# Patient Record
Sex: Female | Born: 1945 | Race: Black or African American | Hispanic: No | Marital: Married | Smoking: Never smoker
Health system: Southern US, Community
[De-identification: ages and names within clinical notes are randomized; demographics above are authoritative.]

## PROBLEM LIST (undated history)

## (undated) DIAGNOSIS — E7800 Pure hypercholesterolemia, unspecified: Secondary | ICD-10-CM

## (undated) DIAGNOSIS — R569 Unspecified convulsions: Secondary | ICD-10-CM

## (undated) DIAGNOSIS — M1990 Unspecified osteoarthritis, unspecified site: Secondary | ICD-10-CM

## (undated) DIAGNOSIS — H269 Unspecified cataract: Secondary | ICD-10-CM

## (undated) DIAGNOSIS — I639 Cerebral infarction, unspecified: Secondary | ICD-10-CM

## (undated) DIAGNOSIS — E119 Type 2 diabetes mellitus without complications: Secondary | ICD-10-CM

## (undated) DIAGNOSIS — I10 Essential (primary) hypertension: Secondary | ICD-10-CM

## (undated) DIAGNOSIS — C801 Malignant (primary) neoplasm, unspecified: Secondary | ICD-10-CM

## (undated) DIAGNOSIS — D649 Anemia, unspecified: Secondary | ICD-10-CM

## (undated) DIAGNOSIS — E559 Vitamin D deficiency, unspecified: Secondary | ICD-10-CM

## (undated) DIAGNOSIS — G4730 Sleep apnea, unspecified: Secondary | ICD-10-CM

## (undated) DIAGNOSIS — R0600 Dyspnea, unspecified: Secondary | ICD-10-CM

## (undated) DIAGNOSIS — J45909 Unspecified asthma, uncomplicated: Secondary | ICD-10-CM

## (undated) DIAGNOSIS — I219 Acute myocardial infarction, unspecified: Secondary | ICD-10-CM

## (undated) DIAGNOSIS — E039 Hypothyroidism, unspecified: Secondary | ICD-10-CM

## (undated) DIAGNOSIS — T7840XA Allergy, unspecified, initial encounter: Secondary | ICD-10-CM

## (undated) DIAGNOSIS — F32A Depression, unspecified: Secondary | ICD-10-CM

## (undated) DIAGNOSIS — F419 Anxiety disorder, unspecified: Secondary | ICD-10-CM

## (undated) HISTORY — DX: Anxiety disorder, unspecified: F41.9

## (undated) HISTORY — DX: Unspecified cataract: H26.9

## (undated) HISTORY — DX: Cerebral infarction, unspecified: I63.9

## (undated) HISTORY — DX: Vitamin D deficiency, unspecified: E55.9

## (undated) HISTORY — DX: Unspecified osteoarthritis, unspecified site: M19.90

## (undated) HISTORY — DX: Sleep apnea, unspecified: G47.30

## (undated) HISTORY — DX: Allergy, unspecified, initial encounter: T78.40XA

## (undated) HISTORY — DX: Depression, unspecified: F32.A

## (undated) HISTORY — DX: Unspecified convulsions: R56.9

## (undated) HISTORY — DX: Pure hypercholesterolemia, unspecified: E78.00

## (undated) HISTORY — PX: HERNIA REPAIR: SHX51

## (undated) HISTORY — PX: ABDOMINAL HYSTERECTOMY: SUR658

## (undated) HISTORY — DX: Essential (primary) hypertension: I10

## (undated) HISTORY — DX: Malignant (primary) neoplasm, unspecified: C80.1

## (undated) HISTORY — DX: Anemia, unspecified: D64.9

## (undated) HISTORY — DX: Acute myocardial infarction, unspecified: I21.9

## (undated) HISTORY — PX: BARIATRIC SURGERY: SHX1103

## (undated) HISTORY — PX: BREAST BIOPSY: SHX20

## (undated) HISTORY — DX: Type 2 diabetes mellitus without complications: E11.9

---

## 1988-05-01 DIAGNOSIS — C801 Malignant (primary) neoplasm, unspecified: Secondary | ICD-10-CM

## 1988-05-01 HISTORY — DX: Malignant (primary) neoplasm, unspecified: C80.1

## 2001-05-08 DIAGNOSIS — F32A Depression, unspecified: Secondary | ICD-10-CM | POA: Insufficient documentation

## 2003-04-28 DIAGNOSIS — E119 Type 2 diabetes mellitus without complications: Secondary | ICD-10-CM | POA: Insufficient documentation

## 2003-05-02 DIAGNOSIS — I219 Acute myocardial infarction, unspecified: Secondary | ICD-10-CM

## 2003-05-02 HISTORY — DX: Acute myocardial infarction, unspecified: I21.9

## 2005-05-01 DIAGNOSIS — I639 Cerebral infarction, unspecified: Secondary | ICD-10-CM

## 2005-05-01 HISTORY — DX: Cerebral infarction, unspecified: I63.9

## 2007-03-20 DIAGNOSIS — I079 Rheumatic tricuspid valve disease, unspecified: Secondary | ICD-10-CM | POA: Insufficient documentation

## 2007-03-20 DIAGNOSIS — I5189 Other ill-defined heart diseases: Secondary | ICD-10-CM | POA: Insufficient documentation

## 2015-06-22 DIAGNOSIS — E2839 Other primary ovarian failure: Secondary | ICD-10-CM | POA: Diagnosis not present

## 2015-06-24 DIAGNOSIS — R0602 Shortness of breath: Secondary | ICD-10-CM | POA: Diagnosis not present

## 2015-06-24 DIAGNOSIS — I10 Essential (primary) hypertension: Secondary | ICD-10-CM | POA: Diagnosis not present

## 2015-06-24 DIAGNOSIS — R9431 Abnormal electrocardiogram [ECG] [EKG]: Secondary | ICD-10-CM | POA: Diagnosis not present

## 2015-06-29 DIAGNOSIS — R921 Mammographic calcification found on diagnostic imaging of breast: Secondary | ICD-10-CM | POA: Diagnosis not present

## 2015-06-29 DIAGNOSIS — Z1231 Encounter for screening mammogram for malignant neoplasm of breast: Secondary | ICD-10-CM | POA: Diagnosis not present

## 2015-07-05 DIAGNOSIS — E119 Type 2 diabetes mellitus without complications: Secondary | ICD-10-CM | POA: Diagnosis not present

## 2015-07-05 DIAGNOSIS — I639 Cerebral infarction, unspecified: Secondary | ICD-10-CM | POA: Diagnosis not present

## 2015-07-05 DIAGNOSIS — I10 Essential (primary) hypertension: Secondary | ICD-10-CM | POA: Diagnosis not present

## 2015-07-07 DIAGNOSIS — R921 Mammographic calcification found on diagnostic imaging of breast: Secondary | ICD-10-CM | POA: Diagnosis not present

## 2015-07-15 DIAGNOSIS — E1165 Type 2 diabetes mellitus with hyperglycemia: Secondary | ICD-10-CM | POA: Diagnosis not present

## 2015-07-15 DIAGNOSIS — G4730 Sleep apnea, unspecified: Secondary | ICD-10-CM | POA: Diagnosis not present

## 2015-07-15 DIAGNOSIS — N183 Chronic kidney disease, stage 3 (moderate): Secondary | ICD-10-CM | POA: Diagnosis not present

## 2015-07-15 DIAGNOSIS — I10 Essential (primary) hypertension: Secondary | ICD-10-CM | POA: Diagnosis not present

## 2015-10-21 DIAGNOSIS — E1165 Type 2 diabetes mellitus with hyperglycemia: Secondary | ICD-10-CM | POA: Diagnosis not present

## 2015-10-21 DIAGNOSIS — G4730 Sleep apnea, unspecified: Secondary | ICD-10-CM | POA: Diagnosis not present

## 2015-10-21 DIAGNOSIS — Z299 Encounter for prophylactic measures, unspecified: Secondary | ICD-10-CM | POA: Diagnosis not present

## 2015-10-21 DIAGNOSIS — I639 Cerebral infarction, unspecified: Secondary | ICD-10-CM | POA: Diagnosis not present

## 2015-10-21 DIAGNOSIS — E785 Hyperlipidemia, unspecified: Secondary | ICD-10-CM | POA: Diagnosis not present

## 2015-10-28 DIAGNOSIS — E119 Type 2 diabetes mellitus without complications: Secondary | ICD-10-CM | POA: Diagnosis not present

## 2015-10-28 DIAGNOSIS — I10 Essential (primary) hypertension: Secondary | ICD-10-CM | POA: Diagnosis not present

## 2015-10-28 DIAGNOSIS — I639 Cerebral infarction, unspecified: Secondary | ICD-10-CM | POA: Diagnosis not present

## 2015-11-08 DIAGNOSIS — E119 Type 2 diabetes mellitus without complications: Secondary | ICD-10-CM | POA: Diagnosis not present

## 2015-11-08 DIAGNOSIS — I639 Cerebral infarction, unspecified: Secondary | ICD-10-CM | POA: Diagnosis not present

## 2015-11-08 DIAGNOSIS — I10 Essential (primary) hypertension: Secondary | ICD-10-CM | POA: Diagnosis not present

## 2015-11-10 ENCOUNTER — Encounter: Payer: Self-pay | Admitting: *Deleted

## 2015-11-11 ENCOUNTER — Encounter: Payer: Self-pay | Admitting: *Deleted

## 2015-11-11 ENCOUNTER — Encounter: Payer: Self-pay | Admitting: Cardiology

## 2015-11-11 ENCOUNTER — Ambulatory Visit (INDEPENDENT_AMBULATORY_CARE_PROVIDER_SITE_OTHER): Payer: Medicare Other | Admitting: Cardiology

## 2015-11-11 VITALS — BP 103/71 | HR 77 | Ht 60.0 in | Wt 228.0 lb

## 2015-11-11 DIAGNOSIS — R0789 Other chest pain: Secondary | ICD-10-CM

## 2015-11-11 DIAGNOSIS — R0602 Shortness of breath: Secondary | ICD-10-CM | POA: Diagnosis not present

## 2015-11-11 DIAGNOSIS — Z136 Encounter for screening for cardiovascular disorders: Secondary | ICD-10-CM | POA: Diagnosis not present

## 2015-11-11 NOTE — Patient Instructions (Signed)
Your physician recommends that you schedule a follow-up appointment in: 6 WEEKS WITH DR BRANCH  Your physician recommends that you continue on your current medications as directed. Please refer to the Current Medication list given to you today.  Your physician has requested that you have an echocardiogram. Echocardiography is a painless test that uses sound waves to create images of your heart. It provides your doctor with information about the size and shape of your heart and how well your heart's chambers and valves are working. This procedure takes approximately one hour. There are no restrictions for this procedure.  Thank you for choosing  [facility]!!   

## 2015-11-11 NOTE — Progress Notes (Signed)
Clinical Summary Jessica Strong is a 70 y.o.female seen today as a new patient, previously seen by [facility] Card[facility] Dr Hamilton Capri.   1. SOB - SOB x 6 months. DOE walking room to room at home. Denies any LE edema.  - can have some occasional chest pain. Started about 6 months ago. Occurs with exertion, aching pain 4/10. Better with rest. Not positional. Increase in frequency since onset, no increase in severity.  - secondhand smoke exposure with husband. +cough at times. Occasional wheezing. Never had PFTs. - echo 12/2012 [facility]: technically difficult. LVEF 55-60%, abnormal diastolic function.  - negative exercise cardiolite in 2016 CAD risk factors: DM2, hyperlipidemia, HTN.       Past Medical History  Diagnosis Date  . Vitamin D deficiency   . Hypertension   . Hypercholesterolemia   . Stroke ([place])   . Sleep apnea     on CPAP 14 mm PS since February 2017     Allergies  Allergen Reactions  . Penicillins Rash     Current Outpatient Prescriptions  Medication Sig Dispense Refill  . clopidogrel (PLAVIX) 75 MG tablet Take 75 mg by mouth daily.    [name] donepezil (ARICEPT) 10 MG tablet Take 10 mg by mouth at bedtime.    . Dulaglutide (TRULICITY) A999333 0000000 SOPN Inject 0.5 mLs into the skin once a week.    . Empagliflozin-Linagliptin (GLYXAMBI) 10-5 MG TABS Take 1 tablet by mouth daily.    [name] EPINEPHrine (EPIPEN 2-PAK) 0.3 mg/0.3 mL IJ SOAJ injection Inject 0.3 mg into the muscle once.    . ergocalciferol (VITAMIN D2) 50000 units capsule Take 50,000 Units by mouth once a week.    . ferrous sulfate 325 (65 FE) MG tablet Take 325 mg by mouth daily with breakfast.    . glimepiride (AMARYL) 4 MG tablet Take 4 mg by mouth 2 (two) times daily.    [name] lisinopril-hydrochlorothiazide (PRINZIDE,ZESTORETIC) 20-12.5 MG tablet Take 1 tablet by mouth daily.    . simvastatin (ZOCOR) 20 MG tablet Take 20 mg by mouth daily.     No current facility-administered medications for this  visit.     No past surgical history on file.   Allergies  Allergen Reactions  . Penicillins Rash      Family History  Problem Relation Age of Onset  . Thyroid cancer Father   . Diabetes Mellitus II Mother      Social History Jessica Strong reports that she has never smoked. She does not have any smokeless tobacco history on file. Jessica Strong has no alcohol history on file.   Review of Systems CONSTITUTIONAL: No weight loss, fever, chills, weakness or fatigue.  HEENT: Eyes: No visual loss, blurred vision, double vision or yellow sclerae.No hearing loss, sneezing, congestion, runny nose or sore throat.  SKIN: No rash or itching.  CARDIOVASCULAR: per HPI RESPIRATORY: No shortness of breath, cough or sputum.  GASTROINTESTINAL: No anorexia, nausea, vomiting or diarrhea. No abdominal pain or blood.  GENITOURINARY: No burning on urination, no polyuria NEUROLOGICAL: No headache, dizziness, syncope, paralysis, ataxia, numbness or tingling in the extremities. No change in bowel or bladder control.  MUSCULOSKELETAL: No muscle, back pain, joint pain or stiffness.  LYMPHATICS: No enlarged nodes. No history of splenectomy.  PSYCHIATRIC: No history of depression or anxiety.  ENDOCRINOLOGIC: No reports of sweating, cold or heat intolerance. No polyuria or polydipsia.  [name]   Physical Examination Filed Vitals:   11/11/15 1441  BP: 103/71  Pulse: 77  Filed Vitals:   11/11/15 1441  Height: 5' (1.524 m)  Weight: 228 lb (103.42 kg)    Gen: resting comfortably, no acute distress HEENT: no scleral icterus, pupils equal round and reactive, no palptable cervical adenopathy,  CV: RRR, no m/r/g, no jvd Resp: Clear to auscultation bilaterally GI: abdomen is soft, non-tender, non-distended, normal bowel sounds, no hepatosplenomegaly MSK: extremities are warm, no edema.  Skin: warm, no rash Neuro:  no focal deficits Psych: appropriate affect      Assessment and Plan  1. Chest pain/SOB -  recent chest pain and SOB - we will repeat echo, last study 3 years ago. Recent negative nuclear stress test - pending echo and progression of symptoms, consider LHC/RHC for ongoing symptoms of chest pain and SOB        [name], M.D.

## 2015-11-13 ENCOUNTER — Encounter: Payer: Self-pay | Admitting: Cardiology

## 2015-11-18 ENCOUNTER — Other Ambulatory Visit: Payer: Self-pay

## 2015-11-18 ENCOUNTER — Ambulatory Visit (INDEPENDENT_AMBULATORY_CARE_PROVIDER_SITE_OTHER): Payer: Medicare Other

## 2015-11-18 DIAGNOSIS — R0602 Shortness of breath: Secondary | ICD-10-CM | POA: Diagnosis not present

## 2015-11-23 ENCOUNTER — Other Ambulatory Visit: Payer: Self-pay | Admitting: *Deleted

## 2015-11-23 ENCOUNTER — Telehealth: Payer: Self-pay | Admitting: *Deleted

## 2015-11-23 DIAGNOSIS — R0602 Shortness of breath: Secondary | ICD-10-CM

## 2015-11-23 NOTE — Telephone Encounter (Signed)
Pt aware. Hasn't had PFT's. Orders placed and will forward to be scheduled.

## 2015-11-23 NOTE — Telephone Encounter (Signed)
-----   Message from [name], MD sent at 11/19/2015  1:50 PM EDT ----- Echo looks good, nothing from a heart standpoint to explain her SOB. We will discuss in detail at our follow up. Has she ever had PFTs before? If not please order, this will check and see if her lungs could be the cause of her symptoms.   Zandra Abts MD

## 2015-12-03 ENCOUNTER — Encounter (HOSPITAL_COMMUNITY): Payer: Medicare Other

## 2015-12-10 ENCOUNTER — Ambulatory Visit (HOSPITAL_COMMUNITY)
Admission: RE | Admit: 2015-12-10 | Discharge: 2015-12-10 | Disposition: A | Discharge status: Home / Self Care | Payer: Medicare Other | Source: Ambulatory Visit | Attending: Cardiology | Admitting: Cardiology

## 2015-12-10 DIAGNOSIS — I639 Cerebral infarction, unspecified: Secondary | ICD-10-CM | POA: Diagnosis not present

## 2015-12-10 DIAGNOSIS — R0602 Shortness of breath: Secondary | ICD-10-CM | POA: Diagnosis not present

## 2015-12-10 DIAGNOSIS — E119 Type 2 diabetes mellitus without complications: Secondary | ICD-10-CM | POA: Diagnosis not present

## 2015-12-10 DIAGNOSIS — I10 Essential (primary) hypertension: Secondary | ICD-10-CM | POA: Diagnosis not present

## 2015-12-10 MED ORDER — ALBUTEROL SULFATE (2.5 MG/3ML) 0.083% IN NEBU
2.5000 mg | INHALATION_SOLUTION | Freq: Once | RESPIRATORY_TRACT | Status: AC
  Administered 2015-12-10: 2.5 mg via RESPIRATORY_TRACT

## 2015-12-11 LAB — PULMONARY FUNCTION TEST
DL/VA % PRED: 100 %
DL/VA: 4.28 ml/min/mmHg/L
DLCO unc % pred: 80 %
DLCO unc: 15.17 ml/min/mmHg
FEF 25-75 Post: 2.87 L/sec
FEF 25-75 Pre: 2.34 L/sec
FEF2575-%CHANGE-POST: 23 %
FEF2575-%PRED-PRE: 158 %
FEF2575-%Pred-Post: 195 %
FEV1-%CHANGE-POST: 5 %
FEV1-%Pred-Post: 141 %
FEV1-%Pred-Pre: 134 %
FEV1-PRE: 2.05 L
FEV1-Post: 2.17 L
FEV1FVC-%CHANGE-POST: 2 %
FEV1FVC-%Pred-Pre: 107 %
FEV6-%Change-Post: 3 %
FEV6-%PRED-PRE: 129 %
FEV6-%Pred-Post: 133 %
FEV6-PRE: 2.44 L
FEV6-Post: 2.53 L
FEV6FVC-%Change-Post: 0 %
FEV6FVC-%Pred-Post: 105 %
FEV6FVC-%Pred-Pre: 105 %
FVC-%CHANGE-POST: 3 %
FVC-%PRED-PRE: 123 %
FVC-%Pred-Post: 127 %
FVC-POST: 2.53 L
FVC-PRE: 2.44 L
POST FEV6/FVC RATIO: 100 %
PRE FEV1/FVC RATIO: 84 %
PRE FEV6/FVC RATIO: 100 %
Post FEV1/FVC ratio: 86 %
RV % pred: 78 %
RV: 1.53 L
TLC % pred: 92 %
TLC: 4.13 L

## 2015-12-14 ENCOUNTER — Telehealth: Payer: Self-pay | Admitting: *Deleted

## 2015-12-14 NOTE — Telephone Encounter (Signed)
Pt aware, confirmed 8/22 appt. Routed to pcp

## 2015-12-14 NOTE — Telephone Encounter (Signed)
-----   Message from [name], MD sent at 12/13/2015 10:36 AM EDT ----- Breathing tests look good. We will discuss at our upcoming f/u in detail   Zandra Abts MD

## 2015-12-21 ENCOUNTER — Ambulatory Visit (INDEPENDENT_AMBULATORY_CARE_PROVIDER_SITE_OTHER): Payer: Medicare Other | Admitting: Cardiology

## 2015-12-21 ENCOUNTER — Encounter: Payer: Self-pay | Admitting: Cardiology

## 2015-12-21 ENCOUNTER — Telehealth: Payer: Self-pay | Admitting: Cardiology

## 2015-12-21 ENCOUNTER — Encounter: Payer: Self-pay | Admitting: *Deleted

## 2015-12-21 VITALS — BP 109/72 | HR 65 | Ht 60.0 in | Wt 224.8 lb

## 2015-12-21 DIAGNOSIS — R0789 Other chest pain: Secondary | ICD-10-CM | POA: Diagnosis not present

## 2015-12-21 DIAGNOSIS — R0602 Shortness of breath: Secondary | ICD-10-CM | POA: Diagnosis not present

## 2015-12-21 NOTE — Telephone Encounter (Signed)
[facility] 8/25 @ 10:30 VARANASI  Checking percert

## 2015-12-21 NOTE — Progress Notes (Signed)
Clinical Summary Jessica Strong is a 70 y.o.female seen today for follow up of the following medical problems.   1. SOB - SOB x 6 months. DOE walking room to room at home. Denies any LE edema.  - can have some occasional chest pain. Started about 6 months ago. Occurs with exertion, aching pain 4/10. Better with rest. Not positional. Increase in frequency since onset, no increase in severity.  - secondhand smoke exposure with husband. +cough at times. Occasional wheezing. Since last visit she had normal PFTs.  - echo 12/2012 [facility]: technically difficult. LVEF 55-60%, abnormal diastolic function.  - negative exercise cardiolite in 2016 - 10/2015 echo LVEF 123456, grade I diastolic dysfunction CAD risk factors: DM2, hyperlipidemia, HTN.   - symptoms unchanged since last visit.    Past Medical History:  Diagnosis Date  . Hypercholesterolemia   . Hypertension   . Sleep apnea    on CPAP 14 mm PS since February 2017  . Stroke ([place])   . Vitamin D deficiency      Allergies  Allergen Reactions  . Penicillins Rash     Current Outpatient Prescriptions  Medication Sig Dispense Refill  . clopidogrel (PLAVIX) 75 MG tablet Take 75 mg by mouth daily.    [name] donepezil (ARICEPT) 10 MG tablet Take 10 mg by mouth at bedtime.    . Dulaglutide (TRULICITY) A999333 0000000 SOPN Inject 0.5 mLs into the skin once a week.    . Empagliflozin-Linagliptin (GLYXAMBI) 10-5 MG TABS Take 1 tablet by mouth daily.    [name] EPINEPHrine (EPIPEN 2-PAK) 0.3 mg/0.3 mL IJ SOAJ injection Inject 0.3 mg into the muscle once.    . ergocalciferol (VITAMIN D2) 50000 units capsule Take 50,000 Units by mouth once a week.    . ferrous sulfate 325 (65 FE) MG tablet Take 325 mg by mouth daily with breakfast.    . glimepiride (AMARYL) 4 MG tablet Take 4 mg by mouth 2 (two) times daily.    [name] lisinopril-hydrochlorothiazide (PRINZIDE,ZESTORETIC) 20-12.5 MG tablet Take 1 tablet by mouth daily.    . metFORMIN (GLUCOPHAGE)  1000 MG tablet Take 1 tablet by mouth 2 (two) times daily.    . Potassium 95 MG TABS Take 1 tablet by mouth daily.    . simvastatin (ZOCOR) 20 MG tablet Take 20 mg by mouth daily.     No current facility-administered medications for this visit.      No past surgical history on file.   Allergies  Allergen Reactions  . Penicillins Rash      Family History  Problem Relation Age of Onset  . Thyroid cancer Father   . Diabetes Mellitus II Mother      Social History Ms. Burnes reports that she has never smoked. She has never used smokeless tobacco. Ms. Wolverton has no alcohol history on file.   Review of Systems CONSTITUTIONAL: No weight loss, fever, chills, weakness or fatigue.  HEENT: Eyes: No visual loss, blurred vision, double vision or yellow sclerae.No hearing loss, sneezing, congestion, runny nose or sore throat.  SKIN: No rash or itching.  CARDIOVASCULAR: per HPI RESPIRATORY: per HPI GASTROINTESTINAL: No anorexia, nausea, vomiting or diarrhea. No abdominal pain or blood.  GENITOURINARY: No burning on urination, no polyuria NEUROLOGICAL: No headache, dizziness, syncope, paralysis, ataxia, numbness or tingling in the extremities. No change in bowel or bladder control.  MUSCULOSKELETAL: No muscle, back pain, joint pain or stiffness.  LYMPHATICS: No enlarged nodes. No history of splenectomy.  PSYCHIATRIC:  No history of depression or anxiety.  ENDOCRINOLOGIC: No reports of sweating, cold or heat intolerance. No polyuria or polydipsia.  [name]   Physical Examination Vitals:   12/21/15 1458  BP: 109/72  Pulse: 65   Vitals:   12/21/15 1458  Weight: 224 lb 12.8 oz (102 kg)  Height: 5' (1.524 m)    Gen: resting comfortably, no acute distress HEENT: no scleral icterus, pupils equal round and reactive, no palptable cervical adenopathy,  CV: RRR, no m/r/g, no jvd Resp: Clear to auscultation bilaterally GI: abdomen is soft, non-tender, non-distended, normal bowel sounds, no  hepatosplenomegaly MSK: extremities are warm, no edema.  Skin: warm, no rash Neuro:  no focal deficits Psych: appropriate affect   Diagnostic Studies 10/2015 echo Study Conclusions  - Left ventricle: The cavity size was normal. Systolic function was   normal. The estimated ejection fraction was in the range of 60%   to 65%. Mild concentric and moderate focal basal septal   hypertrophy. Wall motion was normal; there were no regional wall   motion abnormalities. Doppler parameters are consistent with   abnormal left ventricular relaxation (grade 1 diastolic   dysfunction). - Aortic valve: Trileaflet; mildly thickened leaflets.    Assessment and Plan   1. Chest pain/SOB - significant progressing symptoms. No clear etiology based on previous echos, nuclear stress test. Recent normal PFTs.  - given her ongoing symptoms and multiple CAD risk factors, we will arrange a definitive LHC to evaluate for CAD as the cause of her chest pain, and also ask for a RHC to evaluate filling pressures to evaluate for cardiac etiology of her SOB   F/u 6 weeks.    [name], M.D.   I have reviewed the risks, indications, and alternatives to cardiac catheterization, possible angioplasty, and stenting with the patient and her daughter today. Risks include but are not limited to bleeding, infection, vascular injury, stroke, myocardial infection, arrhythmia, kidney injury, radiation-related injury in the case of prolonged fluoroscopy use, emergency cardiac surgery, and death. The patient understands the risks of serious complication is 1-2 in 123XX123 with diagnostic cardiac cath and 1-2% or less with angioplasty/stenting.

## 2015-12-21 NOTE — Patient Instructions (Addendum)
Your physician recommends that you schedule a follow-up appointment in: 6 WEEKS WITH DR. BRANCH  Your physician recommends that you continue on your current medications as directed. Please refer to the Current Medication list given to you today.  Your physician has requested that you have a cardiac catheterization. Cardiac catheterization is used to diagnose and/or treat various heart conditions. Doctors may recommend this procedure for a number of different reasons. The most common reason is to evaluate chest pain. Chest pain can be a symptom of coronary artery disease (CAD), and cardiac catheterization can show whether plaque is narrowing or blocking your heart's arteries. This procedure is also used to evaluate the valves, as well as measure the blood flow and oxygen levels in different parts of your heart. For further information please visit www.cardiosmart.org. Please follow instruction sheet, as given.  Thank you for choosing [facility]!!    

## 2015-12-24 ENCOUNTER — Encounter (HOSPITAL_COMMUNITY)
Admission: RE | Disposition: A | Discharge status: Home / Self Care | Payer: Self-pay | Source: Ambulatory Visit | Attending: Interventional Cardiology

## 2015-12-24 ENCOUNTER — Ambulatory Visit (HOSPITAL_COMMUNITY)
Admission: RE | Admit: 2015-12-24 | Discharge: 2015-12-24 | Disposition: A | Discharge status: Home / Self Care | Payer: Medicare Other | Source: Ambulatory Visit | Attending: Interventional Cardiology | Admitting: Interventional Cardiology

## 2015-12-24 DIAGNOSIS — I10 Essential (primary) hypertension: Secondary | ICD-10-CM | POA: Insufficient documentation

## 2015-12-24 DIAGNOSIS — R0602 Shortness of breath: Secondary | ICD-10-CM | POA: Insufficient documentation

## 2015-12-24 DIAGNOSIS — G4733 Obstructive sleep apnea (adult) (pediatric): Secondary | ICD-10-CM | POA: Diagnosis not present

## 2015-12-24 DIAGNOSIS — Z7984 Long term (current) use of oral hypoglycemic drugs: Secondary | ICD-10-CM | POA: Insufficient documentation

## 2015-12-24 DIAGNOSIS — E119 Type 2 diabetes mellitus without complications: Secondary | ICD-10-CM | POA: Insufficient documentation

## 2015-12-24 DIAGNOSIS — Z7902 Long term (current) use of antithrombotics/antiplatelets: Secondary | ICD-10-CM | POA: Diagnosis not present

## 2015-12-24 DIAGNOSIS — R0609 Other forms of dyspnea: Secondary | ICD-10-CM

## 2015-12-24 DIAGNOSIS — Z7722 Contact with and (suspected) exposure to environmental tobacco smoke (acute) (chronic): Secondary | ICD-10-CM | POA: Diagnosis not present

## 2015-12-24 DIAGNOSIS — Z833 Family history of diabetes mellitus: Secondary | ICD-10-CM | POA: Insufficient documentation

## 2015-12-24 DIAGNOSIS — Z8673 Personal history of transient ischemic attack (TIA), and cerebral infarction without residual deficits: Secondary | ICD-10-CM | POA: Insufficient documentation

## 2015-12-24 DIAGNOSIS — R0600 Dyspnea, unspecified: Secondary | ICD-10-CM

## 2015-12-24 DIAGNOSIS — E7800 Pure hypercholesterolemia, unspecified: Secondary | ICD-10-CM | POA: Diagnosis not present

## 2015-12-24 DIAGNOSIS — Z79899 Other long term (current) drug therapy: Secondary | ICD-10-CM | POA: Insufficient documentation

## 2015-12-24 DIAGNOSIS — R079 Chest pain, unspecified: Secondary | ICD-10-CM | POA: Diagnosis not present

## 2015-12-24 DIAGNOSIS — Z880 Allergy status to penicillin: Secondary | ICD-10-CM | POA: Diagnosis not present

## 2015-12-24 HISTORY — PX: CARDIAC CATHETERIZATION: SHX172

## 2015-12-24 LAB — POCT I-STAT 3, VENOUS BLOOD GAS (G3P V)
Acid-base deficit: 2 mmol/L (ref 0.0–2.0)
Acid-base deficit: 3 mmol/L — ABNORMAL HIGH (ref 0.0–2.0)
BICARBONATE: 21.9 meq/L (ref 20.0–24.0)
Bicarbonate: 22.8 mEq/L (ref 20.0–24.0)
O2 Saturation: 48 %
O2 Saturation: 55 %
PCO2 VEN: 39.7 mmHg — AB (ref 45.0–50.0)
PCO2 VEN: 39.9 mmHg — AB (ref 45.0–50.0)
PH VEN: 7.365 — AB (ref 7.250–7.300)
TCO2: 23 mmol/L (ref 0–100)
TCO2: 24 mmol/L (ref 0–100)
pH, Ven: 7.35 — ABNORMAL HIGH (ref 7.250–7.300)
pO2, Ven: 27 mmHg — ABNORMAL LOW (ref 31.0–45.0)
pO2, Ven: 30 mmHg — ABNORMAL LOW (ref 31.0–45.0)

## 2015-12-24 LAB — POCT I-STAT 3, ART BLOOD GAS (G3+)
Acid-base deficit: 4 mmol/L — ABNORMAL HIGH (ref 0.0–2.0)
BICARBONATE: 21.2 meq/L (ref 20.0–24.0)
O2 Saturation: 92 %
PH ART: 7.373 (ref 7.350–7.450)
PO2 ART: 65 mmHg — AB (ref 80.0–100.0)
TCO2: 22 mmol/L (ref 0–100)
pCO2 arterial: 36.4 mmHg (ref 35.0–45.0)

## 2015-12-24 LAB — CBC
HCT: 38.9 % (ref 36.0–46.0)
HEMOGLOBIN: 12 g/dL (ref 12.0–15.0)
MCH: 26 pg (ref 26.0–34.0)
MCHC: 30.8 g/dL (ref 30.0–36.0)
MCV: 84.4 fL (ref 78.0–100.0)
Platelets: 316 10*3/uL (ref 150–400)
RBC: 4.61 MIL/uL (ref 3.87–5.11)
RDW: 14.8 % (ref 11.5–15.5)
WBC: 7.6 10*3/uL (ref 4.0–10.5)

## 2015-12-24 LAB — GLUCOSE, CAPILLARY: GLUCOSE-CAPILLARY: 106 mg/dL — AB (ref 65–99)

## 2015-12-24 LAB — BASIC METABOLIC PANEL
ANION GAP: 11 (ref 5–15)
BUN: 20 mg/dL (ref 6–20)
CALCIUM: 9.5 mg/dL (ref 8.9–10.3)
CO2: 23 mmol/L (ref 22–32)
Chloride: 105 mmol/L (ref 101–111)
Creatinine, Ser: 1.54 mg/dL — ABNORMAL HIGH (ref 0.44–1.00)
GFR calc Af Amer: 39 mL/min — ABNORMAL LOW (ref >60)
GFR, EST NON AFRICAN AMERICAN: 34 mL/min — AB (ref >60)
GLUCOSE: 149 mg/dL — AB (ref 65–99)
Potassium: 3.8 mmol/L (ref 3.5–5.1)
SODIUM: 139 mmol/L (ref 135–145)

## 2015-12-24 LAB — PROTIME-INR
INR: 0.97
PROTHROMBIN TIME: 12.9 s (ref 11.4–15.2)

## 2015-12-24 LAB — POCT ACTIVATED CLOTTING TIME: Activated Clotting Time: 175 seconds

## 2015-12-24 SURGERY — RIGHT/LEFT HEART CATH AND CORONARY ANGIOGRAPHY
Anesthesia: LOCAL

## 2015-12-24 MED ORDER — SODIUM CHLORIDE 0.9 % WEIGHT BASED INFUSION
1.0000 mL/kg/h | INTRAVENOUS | Status: DC
  Administered 2015-12-24: 250 mL via INTRAVENOUS

## 2015-12-24 MED ORDER — SODIUM CHLORIDE 0.9 % IV SOLN: 250.0000 mL | INTRAVENOUS | Status: DC | PRN

## 2015-12-24 MED ORDER — HEPARIN SODIUM (PORCINE) 1000 UNIT/ML IJ SOLN
INTRAMUSCULAR | Status: DC | PRN
  Administered 2015-12-24: 5000 [IU] via INTRAVENOUS

## 2015-12-24 MED ORDER — SODIUM CHLORIDE 0.9% FLUSH: 3.0000 mL | Freq: Two times a day (BID) | INTRAVENOUS | Status: DC

## 2015-12-24 MED ORDER — MIDAZOLAM HCL 2 MG/2ML IJ SOLN
INTRAMUSCULAR | Status: AC
  Filled 2015-12-24: qty 2

## 2015-12-24 MED ORDER — SODIUM CHLORIDE 0.9% FLUSH: 3.0000 mL | INTRAVENOUS | Status: DC | PRN

## 2015-12-24 MED ORDER — HEPARIN (PORCINE) IN NACL 2-0.9 UNIT/ML-% IJ SOLN
INTRAMUSCULAR | Status: DC | PRN
  Administered 2015-12-24: 1500 mL

## 2015-12-24 MED ORDER — MIDAZOLAM HCL 2 MG/2ML IJ SOLN
INTRAMUSCULAR | Status: DC | PRN
  Administered 2015-12-24: 2 mg via INTRAVENOUS

## 2015-12-24 MED ORDER — IOPAMIDOL (ISOVUE-370) INJECTION 76%
INTRAVENOUS | Status: DC | PRN
  Administered 2015-12-24: 40 mL via INTRA_ARTERIAL

## 2015-12-24 MED ORDER — VERAPAMIL HCL 2.5 MG/ML IV SOLN
INTRAVENOUS | Status: DC | PRN
  Administered 2015-12-24: 10 mL via INTRA_ARTERIAL

## 2015-12-24 MED ORDER — FENTANYL CITRATE (PF) 100 MCG/2ML IJ SOLN
INTRAMUSCULAR | Status: AC
  Filled 2015-12-24: qty 2

## 2015-12-24 MED ORDER — FENTANYL CITRATE (PF) 100 MCG/2ML IJ SOLN
INTRAMUSCULAR | Status: DC | PRN
  Administered 2015-12-24: 25 ug via INTRAVENOUS

## 2015-12-24 MED ORDER — HEPARIN (PORCINE) IN NACL 2-0.9 UNIT/ML-% IJ SOLN
INTRAMUSCULAR | Status: AC
  Filled 2015-12-24: qty 1500

## 2015-12-24 MED ORDER — METFORMIN HCL 1000 MG PO TABS: 1000.0000 mg | ORAL_TABLET | Freq: Two times a day (BID) | ORAL | Status: DC

## 2015-12-24 MED ORDER — VERAPAMIL HCL 2.5 MG/ML IV SOLN
INTRAVENOUS | Status: AC
  Filled 2015-12-24: qty 2

## 2015-12-24 MED ORDER — IOPAMIDOL (ISOVUE-370) INJECTION 76%
INTRAVENOUS | Status: AC
  Filled 2015-12-24: qty 100

## 2015-12-24 MED ORDER — ASPIRIN 81 MG PO CHEW
CHEWABLE_TABLET | ORAL | Status: AC
  Administered 2015-12-24: 81 mg via ORAL
  Filled 2015-12-24: qty 1

## 2015-12-24 MED ORDER — HEPARIN SODIUM (PORCINE) 1000 UNIT/ML IJ SOLN
INTRAMUSCULAR | Status: AC
  Filled 2015-12-24: qty 1

## 2015-12-24 MED ORDER — LIDOCAINE HCL (PF) 1 % IJ SOLN
INTRAMUSCULAR | Status: AC
  Filled 2015-12-24: qty 30

## 2015-12-24 MED ORDER — SODIUM CHLORIDE 0.9 % WEIGHT BASED INFUSION
3.0000 mL/kg/h | INTRAVENOUS | Status: DC
  Administered 2015-12-24: 3 mL/kg/h via INTRAVENOUS

## 2015-12-24 MED ORDER — ASPIRIN 81 MG PO CHEW
81.0000 mg | CHEWABLE_TABLET | ORAL | Status: AC
  Administered 2015-12-24: 81 mg via ORAL

## 2015-12-24 MED ORDER — LIDOCAINE HCL (PF) 1 % IJ SOLN
INTRAMUSCULAR | Status: DC | PRN
Start: 2015-12-24 — End: 2015-12-24
  Administered 2015-12-24: 2 mL
  Administered 2015-12-24: 18 mL

## 2015-12-24 MED ORDER — SODIUM CHLORIDE 0.9 % WEIGHT BASED INFUSION
1.0000 mL/kg/h | INTRAVENOUS | Status: DC
  Administered 2015-12-24: 1 mL/kg/h via INTRAVENOUS

## 2015-12-24 SURGICAL SUPPLY — 12 items
CATH IMPULSE 5F ANG/FL3.5 (CATHETERS) ×2 IMPLANT
CATH SWAN GANZ 7F STRAIGHT (CATHETERS) ×2 IMPLANT
DEVICE RAD COMP TR BAND LRG (VASCULAR PRODUCTS) ×2 IMPLANT
GLIDESHEATH SLEND SS 6F .021 (SHEATH) ×2 IMPLANT
KIT HEART LEFT (KITS) ×2 IMPLANT
PACK CARDIAC CATHETERIZATION (CUSTOM PROCEDURE TRAY) ×2 IMPLANT
SHEATH FAST CATH BRACH 5F 5CM (SHEATH) IMPLANT
SHEATH PINNACLE 7F 10CM (SHEATH) ×2 IMPLANT
TRANSDUCER W/STOPCOCK (MISCELLANEOUS) ×2 IMPLANT
TUBING CIL FLEX 10 FLL-RA (TUBING) ×2 IMPLANT
WIRE HI TORQ VERSACORE-J 145CM (WIRE) ×2 IMPLANT
WIRE SAFE-T 1.5MM-J .035X260CM (WIRE) ×2 IMPLANT

## 2015-12-24 NOTE — Progress Notes (Signed)
Site area: Right groin a 7 french venous sheath was removed  Site Prior to Removal:  Level 0  Pressure Applied For 15 MINUTES    Minutes Beginning at 1215p  Manual:   Yes.    Patient Status During Pull:  stable  Post Pull Groin Site:  Level 0  Post Pull Instructions Given:  Yes.    Post Pull Pulses Present:  Yes.    Dressing Applied:  Yes.    Comments:  VS remain stable during sheath pull

## 2015-12-24 NOTE — Discharge Instructions (Signed)
Radial Site Care °Refer to this sheet in the next few weeks. These instructions provide you with information about caring for yourself after your procedure. Your health care provider may also give you more specific instructions. Your treatment has been planned according to current medical practices, but problems sometimes occur. Call your health care provider if you have any problems or questions after your procedure. °WHAT TO EXPECT AFTER THE PROCEDURE °After your procedure, it is typical to have the following: °· Bruising at the radial site that usually fades within 1-2 weeks. °· Blood collecting in the tissue (hematoma) that may be painful to the touch. It should usually decrease in size and tenderness within 1-2 weeks. °HOME CARE INSTRUCTIONS °· Take medicines only as directed by your health care provider. °· You may shower 24-48 hours after the procedure or as directed by your health care provider. Remove the bandage (dressing) and gently wash the site with plain soap and water. Pat the area dry with a clean towel. Do not rub the site, because this may cause bleeding. °· Do not take baths, swim, or use a hot tub until your health care provider approves. °· Check your insertion site every day for redness, swelling, or drainage. °· Do not apply powder or lotion to the site. °· Do not flex or bend the affected arm for 24 hours or as directed by your health care provider. °· Do not push or pull heavy objects with the affected arm for 24 hours or as directed by your health care provider. °· Do not lift over 10 lb (4.5 kg) for 5 days after your procedure or as directed by your health care provider. °· Ask your health care provider when it is okay to: °¨ Return to work or school. °¨ Resume usual physical activities or sports. °¨ Resume sexual activity. °· Do not drive home if you are discharged the same day as the procedure. Have someone else drive you. °· You may drive 24 hours after the procedure unless otherwise  instructed by your health care provider. °· Do not operate machinery or power tools for 24 hours after the procedure. °· If your procedure was done as an outpatient procedure, which means that you went home the same day as your procedure, a responsible adult should be with you for the first 24 hours after you arrive home. °· Keep all follow-up visits as directed by your health care provider. This is important. °SEEK MEDICAL CARE IF: °· You have a fever. °· You have chills. °· You have increased bleeding from the radial site. Hold pressure on the site. °SEEK IMMEDIATE MEDICAL CARE IF: °· You have unusual pain at the radial site. °· You have redness, warmth, or swelling at the radial site. °· You have drainage (other than a small amount of blood on the dressing) from the radial site. °· The radial site is bleeding, and the bleeding does not stop after 30 minutes of holding steady pressure on the site. °· Your arm or hand becomes pale, cool, tingly, or numb. °  °This information is not intended to replace advice given to you by your health care provider. Make sure you discuss any questions you have with your health care provider. °  °Document Released: 05/20/2010 Document Revised: 05/08/2014 Document Reviewed: 11/03/2013 °Elsevier Interactive Patient Education ©2016 [employer] °Venogram, Care After °Refer to this sheet in the next few weeks. These instructions provide you with information on caring for yourself after your procedure. Your health care provider   may also give you more specific instructions. Your treatment has been planned according to current medical practices, but problems sometimes occur. Call your health care provider if you have any problems or questions after your procedure. °WHAT TO EXPECT AFTER THE PROCEDURE °After your procedure, it is typical to have the following sensations: °· Mild discomfort at the catheter insertion site. °HOME CARE INSTRUCTIONS  °· Take all medicines exactly as  directed. °· Follow any prescribed diet. °· Follow instructions regarding both rest and physical activity. °· Drink more fluids for the first several days after the procedure in order to help flush dye from your kidneys. °SEEK MEDICAL CARE IF: °· You develop a rash. °· You have fever not controlled by medicine. °SEEK IMMEDIATE MEDICAL CARE IF: °· There is pain, drainage, bleeding, redness, swelling, warmth or a red streak at the site of the IV tube. °· The extremity where your IV tube was placed becomes discolored, numb, or cool. °· You have difficulty breathing or shortness of breath. °· You develop chest pain. °· You have excessive dizziness or fainting. °  °This information is not intended to replace advice given to you by your health care provider. Make sure you discuss any questions you have with your health care provider. °  °Document Released: 02/05/2013 Document Revised: 04/22/2013 Document Reviewed: 02/05/2013 °Elsevier Interactive Patient Education ©2016 [employer]vier Inc. ° °

## 2015-12-24 NOTE — Progress Notes (Signed)
Spoke with Dr Irish Lack and he would like pt to start back on Plavix this evening.  Instructed pt of the same with stated understanding

## 2015-12-24 NOTE — H&P (View-Only) (Signed)
Clinical Summary Jessica Strong is a 70 y.o.female seen today for follow up of the following medical problems.   1. SOB - SOB x 6 months. DOE walking room to room at home. Denies any LE edema.  - can have some occasional chest pain. Started about 6 months ago. Occurs with exertion, aching pain 4/10. Better with rest. Not positional. Increase in frequency since onset, no increase in severity.  - secondhand smoke exposure with husband. +cough at times. Occasional wheezing. Since last visit she had normal PFTs.  - echo 12/2012 [facility]: technically difficult. LVEF 55-60%, abnormal diastolic function.  - negative exercise cardiolite in 2016 - 10/2015 echo LVEF 123456, grade I diastolic dysfunction CAD risk factors: DM2, hyperlipidemia, HTN.   - symptoms unchanged since last visit.    Past Medical History:  Diagnosis Date  . Hypercholesterolemia   . Hypertension   . Sleep apnea    on CPAP 14 mm PS since February 2017  . Stroke ([place])   . Vitamin D deficiency      Allergies  Allergen Reactions  . Penicillins Rash     Current Outpatient Prescriptions  Medication Sig Dispense Refill  . clopidogrel (PLAVIX) 75 MG tablet Take 75 mg by mouth daily.    [name] donepezil (ARICEPT) 10 MG tablet Take 10 mg by mouth at bedtime.    . Dulaglutide (TRULICITY) A999333 0000000 SOPN Inject 0.5 mLs into the skin once a week.    . Empagliflozin-Linagliptin (GLYXAMBI) 10-5 MG TABS Take 1 tablet by mouth daily.    [name] EPINEPHrine (EPIPEN 2-PAK) 0.3 mg/0.3 mL IJ SOAJ injection Inject 0.3 mg into the muscle once.    . ergocalciferol (VITAMIN D2) 50000 units capsule Take 50,000 Units by mouth once a week.    . ferrous sulfate 325 (65 FE) MG tablet Take 325 mg by mouth daily with breakfast.    . glimepiride (AMARYL) 4 MG tablet Take 4 mg by mouth 2 (two) times daily.    [name] lisinopril-hydrochlorothiazide (PRINZIDE,ZESTORETIC) 20-12.5 MG tablet Take 1 tablet by mouth daily.    . metFORMIN (GLUCOPHAGE)  1000 MG tablet Take 1 tablet by mouth 2 (two) times daily.    . Potassium 95 MG TABS Take 1 tablet by mouth daily.    . simvastatin (ZOCOR) 20 MG tablet Take 20 mg by mouth daily.     No current facility-administered medications for this visit.      No past surgical history on file.   Allergies  Allergen Reactions  . Penicillins Rash      Family History  Problem Relation Age of Onset  . Thyroid cancer Father   . Diabetes Mellitus II Mother      Social History [name] reports that she has never smoked. She has never used smokeless tobacco. [name] has no alcohol history on file.   Review of Systems CONSTITUTIONAL: No weight loss, fever, chills, weakness or fatigue.  HEENT: Eyes: No visual loss, blurred vision, double vision or yellow sclerae.No hearing loss, sneezing, congestion, runny nose or sore throat.  SKIN: No rash or itching.  CARDIOVASCULAR: per HPI RESPIRATORY: per HPI GASTROINTESTINAL: No anorexia, nausea, vomiting or diarrhea. No abdominal pain or blood.  GENITOURINARY: No burning on urination, no polyuria NEUROLOGICAL: No headache, dizziness, syncope, paralysis, ataxia, numbness or tingling in the extremities. No change in bowel or bladder control.  MUSCULOSKELETAL: No muscle, back pain, joint pain or stiffness.  LYMPHATICS: No enlarged nodes. No history of splenectomy.  PSYCHIATRIC:  No history of depression or anxiety.  ENDOCRINOLOGIC: No reports of sweating, cold or heat intolerance. No polyuria or polydipsia.  [name]   Physical Examination Vitals:   12/21/15 1458  BP: 109/72  Pulse: 65   Vitals:   12/21/15 1458  Weight: 224 lb 12.8 oz (102 kg)  Height: 5' (1.524 m)    Gen: resting comfortably, no acute distress HEENT: no scleral icterus, pupils equal round and reactive, no palptable cervical adenopathy,  CV: RRR, no m/r/g, no jvd Resp: Clear to auscultation bilaterally GI: abdomen is soft, non-tender, non-distended, normal bowel sounds, no  hepatosplenomegaly MSK: extremities are warm, no edema.  Skin: warm, no rash Neuro:  no focal deficits Psych: appropriate affect   Diagnostic Studies 10/2015 echo Study Conclusions  - Left ventricle: The cavity size was normal. Systolic function was   normal. The estimated ejection fraction was in the range of 60%   to 65%. Mild concentric and moderate focal basal septal   hypertrophy. Wall motion was normal; there were no regional wall   motion abnormalities. Doppler parameters are consistent with   abnormal left ventricular relaxation (grade 1 diastolic   dysfunction). - Aortic valve: Trileaflet; mildly thickened leaflets.    Assessment and Plan   1. Chest pain/SOB - significant progressing symptoms. No clear etiology based on previous echos, nuclear stress test. Recent normal PFTs.  - given her ongoing symptoms and multiple CAD risk factors, we will arrange a definitive LHC to evaluate for CAD as the cause of her chest pain, and also ask for a RHC to evaluate filling pressures to evaluate for cardiac etiology of her SOB   F/u 6 weeks.    [name], M.D.   I have reviewed the risks, indications, and alternatives to cardiac catheterization, possible angioplasty, and stenting with the patient and her daughter today. Risks include but are not limited to bleeding, infection, vascular injury, stroke, myocardial infection, arrhythmia, kidney injury, radiation-related injury in the case of prolonged fluoroscopy use, emergency cardiac surgery, and death. The patient understands the risks of serious complication is 1-2 in 123XX123 with diagnostic cardiac cath and 1-2% or less with angioplasty/stenting.

## 2015-12-24 NOTE — Interval H&P Note (Signed)
Cath Lab Visit (complete for each Cath Lab visit)  Clinical Evaluation Leading to the Procedure:   ACS: No.  Non-ACS:    Anginal Classification: CCS III  Anti-ischemic medical therapy: No Therapy  Non-Invasive Test Results: Low-risk stress test findings: cardiac mortality <1%/year  Prior CABG: No previous CABG      History and Physical Interval Note:  12/24/2015 11:01 AM  Jessica Strong  has presented today for surgery, with the diagnosis of cp  The various methods of treatment have been discussed with the patient and family. After consideration of risks, benefits and other options for treatment, the patient has consented to  Procedure(s): Right/Left Heart Cath and Coronary Angiography (N/A) as a surgical intervention .  The patient's history has been reviewed, patient examined, no change in status, stable for surgery.  I have reviewed the patient's chart and labs.  Questions were answered to the patient's satisfaction.     Larae Grooms

## 2015-12-27 ENCOUNTER — Encounter (HOSPITAL_COMMUNITY): Payer: Self-pay | Admitting: Interventional Cardiology

## 2016-01-28 ENCOUNTER — Encounter: Payer: Self-pay | Admitting: *Deleted

## 2016-01-31 ENCOUNTER — Encounter: Payer: Self-pay | Admitting: Cardiology

## 2016-01-31 ENCOUNTER — Ambulatory Visit (INDEPENDENT_AMBULATORY_CARE_PROVIDER_SITE_OTHER): Payer: Medicare Other | Admitting: Cardiology

## 2016-01-31 VITALS — BP 115/74 | HR 98 | Ht 60.0 in | Wt 223.0 lb

## 2016-01-31 DIAGNOSIS — R0789 Other chest pain: Secondary | ICD-10-CM | POA: Diagnosis not present

## 2016-01-31 DIAGNOSIS — R0602 Shortness of breath: Secondary | ICD-10-CM | POA: Diagnosis not present

## 2016-01-31 NOTE — Patient Instructions (Signed)
Your physician recommends that you schedule a follow-up appointment AS NEEDED WITH DR. BRANCH  Your physician recommends that you continue on your current medications as directed. Please refer to the Current Medication list given to you today.  Thank you for choosing [facility]!!   

## 2016-01-31 NOTE — Progress Notes (Signed)
Clinical Summary Jessica Strong is a 70 y.o.female seen today for follow up of the following medical problems.    1. SOB - SOB x 6 months. DOE walking room to room at home. Denies any LE edema.  - can have some occasional chest pain. Started about 6 months ago. Occurs with exertion, aching pain 4/10. Better with rest. Not positional. Increase in frequency since onset, no increase in severity.  - secondhand smoke exposure with husband. +cough at times. Occasional wheezing. Since last visit she had normal PFTs.  - echo 12/2012 [facility]: technically difficult. LVEF 55-60%, abnormal diastolic function.  - negative exercise cardiolite in 2016 - 10/2015 echo LVEF 123456, grade I diastolic dysfunction CAD risk factors: DM2, hyperlipidemia, HTN.    - since last visit she completed a LHC/RHC in 11/2015. No significant CAD, normal filling pressures. PCWP 7, mean PA 15.  - symptoms unchanged since last visit.    Past Medical History:  Diagnosis Date  . Hypercholesterolemia   . Hypertension   . Sleep apnea    on CPAP 14 mm PS since February 2017  . Stroke ([place])   . Vitamin D deficiency      Allergies  Allergen Reactions  . Peanut-Containing Drug Products Rash  . Penicillins Rash    Has patient had a PCN reaction causing immediate rash, facial/tongue/throat swelling, SOB or lightheadedness with hypotension: Yes Has patient had a PCN reaction causing severe rash involving mucus membranes or skin necrosis: No Has patient had a PCN reaction that required hospitalization No Has patient had a PCN reaction occurring within the last 10 years: No. Has not had in 40-50 years If all of the above answers are "NO", then may proceed with Cephalosporin use.     Current Outpatient Prescriptions  Medication Sig Dispense Refill  . ACCU-CHEK AVIVA PLUS test strip Inject 1 strip as directed daily.  4  . clopidogrel (PLAVIX) 75 MG tablet Take 75 mg by mouth daily.    [name] donepezil (ARICEPT)  10 MG tablet Take 10 mg by mouth at bedtime.    . Dulaglutide (TRULICITY) [id number] 0000000 SOPN Inject 0.5 mLs into the skin once a week.    . Empagliflozin-Linagliptin (GLYXAMBI) 10-5 MG TABS Take 1 tablet by mouth daily.    [name] EPINEPHrine (EPIPEN 2-PAK) 0.3 mg/0.3 mL IJ SOAJ injection Inject 0.3 mg into the muscle once.    . ergocalciferol (VITAMIN D2) 50000 units capsule Take 50,000 Units by mouth once a week.    . ferrous sulfate 325 (65 FE) MG tablet Take 325 mg by mouth daily with breakfast.    . glimepiride (AMARYL) 4 MG tablet Take 4 mg by mouth 2 (two) times daily.    . Lancets Thin MISC Inject 1 Stick into the skin daily.    [name] lisinopril-hydrochlorothiazide (PRINZIDE,ZESTORETIC) 20-12.5 MG tablet Take 1 tablet by mouth 2 (two) times daily.     . metFORMIN (GLUCOPHAGE) 1000 MG tablet Take 1 tablet (1,000 mg total) by mouth 2 (two) times daily.    . Potassium 95 MG TABS Take 1 tablet by mouth daily.    . simvastatin (ZOCOR) 20 MG tablet Take 20 mg by mouth daily.     No current facility-administered medications for this visit.      Past Surgical History:  Procedure Laterality Date  . CARDIAC CATHETERIZATION N/A 12/24/2015   Procedure: Right/Left Heart Cath and Coronary Angiography;  Surgeon: Jettie Booze, MD;  Location: [facility];  Service: Cardiovascular;  Laterality: N/A;     Allergies  Allergen Reactions  . Peanut-Containing Drug Products Rash  . Penicillins Rash    Has patient had a PCN reaction causing immediate rash, facial/tongue/throat swelling, SOB or lightheadedness with hypotension: Yes Has patient had a PCN reaction causing severe rash involving mucus membranes or skin necrosis: No Has patient had a PCN reaction that required hospitalization No Has patient had a PCN reaction occurring within the last 10 years: No. Has not had in 40-50 years If all of the above answers are "NO", then may proceed with Cephalosporin use.      Family History  Problem  Relation Age of Onset  . Thyroid cancer Father   . Diabetes Mellitus II Mother      Social History Ms. Dibble reports that she has never smoked. She has never used smokeless tobacco. Ms. Breceda has no alcohol history on file.   Review of Systems CONSTITUTIONAL: No weight loss, fever, chills, weakness or fatigue.  HEENT: Eyes: No visual loss, blurred vision, double vision or yellow sclerae.No hearing loss, sneezing, congestion, runny nose or sore throat.  SKIN: No rash or itching.  CARDIOVASCULAR: per HPI RESPIRATORY: per HPI GASTROINTESTINAL: No anorexia, nausea, vomiting or diarrhea. No abdominal pain or blood.  GENITOURINARY: No burning on urination, no polyuria NEUROLOGICAL: No headache, dizziness, syncope, paralysis, ataxia, numbness or tingling in the extremities. No change in bowel or bladder control.  MUSCULOSKELETAL: No muscle, back pain, joint pain or stiffness.  LYMPHATICS: No enlarged nodes. No history of splenectomy.  PSYCHIATRIC: No history of depression or anxiety.  ENDOCRINOLOGIC: No reports of sweating, cold or heat intolerance. No polyuria or polydipsia.  [name]   Physical Examination Vitals:   01/31/16 1521  BP: 115/74  Pulse: 98   Vitals:   01/31/16 1521  Weight: 223 lb (101.2 kg)  Height: 5' (1.524 m)    Gen: resting comfortably, no acute distress HEENT: no scleral icterus, pupils equal round and reactive, no palptable cervical adenopathy,  CV: RRR, no m/r/g, no jvd Resp: Clear to auscultation bilaterally GI: abdomen is soft, non-tender, non-distended, normal bowel sounds, no hepatosplenomegaly MSK: extremities are warm, no edema.  Skin: warm, no rash Neuro:  no focal deficits Psych: appropriate affect   Diagnostic Studies 10/2015 echo Study Conclusions  - Left ventricle: The cavity size was normal. Systolic function was normal. The estimated ejection fraction was in the range of 60% to 65%. Mild concentric and moderate focal basal  septal hypertrophy. Wall motion was normal; there were no regional wall motion abnormalities. Doppler parameters are consistent with abnormal left ventricular relaxation (grade 1 diastolic dysfunction). - Aortic valve: Trileaflet; mildly thickened leaflets.  11/2015 Cath  LV end diastolic pressure is normal.  Normal right heart pressures.  Low pulmonary artery saturation, 52%. CO 3.9 L/min; Cardiac index 2.0  No angiographically apparent coronary artery disease.  Tortuous right innominate by selective angiogram.   Normal LVEF by echo.  Unclear etiology of low PA saturation.  No ventriculogram due to renal insufficiency.  Continue medical therapy, treatment of OSA and weight loss.  Assessment and Plan  1. Chest pain/SOB - unclear etiology. Cardiac workup is benign, including recent cath with normal coronaries and filling pressures. - no further cardiac workup at this time - deconditioning likely playing a role, encouraged increased exercise.  - she may f/u with [place] as needed.          [name], M.D.

## 2016-02-03 DIAGNOSIS — I639 Cerebral infarction, unspecified: Secondary | ICD-10-CM | POA: Diagnosis not present

## 2016-02-03 DIAGNOSIS — Z299 Encounter for prophylactic measures, unspecified: Secondary | ICD-10-CM | POA: Diagnosis not present

## 2016-02-03 DIAGNOSIS — Z6841 Body Mass Index (BMI) 40.0 and over, adult: Secondary | ICD-10-CM | POA: Diagnosis not present

## 2016-02-03 DIAGNOSIS — E1165 Type 2 diabetes mellitus with hyperglycemia: Secondary | ICD-10-CM | POA: Diagnosis not present

## 2016-02-03 DIAGNOSIS — F0390 Unspecified dementia without behavioral disturbance: Secondary | ICD-10-CM | POA: Diagnosis not present

## 2016-02-04 DIAGNOSIS — I639 Cerebral infarction, unspecified: Secondary | ICD-10-CM | POA: Diagnosis not present

## 2016-02-04 DIAGNOSIS — I10 Essential (primary) hypertension: Secondary | ICD-10-CM | POA: Diagnosis not present

## 2016-02-04 DIAGNOSIS — E119 Type 2 diabetes mellitus without complications: Secondary | ICD-10-CM | POA: Diagnosis not present

## 2016-03-29 DIAGNOSIS — E119 Type 2 diabetes mellitus without complications: Secondary | ICD-10-CM | POA: Diagnosis not present

## 2016-03-29 DIAGNOSIS — I10 Essential (primary) hypertension: Secondary | ICD-10-CM | POA: Diagnosis not present

## 2016-03-29 DIAGNOSIS — I639 Cerebral infarction, unspecified: Secondary | ICD-10-CM | POA: Diagnosis not present

## 2016-04-19 DIAGNOSIS — Z299 Encounter for prophylactic measures, unspecified: Secondary | ICD-10-CM | POA: Diagnosis not present

## 2016-04-19 DIAGNOSIS — Z789 Other specified health status: Secondary | ICD-10-CM | POA: Diagnosis not present

## 2016-04-19 DIAGNOSIS — Z1211 Encounter for screening for malignant neoplasm of colon: Secondary | ICD-10-CM | POA: Diagnosis not present

## 2016-04-19 DIAGNOSIS — Z6841 Body Mass Index (BMI) 40.0 and over, adult: Secondary | ICD-10-CM | POA: Diagnosis not present

## 2016-04-19 DIAGNOSIS — Z1389 Encounter for screening for other disorder: Secondary | ICD-10-CM | POA: Diagnosis not present

## 2016-04-19 DIAGNOSIS — Z7189 Other specified counseling: Secondary | ICD-10-CM | POA: Diagnosis not present

## 2016-04-19 DIAGNOSIS — Z0000 Encounter for general adult medical examination without abnormal findings: Secondary | ICD-10-CM | POA: Diagnosis not present

## 2016-04-20 DIAGNOSIS — R5383 Other fatigue: Secondary | ICD-10-CM | POA: Diagnosis not present

## 2016-04-20 DIAGNOSIS — Z79899 Other long term (current) drug therapy: Secondary | ICD-10-CM | POA: Diagnosis not present

## 2016-04-20 DIAGNOSIS — E7800 Pure hypercholesterolemia, unspecified: Secondary | ICD-10-CM | POA: Diagnosis not present

## 2016-04-20 DIAGNOSIS — E559 Vitamin D deficiency, unspecified: Secondary | ICD-10-CM | POA: Diagnosis not present

## 2016-05-01 HISTORY — PX: COLONOSCOPY: SHX174

## 2016-05-09 DIAGNOSIS — E119 Type 2 diabetes mellitus without complications: Secondary | ICD-10-CM | POA: Diagnosis not present

## 2016-05-09 DIAGNOSIS — H25813 Combined forms of age-related cataract, bilateral: Secondary | ICD-10-CM | POA: Diagnosis not present

## 2016-05-22 DIAGNOSIS — I10 Essential (primary) hypertension: Secondary | ICD-10-CM | POA: Diagnosis not present

## 2016-05-22 DIAGNOSIS — I639 Cerebral infarction, unspecified: Secondary | ICD-10-CM | POA: Diagnosis not present

## 2016-05-22 DIAGNOSIS — E119 Type 2 diabetes mellitus without complications: Secondary | ICD-10-CM | POA: Diagnosis not present

## 2016-05-31 DIAGNOSIS — F0390 Unspecified dementia without behavioral disturbance: Secondary | ICD-10-CM | POA: Diagnosis not present

## 2016-05-31 DIAGNOSIS — I10 Essential (primary) hypertension: Secondary | ICD-10-CM | POA: Diagnosis not present

## 2016-05-31 DIAGNOSIS — I639 Cerebral infarction, unspecified: Secondary | ICD-10-CM | POA: Diagnosis not present

## 2016-05-31 DIAGNOSIS — J069 Acute upper respiratory infection, unspecified: Secondary | ICD-10-CM | POA: Diagnosis not present

## 2016-05-31 DIAGNOSIS — Z713 Dietary counseling and surveillance: Secondary | ICD-10-CM | POA: Diagnosis not present

## 2016-05-31 DIAGNOSIS — E1165 Type 2 diabetes mellitus with hyperglycemia: Secondary | ICD-10-CM | POA: Diagnosis not present

## 2016-05-31 DIAGNOSIS — N183 Chronic kidney disease, stage 3 (moderate): Secondary | ICD-10-CM | POA: Diagnosis not present

## 2016-05-31 DIAGNOSIS — Z299 Encounter for prophylactic measures, unspecified: Secondary | ICD-10-CM | POA: Diagnosis not present

## 2016-05-31 DIAGNOSIS — Z6841 Body Mass Index (BMI) 40.0 and over, adult: Secondary | ICD-10-CM | POA: Diagnosis not present

## 2016-06-15 DIAGNOSIS — H25811 Combined forms of age-related cataract, right eye: Secondary | ICD-10-CM | POA: Diagnosis not present

## 2016-06-15 DIAGNOSIS — E119 Type 2 diabetes mellitus without complications: Secondary | ICD-10-CM | POA: Diagnosis not present

## 2016-06-15 DIAGNOSIS — H02831 Dermatochalasis of right upper eyelid: Secondary | ICD-10-CM | POA: Diagnosis not present

## 2016-06-15 DIAGNOSIS — H25812 Combined forms of age-related cataract, left eye: Secondary | ICD-10-CM | POA: Diagnosis not present

## 2016-06-27 DIAGNOSIS — Z299 Encounter for prophylactic measures, unspecified: Secondary | ICD-10-CM | POA: Diagnosis not present

## 2016-06-27 DIAGNOSIS — Z6841 Body Mass Index (BMI) 40.0 and over, adult: Secondary | ICD-10-CM | POA: Diagnosis not present

## 2016-06-27 DIAGNOSIS — Z789 Other specified health status: Secondary | ICD-10-CM | POA: Diagnosis not present

## 2016-06-27 DIAGNOSIS — I10 Essential (primary) hypertension: Secondary | ICD-10-CM | POA: Diagnosis not present

## 2016-06-27 DIAGNOSIS — G4730 Sleep apnea, unspecified: Secondary | ICD-10-CM | POA: Diagnosis not present

## 2016-06-27 DIAGNOSIS — Z713 Dietary counseling and surveillance: Secondary | ICD-10-CM | POA: Diagnosis not present

## 2016-06-27 DIAGNOSIS — E7800 Pure hypercholesterolemia, unspecified: Secondary | ICD-10-CM | POA: Diagnosis not present

## 2016-06-29 DIAGNOSIS — H25812 Combined forms of age-related cataract, left eye: Secondary | ICD-10-CM | POA: Diagnosis not present

## 2016-06-29 DIAGNOSIS — H2512 Age-related nuclear cataract, left eye: Secondary | ICD-10-CM | POA: Diagnosis not present

## 2016-08-23 DIAGNOSIS — H04202 Unspecified epiphora, left lacrimal gland: Secondary | ICD-10-CM | POA: Diagnosis not present

## 2016-09-06 DIAGNOSIS — F0390 Unspecified dementia without behavioral disturbance: Secondary | ICD-10-CM | POA: Diagnosis not present

## 2016-09-06 DIAGNOSIS — E1122 Type 2 diabetes mellitus with diabetic chronic kidney disease: Secondary | ICD-10-CM | POA: Diagnosis not present

## 2016-09-06 DIAGNOSIS — N183 Chronic kidney disease, stage 3 (moderate): Secondary | ICD-10-CM | POA: Diagnosis not present

## 2016-09-06 DIAGNOSIS — E7800 Pure hypercholesterolemia, unspecified: Secondary | ICD-10-CM | POA: Diagnosis not present

## 2016-09-06 DIAGNOSIS — Z1231 Encounter for screening mammogram for malignant neoplasm of breast: Secondary | ICD-10-CM | POA: Diagnosis not present

## 2016-09-06 DIAGNOSIS — I10 Essential (primary) hypertension: Secondary | ICD-10-CM | POA: Diagnosis not present

## 2016-09-06 DIAGNOSIS — Z6841 Body Mass Index (BMI) 40.0 and over, adult: Secondary | ICD-10-CM | POA: Diagnosis not present

## 2016-09-06 DIAGNOSIS — I639 Cerebral infarction, unspecified: Secondary | ICD-10-CM | POA: Diagnosis not present

## 2016-09-06 DIAGNOSIS — E1165 Type 2 diabetes mellitus with hyperglycemia: Secondary | ICD-10-CM | POA: Diagnosis not present

## 2016-09-06 DIAGNOSIS — G4730 Sleep apnea, unspecified: Secondary | ICD-10-CM | POA: Diagnosis not present

## 2016-09-06 DIAGNOSIS — Z299 Encounter for prophylactic measures, unspecified: Secondary | ICD-10-CM | POA: Diagnosis not present

## 2016-09-20 ENCOUNTER — Other Ambulatory Visit: Payer: Self-pay | Admitting: Internal Medicine

## 2016-09-20 DIAGNOSIS — R921 Mammographic calcification found on diagnostic imaging of breast: Secondary | ICD-10-CM

## 2016-09-20 DIAGNOSIS — R928 Other abnormal and inconclusive findings on diagnostic imaging of breast: Secondary | ICD-10-CM | POA: Diagnosis not present

## 2016-09-26 DIAGNOSIS — E119 Type 2 diabetes mellitus without complications: Secondary | ICD-10-CM | POA: Diagnosis not present

## 2016-09-26 DIAGNOSIS — I639 Cerebral infarction, unspecified: Secondary | ICD-10-CM | POA: Diagnosis not present

## 2016-09-26 DIAGNOSIS — I10 Essential (primary) hypertension: Secondary | ICD-10-CM | POA: Diagnosis not present

## 2016-10-02 ENCOUNTER — Ambulatory Visit
Admission: RE | Admit: 2016-10-02 | Discharge: 2016-10-02 | Disposition: A | Discharge status: Home / Self Care | Payer: Medicare Other | Source: Ambulatory Visit | Attending: Internal Medicine | Admitting: Internal Medicine

## 2016-10-02 DIAGNOSIS — R921 Mammographic calcification found on diagnostic imaging of breast: Secondary | ICD-10-CM

## 2016-10-02 DIAGNOSIS — D241 Benign neoplasm of right breast: Secondary | ICD-10-CM | POA: Diagnosis not present

## 2016-10-10 DIAGNOSIS — I10 Essential (primary) hypertension: Secondary | ICD-10-CM | POA: Diagnosis not present

## 2016-10-10 DIAGNOSIS — E119 Type 2 diabetes mellitus without complications: Secondary | ICD-10-CM | POA: Diagnosis not present

## 2016-10-10 DIAGNOSIS — I639 Cerebral infarction, unspecified: Secondary | ICD-10-CM | POA: Diagnosis not present

## 2016-11-09 DIAGNOSIS — I10 Essential (primary) hypertension: Secondary | ICD-10-CM | POA: Diagnosis not present

## 2016-11-09 DIAGNOSIS — I639 Cerebral infarction, unspecified: Secondary | ICD-10-CM | POA: Diagnosis not present

## 2016-11-09 DIAGNOSIS — E119 Type 2 diabetes mellitus without complications: Secondary | ICD-10-CM | POA: Diagnosis not present

## 2016-11-23 ENCOUNTER — Encounter: Payer: Self-pay | Admitting: Physician Assistant

## 2016-11-30 ENCOUNTER — Telehealth: Payer: Self-pay

## 2016-11-30 ENCOUNTER — Encounter: Payer: Self-pay | Admitting: Physician Assistant

## 2016-11-30 ENCOUNTER — Ambulatory Visit (INDEPENDENT_AMBULATORY_CARE_PROVIDER_SITE_OTHER): Payer: Medicare Other | Admitting: Physician Assistant

## 2016-11-30 ENCOUNTER — Encounter (INDEPENDENT_AMBULATORY_CARE_PROVIDER_SITE_OTHER): Payer: Self-pay

## 2016-11-30 VITALS — BP 122/60 | HR 72 | Ht 60.0 in | Wt 231.5 lb

## 2016-11-30 DIAGNOSIS — Z1211 Encounter for screening for malignant neoplasm of colon: Secondary | ICD-10-CM | POA: Diagnosis not present

## 2016-11-30 DIAGNOSIS — Z800 Family history of malignant neoplasm of digestive organs: Secondary | ICD-10-CM | POA: Diagnosis not present

## 2016-11-30 DIAGNOSIS — I10 Essential (primary) hypertension: Secondary | ICD-10-CM | POA: Insufficient documentation

## 2016-11-30 DIAGNOSIS — Z7901 Long term (current) use of anticoagulants: Secondary | ICD-10-CM | POA: Diagnosis not present

## 2016-11-30 DIAGNOSIS — Z9884 Bariatric surgery status: Secondary | ICD-10-CM | POA: Diagnosis not present

## 2016-11-30 DIAGNOSIS — I639 Cerebral infarction, unspecified: Secondary | ICD-10-CM | POA: Diagnosis not present

## 2016-11-30 DIAGNOSIS — E1110 Type 2 diabetes mellitus with ketoacidosis without coma: Secondary | ICD-10-CM | POA: Insufficient documentation

## 2016-11-30 DIAGNOSIS — I679 Cerebrovascular disease, unspecified: Secondary | ICD-10-CM | POA: Insufficient documentation

## 2016-11-30 MED ORDER — SUPREP BOWEL PREP KIT 17.5-3.13-1.6 GM/177ML PO SOLN: ORAL | 0 refills | Status: DC

## 2016-11-30 NOTE — Patient Instructions (Signed)
You have been scheduled for a colonoscopy. Please follow written instructions given to you at your visit today.  Please pick up your prep supplies at the pharmacy within the next 1-3 days. If you use inhalers (even only as needed), please bring them with you on the day of your procedure. Your physician has requested that you go to www.startemmi.com and enter the access code given to you at your visit today. This web site gives a general overview about your procedure. However, you should still follow specific instructions given to you by our office regarding your preparation for the procedure.  We have sent the following medications to your pharmacy for you to pick up at your convenience: suprep  If you are age 44 or older, your body mass index should be between 23-30. Your Body mass index is 45.21 kg/m. If this is out of the aforementioned range listed, please consider follow up with your Primary Care Provider.  If you are age 65 or younger, your body mass index should be between 19-25. Your Body mass index is 45.21 kg/m. If this is out of the aformentioned range listed, please consider follow up with your Primary Care Provider.

## 2016-11-30 NOTE — Progress Notes (Signed)
Reviewed and agree with initial management plan.  Derry Kassel T. Manjinder Breau, MD FACG 

## 2016-11-30 NOTE — Telephone Encounter (Signed)
   Jessica Strong [name] 02/23/1946 [id number]  Dear Dr. Woody Seller:  We have scheduled the above named patient for a(n) colonoscopy procedure. Our records show that (s)he is on anticoagulation therapy.  Please advise as to whether the patient may come off their therapy of Plavix 5 days prior to their procedure which is scheduled for 02-12-17.  Please route your response to Jessica Strong, CMA or fax response to [phone].  Sincerely,   [facility]

## 2016-11-30 NOTE — Progress Notes (Signed)
Subjective:    Patient ID: Jessica Strong, female    DOB: Sep 29, 1945, 70 y.o.   MRN: [id number]  HPI Jessica Strong is a pleasant 71 year old African-American female, new to GI today self referred . Her primary M.D. is Dr. Veva Strong in [place]. Patient comes in today to discuss colonoscopy. She is on chronic antiplatelet therapy with Plavix. She says she did have previous colonoscopy greater than 10 years ago done in [place] and says this was a negative exam. She is concerned at this point because her son was just diagnosed with colon cancer at age 11 within the past couple of weeks. She also states that her maternal grandmother and her mother both had colon cancer. Patient has history of adult-onset diabetes mellitus, morbid obesity, hypertension, and had a remote CVA she says greater than 10 years ago. It sounds as if she had some sort of  Cerebral aneurysm coiled at [facility]. She has been on Plavix since that time with no other events. She has no current complaints of abdominal pain, has some mild constipation off and on no melena or hematochezia. Appetite has been fine and weight has been stable.  Review of Systems Pertinent positive and negative review of systems were noted in the above HPI section.  All other review of systems was otherwise negative.  Outpatient Encounter Prescriptions as of 11/30/2016  Medication Sig  . ACCU-CHEK AVIVA PLUS test strip Inject 1 strip as directed daily.  . clopidogrel (PLAVIX) 75 MG tablet Take 75 mg by mouth daily.  [name] donepezil (ARICEPT) 10 MG tablet Take 10 mg by mouth at bedtime.  . Dulaglutide (TRULICITY) 7.61 HH/8.3UP SOPN Inject 0.5 mLs into the skin once a week.  . Empagliflozin-Linagliptin (GLYXAMBI) 10-5 MG TABS Take 1 tablet by mouth daily.  [name] EPINEPHrine (EPIPEN 2-PAK) 0.3 mg/0.3 mL IJ SOAJ injection Inject 0.3 mg into the muscle once.  . ergocalciferol (VITAMIN D2) 50000 units capsule Take 50,000 Units by mouth once a week.  . ferrous  sulfate 325 (65 FE) MG tablet Take 325 mg by mouth daily with breakfast.  . glimepiride (AMARYL) 4 MG tablet Take 4 mg by mouth 2 (two) times daily.  . Lancets Thin MISC Inject 1 Stick into the skin daily.  [name] lisinopril-hydrochlorothiazide (PRINZIDE,ZESTORETIC) 20-12.5 MG tablet Take 1 tablet by mouth 2 (two) times daily.   . metFORMIN (GLUCOPHAGE) 1000 MG tablet Take 1 tablet (1,000 mg total) by mouth 2 (two) times daily.  . Potassium 95 MG TABS Take 1 tablet by mouth daily.  . simvastatin (ZOCOR) 20 MG tablet Take 20 mg by mouth daily.  Manus Gunning BOWEL PREP KIT 17.5-3.13-1.6 GM/180ML SOLN Suprep-Use as directed   No facility-administered encounter medications on file as of 11/30/2016.    Allergies  Allergen Reactions  . Peanut-Containing Drug Products Rash  . Penicillins Rash    Has patient had a PCN reaction causing immediate rash, facial/tongue/throat swelling, SOB or lightheadedness with hypotension: Yes Has patient had a PCN reaction causing severe rash involving mucus membranes or skin necrosis: No Has patient had a PCN reaction that required hospitalization No Has patient had a PCN reaction occurring within the last 10 years: No. Has not had in 40-50 years If all of the above answers are "NO", then may proceed with Cephalosporin use.   Patient Active Problem List   Diagnosis Date Noted  . SOB (shortness of breath)    Social History   Social History  . Marital status: Married  Spouse name: N/A  . Number of children: 2  . Years of education: N/A   Occupational History  . retired    Social History Main Topics  . Smoking status: Never Smoker  . Smokeless tobacco: Never Used  . Alcohol use No     Comment: a drink about every six months  . Drug use: No  . Sexual activity: No   Other Topics Concern  . Not on file   Social History Narrative  . No narrative on file    Jessica Strong's family history includes Colon cancer in her maternal grandmother, mother, and son;  Diabetes in her father and mother; Stomach cancer in her paternal grandfather; Stroke in her maternal grandfather; Thyroid cancer in her father.      Objective:    Vitals:   11/30/16 1001  BP: 122/60  Pulse: 72    Physical Exam  well-developed older African-American female in no acute distress, very pleasant blood pressure and 22/60 pulse 72, height 5 foot, weight 231, BMI 45.2. HEENT; nontraumatic normocephalic EOMI PERRLA sclera anicteric, Cardiovascular; regular rate and rhythm with S1-S2 no murmur or gallop, Pulmonary ;clear bilaterally abdomen morbidly obese, soft nontender nondistended bowel sounds are active there is no palpable mass or hepatosplenomegaly she does have a long midline incisional scar, Rectal; exam not done, Extremities; no clubbing cyanosis or edema skin warm and dry, Neuropsych ;mood and affect appropriate       Assessment & Strong:   #32 71 year old African-American female with strong family history of colon cancer who comes in today to schedule follow-up colonoscopy, last exam done in [place] greater than 10 years ago and reportedly negative. #2 chronic antiplatelet therapy-on Plavix #3 morbid obesity BMI 45 #4 adult-onset diabetes mellitus #5 hypertension # 6 Remote history of CVA greater than 10 years ago #7 status post remote gastric bypass  Strong; patient will be scheduled for colonoscopy with Dr. Fuller Strong. Procedure discussed in detail with patient including risks and benefits and she is agreeable to proceed. She will need to hold Plavix for 5 days prior to the procedure. We will communicate with her PCP Dr. Woody Strong to assure that holding Plavix for 5 days prior to colonoscopy is reasonable for this patient. Patient was advised that with her family history she should have surveillance colonoscopy at least every 5 years.  Jessica Strong S Jessica Onnen PA-C 11/30/2016   Cc: Jessica Chroman, MD

## 2016-12-12 DIAGNOSIS — E7800 Pure hypercholesterolemia, unspecified: Secondary | ICD-10-CM | POA: Diagnosis not present

## 2016-12-12 DIAGNOSIS — E1165 Type 2 diabetes mellitus with hyperglycemia: Secondary | ICD-10-CM | POA: Diagnosis not present

## 2016-12-12 DIAGNOSIS — E1122 Type 2 diabetes mellitus with diabetic chronic kidney disease: Secondary | ICD-10-CM | POA: Diagnosis not present

## 2016-12-12 DIAGNOSIS — Z6841 Body Mass Index (BMI) 40.0 and over, adult: Secondary | ICD-10-CM | POA: Diagnosis not present

## 2016-12-12 DIAGNOSIS — G4730 Sleep apnea, unspecified: Secondary | ICD-10-CM | POA: Diagnosis not present

## 2016-12-12 DIAGNOSIS — Z299 Encounter for prophylactic measures, unspecified: Secondary | ICD-10-CM | POA: Diagnosis not present

## 2016-12-12 DIAGNOSIS — N183 Chronic kidney disease, stage 3 (moderate): Secondary | ICD-10-CM | POA: Diagnosis not present

## 2016-12-12 DIAGNOSIS — F0390 Unspecified dementia without behavioral disturbance: Secondary | ICD-10-CM | POA: Diagnosis not present

## 2016-12-12 DIAGNOSIS — I10 Essential (primary) hypertension: Secondary | ICD-10-CM | POA: Diagnosis not present

## 2016-12-12 DIAGNOSIS — E669 Obesity, unspecified: Secondary | ICD-10-CM | POA: Diagnosis not present

## 2016-12-12 NOTE — Telephone Encounter (Signed)
Received clearance fax from Dr. Woody Seller. Ok to hold Plavix for 5 days prior to procedure.  Called and spoke to patient, [name]. Told her to hold Plavix starting on 02-07-17 for her 02-12-17 procedure. Pt expressed understanding.

## 2017-01-08 DIAGNOSIS — E119 Type 2 diabetes mellitus without complications: Secondary | ICD-10-CM | POA: Diagnosis not present

## 2017-01-08 DIAGNOSIS — I639 Cerebral infarction, unspecified: Secondary | ICD-10-CM | POA: Diagnosis not present

## 2017-01-08 DIAGNOSIS — I10 Essential (primary) hypertension: Secondary | ICD-10-CM | POA: Diagnosis not present

## 2017-02-02 DIAGNOSIS — E119 Type 2 diabetes mellitus without complications: Secondary | ICD-10-CM | POA: Diagnosis not present

## 2017-02-02 DIAGNOSIS — I639 Cerebral infarction, unspecified: Secondary | ICD-10-CM | POA: Diagnosis not present

## 2017-02-02 DIAGNOSIS — I10 Essential (primary) hypertension: Secondary | ICD-10-CM | POA: Diagnosis not present

## 2017-02-12 ENCOUNTER — Encounter: Payer: Self-pay | Admitting: Gastroenterology

## 2017-02-12 ENCOUNTER — Ambulatory Visit (AMBULATORY_SURGERY_CENTER): Payer: Medicare Other | Admitting: Gastroenterology

## 2017-02-12 VITALS — BP 105/58 | HR 64 | Temp 98.9°F | Resp 23 | Ht 60.0 in | Wt 231.0 lb

## 2017-02-12 DIAGNOSIS — D122 Benign neoplasm of ascending colon: Secondary | ICD-10-CM | POA: Diagnosis not present

## 2017-02-12 DIAGNOSIS — Z1212 Encounter for screening for malignant neoplasm of rectum: Secondary | ICD-10-CM | POA: Diagnosis not present

## 2017-02-12 DIAGNOSIS — Z1211 Encounter for screening for malignant neoplasm of colon: Secondary | ICD-10-CM

## 2017-02-12 DIAGNOSIS — I2510 Atherosclerotic heart disease of native coronary artery without angina pectoris: Secondary | ICD-10-CM | POA: Diagnosis not present

## 2017-02-12 DIAGNOSIS — I252 Old myocardial infarction: Secondary | ICD-10-CM | POA: Diagnosis not present

## 2017-02-12 DIAGNOSIS — D125 Benign neoplasm of sigmoid colon: Secondary | ICD-10-CM

## 2017-02-12 DIAGNOSIS — D123 Benign neoplasm of transverse colon: Secondary | ICD-10-CM | POA: Diagnosis not present

## 2017-02-12 DIAGNOSIS — K635 Polyp of colon: Secondary | ICD-10-CM

## 2017-02-12 DIAGNOSIS — Z800 Family history of malignant neoplasm of digestive organs: Secondary | ICD-10-CM

## 2017-02-12 DIAGNOSIS — Z8673 Personal history of transient ischemic attack (TIA), and cerebral infarction without residual deficits: Secondary | ICD-10-CM | POA: Diagnosis not present

## 2017-02-12 MED ORDER — SODIUM CHLORIDE 0.9 % IV SOLN: 500.0000 mL | INTRAVENOUS | Status: DC

## 2017-02-12 NOTE — Progress Notes (Signed)
Called to room to assist during endoscopic procedure.  Patient ID and intended procedure confirmed with present staff. Received instructions for my participation in the procedure from the performing physician.  

## 2017-02-12 NOTE — Op Note (Signed)
[place] Patient Name: Jessica Strong Procedure Date: 02/12/2017 11:03 AM MRN: [id number] Endoscopist: Ladene Artist , MD Age: 71 Referring MD:  Date of Birth: 04/15/1946 Gender: Female Account #: [id number] Procedure:                Colonoscopy Indications:              Screening in patient at increased risk: Family                            history of 1st-degree relative with colorectal                            cancer before age 47 years Medicines:                Monitored Anesthesia Care Procedure:                Pre-Anesthesia Assessment:                           - Prior to the procedure, a History and Physical                            was performed, and patient medications and                            allergies were reviewed. The patient's tolerance of                            previous anesthesia was also reviewed. The risks                            and benefits of the procedure and the sedation                            options and risks were discussed with the patient.                            All questions were answered, and informed consent                            was obtained. Prior Anticoagulants: The patient has                            taken Plavix (clopidogrel), last dose was 5 days                            prior to procedure. ASA Grade Assessment: III - A                            patient with severe systemic disease. After                            reviewing the risks and benefits, the patient was  deemed in satisfactory condition to undergo the                            procedure.                           After obtaining informed consent, the colonoscope                            was passed under direct vision. Throughout the                            procedure, the patient's blood pressure, pulse, and                            oxygen saturations were monitored continuously. The          Model [id number]-[id number]) scope was introduced                            through the anus and advanced to the the cecum,                            identified by appendiceal orifice and ileocecal                            valve. The ileocecal valve, appendiceal orifice,                            and rectum were photographed. The quality of the                            bowel preparation was excellent. The colonoscopy                            was performed without difficulty. The patient                            tolerated the procedure well. Scope In: 75:64:33 AM Scope Out: 11:25:37 AM Scope Withdrawal Time: 0 hours 13 minutes 13 seconds  Total Procedure Duration: 0 hours 16 minutes 19 seconds  Findings:                 The perianal and digital rectal examinations were                            normal.                           A 12 mm polyp was found in the transverse colon.                            The polyp was sessile. The polyp was removed with a                            hot snare. Resection  and retrieval were complete.                           Three sessile polyps were found in the sigmoid                            colon, transverse colon and ascending colon. The                            polyps were 6 to 7 mm in size. These polyps were                            removed with a cold snare. Resection and retrieval                            were complete.                           Multiple small-mouthed diverticula were found in                            the left colon. There was no evidence of                            diverticular bleeding.                           The exam was otherwise without abnormality on                            direct and retroflexion views.                           Two medium-sized localized angiodysplastic lesions                            without bleeding were found in the ascending colon. Complications:            No  immediate complications. Estimated blood loss:                            None. Estimated Blood Loss:     Estimated blood loss: none. Impression:               - One 12 mm polyp in the transverse colon, removed                            with a hot snare. Resected and retrieved.                           - Three 6 to 7 mm polyps in the sigmoid colon, in                            the transverse colon and in the ascending colon,  removed with a cold snare. Resected and retrieved.                           - Two ascending colon AVMs.                           - Mild diverticulosis in the left colon. There was                            no evidence of diverticular bleeding.                           - The examination was otherwise normal on direct                            and retroflexion views. Recommendation:           - Repeat colonoscopy in 3 years for surveillance.                           - Resume Plavix (clopidogrel) in 2 days at prior                            dose. Refer to managing physician for further                            adjustment of therapy.                           - Patient has a contact number available for                            emergencies. The signs and symptoms of potential                            delayed complications were discussed with the                            patient. Return to normal activities tomorrow.                            Written discharge instructions were provided to the                            patient.                           - Resume previous diet.                           - Continue present medications.                           - Await pathology results. Ladene Artist, MD 02/12/2017 11:30:20 AM This report has been signed electronically.

## 2017-02-12 NOTE — Progress Notes (Signed)
Report given to PACU, vss 

## 2017-02-12 NOTE — Patient Instructions (Signed)
Discharge instructions given. Handouts on polyps and diverticulosis. resume previous medications. Resume Plavix at prior dose in 2 days. YOU HAD AN ENDOSCOPIC PROCEDURE TODAY AT [facility]:   Refer to the procedure report that was given to you for any specific questions about what was found during the examination.  If the procedure report does not answer your questions, please call your gastroenterologist to clarify.  If you requested that your care partner not be given the details of your procedure findings, then the procedure report has been included in a sealed envelope for you to review at your convenience later.  YOU SHOULD EXPECT: Some feelings of bloating in the abdomen. Passage of more gas than usual.  Walking can help get rid of the air that was put into your GI tract during the procedure and reduce the bloating. If you had a lower endoscopy (such as a colonoscopy or flexible sigmoidoscopy) you may notice spotting of blood in your stool or on the toilet paper. If you underwent a bowel prep for your procedure, you may not have a normal bowel movement for a few days.  Please Note:  You might notice some irritation and congestion in your nose or some drainage.  This is from the oxygen used during your procedure.  There is no need for concern and it should clear up in a day or so.  SYMPTOMS TO REPORT IMMEDIATELY:   Following lower endoscopy (colonoscopy or flexible sigmoidoscopy):  Excessive amounts of blood in the stool  Significant tenderness or worsening of abdominal pains  Swelling of the abdomen that is new, acute  Fever of 100F or higher  For urgent or emergent issues, a gastroenterologist can be reached at any hour by calling [phone].   DIET:  We do recommend a small meal at first, but then you may proceed to your regular diet.  Drink plenty of fluids but you should avoid alcoholic beverages for 24 hours.  ACTIVITY:  You should plan to take it easy for  the rest of today and you should NOT DRIVE or use heavy machinery until tomorrow (because of the sedation medicines used during the test).    FOLLOW UP: Our staff will call the number listed on your records the next business day following your procedure to check on you and address any questions or concerns that you may have regarding the information given to you following your procedure. If we do not reach you, we will leave a message.  However, if you are feeling well and you are not experiencing any problems, there is no need to return our call.  We will assume that you have returned to your regular daily activities without incident.  If any biopsies were taken you will be contacted by phone or by letter within the next 1-3 weeks.  Please call us at [phone] if you have not heard about the biopsies in 3 weeks.    SIGNATURES/CONFIDENTIALITY: You and/or your care partner have signed paperwork which will be entered into your electronic medical record.  These signatures attest to the fact that that the information above on your After Visit Summary has been reviewed and is understood.  Full responsibility of the confidentiality of this discharge information lies with you and/or your care-partner.

## 2017-02-13 ENCOUNTER — Telehealth: Payer: Self-pay | Admitting: *Deleted

## 2017-02-13 NOTE — Telephone Encounter (Signed)
  Follow up Call-  Call back number 02/12/2017  Post procedure Call Back phone  # [phone]  Permission to leave phone message Yes     Patient questions:  Do you have a fever, pain , or abdominal swelling? No. Pain Score  0 *  Have you tolerated food without any problems? Yes.    Have you been able to return to your normal activities? Yes.    Do you have any questions about your discharge instructions: Diet   No. Medications  No. Follow up visit  No.  Do you have questions or concerns about your Care? No.  Actions: * If pain score is 4 or above: No action needed, pain <4.

## 2017-02-23 ENCOUNTER — Encounter: Payer: Self-pay | Admitting: Gastroenterology

## 2017-03-26 DIAGNOSIS — E113293 Type 2 diabetes mellitus with mild nonproliferative diabetic retinopathy without macular edema, bilateral: Secondary | ICD-10-CM | POA: Diagnosis not present

## 2017-03-26 DIAGNOSIS — H25811 Combined forms of age-related cataract, right eye: Secondary | ICD-10-CM | POA: Diagnosis not present

## 2017-03-28 DIAGNOSIS — E1165 Type 2 diabetes mellitus with hyperglycemia: Secondary | ICD-10-CM | POA: Diagnosis not present

## 2017-03-28 DIAGNOSIS — I10 Essential (primary) hypertension: Secondary | ICD-10-CM | POA: Diagnosis not present

## 2017-03-28 DIAGNOSIS — Z299 Encounter for prophylactic measures, unspecified: Secondary | ICD-10-CM | POA: Diagnosis not present

## 2017-03-28 DIAGNOSIS — Z6841 Body Mass Index (BMI) 40.0 and over, adult: Secondary | ICD-10-CM | POA: Diagnosis not present

## 2017-03-28 DIAGNOSIS — I639 Cerebral infarction, unspecified: Secondary | ICD-10-CM | POA: Diagnosis not present

## 2017-03-28 DIAGNOSIS — E119 Type 2 diabetes mellitus without complications: Secondary | ICD-10-CM | POA: Diagnosis not present

## 2017-04-30 DIAGNOSIS — Z0000 Encounter for general adult medical examination without abnormal findings: Secondary | ICD-10-CM | POA: Diagnosis not present

## 2017-04-30 DIAGNOSIS — Z6841 Body Mass Index (BMI) 40.0 and over, adult: Secondary | ICD-10-CM | POA: Diagnosis not present

## 2017-04-30 DIAGNOSIS — I10 Essential (primary) hypertension: Secondary | ICD-10-CM | POA: Diagnosis not present

## 2017-04-30 DIAGNOSIS — R5383 Other fatigue: Secondary | ICD-10-CM | POA: Diagnosis not present

## 2017-04-30 DIAGNOSIS — E7800 Pure hypercholesterolemia, unspecified: Secondary | ICD-10-CM | POA: Diagnosis not present

## 2017-04-30 DIAGNOSIS — R32 Unspecified urinary incontinence: Secondary | ICD-10-CM | POA: Diagnosis not present

## 2017-04-30 DIAGNOSIS — Z7189 Other specified counseling: Secondary | ICD-10-CM | POA: Diagnosis not present

## 2017-04-30 DIAGNOSIS — Z1211 Encounter for screening for malignant neoplasm of colon: Secondary | ICD-10-CM | POA: Diagnosis not present

## 2017-04-30 DIAGNOSIS — Z299 Encounter for prophylactic measures, unspecified: Secondary | ICD-10-CM | POA: Diagnosis not present

## 2017-04-30 DIAGNOSIS — Z79899 Other long term (current) drug therapy: Secondary | ICD-10-CM | POA: Diagnosis not present

## 2017-04-30 DIAGNOSIS — Z1339 Encounter for screening examination for other mental health and behavioral disorders: Secondary | ICD-10-CM | POA: Diagnosis not present

## 2017-04-30 DIAGNOSIS — Z1331 Encounter for screening for depression: Secondary | ICD-10-CM | POA: Diagnosis not present

## 2017-04-30 DIAGNOSIS — I639 Cerebral infarction, unspecified: Secondary | ICD-10-CM | POA: Diagnosis not present

## 2017-05-09 DIAGNOSIS — E7800 Pure hypercholesterolemia, unspecified: Secondary | ICD-10-CM | POA: Diagnosis not present

## 2017-05-09 DIAGNOSIS — F0390 Unspecified dementia without behavioral disturbance: Secondary | ICD-10-CM | POA: Diagnosis not present

## 2017-05-09 DIAGNOSIS — Z713 Dietary counseling and surveillance: Secondary | ICD-10-CM | POA: Diagnosis not present

## 2017-05-09 DIAGNOSIS — Z299 Encounter for prophylactic measures, unspecified: Secondary | ICD-10-CM | POA: Diagnosis not present

## 2017-05-09 DIAGNOSIS — G4730 Sleep apnea, unspecified: Secondary | ICD-10-CM | POA: Diagnosis not present

## 2017-05-09 DIAGNOSIS — E1165 Type 2 diabetes mellitus with hyperglycemia: Secondary | ICD-10-CM | POA: Diagnosis not present

## 2017-05-09 DIAGNOSIS — I10 Essential (primary) hypertension: Secondary | ICD-10-CM | POA: Diagnosis not present

## 2017-06-02 DIAGNOSIS — Z8673 Personal history of transient ischemic attack (TIA), and cerebral infarction without residual deficits: Secondary | ICD-10-CM | POA: Diagnosis not present

## 2017-06-02 DIAGNOSIS — Z808 Family history of malignant neoplasm of other organs or systems: Secondary | ICD-10-CM | POA: Diagnosis not present

## 2017-06-02 DIAGNOSIS — Z79899 Other long term (current) drug therapy: Secondary | ICD-10-CM | POA: Diagnosis not present

## 2017-06-02 DIAGNOSIS — I252 Old myocardial infarction: Secondary | ICD-10-CM | POA: Diagnosis not present

## 2017-06-02 DIAGNOSIS — G4730 Sleep apnea, unspecified: Secondary | ICD-10-CM | POA: Diagnosis present

## 2017-06-02 DIAGNOSIS — R079 Chest pain, unspecified: Secondary | ICD-10-CM | POA: Diagnosis not present

## 2017-06-02 DIAGNOSIS — F0390 Unspecified dementia without behavioral disturbance: Secondary | ICD-10-CM | POA: Diagnosis present

## 2017-06-02 DIAGNOSIS — E119 Type 2 diabetes mellitus without complications: Secondary | ICD-10-CM | POA: Diagnosis present

## 2017-06-02 DIAGNOSIS — I10 Essential (primary) hypertension: Secondary | ICD-10-CM | POA: Diagnosis not present

## 2017-06-02 DIAGNOSIS — E7800 Pure hypercholesterolemia, unspecified: Secondary | ICD-10-CM | POA: Diagnosis present

## 2017-06-02 DIAGNOSIS — Z7984 Long term (current) use of oral hypoglycemic drugs: Secondary | ICD-10-CM | POA: Diagnosis not present

## 2017-06-02 DIAGNOSIS — E559 Vitamin D deficiency, unspecified: Secondary | ICD-10-CM | POA: Diagnosis present

## 2017-06-02 DIAGNOSIS — Z7982 Long term (current) use of aspirin: Secondary | ICD-10-CM | POA: Diagnosis not present

## 2017-06-02 DIAGNOSIS — R001 Bradycardia, unspecified: Secondary | ICD-10-CM | POA: Diagnosis present

## 2017-06-02 DIAGNOSIS — I214 Non-ST elevation (NSTEMI) myocardial infarction: Secondary | ICD-10-CM | POA: Diagnosis present

## 2017-06-13 DIAGNOSIS — E1165 Type 2 diabetes mellitus with hyperglycemia: Secondary | ICD-10-CM | POA: Diagnosis not present

## 2017-06-13 DIAGNOSIS — E7800 Pure hypercholesterolemia, unspecified: Secondary | ICD-10-CM | POA: Diagnosis not present

## 2017-06-13 DIAGNOSIS — Z6841 Body Mass Index (BMI) 40.0 and over, adult: Secondary | ICD-10-CM | POA: Diagnosis not present

## 2017-06-13 DIAGNOSIS — I639 Cerebral infarction, unspecified: Secondary | ICD-10-CM | POA: Diagnosis not present

## 2017-06-13 DIAGNOSIS — I10 Essential (primary) hypertension: Secondary | ICD-10-CM | POA: Diagnosis not present

## 2017-06-13 DIAGNOSIS — R32 Unspecified urinary incontinence: Secondary | ICD-10-CM | POA: Diagnosis not present

## 2017-06-13 DIAGNOSIS — R079 Chest pain, unspecified: Secondary | ICD-10-CM | POA: Diagnosis not present

## 2017-06-13 DIAGNOSIS — Z299 Encounter for prophylactic measures, unspecified: Secondary | ICD-10-CM | POA: Diagnosis not present

## 2017-06-13 DIAGNOSIS — G4730 Sleep apnea, unspecified: Secondary | ICD-10-CM | POA: Diagnosis not present

## 2017-06-27 ENCOUNTER — Ambulatory Visit (INDEPENDENT_AMBULATORY_CARE_PROVIDER_SITE_OTHER): Payer: Medicare Other | Admitting: Physician Assistant

## 2017-06-27 ENCOUNTER — Encounter: Payer: Self-pay | Admitting: Physician Assistant

## 2017-06-27 ENCOUNTER — Encounter: Payer: Self-pay | Admitting: *Deleted

## 2017-06-27 VITALS — BP 134/76 | HR 78 | Ht 60.0 in | Wt 230.0 lb

## 2017-06-27 DIAGNOSIS — I10 Essential (primary) hypertension: Secondary | ICD-10-CM

## 2017-06-27 DIAGNOSIS — R0602 Shortness of breath: Secondary | ICD-10-CM

## 2017-06-27 DIAGNOSIS — E1110 Type 2 diabetes mellitus with ketoacidosis without coma: Secondary | ICD-10-CM

## 2017-06-27 DIAGNOSIS — I639 Cerebral infarction, unspecified: Secondary | ICD-10-CM

## 2017-06-27 NOTE — Progress Notes (Signed)
Cardiology Office Note    Date:  06/27/2017   ID:  Jessica, Strong April 15, 1946, MRN [id number]  PCP:  Jessica Chroman, MD  Cardiologist: Jessica Dolly, MD  Chief Complaint  Patient presents with  . Hospitalization Follow-up    History of Present Illness:  Jessica Strong is a 72 y.o. female with history of normal left and right heart catheterization 11/2015, normal LVEF with grade 1 DD, hypertension, HLD, DM 2, previous CVA.  Last saw Dr. Harl Bowie 01/2016 with chronic dyspnea on exertion felt secondary to deconditioning.  Patient had recent hospitalization at [facility]  with what they called a NSTEMI admitted with chest pain with troponins of  0.02, 0.08, 0.12, 0.16, 0.19.  2D echo 06/05/17 reviewed and showed normal LVEF 55-60% left atrium is mildly dilated right ventricle had global systolic function is normal. No WMA.  No beta-blocker given because of bradycardia.  Patient comes in today accompanied by her husband and daughter.  Patient is tearful and daughter is wanting to know what tests we have done even though this is her first time evaluating her since hospitalization.  Patient's main complaint is left arm tingling and pain that is constant.  She denies any chest tightness, pressure.  She has chronic dyspnea on exertion that the family said she never had before although all the notes in the past have documented that has been her main complaint.  They say it has worsened.    Past Medical History:  Diagnosis Date  . Allergy   . Anemia   . Anxiety   . Arthritis   . Cataract   . cervical ca 1990  . Diabetes ([place])   . Hypercholesterolemia   . Hypertension   . Myocardial infarction ([place])   . Sleep apnea    on CPAP 14 mm PS since February 2017  . Stroke ([place])   . Vitamin D deficiency     Past Surgical History:  Procedure Laterality Date  . ABDOMINAL HYSTERECTOMY    . BARIATRIC SURGERY    . CARDIAC CATHETERIZATION N/A 12/24/2015   Procedure: Right/Left Heart Cath  and Coronary Angiography;  Surgeon: Jessica Booze, MD;  Location: [facility];  Service: Cardiovascular;  Laterality: N/A;  . HERNIA REPAIR      Current Medications: Current Meds  Medication Sig  . ACCU-CHEK AVIVA PLUS test strip Inject 1 strip as directed daily.  . clopidogrel (PLAVIX) 75 MG tablet Take 75 mg by mouth daily.  [name] donepezil (ARICEPT) 10 MG tablet Take 10 mg by mouth at bedtime.  . Dulaglutide (TRULICITY) 1.09 NA/3.5TD SOPN Inject 0.5 mLs into the skin once a week.  . Empagliflozin-Linagliptin (GLYXAMBI) 10-5 MG TABS Take 1 tablet by mouth daily.  [name] EPINEPHrine (EPIPEN 2-PAK) 0.3 mg/0.3 mL IJ SOAJ injection Inject 0.3 mg into the muscle once.  . ergocalciferol (VITAMIN D2) 50000 units capsule Take 50,000 Units by mouth once a week.  . ferrous sulfate 325 (65 FE) MG tablet Take 325 mg by mouth daily with breakfast.  . glimepiride (AMARYL) 4 MG tablet Take 4 mg by mouth 2 (two) times daily.  . Lancets Thin MISC Inject 1 Stick into the skin daily.  [name] lisinopril-hydrochlorothiazide (PRINZIDE,ZESTORETIC) 20-12.5 MG tablet Take 1 tablet by mouth 2 (two) times daily.   . metFORMIN (GLUCOPHAGE) 1000 MG tablet Take 1 tablet (1,000 mg total) by mouth 2 (two) times daily.  . Potassium 95 MG TABS Take 1 tablet by mouth daily.  . simvastatin (  ZOCOR) 20 MG tablet Take 20 mg by mouth daily.     Allergies:   Peanut-containing drug products and Penicillins   Social History   Socioeconomic History  . Marital status: Married    Spouse name: None  . Number of children: 2  . Years of education: None  . Highest education level: None  Social Needs  . Financial resource strain: None  . Food insecurity - worry: None  . Food insecurity - inability: None  . Transportation needs - medical: None  . Transportation needs - non-medical: None  Occupational History  . Occupation: retired  Tobacco Use  . Smoking status: Never Smoker  . Smokeless tobacco: Never Used  Substance and  Sexual Activity  . Alcohol use: No    Alcohol/week: 0.0 oz    Comment: a drink about every six months  . Drug use: No  . Sexual activity: No    Partners: Male  Other Topics Concern  . None  Social History Narrative  . None     Family History:  The patient's family history includes Colon cancer in her maternal grandmother, mother, and son; Diabetes in her father and mother; Stomach cancer in her paternal grandfather; Stroke in her maternal grandfather; Thyroid cancer in her father.   ROS:   Please see the history of present illness.    Review of Systems  Musculoskeletal: Positive for arthritis.   All other systems reviewed and are negative.   PHYSICAL EXAM:   VS:  BP 134/76   Pulse 78   Ht 5' (1.524 m)   Wt 230 lb (104.3 kg)   SpO2 97%   BMI 44.92 kg/m   Physical Exam  GEN: Obese, in no acute distress  Neck: no JVD, carotid bruits, or masses Cardiac:RRR; no murmurs, rubs, or gallops  Respiratory:  clear to auscultation bilaterally, normal work of breathing GI: soft, nontender, nondistended, + BS Ext: without cyanosis, clubbing, or edema, Good distal pulses bilaterally Neuro:  Alert and Oriented x 3,  Psych: euthymic mood, full affect  Wt Readings from Last 3 Encounters:  06/27/17 230 lb (104.3 kg)  02/12/17 231 lb (104.8 kg)  11/30/16 231 lb 8 oz (105 kg)      Studies/Labs Reviewed:   EKG:  EKG is ordered today.  The ekg reviewed from [facility] hospital normal sinus rhythm nonspecific ST-T wave changes, no acute change Recent Labs: No results found for requested labs within last 8760 hours.   Lipid Panel No results found for: CHOL, TRIG, HDL, CHOLHDL, VLDL, LDLCALC, LDLDIRECT  Additional studies/ records that were reviewed today include:  See records reviewed from [facility]. 10/2015 echo Study Conclusions   - Left ventricle: The cavity size was normal. Systolic function was   normal. The estimated ejection fraction was in the range of 60%   to  65%. Mild concentric and moderate focal basal septal   hypertrophy. Wall motion was normal; there were no regional wall   motion abnormalities. Doppler parameters are consistent with   abnormal left ventricular relaxation (grade 1 diastolic   dysfunction). - Aortic valve: Trileaflet; mildly thickened leaflets.   11/2015 Cath  LV end diastolic pressure is normal.  Normal right heart pressures.  Low pulmonary artery saturation, 52%. CO 3.9 L/min; Cardiac index 2.0  No angiographically apparent coronary artery disease.  Tortuous right innominate by selective angiogram.   Normal LVEF by echo.  Unclear etiology of low PA saturation.  No ventriculogram due to renal insufficiency.  Continue medical  therapy, treatment of OSA and weight loss.      ASSESSMENT:    1. SOB (shortness of breath)   2. Shortness of breath   3. Essential hypertension   4. Cerebrovascular accident (CVA), unspecified mechanism ([place])   5. DM (diabetes mellitus) type 2, uncontrolled, with ketoacidosis ([place])   6. Morbid obesity ([place])      PLAN:  In order of problems listed above:  Dyspnea on exertion which patient has had in the past but family thinks it has worsened.  Recent 2D echo with normal LVEF and right heart.  Right and left heart cath in 2017 normal.  Recent hospitalization at [facility]  with elevated troponins but flat as dictated above.  Patient's only symptom is chronic left arm numbness.  No EKG changes.  We will proceed with Lexiscan Myoview.  If this is normal recommend follow-up with Dr. Woody Seller for other causes of left arm numbness.  Suspect most her dyspnea on exertion is secondary to obesity and deconditioning.  Follow-up with Dr. Harl Bowie in [place] hypertension controlled  History of CVA on long-term Plavix  Diabetes mellitus patient said is better controlled  Morbid obesity weight loss program discussed    Medication Adjustments/Labs and Tests Ordered: Current medicines are  reviewed at length with the patient today.  Concerns regarding medicines are outlined above.  Medication changes, Labs and Tests ordered today are listed in the Patient Instructions below. Patient Instructions  Medication Instructions:  Your physician recommends that you continue on your current medications as directed. Please refer to the Current Medication list given to you today.   Labwork: NONE   Testing/Procedures: Your physician has requested that you have a lexiscan myoview. For further information please visit [email or web address]. Please follow instruction sheet, as given.   Follow-Up: Your physician recommends that you schedule a follow-up appointment in [facility] office with Dr. Harl Bowie after stress test.    Any Other Special Instructions Will Be Listed Below (If Applicable).     If you need a refill on your cardiac medications before your next appointment, please call your pharmacy.  Thank you for choosing [facility]!      Signed, Ermalinda Barrios, PA-C  06/27/2017 1:54 PM    [facility] [address], [place], [place]  [address] Phone: [phone]; Fax: [phone]

## 2017-06-27 NOTE — Patient Instructions (Signed)
Medication Instructions:  Your physician recommends that you continue on your current medications as directed. Please refer to the Current Medication list given to you today.   Labwork: NONE   Testing/Procedures: Your physician has requested that you have a lexiscan myoview. For further information please visit [email or web address]. Please follow instruction sheet, as given.   Follow-Up: Your physician recommends that you schedule a follow-up appointment in [facility] office with Dr. Harl Bowie after stress test.    Any Other Special Instructions Will Be Listed Below (If Applicable).     If you need a refill on your cardiac medications before your next appointment, please call your pharmacy.  Thank you for choosing [facility]!

## 2017-07-04 ENCOUNTER — Encounter (HOSPITAL_BASED_OUTPATIENT_CLINIC_OR_DEPARTMENT_OTHER)
Admission: RE | Admit: 2017-07-04 | Discharge: 2017-07-04 | Disposition: A | Discharge status: Home / Self Care | Payer: Medicare Other | Source: Ambulatory Visit | Attending: Physician Assistant | Admitting: Physician Assistant

## 2017-07-04 ENCOUNTER — Telehealth: Payer: Self-pay | Admitting: Physician Assistant

## 2017-07-04 ENCOUNTER — Ambulatory Visit (HOSPITAL_COMMUNITY)
Admission: RE | Admit: 2017-07-04 | Discharge: 2017-07-04 | Disposition: A | Discharge status: Home / Self Care | Payer: Medicare Other | Source: Ambulatory Visit | Attending: Physician Assistant | Admitting: Physician Assistant

## 2017-07-04 ENCOUNTER — Encounter (HOSPITAL_COMMUNITY): Payer: Self-pay

## 2017-07-04 DIAGNOSIS — R0602 Shortness of breath: Secondary | ICD-10-CM | POA: Diagnosis not present

## 2017-07-04 DIAGNOSIS — R931 Abnormal findings on diagnostic imaging of heart and coronary circulation: Secondary | ICD-10-CM | POA: Diagnosis not present

## 2017-07-04 LAB — NM MYOCAR MULTI W/SPECT W/WALL MOTION / EF
CHL CUP RESTING HR STRESS: 63 {beats}/min
CSEPPHR: 92 {beats}/min
LHR: 0.25
LV dias vol: 60 mL (ref 46–106)
LV sys vol: 14 mL
NUC STRESS TID: 1.17
SDS: 5
SRS: 0
SSS: 5

## 2017-07-04 MED ORDER — REGADENOSON 0.4 MG/5ML IV SOLN
INTRAVENOUS | Status: AC
  Administered 2017-07-04: 0.4 mg via INTRAVENOUS
  Filled 2017-07-04: qty 5

## 2017-07-04 MED ORDER — SODIUM CHLORIDE 0.9% FLUSH
INTRAVENOUS | Status: AC
  Administered 2017-07-04: 10 mL via INTRAVENOUS
  Filled 2017-07-04: qty 10

## 2017-07-04 MED ORDER — TECHNETIUM TC 99M TETROFOSMIN IV KIT
30.0000 | PACK | Freq: Once | INTRAVENOUS | Status: AC | PRN
  Administered 2017-07-04: 32 via INTRAVENOUS

## 2017-07-04 MED ORDER — TECHNETIUM TC 99M TETROFOSMIN IV KIT
10.0000 | PACK | Freq: Once | INTRAVENOUS | Status: AC | PRN
  Administered 2017-07-04: 10.87 via INTRAVENOUS

## 2017-07-04 NOTE — Telephone Encounter (Signed)
Tried to call patient to advise of appointment on 3/11 @ 9:40. Message stated patient was not accepting calls at this time. / tg

## 2017-07-04 NOTE — Telephone Encounter (Signed)
-----   Message from Imogene Burn, PA-C sent at 07/04/2017  2:58 PM EST ----- Patient with low to intermediate risk stress test with some ischemia.  Please have patient see Dr. Harl Bowie in the next week for possible cardiac catheterization.  Had recent hospitalization at [facility] with NSTEMI.  Please see my note.

## 2017-07-05 ENCOUNTER — Telehealth: Payer: Self-pay | Admitting: Cardiology

## 2017-07-05 NOTE — Telephone Encounter (Signed)
Called patient again to advise of appointment. Still not accepting phone calls. Left message on husband's voicemail as well. Will mail letter with appointment details. / tg

## 2017-07-05 NOTE — Telephone Encounter (Signed)
-----   Message from Imogene Burn, PA-C sent at 07/04/2017  2:58 PM EST ----- Patient with low to intermediate risk stress test with some ischemia.  Please have patient see Dr. Harl Bowie in the next week for possible cardiac catheterization.  Had recent hospitalization at [facility] with NSTEMI.  Please see my note.

## 2017-07-05 NOTE — Telephone Encounter (Signed)
Pt notified by Mitzie Na of appt.

## 2017-07-09 ENCOUNTER — Encounter: Payer: Self-pay | Admitting: Cardiology

## 2017-07-09 ENCOUNTER — Ambulatory Visit (INDEPENDENT_AMBULATORY_CARE_PROVIDER_SITE_OTHER): Payer: Medicare Other | Admitting: Cardiology

## 2017-07-09 ENCOUNTER — Other Ambulatory Visit (HOSPITAL_COMMUNITY)
Admission: RE | Admit: 2017-07-09 | Discharge: 2017-07-09 | Disposition: A | Discharge status: Home / Self Care | Payer: Medicare Other | Source: Ambulatory Visit | Attending: Cardiology | Admitting: Cardiology

## 2017-07-09 ENCOUNTER — Ambulatory Visit (HOSPITAL_COMMUNITY)
Admission: RE | Admit: 2017-07-09 | Discharge: 2017-07-09 | Disposition: A | Discharge status: Home / Self Care | Payer: Medicare Other | Source: Ambulatory Visit | Attending: Cardiology | Admitting: Cardiology

## 2017-07-09 VITALS — BP 130/90 | HR 73 | Ht 60.0 in | Wt 232.8 lb

## 2017-07-09 DIAGNOSIS — R9439 Abnormal result of other cardiovascular function study: Secondary | ICD-10-CM | POA: Diagnosis not present

## 2017-07-09 DIAGNOSIS — R0602 Shortness of breath: Secondary | ICD-10-CM | POA: Diagnosis not present

## 2017-07-09 DIAGNOSIS — I639 Cerebral infarction, unspecified: Secondary | ICD-10-CM | POA: Diagnosis not present

## 2017-07-09 DIAGNOSIS — R0789 Other chest pain: Secondary | ICD-10-CM

## 2017-07-09 DIAGNOSIS — Z01818 Encounter for other preprocedural examination: Secondary | ICD-10-CM | POA: Diagnosis not present

## 2017-07-09 DIAGNOSIS — R079 Chest pain, unspecified: Secondary | ICD-10-CM | POA: Diagnosis not present

## 2017-07-09 LAB — BASIC METABOLIC PANEL WITH GFR
Anion gap: 14 (ref 5–15)
BUN: 18 mg/dL (ref 6–20)
CO2: 21 mmol/L — ABNORMAL LOW (ref 22–32)
Calcium: 9.1 mg/dL (ref 8.9–10.3)
Chloride: 105 mmol/L (ref 101–111)
Creatinine, Ser: 1.28 mg/dL — ABNORMAL HIGH (ref 0.44–1.00)
GFR calc Af Amer: 48 mL/min — ABNORMAL LOW
GFR calc non Af Amer: 41 mL/min — ABNORMAL LOW
Glucose, Bld: 149 mg/dL — ABNORMAL HIGH (ref 65–99)
Potassium: 4.2 mmol/L (ref 3.5–5.1)
Sodium: 140 mmol/L (ref 135–145)

## 2017-07-09 LAB — CBC WITH DIFFERENTIAL/PLATELET
Basophils Absolute: 0 K/uL (ref 0.0–0.1)
Basophils Relative: 0 %
Eosinophils Absolute: 0.2 K/uL (ref 0.0–0.7)
Eosinophils Relative: 3 %
HCT: 38.2 % (ref 36.0–46.0)
Hemoglobin: 11.5 g/dL — ABNORMAL LOW (ref 12.0–15.0)
Lymphocytes Relative: 24 %
Lymphs Abs: 1.5 K/uL (ref 0.7–4.0)
MCH: 25.7 pg — ABNORMAL LOW (ref 26.0–34.0)
MCHC: 30.1 g/dL (ref 30.0–36.0)
MCV: 85.5 fL (ref 78.0–100.0)
Monocytes Absolute: 0.5 K/uL (ref 0.1–1.0)
Monocytes Relative: 7 %
Neutro Abs: 4.2 K/uL (ref 1.7–7.7)
Neutrophils Relative %: 66 %
Platelets: 315 K/uL (ref 150–400)
RBC: 4.47 MIL/uL (ref 3.87–5.11)
RDW: 14.9 % (ref 11.5–15.5)
WBC: 6.4 K/uL (ref 4.0–10.5)

## 2017-07-09 LAB — PROTIME-INR
INR: 0.85
Prothrombin Time: 11.5 s (ref 11.4–15.2)

## 2017-07-09 NOTE — Patient Instructions (Addendum)
   [place] [address] Dept: [phone] Loc: [place]  07/09/2017  You are scheduled for a Cardiac Catheterization on Thursday, March 14 with Dr. Daneen Schick.  1. Please arrive at the [facility] (Main Entrance A) at [facility]: [address] [place], [place] [address] at 8:00 AM (two hours before your procedure to ensure your preparation). Free valet parking service is available.   Special note: Every effort is made to have your procedure done on time. Please understand that emergencies sometimes delay scheduled procedures.  2. Diet: Do not eat or drink anything after midnight prior to your procedure except sips of water to take medications.  3. Labs: get Lab work TODAY and chest X-ray  4. Medication instructions in preparation for your procedure:     Stop taking, Glucophage (Metformin) on Wednesday, March 13.  Hold day before procedure, day of cath, and for 2 days after  HOLD ALL DIABETIC MEDICATIONS THE MORNING OF YOUR CATH  HOLD LISINOPRIL/HCTZ THE MORNING OF CATH  ON THE MORNING of your procedure, take your Aspirin 81 MG and any morning medicines NOT listed above.  You may use sips of water.  5. Plan for one night stay--bring personal belongings. 6. Bring a current list of your medications and current insurance cards. 7. You MUST have a responsible person to drive you home. 8. Someone MUST be with you the first 24 hours after you arrive home or your discharge will be delayed. 9. Please wear clothes that are easy to get on and off and wear slip-on shoes.  Thank you for allowing [place] to care for you!   -- [facility]

## 2017-07-09 NOTE — Progress Notes (Signed)
Clinical Summary Jessica Strong is a 72 y.o.female  seen today for follow up of the following medical problems. This is a focused visit on chest pain and recent abnormal stress test.    1. SOB/Chest pain - SOB x 6 months. DOE walking room to room at home. Denies any LE edema.  - can have some occasional chest pain. Started about 6 months ago. Occurs with exertion, aching pain 4/10. Better with rest. Not positional. Increase in frequency since onset, no increase in severity.  - secondhand smoke exposure with husband. +cough at times. Occasional wheezing. Since last visit she had normal PFTs.  - echo 12/2012 [facility]: technically difficult. LVEF 55-60%, abnormal diastolic function.  - negative exercise cardiolite in 2016 - 10/2015 echo LVEF 29-93%, grade I diastolic dysfunction  LHC/RHC in 11/2015. No significant CAD, normal filling pressures. PCWP 7, mean PA 15.   - recent 06/2017 [facility] [place] wth chest pain.Mild trop elevation up to 0.19 (did not establish clear peak). Echo at that time LVEF 55-60%.  - still with left arm numbness, comes and goes. Several hours or days in row. No recent chest pain. +fatigue - DOE with limited activity. Which has increased.  06/2017 nuclear stress: mild to moderate anterior ischemia, low to intermediate risk   Past Medical History:  Diagnosis Date  . Allergy   . Anemia   . Anxiety   . Arthritis   . Cataract   . cervical ca 1990  . Diabetes ([place])   . Hypercholesterolemia   . Hypertension   . Myocardial infarction ([place])   . Sleep apnea    on CPAP 14 mm PS since February 2017  . Stroke ([place])   . Vitamin D deficiency      Allergies  Allergen Reactions  . Peanut-Containing Drug Products Rash  . Penicillins Rash    Has patient had a PCN reaction causing immediate rash, facial/tongue/throat swelling, SOB or lightheadedness with hypotension: Yes Has patient had a PCN reaction causing severe rash involving mucus membranes or skin  necrosis: No Has patient had a PCN reaction that required hospitalization No Has patient had a PCN reaction occurring within the last 10 years: No. Has not had in 40-50 years If all of the above answers are "NO", then may proceed with Cephalosporin use.     Current Outpatient Medications  Medication Sig Dispense Refill  . ACCU-CHEK AVIVA PLUS test strip Inject 1 strip as directed daily.  4  . clopidogrel (PLAVIX) 75 MG tablet Take 75 mg by mouth daily.    [name] donepezil (ARICEPT) 10 MG tablet Take 10 mg by mouth at bedtime.    . Dulaglutide (TRULICITY) 7.16 RC/7.8LF SOPN Inject 0.5 mLs into the skin once a week.    . Empagliflozin-Linagliptin (GLYXAMBI) 10-5 MG TABS Take 1 tablet by mouth daily.    [name] EPINEPHrine (EPIPEN 2-PAK) 0.3 mg/0.3 mL IJ SOAJ injection Inject 0.3 mg into the muscle once.    . ergocalciferol (VITAMIN D2) 50000 units capsule Take 50,000 Units by mouth once a week.    . ferrous sulfate 325 (65 FE) MG tablet Take 325 mg by mouth daily with breakfast.    . glimepiride (AMARYL) 4 MG tablet Take 4 mg by mouth 2 (two) times daily.    . Lancets Thin MISC Inject 1 Stick into the skin daily.    [name] lisinopril-hydrochlorothiazide (PRINZIDE,ZESTORETIC) 20-12.5 MG tablet Take 1 tablet by mouth 2 (two) times daily.     . metFORMIN (  GLUCOPHAGE) 1000 MG tablet Take 1 tablet (1,000 mg total) by mouth 2 (two) times daily.    . Potassium 95 MG TABS Take 1 tablet by mouth daily.    . simvastatin (ZOCOR) 20 MG tablet Take 20 mg by mouth daily.     No current facility-administered medications for this visit.      Past Surgical History:  Procedure Laterality Date  . ABDOMINAL HYSTERECTOMY    . BARIATRIC SURGERY    . CARDIAC CATHETERIZATION N/A 12/24/2015   Procedure: Right/Left Heart Cath and Coronary Angiography;  Surgeon: Jettie Booze, MD;  Location: [facility];  Service: Cardiovascular;  Laterality: N/A;  . HERNIA REPAIR       Allergies  Allergen Reactions  .  Peanut-Containing Drug Products Rash  . Penicillins Rash    Has patient had a PCN reaction causing immediate rash, facial/tongue/throat swelling, SOB or lightheadedness with hypotension: Yes Has patient had a PCN reaction causing severe rash involving mucus membranes or skin necrosis: No Has patient had a PCN reaction that required hospitalization No Has patient had a PCN reaction occurring within the last 10 years: No. Has not had in 40-50 years If all of the above answers are "NO", then may proceed with Cephalosporin use.      Family History  Problem Relation Age of Onset  . Thyroid cancer Father   . Diabetes Father   . Colon cancer Mother   . Diabetes Mother   . Colon cancer Maternal Grandmother   . Stroke Maternal Grandfather   . Stomach cancer Paternal Grandfather   . Colon cancer Son      Social History Ms. Moore reports that  has never smoked. she has never used smokeless tobacco. Ms. Livingston reports that she does not drink alcohol.   Review of Systems CONSTITUTIONAL: No weight loss, fever, chills, weakness or fatigue.  HEENT: Eyes: No visual loss, blurred vision, double vision or yellow sclerae.No hearing loss, sneezing, congestion, runny nose or sore throat.  SKIN: No rash or itching.  CARDIOVASCULAR: per hpi RESPIRATORY:per hpi GASTROINTESTINAL: No anorexia, nausea, vomiting or diarrhea. No abdominal pain or blood.  GENITOURINARY: No burning on urination, no polyuria NEUROLOGICAL: No headache, dizziness, syncope, paralysis, ataxia, numbness or tingling in the extremities. No change in bowel or bladder control.  MUSCULOSKELETAL: No muscle, back pain, joint pain or stiffness.  LYMPHATICS: No enlarged nodes. No history of splenectomy.  PSYCHIATRIC: No history of depression or anxiety.  ENDOCRINOLOGIC: No reports of sweating, cold or heat intolerance. No polyuria or polydipsia.  [name]   Physical Examination Vitals:   07/09/17 0942  BP: 130/90  Pulse: 73  SpO2: 96%    Vitals:   07/09/17 0942  Weight: 232 lb 12.8 oz (105.6 kg)  Height: 5' (1.524 m)    Gen: resting comfortably, no acute distress HEENT: no scleral icterus, pupils equal round and reactive, no palptable cervical adenopathy,  CV: RRR, no m/r/g, no jvd Resp: Clear to auscultation bilaterally GI: abdomen is soft, non-tender, non-distended, normal bowel sounds, no hepatosplenomegaly MSK: extremities are warm, no edema.  Skin: warm, no rash Neuro:  no focal deficits Psych: appropriate affect   Diagnostic Studies 10/2015 echo Study Conclusions  - Left ventricle: The cavity size was normal. Systolic function was normal. The estimated ejection fraction was in the range of 60% to 65%. Mild concentric and moderate focal basal septal hypertrophy. Wall motion was normal; there were no regional wall motion abnormalities. Doppler parameters are consistent with abnormal left  ventricular relaxation (grade 1 diastolic dysfunction). - Aortic valve: Trileaflet; mildly thickened leaflets.  11/2015 Cath  LV end diastolic pressure is normal.  Normal right heart pressures.  Low pulmonary artery saturation, 52%. CO 3.9 L/min; Cardiac index 2.0  No angiographically apparent coronary artery disease.  Tortuous right innominate by selective angiogram.  Normal LVEF by echo. Unclear etiology of low PA saturation. No ventriculogram due to renal insufficiency. Continue medical therapy, treatment of OSA and weight loss.  06/2017 nuclear stress  There was no ST segment deviation noted during stress.  The left ventricular ejection fraction is hyperdynamic (>65%).  Findings consistent with mild to moderate anterior ischemia.  Low to intermediate risk stress test.   Assessment and Plan  1. Chest pain/SOB - recent atypical chest pain. At [facility] admission trop up to 0.19 without a clear peak, no alternative etiology established - recent nuclear stress test with mild to moderate  ischemia - despite her atypical symptoms and negative cath 11/2015, with her recent elevated troponin and abnormal nuclear stress she will require a repeat cath at this time  I have reviewed the risks, indications, and alternatives to cardiac catheterization, possible angioplasty, and stenting with the patient and her daughter today. Risks include but are not limited to bleeding, infection, vascular injury, stroke, myocardial infection, arrhythmia, kidney injury, radiation-related injury in the case of prolonged fluoroscopy use, emergency cardiac surgery, and death. The patient understands the risks of serious complication is 1-2 in 9449 with diagnostic cardiac cath and 1-2% or less with angioplasty/stenting.       [name], M.D

## 2017-07-09 NOTE — H&P (View-Only) (Signed)
Clinical Summary Ms. Shoun is a 72 y.o.female  seen today for follow up of the following medical problems. This is a focused visit on chest pain and recent abnormal stress test.    1. SOB/Chest pain - SOB x 6 months. DOE walking room to room at home. Denies any LE edema.  - can have some occasional chest pain. Started about 6 months ago. Occurs with exertion, aching pain 4/10. Better with rest. Not positional. Increase in frequency since onset, no increase in severity.  - secondhand smoke exposure with husband. +cough at times. Occasional wheezing. Since last visit she had normal PFTs.  - echo 12/2012 [facility]: technically difficult. LVEF 55-60%, abnormal diastolic function.  - negative exercise cardiolite in 2016 - 10/2015 echo LVEF 10-25%, grade I diastolic dysfunction  LHC/RHC in 11/2015. No significant CAD, normal filling pressures. PCWP 7, mean PA 15.   - recent 06/2017 [facility] [place] wth chest pain.Mild trop elevation up to 0.19 (did not establish clear peak). Echo at that time LVEF 55-60%.  - still with left arm numbness, comes and goes. Several hours or days in row. No recent chest pain. +fatigue - DOE with limited activity. Which has increased.  06/2017 nuclear stress: mild to moderate anterior ischemia, low to intermediate risk   Past Medical History:  Diagnosis Date  . Allergy   . Anemia   . Anxiety   . Arthritis   . Cataract   . cervical ca 1990  . Diabetes ([place])   . Hypercholesterolemia   . Hypertension   . Myocardial infarction ([place])   . Sleep apnea    on CPAP 14 mm PS since February 2017  . Stroke ([place])   . Vitamin D deficiency      Allergies  Allergen Reactions  . Peanut-Containing Drug Products Rash  . Penicillins Rash    Has patient had a PCN reaction causing immediate rash, facial/tongue/throat swelling, SOB or lightheadedness with hypotension: Yes Has patient had a PCN reaction causing severe rash involving mucus membranes or skin  necrosis: No Has patient had a PCN reaction that required hospitalization No Has patient had a PCN reaction occurring within the last 10 years: No. Has not had in 40-50 years If all of the above answers are "NO", then may proceed with Cephalosporin use.     Current Outpatient Medications  Medication Sig Dispense Refill  . ACCU-CHEK AVIVA PLUS test strip Inject 1 strip as directed daily.  4  . clopidogrel (PLAVIX) 75 MG tablet Take 75 mg by mouth daily.    [name] donepezil (ARICEPT) 10 MG tablet Take 10 mg by mouth at bedtime.    . Dulaglutide (TRULICITY) 8.52 DP/8.2UM SOPN Inject 0.5 mLs into the skin once a week.    . Empagliflozin-Linagliptin (GLYXAMBI) 10-5 MG TABS Take 1 tablet by mouth daily.    [name] EPINEPHrine (EPIPEN 2-PAK) 0.3 mg/0.3 mL IJ SOAJ injection Inject 0.3 mg into the muscle once.    . ergocalciferol (VITAMIN D2) 50000 units capsule Take 50,000 Units by mouth once a week.    . ferrous sulfate 325 (65 FE) MG tablet Take 325 mg by mouth daily with breakfast.    . glimepiride (AMARYL) 4 MG tablet Take 4 mg by mouth 2 (two) times daily.    . Lancets Thin MISC Inject 1 Stick into the skin daily.    [name] lisinopril-hydrochlorothiazide (PRINZIDE,ZESTORETIC) 20-12.5 MG tablet Take 1 tablet by mouth 2 (two) times daily.     . metFORMIN (  GLUCOPHAGE) 1000 MG tablet Take 1 tablet (1,000 mg total) by mouth 2 (two) times daily.    . Potassium 95 MG TABS Take 1 tablet by mouth daily.    . simvastatin (ZOCOR) 20 MG tablet Take 20 mg by mouth daily.     No current facility-administered medications for this visit.      Past Surgical History:  Procedure Laterality Date  . ABDOMINAL HYSTERECTOMY    . BARIATRIC SURGERY    . CARDIAC CATHETERIZATION N/A 12/24/2015   Procedure: Right/Left Heart Cath and Coronary Angiography;  Surgeon: Jettie Booze, MD;  Location: [place] [facility];  Service: Cardiovascular;  Laterality: N/A;  . HERNIA REPAIR       Allergies  Allergen Reactions  .  Peanut-Containing Drug Products Rash  . Penicillins Rash    Has patient had a PCN reaction causing immediate rash, facial/tongue/throat swelling, SOB or lightheadedness with hypotension: Yes Has patient had a PCN reaction causing severe rash involving mucus membranes or skin necrosis: No Has patient had a PCN reaction that required hospitalization No Has patient had a PCN reaction occurring within the last 10 years: No. Has not had in 40-50 years If all of the above answers are "NO", then may proceed with Cephalosporin use.      Family History  Problem Relation Age of Onset  . Thyroid cancer Father   . Diabetes Father   . Colon cancer Mother   . Diabetes Mother   . Colon cancer Maternal Grandmother   . Stroke Maternal Grandfather   . Stomach cancer Paternal Grandfather   . Colon cancer Son      Social History Ms. Godeaux reports that  has never smoked. she has never used smokeless tobacco. Ms. Ritchey reports that she does not drink alcohol.   Review of Systems CONSTITUTIONAL: No weight loss, fever, chills, weakness or fatigue.  HEENT: Eyes: No visual loss, blurred vision, double vision or yellow sclerae.No hearing loss, sneezing, congestion, runny nose or sore throat.  SKIN: No rash or itching.  CARDIOVASCULAR: per hpi RESPIRATORY:per hpi GASTROINTESTINAL: No anorexia, nausea, vomiting or diarrhea. No abdominal pain or blood.  GENITOURINARY: No burning on urination, no polyuria NEUROLOGICAL: No headache, dizziness, syncope, paralysis, ataxia, numbness or tingling in the extremities. No change in bowel or bladder control.  MUSCULOSKELETAL: No muscle, back pain, joint pain or stiffness.  LYMPHATICS: No enlarged nodes. No history of splenectomy.  PSYCHIATRIC: No history of depression or anxiety.  ENDOCRINOLOGIC: No reports of sweating, cold or heat intolerance. No polyuria or polydipsia.  [name]   Physical Examination Vitals:   07/09/17 0942  BP: 130/90  Pulse: 73  SpO2: 96%    Vitals:   07/09/17 0942  Weight: 232 lb 12.8 oz (105.6 kg)  Height: 5' (1.524 m)    Gen: resting comfortably, no acute distress HEENT: no scleral icterus, pupils equal round and reactive, no palptable cervical adenopathy,  CV: RRR, no m/r/g, no jvd Resp: Clear to auscultation bilaterally GI: abdomen is soft, non-tender, non-distended, normal bowel sounds, no hepatosplenomegaly MSK: extremities are warm, no edema.  Skin: warm, no rash Neuro:  no focal deficits Psych: appropriate affect   Diagnostic Studies 10/2015 echo Study Conclusions  - Left ventricle: The cavity size was normal. Systolic function was normal. The estimated ejection fraction was in the range of 60% to 65%. Mild concentric and moderate focal basal septal hypertrophy. Wall motion was normal; there were no regional wall motion abnormalities. Doppler parameters are consistent with abnormal left  ventricular relaxation (grade 1 diastolic dysfunction). - Aortic valve: Trileaflet; mildly thickened leaflets.  11/2015 Cath  LV end diastolic pressure is normal.  Normal right heart pressures.  Low pulmonary artery saturation, 52%. CO 3.9 L/min; Cardiac index 2.0  No angiographically apparent coronary artery disease.  Tortuous right innominate by selective angiogram.  Normal LVEF by echo. Unclear etiology of low PA saturation. No ventriculogram due to renal insufficiency. Continue medical therapy, treatment of OSA and weight loss.  06/2017 nuclear stress  There was no ST segment deviation noted during stress.  The left ventricular ejection fraction is hyperdynamic (>65%).  Findings consistent with mild to moderate anterior ischemia.  Low to intermediate risk stress test.   Assessment and Plan  1. Chest pain/SOB - recent atypical chest pain. At [facility] admission trop up to 0.19 without a clear peak, no alternative etiology established - recent nuclear stress test with mild to moderate  ischemia - despite her atypical symptoms and negative cath 11/2015, with her recent elevated troponin and abnormal nuclear stress she will require a repeat cath at this time  I have reviewed the risks, indications, and alternatives to cardiac catheterization, possible angioplasty, and stenting with the patient and her daughter today. Risks include but are not limited to bleeding, infection, vascular injury, stroke, myocardial infection, arrhythmia, kidney injury, radiation-related injury in the case of prolonged fluoroscopy use, emergency cardiac surgery, and death. The patient understands the risks of serious complication is 1-2 in 9509 with diagnostic cardiac cath and 1-2% or less with angioplasty/stenting.       [name], M.D

## 2017-07-10 ENCOUNTER — Other Ambulatory Visit: Payer: Self-pay | Admitting: Cardiology

## 2017-07-10 DIAGNOSIS — R0789 Other chest pain: Secondary | ICD-10-CM

## 2017-07-11 ENCOUNTER — Telehealth: Payer: Self-pay | Admitting: *Deleted

## 2017-07-11 NOTE — H&P (Signed)
   Previously negative ischemic evaluations including left and right heart catheterization in 2017.  Recent nuclear study read low to moderate probability and questioning mild anterior ischemia performed in March 2019.  Because of continued difficulty with shortness of breath coronary angiography has been recommended.

## 2017-07-11 NOTE — Telephone Encounter (Signed)
Catheterization scheduled at [facility] for: Thursday March 14,2019 10:30 AM Arrival time and place: [place] Entrance A/[place] at: 8 AM  Nothing to eat or drink after midnight the night prior to cath.  Hold: Metformin 07/11/17, 07/12/17 and 48 hours post cath. Glimepiride AM of cath. Glyxambi AM of cath. Lisinopril-HCTZ AM of cath.  AM meds can be taken pre-cath with sips of water including: ASA 81 mg Clopidogrel 75 mg   Responsible person to drive patient home observe for 24 hours-yes  I discussed instructions with patient, she verbalized understanding.

## 2017-07-12 ENCOUNTER — Ambulatory Visit (HOSPITAL_COMMUNITY)
Admission: RE | Disposition: A | Discharge status: Home / Self Care | Payer: Self-pay | Source: Ambulatory Visit | Attending: Interventional Cardiology

## 2017-07-12 ENCOUNTER — Ambulatory Visit (HOSPITAL_COMMUNITY)
Admission: RE | Admit: 2017-07-12 | Discharge: 2017-07-12 | Disposition: A | Discharge status: Home / Self Care | Payer: Medicare Other | Source: Ambulatory Visit | Attending: Interventional Cardiology | Admitting: Interventional Cardiology

## 2017-07-12 ENCOUNTER — Telehealth: Payer: Self-pay

## 2017-07-12 DIAGNOSIS — I10 Essential (primary) hypertension: Secondary | ICD-10-CM | POA: Diagnosis present

## 2017-07-12 DIAGNOSIS — R931 Abnormal findings on diagnostic imaging of heart and coronary circulation: Secondary | ICD-10-CM | POA: Insufficient documentation

## 2017-07-12 DIAGNOSIS — Z823 Family history of stroke: Secondary | ICD-10-CM | POA: Insufficient documentation

## 2017-07-12 DIAGNOSIS — I252 Old myocardial infarction: Secondary | ICD-10-CM | POA: Diagnosis not present

## 2017-07-12 DIAGNOSIS — I639 Cerebral infarction, unspecified: Secondary | ICD-10-CM | POA: Diagnosis present

## 2017-07-12 DIAGNOSIS — M1990 Unspecified osteoarthritis, unspecified site: Secondary | ICD-10-CM | POA: Diagnosis not present

## 2017-07-12 DIAGNOSIS — E7800 Pure hypercholesterolemia, unspecified: Secondary | ICD-10-CM | POA: Insufficient documentation

## 2017-07-12 DIAGNOSIS — G4733 Obstructive sleep apnea (adult) (pediatric): Secondary | ICD-10-CM | POA: Diagnosis not present

## 2017-07-12 DIAGNOSIS — F419 Anxiety disorder, unspecified: Secondary | ICD-10-CM | POA: Insufficient documentation

## 2017-07-12 DIAGNOSIS — Z800 Family history of malignant neoplasm of digestive organs: Secondary | ICD-10-CM | POA: Insufficient documentation

## 2017-07-12 DIAGNOSIS — Z808 Family history of malignant neoplasm of other organs or systems: Secondary | ICD-10-CM | POA: Insufficient documentation

## 2017-07-12 DIAGNOSIS — Z833 Family history of diabetes mellitus: Secondary | ICD-10-CM | POA: Insufficient documentation

## 2017-07-12 DIAGNOSIS — R0609 Other forms of dyspnea: Secondary | ICD-10-CM | POA: Diagnosis present

## 2017-07-12 DIAGNOSIS — E119 Type 2 diabetes mellitus without complications: Secondary | ICD-10-CM | POA: Diagnosis not present

## 2017-07-12 DIAGNOSIS — Z79899 Other long term (current) drug therapy: Secondary | ICD-10-CM | POA: Diagnosis not present

## 2017-07-12 DIAGNOSIS — Z8541 Personal history of malignant neoplasm of cervix uteri: Secondary | ICD-10-CM | POA: Insufficient documentation

## 2017-07-12 DIAGNOSIS — Z7984 Long term (current) use of oral hypoglycemic drugs: Secondary | ICD-10-CM | POA: Diagnosis not present

## 2017-07-12 DIAGNOSIS — R0602 Shortness of breath: Secondary | ICD-10-CM

## 2017-07-12 DIAGNOSIS — E1110 Type 2 diabetes mellitus with ketoacidosis without coma: Secondary | ICD-10-CM | POA: Diagnosis present

## 2017-07-12 DIAGNOSIS — M79602 Pain in left arm: Secondary | ICD-10-CM | POA: Diagnosis not present

## 2017-07-12 DIAGNOSIS — Z880 Allergy status to penicillin: Secondary | ICD-10-CM | POA: Insufficient documentation

## 2017-07-12 DIAGNOSIS — Z9049 Acquired absence of other specified parts of digestive tract: Secondary | ICD-10-CM | POA: Insufficient documentation

## 2017-07-12 DIAGNOSIS — R0600 Dyspnea, unspecified: Secondary | ICD-10-CM | POA: Insufficient documentation

## 2017-07-12 DIAGNOSIS — Z91010 Allergy to peanuts: Secondary | ICD-10-CM | POA: Insufficient documentation

## 2017-07-12 DIAGNOSIS — E559 Vitamin D deficiency, unspecified: Secondary | ICD-10-CM | POA: Diagnosis not present

## 2017-07-12 DIAGNOSIS — Z9884 Bariatric surgery status: Secondary | ICD-10-CM | POA: Insufficient documentation

## 2017-07-12 DIAGNOSIS — D649 Anemia, unspecified: Secondary | ICD-10-CM | POA: Insufficient documentation

## 2017-07-12 DIAGNOSIS — N289 Disorder of kidney and ureter, unspecified: Secondary | ICD-10-CM | POA: Diagnosis not present

## 2017-07-12 DIAGNOSIS — Z8673 Personal history of transient ischemic attack (TIA), and cerebral infarction without residual deficits: Secondary | ICD-10-CM | POA: Diagnosis not present

## 2017-07-12 DIAGNOSIS — R0789 Other chest pain: Secondary | ICD-10-CM

## 2017-07-12 DIAGNOSIS — I679 Cerebrovascular disease, unspecified: Secondary | ICD-10-CM | POA: Diagnosis present

## 2017-07-12 HISTORY — PX: LEFT HEART CATH AND CORONARY ANGIOGRAPHY: CATH118249

## 2017-07-12 LAB — GLUCOSE, CAPILLARY: Glucose-Capillary: 190 mg/dL — ABNORMAL HIGH (ref 65–99)

## 2017-07-12 SURGERY — LEFT HEART CATH AND CORONARY ANGIOGRAPHY
Anesthesia: LOCAL

## 2017-07-12 MED ORDER — IOPAMIDOL (ISOVUE-370) INJECTION 76%
INTRAVENOUS | Status: DC | PRN
  Administered 2017-07-12: 85 mL

## 2017-07-12 MED ORDER — SODIUM CHLORIDE 0.9% FLUSH: 3.0000 mL | Freq: Two times a day (BID) | INTRAVENOUS | Status: DC

## 2017-07-12 MED ORDER — HEPARIN SODIUM (PORCINE) 1000 UNIT/ML IJ SOLN
INTRAMUSCULAR | Status: AC
  Filled 2017-07-12: qty 1

## 2017-07-12 MED ORDER — SODIUM CHLORIDE 0.9 % WEIGHT BASED INFUSION: 1.0000 mL/kg/h | INTRAVENOUS | Status: DC

## 2017-07-12 MED ORDER — ACETAMINOPHEN 325 MG PO TABS: 650.0000 mg | ORAL_TABLET | ORAL | Status: DC | PRN

## 2017-07-12 MED ORDER — FENTANYL CITRATE (PF) 100 MCG/2ML IJ SOLN
INTRAMUSCULAR | Status: DC | PRN
  Administered 2017-07-12: 50 ug via INTRAVENOUS

## 2017-07-12 MED ORDER — MIDAZOLAM HCL 2 MG/2ML IJ SOLN
INTRAMUSCULAR | Status: AC
  Filled 2017-07-12: qty 2

## 2017-07-12 MED ORDER — VERAPAMIL HCL 2.5 MG/ML IV SOLN
INTRAVENOUS | Status: DC | PRN
  Administered 2017-07-12: 10 mL via INTRA_ARTERIAL

## 2017-07-12 MED ORDER — HEPARIN SODIUM (PORCINE) 1000 UNIT/ML IJ SOLN
INTRAMUSCULAR | Status: DC | PRN
  Administered 2017-07-12: 5000 [IU] via INTRAVENOUS

## 2017-07-12 MED ORDER — LIDOCAINE HCL (PF) 1 % IJ SOLN
INTRAMUSCULAR | Status: DC | PRN
  Administered 2017-07-12: 2 mL

## 2017-07-12 MED ORDER — HEPARIN (PORCINE) IN NACL 2-0.9 UNIT/ML-% IJ SOLN
INTRAMUSCULAR | Status: AC | PRN
  Administered 2017-07-12 (×2): 500 mL

## 2017-07-12 MED ORDER — SODIUM CHLORIDE 0.9 % WEIGHT BASED INFUSION
3.0000 mL/kg/h | INTRAVENOUS | Status: DC
  Administered 2017-07-12: 3 mL/kg/h via INTRAVENOUS

## 2017-07-12 MED ORDER — VERAPAMIL HCL 2.5 MG/ML IV SOLN
INTRAVENOUS | Status: AC
  Filled 2017-07-12: qty 2

## 2017-07-12 MED ORDER — OXYCODONE HCL 5 MG PO TABS: 5.0000 mg | ORAL_TABLET | ORAL | Status: DC | PRN

## 2017-07-12 MED ORDER — SODIUM CHLORIDE 0.9 % IV SOLN: 250.0000 mL | INTRAVENOUS | Status: DC | PRN

## 2017-07-12 MED ORDER — ONDANSETRON HCL 4 MG/2ML IJ SOLN: 4.0000 mg | Freq: Four times a day (QID) | INTRAMUSCULAR | Status: DC | PRN

## 2017-07-12 MED ORDER — FENTANYL CITRATE (PF) 100 MCG/2ML IJ SOLN
INTRAMUSCULAR | Status: AC
  Filled 2017-07-12: qty 2

## 2017-07-12 MED ORDER — ASPIRIN 81 MG PO CHEW: 81.0000 mg | CHEWABLE_TABLET | ORAL | Status: DC

## 2017-07-12 MED ORDER — MIDAZOLAM HCL 2 MG/2ML IJ SOLN
INTRAMUSCULAR | Status: DC | PRN
  Administered 2017-07-12: 1 mg via INTRAVENOUS

## 2017-07-12 MED ORDER — IOPAMIDOL (ISOVUE-370) INJECTION 76%
INTRAVENOUS | Status: AC
  Filled 2017-07-12: qty 100

## 2017-07-12 MED ORDER — CLOPIDOGREL BISULFATE 75 MG PO TABS
ORAL_TABLET | ORAL | Status: AC
  Filled 2017-07-12: qty 1

## 2017-07-12 MED ORDER — HEPARIN (PORCINE) IN NACL 2-0.9 UNIT/ML-% IJ SOLN
INTRAMUSCULAR | Status: AC
  Filled 2017-07-12: qty 1000

## 2017-07-12 MED ORDER — SODIUM CHLORIDE 0.9% FLUSH: 3.0000 mL | INTRAVENOUS | Status: DC | PRN

## 2017-07-12 MED ORDER — LIDOCAINE HCL 1 % IJ SOLN
INTRAMUSCULAR | Status: AC
  Filled 2017-07-12: qty 20

## 2017-07-12 MED ORDER — CLOPIDOGREL BISULFATE 75 MG PO TABS
75.0000 mg | ORAL_TABLET | Freq: Once | ORAL | Status: AC
  Administered 2017-07-12: 75 mg via ORAL

## 2017-07-12 MED ORDER — ASPIRIN 81 MG PO CHEW
CHEWABLE_TABLET | ORAL | Status: AC
  Filled 2017-07-12: qty 1

## 2017-07-12 MED ORDER — ASPIRIN 81 MG PO CHEW
81.0000 mg | CHEWABLE_TABLET | ORAL | Status: AC
  Administered 2017-07-12: 81 mg via ORAL

## 2017-07-12 SURGICAL SUPPLY — 14 items
BAND ZEPHYR COMPRESS 30 LONG (HEMOSTASIS) ×2 IMPLANT
CATH INFINITI 5 FR JL3.5 (CATHETERS) ×2 IMPLANT
CATH INFINITI JR4 5F (CATHETERS) ×2 IMPLANT
CATH LAUNCHER 5F RADR (CATHETERS) ×1 IMPLANT
CATHETER LAUNCHER 5F RADR (CATHETERS) ×2
COVER PRB 48X5XTLSCP FOLD TPE (BAG) ×1 IMPLANT
COVER PROBE 5X48 (BAG) ×1
GLIDESHEATH SLEND A-KIT 6F 22G (SHEATH) ×2 IMPLANT
GUIDEWIRE INQWIRE 1.5J.035X260 (WIRE) ×1 IMPLANT
INQWIRE 1.5J .035X260CM (WIRE) ×2
KIT HEART LEFT (KITS) ×2 IMPLANT
PACK CARDIAC CATHETERIZATION (CUSTOM PROCEDURE TRAY) ×2 IMPLANT
TRANSDUCER W/STOPCOCK (MISCELLANEOUS) ×2 IMPLANT
TUBING CIL FLEX 10 FLL-RA (TUBING) ×2 IMPLANT

## 2017-07-12 NOTE — Interval H&P Note (Signed)
Cath Lab Visit (complete for each Cath Lab visit)  Clinical Evaluation Leading to the Procedure:   ACS: No.  Non-ACS:    Anginal Classification: CCS Strong  Anti-ischemic medical therapy: Maximal Therapy (2 or more classes of medications)  Non-Invasive Test Results: Intermediate-risk stress test findings: cardiac mortality 1-3%/year  Prior CABG: No previous CABG      History and Physical Interval Note:  07/12/2017 12:16 PM  [name]  has presented today for surgery, with the diagnosis of cp  The various methods of treatment have been discussed with the patient and family. After consideration of risks, benefits and other options for treatment, the patient has consented to  Procedure(s): LEFT HEART CATH AND CORONARY ANGIOGRAPHY (N/A) as a surgical intervention .  The patient's history has been reviewed, patient examined, no change in status, stable for surgery.  I have reviewed the patient's chart and labs.  Questions were answered to the patient's satisfaction.     Jessica Strong

## 2017-07-12 NOTE — Telephone Encounter (Signed)
-----   Message from [name], MD sent at 07/11/2017  3:36 PM EDT ----- Labs overall look good. Renal function mildly decreased by improved since last check Zandra Abts MD

## 2017-07-12 NOTE — Telephone Encounter (Signed)
Called pt., no answer, no voicemail. I will return call.

## 2017-07-12 NOTE — Discharge Instructions (Signed)

## 2017-07-12 NOTE — Research (Signed)
OPTIMIZE Research Study Informed Consent   Subject Name: Jessica Strong  Subject met inclusion and exclusion criteria.  The informed consent form, study requirements and expectations were reviewed with the subject and questions and concerns were addressed prior to the signing of the consent form.  The subject verbalized understanding of the trial requirements.  The subject agreed to participate in the OPTIMIZE trial and signed the informed consent.  The informed consent was obtained prior to performance of any protocol-specific procedures for the subject.  A copy of the signed informed consent was given to the subject and a copy was placed in the subject's medical record. The subject will only be enrolled if the angiographic criteria is met.`  Blossom Hoops 07/12/2017, 12:06 PM

## 2017-07-13 ENCOUNTER — Telehealth: Payer: Self-pay

## 2017-07-13 ENCOUNTER — Encounter (HOSPITAL_COMMUNITY): Payer: Self-pay | Admitting: Interventional Cardiology

## 2017-07-13 MED FILL — Lidocaine HCl Local Inj 1%: INTRAMUSCULAR | Qty: 20 | Status: AC

## 2017-07-13 MED FILL — Heparin Sodium (Porcine) 2 Unit/ML in Sodium Chloride 0.9%: INTRAMUSCULAR | Qty: 1000 | Status: AC

## 2017-07-13 NOTE — Telephone Encounter (Signed)
Called pt. No answer, no voicemail. I will mail pt letter to inform of normal  Results.

## 2017-07-18 DIAGNOSIS — E1122 Type 2 diabetes mellitus with diabetic chronic kidney disease: Secondary | ICD-10-CM | POA: Diagnosis not present

## 2017-07-18 DIAGNOSIS — E1165 Type 2 diabetes mellitus with hyperglycemia: Secondary | ICD-10-CM | POA: Diagnosis not present

## 2017-07-18 DIAGNOSIS — R32 Unspecified urinary incontinence: Secondary | ICD-10-CM | POA: Diagnosis not present

## 2017-07-18 DIAGNOSIS — Z6841 Body Mass Index (BMI) 40.0 and over, adult: Secondary | ICD-10-CM | POA: Diagnosis not present

## 2017-07-18 DIAGNOSIS — N183 Chronic kidney disease, stage 3 (moderate): Secondary | ICD-10-CM | POA: Diagnosis not present

## 2017-07-18 DIAGNOSIS — Z299 Encounter for prophylactic measures, unspecified: Secondary | ICD-10-CM | POA: Diagnosis not present

## 2017-08-08 ENCOUNTER — Ambulatory Visit: Payer: Medicare Other | Admitting: Cardiology

## 2017-08-13 ENCOUNTER — Ambulatory Visit (INDEPENDENT_AMBULATORY_CARE_PROVIDER_SITE_OTHER): Payer: Medicare Other | Admitting: Cardiology

## 2017-08-13 ENCOUNTER — Encounter: Payer: Self-pay | Admitting: Cardiology

## 2017-08-13 VITALS — BP 128/80 | HR 77 | Ht 60.0 in | Wt 230.8 lb

## 2017-08-13 DIAGNOSIS — I639 Cerebral infarction, unspecified: Secondary | ICD-10-CM

## 2017-08-13 DIAGNOSIS — R0789 Other chest pain: Secondary | ICD-10-CM

## 2017-08-13 DIAGNOSIS — R0602 Shortness of breath: Secondary | ICD-10-CM | POA: Diagnosis not present

## 2017-08-13 NOTE — Patient Instructions (Signed)

## 2017-08-13 NOTE — Progress Notes (Signed)
Clinical Summary Ms. Chavero is a 72 y.o.female seen today for follow up of the following medical problems.   1. SOB/Chest pain - secondhand smoke exposure with husband. +cough at times. Occasional wheezing. Since last visit she had normal PFTs.  - echo 12/2012 [facility] Medicine: technically difficult. LVEF 55-60%, abnormal diastolic function.  - negative exercise cardiolite in 2016 - 10/2015 echo LVEF 81-15%, grade I diastolic dysfunction  LHC/RHC in 11/2015. No significant CAD, normal filling pressures. PCWP 7, mean PA 15.  - 06/2017 [facility] wth chest pain.Mild trop elevation up to 0.19 (did not establish clear peak). Echo at that time LVEF 55-60%.  06/2017 nuclear stress: mild to moderate anterior ischemia, low to intermediate risk - 06/2017 cath normal coronaries.    - since last visit denies any significant chest pain. No SOB/DOE.   Past Medical History:  Diagnosis Date  . Allergy   . Anemia   . Anxiety   . Arthritis   . Cataract   . cervical ca 1990  . Diabetes ([place])   . Hypercholesterolemia   . Hypertension   . Myocardial infarction ([place])   . Sleep apnea    on CPAP 14 mm PS since February 2017  . Stroke ([place])   . Vitamin D deficiency      Allergies  Allergen Reactions  . Peanut-Containing Drug Products Rash  . Penicillins Rash    Has patient had a PCN reaction causing immediate rash, facial/tongue/throat swelling, SOB or lightheadedness with hypotension: Yes Has patient had a PCN reaction causing severe rash involving mucus membranes or skin necrosis: No Has patient had a PCN reaction that required hospitalization No Has patient had a PCN reaction occurring within the last 10 years: No. Has not had in 40-50 years If all of the above answers are "NO", then may proceed with Cephalosporin use.     Current Outpatient Medications  Medication Sig Dispense Refill  . ACCU-CHEK AVIVA PLUS test strip Inject 1 strip as directed daily.  4  . Calcium  Carb-Cholecalciferol (CALCIUM 600-D PO) Take 1 tablet by mouth 3 (three) times a week.    . clopidogrel (PLAVIX) 75 MG tablet Take 75 mg by mouth every evening.     . donepezil (ARICEPT) 10 MG tablet Take 10 mg by mouth at bedtime.    . Dulaglutide (TRULICITY) 7.26 OM/3.5DH SOPN Inject 0.5 mLs into the skin every Friday.     . Empagliflozin-Linagliptin (GLYXAMBI) 25-5 MG TABS Take 1 tablet by mouth every evening.    [name] EPINEPHrine (EPIPEN 2-PAK) 0.3 mg/0.3 mL IJ SOAJ injection Inject 0.3 mg into the muscle as needed (allergic reaction).     [name] ergocalciferol (VITAMIN D2) 50000 units capsule Take 50,000 Units by mouth once a week.    . ferrous sulfate 325 (65 FE) MG tablet Take 325 mg by mouth daily with breakfast.    . glimepiride (AMARYL) 4 MG tablet Take 4 mg by mouth 2 (two) times daily.    . Lancets Thin MISC Inject 1 Stick into the skin daily.    [name] lisinopril-hydrochlorothiazide (PRINZIDE,ZESTORETIC) 20-12.5 MG tablet Take 1 tablet by mouth daily.     . memantine (NAMENDA XR) 14 MG CP24 24 hr capsule Take 14 mg by mouth every evening.     . metFORMIN (GLUCOPHAGE) 1000 MG tablet Take 1 tablet (1,000 mg total) by mouth 2 (two) times daily.    . Multiple Vitamins-Minerals (MULTIVITAMIN ADULT) CHEW Chew 2 tablets by mouth daily at 12  noon.    . MYRBETRIQ 25 MG TB24 tablet Take 25 mg by mouth every evening.   4  . Potassium 95 MG TABS Take 95 mg by mouth every evening.     . simvastatin (ZOCOR) 20 MG tablet Take 20 mg by mouth every evening.      No current facility-administered medications for this visit.      Past Surgical History:  Procedure Laterality Date  . ABDOMINAL HYSTERECTOMY    . BARIATRIC SURGERY    . CARDIAC CATHETERIZATION N/A 12/24/2015   Procedure: Right/Left Heart Cath and Coronary Angiography;  Surgeon: Jettie Booze, MD;  Location: [place] [facility];  Service: Cardiovascular;  Laterality: N/A;  . HERNIA REPAIR    . LEFT HEART CATH AND CORONARY ANGIOGRAPHY N/A  07/12/2017   Procedure: LEFT HEART CATH AND CORONARY ANGIOGRAPHY;  Surgeon: Belva Crome, MD;  Location: [place] [facility];  Service: Cardiovascular;  Laterality: N/A;     Allergies  Allergen Reactions  . Peanut-Containing Drug Products Rash  . Penicillins Rash    Has patient had a PCN reaction causing immediate rash, facial/tongue/throat swelling, SOB or lightheadedness with hypotension: Yes Has patient had a PCN reaction causing severe rash involving mucus membranes or skin necrosis: No Has patient had a PCN reaction that required hospitalization No Has patient had a PCN reaction occurring within the last 10 years: No. Has not had in 40-50 years If all of the above answers are "NO", then may proceed with Cephalosporin use.      Family History  Problem Relation Age of Onset  . Thyroid cancer Father   . Diabetes Father   . Colon cancer Mother   . Diabetes Mother   . Colon cancer Maternal Grandmother   . Stroke Maternal Grandfather   . Stomach cancer Paternal Grandfather   . Colon cancer Son      Social History Ms. Gierke reports that she has never smoked. She has never used smokeless tobacco. Ms. Lyne reports that she does not drink alcohol.   Review of Systems CONSTITUTIONAL: No weight loss, fever, chills, weakness or fatigue.  HEENT: Eyes: No visual loss, blurred vision, double vision or yellow sclerae.No hearing loss, sneezing, congestion, runny nose or sore throat.  SKIN: No rash or itching.  CARDIOVASCULAR: per hpi RESPIRATORY: per hpi GASTROINTESTINAL: No anorexia, nausea, vomiting or diarrhea. No abdominal pain or blood.  GENITOURINARY: No burning on urination, no polyuria NEUROLOGICAL: No headache, dizziness, syncope, paralysis, ataxia, numbness or tingling in the extremities. No change in bowel or bladder control.  MUSCULOSKELETAL: No muscle, back pain, joint pain or stiffness.  LYMPHATICS: No enlarged nodes. No history of splenectomy.  PSYCHIATRIC: No history  of depression or anxiety.  ENDOCRINOLOGIC: No reports of sweating, cold or heat intolerance. No polyuria or polydipsia.  [name]   Physical Examination Vitals:   08/13/17 1417  BP: 128/80  Pulse: 77  SpO2: 92%   Vitals:   08/13/17 1417  Weight: 230 lb 12.8 oz (104.7 kg)  Height: 5' (1.524 m)    Gen: resting comfortably, no acute distress HEENT: no scleral icterus, pupils equal round and reactive, no palptable cervical adenopathy,  CV: RRR, no m/r/g, no jvd Resp: Clear to auscultation bilaterally GI: abdomen is soft, non-tender, non-distended, normal bowel sounds, no hepatosplenomegaly MSK: extremities are warm, no edema.  Skin: warm, no rash Neuro:  no focal deficits Psych: appropriate affect   Diagnostic Studies  10/2015 echo Study Conclusions  - Left ventricle: The cavity  size was normal. Systolic function was normal. The estimated ejection fraction was in the range of 60% to 65%. Mild concentric and moderate focal basal septal hypertrophy. Wall motion was normal; there were no regional wall motion abnormalities. Doppler parameters are consistent with abnormal left ventricular relaxation (grade 1 diastolic dysfunction). - Aortic valve: Trileaflet; mildly thickened leaflets.  11/2015 Cath  LV end diastolic pressure is normal.  Normal right heart pressures.  Low pulmonary artery saturation, 52%. CO 3.9 L/min; Cardiac index 2.0  No angiographically apparent coronary artery disease.  Tortuous right innominate by selective angiogram.  Normal LVEF by echo. Unclear etiology of low PA saturation. No ventriculogram due to renal insufficiency. Continue medical therapy, treatment of OSA and weight loss.  06/2017 nuclear stress  There was no ST segment deviation noted during stress.  The left ventricular ejection fraction is hyperdynamic (>65%).  Findings consistent with mild to moderate anterior ischemia.  Low to intermediate risk stress  test.  06/2017 cath  Normal coronary arteries.  Normal left ventricular size and function with normal hemodynamics.  False positive nuclear study  RECOMMENDATIONS:   No further cardiac ischemic evaluation.  Nuclear study is felt to be false positive.   Assessment and Plan  1. Chest pain/SOB -long history of symptoms - recent cath with patent coronaries - no recent symptoms, continue to monitor at this time.    F/u 1 year    [name], M.D.

## 2017-08-17 ENCOUNTER — Encounter: Payer: Self-pay | Admitting: Cardiology

## 2017-08-27 DIAGNOSIS — N3946 Mixed incontinence: Secondary | ICD-10-CM | POA: Diagnosis not present

## 2017-08-27 DIAGNOSIS — Z8541 Personal history of malignant neoplasm of cervix uteri: Secondary | ICD-10-CM | POA: Diagnosis not present

## 2017-08-27 DIAGNOSIS — Z90710 Acquired absence of both cervix and uterus: Secondary | ICD-10-CM | POA: Diagnosis not present

## 2017-08-27 DIAGNOSIS — E119 Type 2 diabetes mellitus without complications: Secondary | ICD-10-CM | POA: Diagnosis not present

## 2017-09-20 DIAGNOSIS — E113293 Type 2 diabetes mellitus with mild nonproliferative diabetic retinopathy without macular edema, bilateral: Secondary | ICD-10-CM | POA: Diagnosis not present

## 2017-09-21 DIAGNOSIS — I10 Essential (primary) hypertension: Secondary | ICD-10-CM | POA: Diagnosis not present

## 2017-09-21 DIAGNOSIS — E119 Type 2 diabetes mellitus without complications: Secondary | ICD-10-CM | POA: Diagnosis not present

## 2017-09-21 DIAGNOSIS — I639 Cerebral infarction, unspecified: Secondary | ICD-10-CM | POA: Diagnosis not present

## 2017-10-04 DIAGNOSIS — N3946 Mixed incontinence: Secondary | ICD-10-CM | POA: Diagnosis not present

## 2017-10-15 DIAGNOSIS — Z1231 Encounter for screening mammogram for malignant neoplasm of breast: Secondary | ICD-10-CM | POA: Diagnosis not present

## 2017-10-24 DIAGNOSIS — G4730 Sleep apnea, unspecified: Secondary | ICD-10-CM | POA: Diagnosis not present

## 2017-10-24 DIAGNOSIS — E1122 Type 2 diabetes mellitus with diabetic chronic kidney disease: Secondary | ICD-10-CM | POA: Diagnosis not present

## 2017-10-24 DIAGNOSIS — E7800 Pure hypercholesterolemia, unspecified: Secondary | ICD-10-CM | POA: Diagnosis not present

## 2017-10-24 DIAGNOSIS — E1165 Type 2 diabetes mellitus with hyperglycemia: Secondary | ICD-10-CM | POA: Diagnosis not present

## 2017-10-24 DIAGNOSIS — I10 Essential (primary) hypertension: Secondary | ICD-10-CM | POA: Diagnosis not present

## 2017-10-24 DIAGNOSIS — Z6841 Body Mass Index (BMI) 40.0 and over, adult: Secondary | ICD-10-CM | POA: Diagnosis not present

## 2017-10-24 DIAGNOSIS — Z299 Encounter for prophylactic measures, unspecified: Secondary | ICD-10-CM | POA: Diagnosis not present

## 2017-10-24 DIAGNOSIS — N183 Chronic kidney disease, stage 3 (moderate): Secondary | ICD-10-CM | POA: Diagnosis not present

## 2017-11-12 DIAGNOSIS — N3946 Mixed incontinence: Secondary | ICD-10-CM | POA: Diagnosis not present

## 2017-12-05 DIAGNOSIS — N183 Chronic kidney disease, stage 3 (moderate): Secondary | ICD-10-CM | POA: Diagnosis not present

## 2017-12-05 DIAGNOSIS — Z299 Encounter for prophylactic measures, unspecified: Secondary | ICD-10-CM | POA: Diagnosis not present

## 2017-12-05 DIAGNOSIS — E1165 Type 2 diabetes mellitus with hyperglycemia: Secondary | ICD-10-CM | POA: Diagnosis not present

## 2017-12-05 DIAGNOSIS — Z6841 Body Mass Index (BMI) 40.0 and over, adult: Secondary | ICD-10-CM | POA: Diagnosis not present

## 2017-12-05 DIAGNOSIS — E1122 Type 2 diabetes mellitus with diabetic chronic kidney disease: Secondary | ICD-10-CM | POA: Diagnosis not present

## 2017-12-05 DIAGNOSIS — I10 Essential (primary) hypertension: Secondary | ICD-10-CM | POA: Diagnosis not present

## 2017-12-05 DIAGNOSIS — K5900 Constipation, unspecified: Secondary | ICD-10-CM | POA: Diagnosis not present

## 2017-12-17 DIAGNOSIS — N3946 Mixed incontinence: Secondary | ICD-10-CM | POA: Diagnosis not present

## 2017-12-25 DIAGNOSIS — I10 Essential (primary) hypertension: Secondary | ICD-10-CM | POA: Diagnosis not present

## 2017-12-25 DIAGNOSIS — I639 Cerebral infarction, unspecified: Secondary | ICD-10-CM | POA: Diagnosis not present

## 2017-12-25 DIAGNOSIS — E119 Type 2 diabetes mellitus without complications: Secondary | ICD-10-CM | POA: Diagnosis not present

## 2017-12-27 DIAGNOSIS — G4733 Obstructive sleep apnea (adult) (pediatric): Secondary | ICD-10-CM | POA: Insufficient documentation

## 2017-12-27 DIAGNOSIS — E119 Type 2 diabetes mellitus without complications: Secondary | ICD-10-CM | POA: Diagnosis not present

## 2017-12-27 DIAGNOSIS — N393 Stress incontinence (female) (male): Secondary | ICD-10-CM | POA: Diagnosis not present

## 2017-12-27 DIAGNOSIS — Z9989 Dependence on other enabling machines and devices: Secondary | ICD-10-CM | POA: Insufficient documentation

## 2018-01-05 DIAGNOSIS — N393 Stress incontinence (female) (male): Secondary | ICD-10-CM | POA: Diagnosis not present

## 2018-01-05 DIAGNOSIS — E119 Type 2 diabetes mellitus without complications: Secondary | ICD-10-CM | POA: Diagnosis not present

## 2018-01-05 DIAGNOSIS — N3946 Mixed incontinence: Secondary | ICD-10-CM | POA: Diagnosis not present

## 2018-01-06 DIAGNOSIS — E119 Type 2 diabetes mellitus without complications: Secondary | ICD-10-CM | POA: Diagnosis not present

## 2018-01-06 DIAGNOSIS — N393 Stress incontinence (female) (male): Secondary | ICD-10-CM | POA: Diagnosis not present

## 2018-01-22 DIAGNOSIS — I10 Essential (primary) hypertension: Secondary | ICD-10-CM | POA: Diagnosis not present

## 2018-01-22 DIAGNOSIS — E119 Type 2 diabetes mellitus without complications: Secondary | ICD-10-CM | POA: Diagnosis not present

## 2018-01-22 DIAGNOSIS — I639 Cerebral infarction, unspecified: Secondary | ICD-10-CM | POA: Diagnosis not present

## 2018-01-29 DIAGNOSIS — Z299 Encounter for prophylactic measures, unspecified: Secondary | ICD-10-CM | POA: Diagnosis not present

## 2018-01-29 DIAGNOSIS — I10 Essential (primary) hypertension: Secondary | ICD-10-CM | POA: Diagnosis not present

## 2018-01-29 DIAGNOSIS — N183 Chronic kidney disease, stage 3 (moderate): Secondary | ICD-10-CM | POA: Diagnosis not present

## 2018-01-29 DIAGNOSIS — F0390 Unspecified dementia without behavioral disturbance: Secondary | ICD-10-CM | POA: Diagnosis not present

## 2018-01-29 DIAGNOSIS — E1122 Type 2 diabetes mellitus with diabetic chronic kidney disease: Secondary | ICD-10-CM | POA: Diagnosis not present

## 2018-01-29 DIAGNOSIS — E1165 Type 2 diabetes mellitus with hyperglycemia: Secondary | ICD-10-CM | POA: Diagnosis not present

## 2018-01-29 DIAGNOSIS — Z6841 Body Mass Index (BMI) 40.0 and over, adult: Secondary | ICD-10-CM | POA: Diagnosis not present

## 2018-02-04 DIAGNOSIS — N3946 Mixed incontinence: Secondary | ICD-10-CM | POA: Diagnosis not present

## 2018-02-12 DIAGNOSIS — I10 Essential (primary) hypertension: Secondary | ICD-10-CM | POA: Diagnosis not present

## 2018-02-12 DIAGNOSIS — I639 Cerebral infarction, unspecified: Secondary | ICD-10-CM | POA: Diagnosis not present

## 2018-02-12 DIAGNOSIS — E119 Type 2 diabetes mellitus without complications: Secondary | ICD-10-CM | POA: Diagnosis not present

## 2018-03-12 DIAGNOSIS — I10 Essential (primary) hypertension: Secondary | ICD-10-CM | POA: Diagnosis not present

## 2018-03-12 DIAGNOSIS — I639 Cerebral infarction, unspecified: Secondary | ICD-10-CM | POA: Diagnosis not present

## 2018-03-12 DIAGNOSIS — E119 Type 2 diabetes mellitus without complications: Secondary | ICD-10-CM | POA: Diagnosis not present

## 2018-03-21 DIAGNOSIS — E113293 Type 2 diabetes mellitus with mild nonproliferative diabetic retinopathy without macular edema, bilateral: Secondary | ICD-10-CM | POA: Diagnosis not present

## 2018-03-21 DIAGNOSIS — H3561 Retinal hemorrhage, right eye: Secondary | ICD-10-CM | POA: Diagnosis not present

## 2018-05-06 DIAGNOSIS — Z6841 Body Mass Index (BMI) 40.0 and over, adult: Secondary | ICD-10-CM | POA: Diagnosis not present

## 2018-05-06 DIAGNOSIS — Z1339 Encounter for screening examination for other mental health and behavioral disorders: Secondary | ICD-10-CM | POA: Diagnosis not present

## 2018-05-06 DIAGNOSIS — R5383 Other fatigue: Secondary | ICD-10-CM | POA: Diagnosis not present

## 2018-05-06 DIAGNOSIS — E1165 Type 2 diabetes mellitus with hyperglycemia: Secondary | ICD-10-CM | POA: Diagnosis not present

## 2018-05-06 DIAGNOSIS — Z1211 Encounter for screening for malignant neoplasm of colon: Secondary | ICD-10-CM | POA: Diagnosis not present

## 2018-05-06 DIAGNOSIS — K921 Melena: Secondary | ICD-10-CM | POA: Diagnosis not present

## 2018-05-06 DIAGNOSIS — Z1331 Encounter for screening for depression: Secondary | ICD-10-CM | POA: Diagnosis not present

## 2018-05-06 DIAGNOSIS — I10 Essential (primary) hypertension: Secondary | ICD-10-CM | POA: Diagnosis not present

## 2018-05-06 DIAGNOSIS — Z7189 Other specified counseling: Secondary | ICD-10-CM | POA: Diagnosis not present

## 2018-05-06 DIAGNOSIS — Z79899 Other long term (current) drug therapy: Secondary | ICD-10-CM | POA: Diagnosis not present

## 2018-05-06 DIAGNOSIS — Z299 Encounter for prophylactic measures, unspecified: Secondary | ICD-10-CM | POA: Diagnosis not present

## 2018-05-06 DIAGNOSIS — E7800 Pure hypercholesterolemia, unspecified: Secondary | ICD-10-CM | POA: Diagnosis not present

## 2018-05-06 DIAGNOSIS — Z0000 Encounter for general adult medical examination without abnormal findings: Secondary | ICD-10-CM | POA: Diagnosis not present

## 2018-05-09 DIAGNOSIS — I10 Essential (primary) hypertension: Secondary | ICD-10-CM | POA: Diagnosis not present

## 2018-05-09 DIAGNOSIS — I639 Cerebral infarction, unspecified: Secondary | ICD-10-CM | POA: Diagnosis not present

## 2018-05-09 DIAGNOSIS — E119 Type 2 diabetes mellitus without complications: Secondary | ICD-10-CM | POA: Diagnosis not present

## 2018-06-05 DIAGNOSIS — E119 Type 2 diabetes mellitus without complications: Secondary | ICD-10-CM | POA: Diagnosis not present

## 2018-06-05 DIAGNOSIS — I639 Cerebral infarction, unspecified: Secondary | ICD-10-CM | POA: Diagnosis not present

## 2018-06-05 DIAGNOSIS — I10 Essential (primary) hypertension: Secondary | ICD-10-CM | POA: Diagnosis not present

## 2018-07-03 DIAGNOSIS — I639 Cerebral infarction, unspecified: Secondary | ICD-10-CM | POA: Diagnosis not present

## 2018-07-03 DIAGNOSIS — E119 Type 2 diabetes mellitus without complications: Secondary | ICD-10-CM | POA: Diagnosis not present

## 2018-07-03 DIAGNOSIS — I10 Essential (primary) hypertension: Secondary | ICD-10-CM | POA: Diagnosis not present

## 2018-08-05 DIAGNOSIS — E119 Type 2 diabetes mellitus without complications: Secondary | ICD-10-CM | POA: Diagnosis not present

## 2018-08-05 DIAGNOSIS — I639 Cerebral infarction, unspecified: Secondary | ICD-10-CM | POA: Diagnosis not present

## 2018-08-05 DIAGNOSIS — I10 Essential (primary) hypertension: Secondary | ICD-10-CM | POA: Diagnosis not present

## 2018-08-12 DIAGNOSIS — F0390 Unspecified dementia without behavioral disturbance: Secondary | ICD-10-CM | POA: Diagnosis not present

## 2018-08-12 DIAGNOSIS — Z299 Encounter for prophylactic measures, unspecified: Secondary | ICD-10-CM | POA: Diagnosis not present

## 2018-08-12 DIAGNOSIS — N183 Chronic kidney disease, stage 3 (moderate): Secondary | ICD-10-CM | POA: Diagnosis not present

## 2018-08-12 DIAGNOSIS — E1122 Type 2 diabetes mellitus with diabetic chronic kidney disease: Secondary | ICD-10-CM | POA: Diagnosis not present

## 2018-08-12 DIAGNOSIS — I10 Essential (primary) hypertension: Secondary | ICD-10-CM | POA: Diagnosis not present

## 2018-08-12 DIAGNOSIS — E1165 Type 2 diabetes mellitus with hyperglycemia: Secondary | ICD-10-CM | POA: Diagnosis not present

## 2018-08-12 DIAGNOSIS — Z6841 Body Mass Index (BMI) 40.0 and over, adult: Secondary | ICD-10-CM | POA: Diagnosis not present

## 2018-09-03 DIAGNOSIS — I10 Essential (primary) hypertension: Secondary | ICD-10-CM | POA: Diagnosis not present

## 2018-09-03 DIAGNOSIS — I639 Cerebral infarction, unspecified: Secondary | ICD-10-CM | POA: Diagnosis not present

## 2018-09-03 DIAGNOSIS — E119 Type 2 diabetes mellitus without complications: Secondary | ICD-10-CM | POA: Diagnosis not present

## 2018-09-19 DIAGNOSIS — H25811 Combined forms of age-related cataract, right eye: Secondary | ICD-10-CM | POA: Diagnosis not present

## 2018-09-19 DIAGNOSIS — E113311 Type 2 diabetes mellitus with moderate nonproliferative diabetic retinopathy with macular edema, right eye: Secondary | ICD-10-CM | POA: Diagnosis not present

## 2018-11-15 DIAGNOSIS — N183 Chronic kidney disease, stage 3 (moderate): Secondary | ICD-10-CM | POA: Diagnosis not present

## 2018-11-15 DIAGNOSIS — F0390 Unspecified dementia without behavioral disturbance: Secondary | ICD-10-CM | POA: Diagnosis not present

## 2018-11-15 DIAGNOSIS — Z299 Encounter for prophylactic measures, unspecified: Secondary | ICD-10-CM | POA: Diagnosis not present

## 2018-11-15 DIAGNOSIS — Z6841 Body Mass Index (BMI) 40.0 and over, adult: Secondary | ICD-10-CM | POA: Diagnosis not present

## 2018-11-15 DIAGNOSIS — E1122 Type 2 diabetes mellitus with diabetic chronic kidney disease: Secondary | ICD-10-CM | POA: Diagnosis not present

## 2018-11-15 DIAGNOSIS — E1165 Type 2 diabetes mellitus with hyperglycemia: Secondary | ICD-10-CM | POA: Diagnosis not present

## 2018-11-15 DIAGNOSIS — I10 Essential (primary) hypertension: Secondary | ICD-10-CM | POA: Diagnosis not present

## 2018-11-19 DIAGNOSIS — I10 Essential (primary) hypertension: Secondary | ICD-10-CM | POA: Diagnosis not present

## 2018-11-19 DIAGNOSIS — E119 Type 2 diabetes mellitus without complications: Secondary | ICD-10-CM | POA: Diagnosis not present

## 2018-11-19 DIAGNOSIS — I639 Cerebral infarction, unspecified: Secondary | ICD-10-CM | POA: Diagnosis not present

## 2018-12-18 DIAGNOSIS — E119 Type 2 diabetes mellitus without complications: Secondary | ICD-10-CM | POA: Diagnosis not present

## 2018-12-18 DIAGNOSIS — I639 Cerebral infarction, unspecified: Secondary | ICD-10-CM | POA: Diagnosis not present

## 2018-12-18 DIAGNOSIS — I10 Essential (primary) hypertension: Secondary | ICD-10-CM | POA: Diagnosis not present

## 2018-12-30 DIAGNOSIS — E119 Type 2 diabetes mellitus without complications: Secondary | ICD-10-CM | POA: Diagnosis not present

## 2019-01-15 DIAGNOSIS — E119 Type 2 diabetes mellitus without complications: Secondary | ICD-10-CM | POA: Diagnosis not present

## 2019-01-15 DIAGNOSIS — I10 Essential (primary) hypertension: Secondary | ICD-10-CM | POA: Diagnosis not present

## 2019-01-15 DIAGNOSIS — I639 Cerebral infarction, unspecified: Secondary | ICD-10-CM | POA: Diagnosis not present

## 2019-01-23 DIAGNOSIS — Z1231 Encounter for screening mammogram for malignant neoplasm of breast: Secondary | ICD-10-CM | POA: Diagnosis not present

## 2019-01-27 DIAGNOSIS — E119 Type 2 diabetes mellitus without complications: Secondary | ICD-10-CM | POA: Diagnosis not present

## 2019-02-13 DIAGNOSIS — I639 Cerebral infarction, unspecified: Secondary | ICD-10-CM | POA: Diagnosis not present

## 2019-02-13 DIAGNOSIS — E119 Type 2 diabetes mellitus without complications: Secondary | ICD-10-CM | POA: Diagnosis not present

## 2019-02-13 DIAGNOSIS — I10 Essential (primary) hypertension: Secondary | ICD-10-CM | POA: Diagnosis not present

## 2019-02-24 DIAGNOSIS — I10 Essential (primary) hypertension: Secondary | ICD-10-CM | POA: Diagnosis not present

## 2019-02-24 DIAGNOSIS — Z299 Encounter for prophylactic measures, unspecified: Secondary | ICD-10-CM | POA: Diagnosis not present

## 2019-02-24 DIAGNOSIS — E1122 Type 2 diabetes mellitus with diabetic chronic kidney disease: Secondary | ICD-10-CM | POA: Diagnosis not present

## 2019-02-24 DIAGNOSIS — N183 Chronic kidney disease, stage 3 unspecified: Secondary | ICD-10-CM | POA: Diagnosis not present

## 2019-02-24 DIAGNOSIS — E1165 Type 2 diabetes mellitus with hyperglycemia: Secondary | ICD-10-CM | POA: Diagnosis not present

## 2019-02-24 DIAGNOSIS — Z6841 Body Mass Index (BMI) 40.0 and over, adult: Secondary | ICD-10-CM | POA: Diagnosis not present

## 2019-02-26 DIAGNOSIS — E119 Type 2 diabetes mellitus without complications: Secondary | ICD-10-CM | POA: Diagnosis not present

## 2019-03-07 ENCOUNTER — Encounter: Payer: Self-pay | Admitting: Cardiology

## 2019-03-07 ENCOUNTER — Other Ambulatory Visit: Payer: Self-pay

## 2019-03-07 ENCOUNTER — Ambulatory Visit (INDEPENDENT_AMBULATORY_CARE_PROVIDER_SITE_OTHER): Payer: Medicare Other | Admitting: Cardiology

## 2019-03-07 VITALS — BP 121/81 | HR 72 | Ht 60.0 in | Wt 232.0 lb

## 2019-03-07 DIAGNOSIS — R0602 Shortness of breath: Secondary | ICD-10-CM

## 2019-03-07 DIAGNOSIS — R0789 Other chest pain: Secondary | ICD-10-CM | POA: Diagnosis not present

## 2019-03-07 NOTE — Progress Notes (Signed)
Clinical Summary Jessica Strong is a 73 y.o.female seen today for follow up of the following medical problems.   1. SOB/Chest pain - secondhand smoke exposure with husband. +cough at times. Occasional wheezing. Since last visit she had normal PFTs.  - echo 12/2012 [facility] Medicine: technically difficult. LVEF 55-60%, abnormal diastolic function.  - negative exercise cardiolite in 2016 - 10/2015 echo LVEF 123456, grade I diastolic dysfunction LHC/RHC in 11/2015. No significant CAD, normal filling pressures. PCWP 7, mean PA 15.  - 06/2017 [facility] wth chest pain.Mild trop elevation up to 0.19(did not establish clear peak). Echo at that time LVEF 55-60%. 06/2017 nuclear stress: mild to moderate anterior ischemia, low to intermediate risk - 06/2017 cath normal coronaries.    - no recent chest pain - some increased in SOB. DOE with walking short distances.  - no recent edema.      Past Medical History:  Diagnosis Date  . Allergy   . Anemia   . Anxiety   . Arthritis   . Cataract   . cervical ca 1990  . Diabetes ([place])   . Hypercholesterolemia   . Hypertension   . Myocardial infarction ([place])   . Sleep apnea    on CPAP 14 mm PS since February 2017  . Stroke ([place])   . Vitamin D deficiency      Allergies  Allergen Reactions  . Peanut-Containing Drug Products Rash  . Penicillins Rash    Has patient had a PCN reaction causing immediate rash, facial/tongue/throat swelling, SOB or lightheadedness with hypotension: Yes Has patient had a PCN reaction causing severe rash involving mucus membranes or skin necrosis: No Has patient had a PCN reaction that required hospitalization No Has patient had a PCN reaction occurring within the last 10 years: No. Has not had in 40-50 years If all of the above answers are "NO", then may proceed with Cephalosporin use.     Current Outpatient Medications  Medication Sig Dispense Refill  . ACCU-CHEK AVIVA PLUS test strip Inject 1  strip as directed daily.  4  . Calcium Carb-Cholecalciferol (CALCIUM 600-D PO) Take 1 tablet by mouth 3 (three) times a week.    . clopidogrel (PLAVIX) 75 MG tablet Take 75 mg by mouth every evening.     . donepezil (ARICEPT) 10 MG tablet Take 10 mg by mouth at bedtime.    . Dulaglutide (TRULICITY) A999333 0000000 SOPN Inject 0.5 mLs into the skin every Friday.     . Empagliflozin-Linagliptin (GLYXAMBI) 25-5 MG TABS Take 1 tablet by mouth every evening.    [name] EPINEPHrine (EPIPEN 2-PAK) 0.3 mg/0.3 mL IJ SOAJ injection Inject 0.3 mg into the muscle as needed (allergic reaction).     [name] ergocalciferol (VITAMIN D2) 50000 units capsule Take 50,000 Units by mouth once a week.    . ferrous sulfate 325 (65 FE) MG tablet Take 325 mg by mouth daily with breakfast.    . glimepiride (AMARYL) 4 MG tablet Take 4 mg by mouth 2 (two) times daily.    . Lancets Thin MISC Inject 1 Stick into the skin daily.    [name] lisinopril-hydrochlorothiazide (PRINZIDE,ZESTORETIC) 20-12.5 MG tablet Take 1 tablet by mouth daily.     . memantine (NAMENDA XR) 14 MG CP24 24 hr capsule Take 14 mg by mouth every evening.     . metFORMIN (GLUCOPHAGE) 1000 MG tablet Take 1 tablet (1,000 mg total) by mouth 2 (two) times daily.    . Multiple Vitamins-Minerals (MULTIVITAMIN ADULT)  CHEW Chew 2 tablets by mouth daily at 12 noon.    [name] MYRBETRIQ 25 MG TB24 tablet Take 25 mg by mouth every evening.   4  . Potassium 95 MG TABS Take 95 mg by mouth every evening.     . simvastatin (ZOCOR) 20 MG tablet Take 20 mg by mouth every evening.      No current facility-administered medications for this visit.      Past Surgical History:  Procedure Laterality Date  . ABDOMINAL HYSTERECTOMY    . BARIATRIC SURGERY    . CARDIAC CATHETERIZATION N/A 12/24/2015   Procedure: Right/Left Heart Cath and Coronary Angiography;  Surgeon: Jettie Booze, MD;  Location: [place] [facility];  Service: Cardiovascular;  Laterality: N/A;  . HERNIA REPAIR    . LEFT HEART  CATH AND CORONARY ANGIOGRAPHY N/A 07/12/2017   Procedure: LEFT HEART CATH AND CORONARY ANGIOGRAPHY;  Surgeon: Belva Crome, MD;  Location: [place] [facility];  Service: Cardiovascular;  Laterality: N/A;     Allergies  Allergen Reactions  . Peanut-Containing Drug Products Rash  . Penicillins Rash    Has patient had a PCN reaction causing immediate rash, facial/tongue/throat swelling, SOB or lightheadedness with hypotension: Yes Has patient had a PCN reaction causing severe rash involving mucus membranes or skin necrosis: No Has patient had a PCN reaction that required hospitalization No Has patient had a PCN reaction occurring within the last 10 years: No. Has not had in 40-50 years If all of the above answers are "NO", then may proceed with Cephalosporin use.      Family History  Problem Relation Age of Onset  . Thyroid cancer Father   . Diabetes Father   . Colon cancer Mother   . Diabetes Mother   . Colon cancer Maternal Grandmother   . Stroke Maternal Grandfather   . Stomach cancer Paternal Grandfather   . Colon cancer Son      Social History Jessica Strong reports that she has never smoked. She has never used smokeless tobacco. Jessica Strong reports no history of alcohol use.   Review of Systems CONSTITUTIONAL: No weight loss, fever, chills, weakness or fatigue.  HEENT: Eyes: No visual loss, blurred vision, double vision or yellow sclerae.No hearing loss, sneezing, congestion, runny nose or sore throat.  SKIN: No rash or itching.  CARDIOVASCULAR: per hpi RESPIRATORY: per hpi GASTROINTESTINAL: No anorexia, nausea, vomiting or diarrhea. No abdominal pain or blood.  GENITOURINARY: No burning on urination, no polyuria NEUROLOGICAL: No headache, dizziness, syncope, paralysis, ataxia, numbness or tingling in the extremities. No change in bowel or bladder control.  MUSCULOSKELETAL: No muscle, back pain, joint pain or stiffness.  LYMPHATICS: No enlarged nodes. No history of  splenectomy.  PSYCHIATRIC: No history of depression or anxiety.  ENDOCRINOLOGIC: No reports of sweating, cold or heat intolerance. No polyuria or polydipsia.  [name]   Physical Examination Today's Vitals   03/07/19 1350  BP: 121/81  Pulse: 72  SpO2: 98%  Weight: 232 lb (105.2 kg)  Height: 5' (1.524 m)   Body mass index is 45.31 kg/m.  Gen: resting comfortably, no acute distress HEENT: no scleral icterus, pupils equal round and reactive, no palptable cervical adenopathy,  CV: RRR, no m/r/g, no jvd Resp: Clear to auscultation bilaterally GI: abdomen is soft, non-tender, non-distended, normal bowel sounds, no hepatosplenomegaly MSK: extremities are warm, no edema.  Skin: warm, no rash Neuro:  no focal deficits Psych: appropriate affect   Diagnostic Studies  10/2015 echo Study Conclusions  -  Left ventricle: The cavity size was normal. Systolic function was normal. The estimated ejection fraction was in the range of 60% to 65%. Mild concentric and moderate focal basal septal hypertrophy. Wall motion was normal; there were no regional wall motion abnormalities. Doppler parameters are consistent with abnormal left ventricular relaxation (grade 1 diastolic dysfunction). - Aortic valve: Trileaflet; mildly thickened leaflets.  11/2015 Cath  LV end diastolic pressure is normal.  Normal right heart pressures.  Low pulmonary artery saturation, 52%. CO 3.9 L/min; Cardiac index 2.0  No angiographically apparent coronary artery disease.  Tortuous right innominate by selective angiogram.  Normal LVEF by echo. Unclear etiology of low PA saturation. No ventriculogram due to renal insufficiency. Continue medical therapy, treatment of OSA and weight loss.  06/2017 nuclear stress  There was no ST segment deviation noted during stress.  The left ventricular ejection fraction is hyperdynamic (>65%).  Findings consistent with mild to moderate anterior ischemia.  Low  to intermediate risk stress test.  06/2017 cath  Normal coronary arteries.  Normal left ventricular size and function with normal hemodynamics.  False positive nuclear study  RECOMMENDATIONS:   No further cardiac ischemic evaluation. Nuclear study is felt to be false positive.    Assessment and Plan  1. Chest pain/SOB -extensive history of symptoms. Extensive cardiac testing in 2017 and 2019 have been benign, including caths with normal arteries and filling pressures. Prior RHC without any significant pulm HTN - prior PFTs were benign, no major findings on CXR -I think symptoms are due to deconditioning and severe obesity, BMI of 45. I have encouraged a regular exercise program at home. Would not repeat cardiac testing in the absence of a significant change in symptoms - would defer to pcp if any further pulmonary testing is indicated, she did have PFTs in 2017 that were benign.   EKG SR, without ischemic changes  F/u as needed      [name], M.D.

## 2019-03-07 NOTE — Patient Instructions (Signed)
Your physician recommends that you schedule a follow-up appointment in: AS NEEDED WITH DR BRANCH  Your physician recommends that you continue on your current medications as directed. Please refer to the Current Medication list given to you today.  Thank you for choosing [facility]!!    

## 2019-03-20 DIAGNOSIS — H35363 Drusen (degenerative) of macula, bilateral: Secondary | ICD-10-CM | POA: Diagnosis not present

## 2019-03-20 DIAGNOSIS — E113311 Type 2 diabetes mellitus with moderate nonproliferative diabetic retinopathy with macular edema, right eye: Secondary | ICD-10-CM | POA: Diagnosis not present

## 2019-04-18 IMAGING — NM NM MYOCAR MULTI W/SPECT W/WALL MOTION & EF
2 series · 12 of 12 positions shown · non-contrast
Comparison: none

[Series 1: rest · 6.51mm/px · 6 of 64 frames shown]
[frame 6/64]
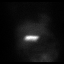
[frame 16/64]
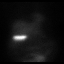
[frame 27/64]
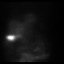
[frame 38/64]
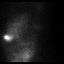
[frame 48/64]
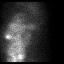
[frame 59/64]
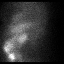

[Series 3: stress gated - perfusion · 6.51mm/px · 6 of 64 frames shown]
[frame 6/64]
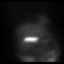
[frame 16/64]
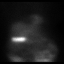
[frame 27/64]
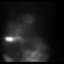
[frame 38/64]
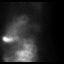
[frame 48/64]
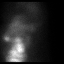
[frame 59/64]
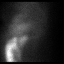

[12 of 12 positions shown; findings below may reference images not displayed]

Canned report from images found in remote index.

Refer to host system for actual result text.

## 2020-05-20 ENCOUNTER — Encounter (INDEPENDENT_AMBULATORY_CARE_PROVIDER_SITE_OTHER): Payer: Self-pay | Admitting: *Deleted

## 2020-06-11 ENCOUNTER — Encounter: Payer: Self-pay | Admitting: Gastroenterology

## 2020-07-29 ENCOUNTER — Other Ambulatory Visit: Payer: Self-pay | Admitting: Internal Medicine

## 2020-07-29 DIAGNOSIS — Z1231 Encounter for screening mammogram for malignant neoplasm of breast: Secondary | ICD-10-CM

## 2020-08-02 ENCOUNTER — Other Ambulatory Visit: Payer: Self-pay

## 2020-08-02 ENCOUNTER — Ambulatory Visit
Admission: RE | Admit: 2020-08-02 | Discharge: 2020-08-02 | Disposition: A | Discharge status: Home / Self Care | Payer: Medicare Other | Source: Ambulatory Visit | Attending: Internal Medicine | Admitting: Internal Medicine

## 2020-08-02 DIAGNOSIS — Z1231 Encounter for screening mammogram for malignant neoplasm of breast: Secondary | ICD-10-CM

## 2020-08-19 ENCOUNTER — Ambulatory Visit (AMBULATORY_SURGERY_CENTER): Payer: Medicare Other

## 2020-08-19 ENCOUNTER — Other Ambulatory Visit: Payer: Self-pay

## 2020-08-19 VITALS — Ht 60.0 in | Wt 230.0 lb

## 2020-08-19 DIAGNOSIS — Z86010 Personal history of colonic polyps: Secondary | ICD-10-CM

## 2020-08-19 MED ORDER — NA SULFATE-K SULFATE-MG SULF 17.5-3.13-1.6 GM/177ML PO SOLN: 1.0000 | Freq: Once | ORAL | 0 refills | Status: AC

## 2020-08-19 NOTE — Progress Notes (Signed)
No allergies to soy or egg  Pt is not on blood thinners (stopped taking Plavix several years ago) or diet pills  Denies issues with sedation/intubation Denies atrial flutter/fib Denies constipation   Pt is aware of Covid safety and care partner requirements.   Pt verified name, DOB, address and insurance during PV today.   Pt mailed instruction packet to include copy of consent form to read and not return, and instructions. PV completed over the phone. Pt encouraged to call with questions or issues.   Medication for DM changed quite a bit updated to what pt confirmed she was taking which include glimepiride, meformin and humalog injections.  Pt instructed to stop taking iron 5 days prior to colonoscopy  Pt has also stopped taking meds to improve memory-seemed to follow conversation and instructions well, if not a little slower  Notes she is seeing a doctor she has waited months to see for what may be a hernia wanted to know if the appt would be an issue.  [place] asked patient to let the provider know she is having a colonoscopy on 5/5 and to call if there are any issues.

## 2020-08-26 ENCOUNTER — Encounter (INDEPENDENT_AMBULATORY_CARE_PROVIDER_SITE_OTHER): Payer: Self-pay

## 2020-08-26 ENCOUNTER — Telehealth (INDEPENDENT_AMBULATORY_CARE_PROVIDER_SITE_OTHER): Payer: Self-pay

## 2020-08-26 ENCOUNTER — Other Ambulatory Visit (INDEPENDENT_AMBULATORY_CARE_PROVIDER_SITE_OTHER): Payer: Self-pay

## 2020-08-26 ENCOUNTER — Other Ambulatory Visit: Payer: Self-pay

## 2020-08-26 ENCOUNTER — Ambulatory Visit (INDEPENDENT_AMBULATORY_CARE_PROVIDER_SITE_OTHER): Payer: Medicare Other | Admitting: Gastroenterology

## 2020-08-26 ENCOUNTER — Encounter (INDEPENDENT_AMBULATORY_CARE_PROVIDER_SITE_OTHER): Payer: Self-pay | Admitting: Gastroenterology

## 2020-08-26 VITALS — BP 151/72 | HR 73 | Temp 98.1°F | Ht 60.0 in | Wt 234.0 lb

## 2020-08-26 DIAGNOSIS — Z9884 Bariatric surgery status: Secondary | ICD-10-CM

## 2020-08-26 DIAGNOSIS — R1011 Right upper quadrant pain: Secondary | ICD-10-CM | POA: Diagnosis not present

## 2020-08-26 DIAGNOSIS — Z86010 Personal history of colonic polyps: Secondary | ICD-10-CM

## 2020-08-26 DIAGNOSIS — R0602 Shortness of breath: Secondary | ICD-10-CM | POA: Diagnosis not present

## 2020-08-26 MED ORDER — DICYCLOMINE HCL 10 MG PO CAPS: 10.0000 mg | ORAL_CAPSULE | Freq: Three times a day (TID) | ORAL | 2 refills | Status: DC | PRN

## 2020-08-26 NOTE — H&P (View-Only) (Signed)
Jessica Strong, M.D. Gastroenterology & Hepatology [facility] [address] [place], [place] [address] Primary Care Physician: Glenda Chroman, MD [address] [place] [address]  Referring MD: PCP  Chief Complaint: Abdominal pain  History of Present Illness: Jessica Strong is a 75 y.o. female with past medical history of obesity status post Roux-en-Y gastric bypass, anxiety, depression, diabetes, hyperlipidemia, hypertension, myocardial infarction, stroke, who presents for evaluation of abdominal pain.  Patient reports that for the last 3 years she has been presenting abdominal pain in the RUQ which is intermittent in nature.  Patient states that she developed the abdominal pain after undergoing her most recent colonoscopy.  States that the pain does not radiate. She does not know how to describe the pain. She denies having any pain when sleeping. She also has presented some nausea and vomiting every 2-3 months.   Patient reported that she has felt concerned as she reports some shortness of breath with exertion, but also even while sitting.  She has not needed any oxygen supplementation.  The patient has been evaluated by cardiology in the past, had a normal heart cath in 2019.  Had a TTE at that time that showed an ejection fraction of 60 to 65%, no significant valvulopathies.  Also had PFTs in 2017 which were normal, but has not seen pulmonologist in the past.  The patient denies having any fever, chills, hematochezia, melena, hematemesis, abdominal distention, diarrhea, jaundice, pruritus. Has gained 4 lb recently.  Notably, the patient has been told she has anemia at least for last 50 years, never required blood transfusion.Her most recent blood testing was performed at [facility] on 02/16/2020, she had a CBC with hemoglobin of 9.7, MCV was 94, also count was 10.6, platelets 250, CMP showed normal BUN of 19, creatinine was 1.64,  bilirubin 0.2, ALT 19, AST 17, alkaline phosphatase 100, normal electrolytes.  As part of the evaluation of her abdominal pain, the patient underwent an abdominal ultrasound at [facility] on 04/20/2020 which showed probable fatty infiltration of liver, pancreas and distal aorta were not visualized completely.  Rest of exam was within normal limits.  No imaging is available but only the report of the imaging.  FHx: neg for any gastrointestinal/liver disease, colon cancer son at 29 years, mother in her 35s, aunt and grandmother, father thyroid cancer Social: neg smoking, alcohol or illicit drug use Surgical: ventral hernia repair, RYGB  Never EGD. 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  Advised to repeat in 3 years.  Past Medical History: Past Medical History:  Diagnosis Date  . Allergy   . Anemia   . Anxiety   . Arthritis   . Cataract   . cervical ca 1990  . Depression   . Diabetes ([place])   . Hypercholesterolemia   . Hypertension   . Myocardial infarction ([place])   . Sleep apnea    on CPAP 14 mm PS since February 2017  . Stroke ([place])   . Vitamin D deficiency     Past Surgical History: Past Surgical History:  Procedure Laterality Date  . ABDOMINAL HYSTERECTOMY    . BARIATRIC SURGERY    . CARDIAC CATHETERIZATION N/A 12/24/2015   Procedure: Right/Left Heart Cath and Coronary Angiography;  Surgeon: Conception Oms  Hassell Done, MD;  Location: [place] [facility];  Service: Cardiovascular;  Laterality: N/A;  . COLONOSCOPY  2018  . HERNIA REPAIR    . LEFT HEART CATH AND CORONARY ANGIOGRAPHY N/A 07/12/2017   Procedure: LEFT HEART CATH AND CORONARY ANGIOGRAPHY;  Surgeon: Belva Crome, MD;  Location: [place] [facility];  Service: Cardiovascular;  Laterality: N/A;     Family History: Family History  Problem Relation Age of Onset  . Thyroid cancer Father   . Diabetes Father   . Esophageal cancer Father   . Colon cancer Mother   . Diabetes Mother   . Colon cancer Maternal Grandmother   . Stroke Maternal Grandfather   . Stomach cancer Paternal Grandfather   . Colon cancer Son   . Breast cancer Maternal Aunt   . Colon cancer Maternal Aunt   . Breast cancer Maternal Aunt   . Colon polyps Neg Hx   . Rectal cancer Neg Hx     Social History: Social History   Tobacco Use  Smoking Status Never Smoker  Smokeless Tobacco Never Used   Social History   Substance and Sexual Activity  Alcohol Use No  . Alcohol/week: 0.0 standard drinks   Comment: a drink about every six months   Social History   Substance and Sexual Activity  Drug Use No    Allergies: Allergies  Allergen Reactions  . Corn Oil Rash  . Other Rash    PT STATES SHE IS ALLERGIC TO MOST FOODS-STATES IT WAS FOUND IN ALLERGY TESTING  . Peanut Allergen Powder-Dnfp Rash  . Peanut-Containing Drug Products Rash  . Penicillins Rash    Has patient had a PCN reaction causing immediate rash, facial/tongue/throat swelling, SOB or lightheadedness with hypotension: Yes Has patient had a PCN reaction causing severe rash involving mucus membranes or skin necrosis: No Has patient had a PCN reaction that required hospitalization No Has patient had a PCN reaction occurring within the last 10 years: No. Has not had in 40-50 years If all of the above answers are "NO", then may proceed with Cephalosporin use.    Medications: Current Outpatient Medications  Medication Sig Dispense Refill  . ACCU-CHEK AVIVA PLUS test strip Inject 1 strip as directed daily.  4  . acetaminophen (TYLENOL) 500 MG tablet Take by mouth. As needed.    [name] aspirin 81 MG EC tablet Take by mouth.    . Calcium Carb-Cholecalciferol (CALCIUM 600-D PO) Take 1 tablet by mouth daily.    [name] EPINEPHrine 0.3 mg/0.3 mL IJ SOAJ  injection Inject 0.3 mg into the muscle as needed (allergic reaction).     . ferrous sulfate 325 (65 FE) MG tablet Take 325 mg by mouth daily with breakfast.    . glimepiride (AMARYL) 4 MG tablet Take 4 mg by mouth 2 (two) times daily.    . insulin lispro (HUMALOG) 100 UNIT/ML injection Inject into the skin 3 (three) times daily before meals.    . Lancets Thin MISC Inject 1 Stick into the skin daily.    [name] levothyroxine (SYNTHROID) 25 MCG tablet Take 25 mcg by mouth daily.    [name] lisinopril-hydrochlorothiazide (PRINZIDE,ZESTORETIC) 20-12.5 MG tablet Take 1 tablet by mouth daily.     . memantine (NAMENDA XR) 14 MG CP24 24 hr capsule Take 14 mg by mouth every evening.    . metFORMIN (GLUCOPHAGE) 1000 MG tablet Take 1 tablet (1,000 mg total) by mouth 2 (two) times daily.    . Multiple Vitamin (QUINTABS) TABS Take  by mouth.    . Multiple Vitamins-Minerals (MULTIVITAMIN ADULT) CHEW Chew 2 tablets by mouth daily at 12 noon.    . Potassium 95 MG TABS Take 95 mg by mouth every evening.     . simvastatin (ZOCOR) 20 MG tablet Take 20 mg by mouth every evening.     . clopidogrel (PLAVIX) 75 MG tablet Take 75 mg by mouth every evening.  (Patient not taking: No sig reported)    . donepezil (ARICEPT) 10 MG tablet Take 10 mg by mouth at bedtime. (Patient not taking: No sig reported)    . Dulaglutide 0.75 MG/0.5ML SOPN Inject 0.5 mLs into the skin every Friday.  (Patient not taking: No sig reported)    . Empagliflozin-linaGLIPtin 25-5 MG TABS Take 1 tablet by mouth every evening. (Patient not taking: No sig reported)    . ergocalciferol (VITAMIN D2) 50000 units capsule Take 50,000 Units by mouth once a week. (Patient not taking: Reported on 08/26/2020)    . MYRBETRIQ 25 MG TB24 tablet Take 25 mg by mouth every evening.  (Patient not taking: No sig reported)  4   No current facility-administered medications for this visit.    Review of Systems: GENERAL: negative for malaise, night sweats HEENT: No changes in  hearing or vision, no nose bleeds or other nasal problems. NECK: Negative for lumps, goiter, pain and significant neck swelling RESPIRATORY: Negative for cough, wheezing CARDIOVASCULAR: Negative for chest pain, leg swelling, palpitations, orthopnea GI: SEE HPI MUSCULOSKELETAL: Negative for joint pain or swelling, back pain, and muscle pain. SKIN: Negative for lesions, rash PSYCH: Negative for sleep disturbance, mood disorder and recent psychosocial stressors. HEMATOLOGY Negative for prolonged bleeding, bruising easily, and swollen nodes. ENDOCRINE: Negative for cold or heat intolerance, polyuria, polydipsia and goiter. NEURO: negative for tremor, gait imbalance, syncope and seizures. The remainder of the review of systems is noncontributory.   Physical Exam: BP (!) 151/72 (BP Location: Left Arm, Patient Position: Sitting, Cuff Size: Large)   Pulse 73   Temp 98.1 F (36.7 C) (Oral)   Ht 5' (1.524 m)   Wt 234 lb (106.1 kg)   BMI 45.70 kg/m  GENERAL: The patient is AO x3, in no acute distress.  Obese. HEENT: Head is normocephalic and atraumatic. EOMI are intact. Mouth is well hydrated and without lesions. NECK: Supple. No masses LUNGS: Clear to auscultation. No presence of rhonchi/wheezing/rales. Adequate chest expansion HEART: RRR, normal s1 and s2. ABDOMEN: Tender to palpation in the right upper quadrant, no guarding, no peritoneal signs, and nondistended. BS +. No masses. EXTREMITIES: Without any cyanosis, clubbing, rash, lesions or edema. NEUROLOGIC: AOx3, no focal motor deficit. SKIN: no jaundice, no rashes   Imaging/Labs: as above  I personally reviewed and interpreted the available labs, imaging and endoscopic files.  Impression and Plan: Jessica Strong is a 75 y.o. female with past medical history of obesity status post Roux-en-Y gastric bypass, anxiety, depression, diabetes, hyperlipidemia, hypertension, myocardial infarction, stroke, who presents for evaluation of  abdominal pain.  Patient has presented chronic of unclear etiology.  She had an ultrasound that did not show any alteration although evaluation of the pancreas was inconclusive.  Her recent blood work-up was unremarkable for any major hepatobiliary alterations explain her chronic abdominal pain.  It is possible that this pain is related to a functional etiology, but will need to evaluate further her upper gastrointestinal tract with an EGD.  I explained to her that it may not be possible to evaluate her gastric remnant  due to her anatomy.  Given her age and postsurgical anatomy, it will be important to investigate her pain further with a CT of the abdomen and pelvis with IV contrast.  The patient is scheduled to undergo colonoscopy next week but she would rather have the EGD and colonoscopy at the same time.  She will perform both at [facility].For now, will prescribe Bentyl to improve her abdominal pain.  Patient understood and agreed.  Finally, the patient has endorsed chronic dyspnea even during rest.  She had an extensive investigation by cardiology that was unremarkable.  I will refer her to pulmonology for further evaluation of this symptom.  - Schedule EGD and colonoscopy - Start Bentyl 1 tablet q8h as needed for abdominal pain - Schedule CT abdomen/pelvis with IV contrast - Referral to pulmonology  All questions were answered.      Jessica Peppers, MD Gastroenterology and Hepatology [facility]

## 2020-08-26 NOTE — Progress Notes (Signed)
Jessica Strong, M.D. Gastroenterology & Hepatology [facility] [address] [place], [place] [address] Primary Care Physician: Glenda Chroman, MD [address] [place] [address]  Referring MD: PCP  Chief Complaint: Abdominal pain  History of Present Illness: Jessica Strong is a 75 y.o. female with past medical history of obesity status post Roux-en-Y gastric bypass, anxiety, depression, diabetes, hyperlipidemia, hypertension, myocardial infarction, stroke, who presents for evaluation of abdominal pain.  Patient reports that for the last 3 years she has been presenting abdominal pain in the RUQ which is intermittent in nature.  Patient states that she developed the abdominal pain after undergoing her most recent colonoscopy.  States that the pain does not radiate. She does not know how to describe the pain. She denies having any pain when sleeping. She also has presented some nausea and vomiting every 2-3 months.   Patient reported that she has felt concerned as she reports some shortness of breath with exertion, but also even while sitting.  She has not needed any oxygen supplementation.  The patient has been evaluated by cardiology in the past, had a normal heart cath in 2019.  Had a TTE at that time that showed an ejection fraction of 60 to 65%, no significant valvulopathies.  Also had PFTs in 2017 which were normal, but has not seen pulmonologist in the past.  The patient denies having any fever, chills, hematochezia, melena, hematemesis, abdominal distention, diarrhea, jaundice, pruritus. Has gained 4 lb recently.  Notably, the patient has been told she has anemia at least for last 50 years, never required blood transfusion.Her most recent blood testing was performed at [facility] on 02/16/2020, she had a CBC with hemoglobin of 9.7, MCV was 94, also count was 10.6, platelets 250, CMP showed normal BUN of 19, creatinine was 1.64,  bilirubin 0.2, ALT 19, AST 17, alkaline phosphatase 100, normal electrolytes.  As part of the evaluation of her abdominal pain, the patient underwent an abdominal ultrasound at [facility] on 04/20/2020 which showed probable fatty infiltration of liver, pancreas and distal aorta were not visualized completely.  Rest of exam was within normal limits.  No imaging is available but only the report of the imaging.  FHx: neg for any gastrointestinal/liver disease, colon cancer son at 29 years, mother in her 35s, aunt and grandmother, father thyroid cancer Social: neg smoking, alcohol or illicit drug use Surgical: ventral hernia repair, RYGB  Never EGD. 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  Advised to repeat in 3 years.  Past Medical History: Past Medical History:  Diagnosis Date  . Allergy   . Anemia   . Anxiety   . Arthritis   . Cataract   . cervical ca 1990  . Depression   . Diabetes ([place])   . Hypercholesterolemia   . Hypertension   . Myocardial infarction ([place])   . Sleep apnea    on CPAP 14 mm PS since February 2017  . Stroke ([place])   . Vitamin D deficiency     Past Surgical History: Past Surgical History:  Procedure Laterality Date  . ABDOMINAL HYSTERECTOMY    . BARIATRIC SURGERY    . CARDIAC CATHETERIZATION N/A 12/24/2015   Procedure: Right/Left Heart Cath and Coronary Angiography;  Surgeon: Conception Oms  Hassell Done, MD;  Location: [place] [facility];  Service: Cardiovascular;  Laterality: N/A;  . COLONOSCOPY  2018  . HERNIA REPAIR    . LEFT HEART CATH AND CORONARY ANGIOGRAPHY N/A 07/12/2017   Procedure: LEFT HEART CATH AND CORONARY ANGIOGRAPHY;  Surgeon: Belva Crome, MD;  Location: [place] [facility];  Service: Cardiovascular;  Laterality: N/A;     Family History: Family History  Problem Relation Age of Onset  . Thyroid cancer Father   . Diabetes Father   . Esophageal cancer Father   . Colon cancer Mother   . Diabetes Mother   . Colon cancer Maternal Grandmother   . Stroke Maternal Grandfather   . Stomach cancer Paternal Grandfather   . Colon cancer Son   . Breast cancer Maternal Aunt   . Colon cancer Maternal Aunt   . Breast cancer Maternal Aunt   . Colon polyps Neg Hx   . Rectal cancer Neg Hx     Social History: Social History   Tobacco Use  Smoking Status Never Smoker  Smokeless Tobacco Never Used   Social History   Substance and Sexual Activity  Alcohol Use No  . Alcohol/week: 0.0 standard drinks   Comment: a drink about every six months   Social History   Substance and Sexual Activity  Drug Use No    Allergies: Allergies  Allergen Reactions  . Corn Oil Rash  . Other Rash    PT STATES SHE IS ALLERGIC TO MOST FOODS-STATES IT WAS FOUND IN ALLERGY TESTING  . Peanut Allergen Powder-Dnfp Rash  . Peanut-Containing Drug Products Rash  . Penicillins Rash    Has patient had a PCN reaction causing immediate rash, facial/tongue/throat swelling, SOB or lightheadedness with hypotension: Yes Has patient had a PCN reaction causing severe rash involving mucus membranes or skin necrosis: No Has patient had a PCN reaction that required hospitalization No Has patient had a PCN reaction occurring within the last 10 years: No. Has not had in 40-50 years If all of the above answers are "NO", then may proceed with Cephalosporin use.    Medications: Current Outpatient Medications  Medication Sig Dispense Refill  . ACCU-CHEK AVIVA PLUS test strip Inject 1 strip as directed daily.  4  . acetaminophen (TYLENOL) 500 MG tablet Take by mouth. As needed.    [name] aspirin 81 MG EC tablet Take by mouth.    . Calcium Carb-Cholecalciferol (CALCIUM 600-D PO) Take 1 tablet by mouth daily.    [name] EPINEPHrine 0.3 mg/0.3 mL IJ SOAJ  injection Inject 0.3 mg into the muscle as needed (allergic reaction).     . ferrous sulfate 325 (65 FE) MG tablet Take 325 mg by mouth daily with breakfast.    . glimepiride (AMARYL) 4 MG tablet Take 4 mg by mouth 2 (two) times daily.    . insulin lispro (HUMALOG) 100 UNIT/ML injection Inject into the skin 3 (three) times daily before meals.    . Lancets Thin MISC Inject 1 Stick into the skin daily.    [name] levothyroxine (SYNTHROID) 25 MCG tablet Take 25 mcg by mouth daily.    [name] lisinopril-hydrochlorothiazide (PRINZIDE,ZESTORETIC) 20-12.5 MG tablet Take 1 tablet by mouth daily.     . memantine (NAMENDA XR) 14 MG CP24 24 hr capsule Take 14 mg by mouth every evening.    . metFORMIN (GLUCOPHAGE) 1000 MG tablet Take 1 tablet (1,000 mg total) by mouth 2 (two) times daily.    . Multiple Vitamin (QUINTABS) TABS Take  by mouth.    . Multiple Vitamins-Minerals (MULTIVITAMIN ADULT) CHEW Chew 2 tablets by mouth daily at 12 noon.    . Potassium 95 MG TABS Take 95 mg by mouth every evening.     . simvastatin (ZOCOR) 20 MG tablet Take 20 mg by mouth every evening.     . clopidogrel (PLAVIX) 75 MG tablet Take 75 mg by mouth every evening.  (Patient not taking: No sig reported)    . donepezil (ARICEPT) 10 MG tablet Take 10 mg by mouth at bedtime. (Patient not taking: No sig reported)    . Dulaglutide 0.75 MG/0.5ML SOPN Inject 0.5 mLs into the skin every Friday.  (Patient not taking: No sig reported)    . Empagliflozin-linaGLIPtin 25-5 MG TABS Take 1 tablet by mouth every evening. (Patient not taking: No sig reported)    . ergocalciferol (VITAMIN D2) 50000 units capsule Take 50,000 Units by mouth once a week. (Patient not taking: Reported on 08/26/2020)    . MYRBETRIQ 25 MG TB24 tablet Take 25 mg by mouth every evening.  (Patient not taking: No sig reported)  4   No current facility-administered medications for this visit.    Review of Systems: GENERAL: negative for malaise, night sweats HEENT: No changes in  hearing or vision, no nose bleeds or other nasal problems. NECK: Negative for lumps, goiter, pain and significant neck swelling RESPIRATORY: Negative for cough, wheezing CARDIOVASCULAR: Negative for chest pain, leg swelling, palpitations, orthopnea GI: SEE HPI MUSCULOSKELETAL: Negative for joint pain or swelling, back pain, and muscle pain. SKIN: Negative for lesions, rash PSYCH: Negative for sleep disturbance, mood disorder and recent psychosocial stressors. HEMATOLOGY Negative for prolonged bleeding, bruising easily, and swollen nodes. ENDOCRINE: Negative for cold or heat intolerance, polyuria, polydipsia and goiter. NEURO: negative for tremor, gait imbalance, syncope and seizures. The remainder of the review of systems is noncontributory.   Physical Exam: BP (!) 151/72 (BP Location: Left Arm, Patient Position: Sitting, Cuff Size: Large)   Pulse 73   Temp 98.1 F (36.7 C) (Oral)   Ht 5' (1.524 m)   Wt 234 lb (106.1 kg)   BMI 45.70 kg/m  GENERAL: The patient is AO x3, in no acute distress.  Obese. HEENT: Head is normocephalic and atraumatic. EOMI are intact. Mouth is well hydrated and without lesions. NECK: Supple. No masses LUNGS: Clear to auscultation. No presence of rhonchi/wheezing/rales. Adequate chest expansion HEART: RRR, normal s1 and s2. ABDOMEN: Tender to palpation in the right upper quadrant, no guarding, no peritoneal signs, and nondistended. BS +. No masses. EXTREMITIES: Without any cyanosis, clubbing, rash, lesions or edema. NEUROLOGIC: AOx3, no focal motor deficit. SKIN: no jaundice, no rashes   Imaging/Labs: as above  I personally reviewed and interpreted the available labs, imaging and endoscopic files.  Impression and Plan: Jessica Strong is a 75 y.o. female with past medical history of obesity status post Roux-en-Y gastric bypass, anxiety, depression, diabetes, hyperlipidemia, hypertension, myocardial infarction, stroke, who presents for evaluation of  abdominal pain.  Patient has presented chronic of unclear etiology.  She had an ultrasound that did not show any alteration although evaluation of the pancreas was inconclusive.  Her recent blood work-up was unremarkable for any major hepatobiliary alterations explain her chronic abdominal pain.  It is possible that this pain is related to a functional etiology, but will need to evaluate further her upper gastrointestinal tract with an EGD.  I explained to her that it may not be possible to evaluate her gastric remnant  due to her anatomy.  Given her age and postsurgical anatomy, it will be important to investigate her pain further with a CT of the abdomen and pelvis with IV contrast.  The patient is scheduled to undergo colonoscopy next week but she would rather have the EGD and colonoscopy at the same time.  She will perform both at [facility].For now, will prescribe Bentyl to improve her abdominal pain.  Patient understood and agreed.  Finally, the patient has endorsed chronic dyspnea even during rest.  She had an extensive investigation by cardiology that was unremarkable.  I will refer her to pulmonology for further evaluation of this symptom.  - Schedule EGD and colonoscopy - Start Bentyl 1 tablet q8h as needed for abdominal pain - Schedule CT abdomen/pelvis with IV contrast - Referral to pulmonology  All questions were answered.      Jessica Peppers, MD Gastroenterology and Hepatology [facility]

## 2020-08-26 NOTE — Telephone Encounter (Signed)
Jessica Strong, CMA  

## 2020-08-26 NOTE — Patient Instructions (Addendum)
Schedule EGD and colonoscopy Start Bentyl 1 tablet q8h as needed for abdominal pain Schedule CT abdomen/pelvis with IV contrast Referral to pulmonology

## 2020-09-02 ENCOUNTER — Encounter: Payer: Medicare Other | Admitting: Gastroenterology

## 2020-09-14 NOTE — Patient Instructions (Signed)
[place]  09/14/2020     @PREFPERIOPPHARMACY @   Your procedure is scheduled on  09/17/2020.   Report to [facility] at  0830  A.M.   Call this number if you have problems the morning of surgery:  [phone]   Remember:  Follow the diet and prep instructions given to you by the office,                     Take these medicines the morning of surgery with A SIP OF WATER  Levothyroxine.  DO NOT take any medications for diabetes the morning of your procedure.  If your glucose is 70 or below the morning of your procedure, drink 1/2 cup of clear liquid containing sugar and recheck your glucose in 15 minutes. If your glucose is still 70 or below, call [phone] for instructions.  If your glucose is 300 or above the morning of your procedure, call [phone] for instructions.     Please brush your teeth.  Do not wear jewelry, make-up or nail polish.  Do not wear lotions, powders, or perfumes, or deodorant.  Do not shave 48 hours prior to surgery.  Men may shave face and neck.  Do not bring valuables to the hospital.  [facility] is not responsible for any belongings or valuables.  Contacts, dentures or bridgework may not be worn into surgery.  Leave your suitcase in the car.  After surgery it may be brought to your room.  For patients admitted to the hospital, discharge time will be determined by your treatment team.  Patients discharged the day of surgery will not be allowed to drive home and must have someone with them for 24 hours.     Special instructions:  DO NOT smoke tobacco or vape for 24 hours before your procedure.  Please read over the following fact sheets that you were given. Anesthesia Post-op Instructions and Care and Recovery After Surgery       Upper Endoscopy, Adult, Care After This sheet gives you information about how to care for yourself after your procedure. Your health care provider may also give you more specific instructions. If  you have problems or questions, contact your health care provider. What can I expect after the procedure? After the procedure, it is common to have:  A sore throat.  Mild stomach pain or discomfort.  Bloating.  Nausea. Follow these instructions at home:  Follow instructions from your health care provider about what to eat or drink after your procedure.  Return to your normal activities as told by your health care provider. Ask your health care provider what activities are safe for you.  Take over-the-counter and prescription medicines only as told by your health care provider.  If you were given a sedative during the procedure, it can affect you for several hours. Do not drive or operate machinery until your health care provider says that it is safe.  Keep all follow-up visits as told by your health care provider. This is important.   Contact a health care provider if you have:  A sore throat that lasts longer than one day.  Trouble swallowing. Get help right away if:  You vomit blood or your vomit looks like coffee grounds.  You have: ? A fever. ? Bloody, black, or tarry stools. ? A severe sore throat or you cannot swallow. ? Difficulty breathing. ? Severe pain in your chest or abdomen. Summary  After the  procedure, it is common to have a sore throat, mild stomach discomfort, bloating, and nausea.  If you were given a sedative during the procedure, it can affect you for several hours. Do not drive or operate machinery until your health care provider says that it is safe.  Follow instructions from your health care provider about what to eat or drink after your procedure.  Return to your normal activities as told by your health care provider. This information is not intended to replace advice given to you by your health care provider. Make sure you discuss any questions you have with your health care provider. Document Revised: 04/15/2019 Document Reviewed:  09/17/2017 Elsevier Patient Education  2021 [place].  Colonoscopy, Adult, Care After This sheet gives you information about how to care for yourself after your procedure. Your health care provider may also give you more specific instructions. If you have problems or questions, contact your health care provider. What can I expect after the procedure? After the procedure, it is common to have:  A small amount of blood in your stool for 24 hours after the procedure.  Some gas.  Mild cramping or bloating of your abdomen. Follow these instructions at home: Eating and drinking  Drink enough fluid to keep your urine pale yellow.  Follow instructions from your health care provider about eating or drinking restrictions.  Resume your normal diet as instructed by your health care provider. Avoid heavy or fried foods that are hard to digest.   Activity  Rest as told by your health care provider.  Avoid sitting for a long time without moving. Get up to take short walks every 1-2 hours. This is important to improve blood flow and breathing. Ask for help if you feel weak or unsteady.  Return to your normal activities as told by your health care provider. Ask your health care provider what activities are safe for you. Managing cramping and bloating  Try walking around when you have cramps or feel bloated.  Apply heat to your abdomen as told by your health care provider. Use the heat source that your health care provider recommends, such as a moist heat pack or a heating pad. ? Place a towel between your skin and the heat source. ? Leave the heat on for 20-30 minutes. ? Remove the heat if your skin turns bright red. This is especially important if you are unable to feel pain, heat, or cold. You may have a greater risk of getting burned.   General instructions  If you were given a sedative during the procedure, it can affect you for several hours. Do not drive or operate machinery until  your health care provider says that it is safe.  For the first 24 hours after the procedure: ? Do not sign important documents. ? Do not drink alcohol. ? Do your regular daily activities at a slower pace than normal. ? Eat soft foods that are easy to digest.  Take over-the-counter and prescription medicines only as told by your health care provider.  Keep all follow-up visits as told by your health care provider. This is important. Contact a health care provider if:  You have blood in your stool 2-3 days after the procedure. Get help right away if you have:  More than a small spotting of blood in your stool.  Large blood clots in your stool.  Swelling of your abdomen.  Nausea or vomiting.  A fever.  Increasing pain in your abdomen that is not  relieved with medicine. Summary  After the procedure, it is common to have a small amount of blood in your stool. You may also have mild cramping and bloating of your abdomen.  If you were given a sedative during the procedure, it can affect you for several hours. Do not drive or operate machinery until your health care provider says that it is safe.  Get help right away if you have a lot of blood in your stool, nausea or vomiting, a fever, or increased pain in your abdomen. This information is not intended to replace advice given to you by your health care provider. Make sure you discuss any questions you have with your health care provider. Document Revised: 04/11/2019 Document Reviewed: 11/11/2018 Elsevier Patient Education  2021 [place] After This sheet gives you information about how to care for yourself after your procedure. Your health care provider may also give you more specific instructions. If you have problems or questions, contact your health care provider. What can I expect after the procedure? After the procedure, it is common to have:  Tiredness.  Forgetfulness about what happened  after the procedure.  Impaired judgment for important decisions.  Nausea or vomiting.  Some difficulty with balance. Follow these instructions at home: For the time period you were told by your health care provider:  Rest as needed.  Do not participate in activities where you could fall or become injured.  Do not drive or use machinery.  Do not drink alcohol.  Do not take sleeping pills or medicines that cause drowsiness.  Do not make important decisions or sign legal documents.  Do not take care of children on your own.      Eating and drinking  Follow the diet that is recommended by your health care provider.  Drink enough fluid to keep your urine pale yellow.  If you vomit: ? Drink water, juice, or soup when you can drink without vomiting. ? Make sure you have little or no nausea before eating solid foods. General instructions  Have a responsible adult stay with you for the time you are told. It is important to have someone help care for you until you are awake and alert.  Take over-the-counter and prescription medicines only as told by your health care provider.  If you have sleep apnea, surgery and certain medicines can increase your risk for breathing problems. Follow instructions from your health care provider about wearing your sleep device: ? Anytime you are sleeping, including during daytime naps. ? While taking prescription pain medicines, sleeping medicines, or medicines that make you drowsy.  Avoid smoking.  Keep all follow-up visits as told by your health care provider. This is important. Contact a health care provider if:  You keep feeling nauseous or you keep vomiting.  You feel light-headed.  You are still sleepy or having trouble with balance after 24 hours.  You develop a rash.  You have a fever.  You have redness or swelling around the IV site. Get help right away if:  You have trouble breathing.  You have new-onset confusion at  home. Summary  For several hours after your procedure, you may feel tired. You may also be forgetful and have poor judgment.  Have a responsible adult stay with you for the time you are told. It is important to have someone help care for you until you are awake and alert.  Rest as told. Do not drive or operate machinery. Do not  drink alcohol or take sleeping pills.  Get help right away if you have trouble breathing, or if you suddenly become confused. This information is not intended to replace advice given to you by your health care provider. Make sure you discuss any questions you have with your health care provider. Document Revised: 01/01/2020 Document Reviewed: 03/20/2019 Elsevier Patient Education  2021 [employer].

## 2020-09-15 ENCOUNTER — Encounter (HOSPITAL_COMMUNITY): Payer: Self-pay

## 2020-09-15 ENCOUNTER — Other Ambulatory Visit (HOSPITAL_COMMUNITY)
Admission: RE | Admit: 2020-09-15 | Discharge: 2020-09-15 | Disposition: A | Discharge status: Home / Self Care | Payer: Medicare Other | Source: Ambulatory Visit | Attending: Gastroenterology | Admitting: Gastroenterology

## 2020-09-15 ENCOUNTER — Encounter (HOSPITAL_COMMUNITY)
Admission: RE | Admit: 2020-09-15 | Discharge: 2020-09-15 | Disposition: A | Discharge status: Home / Self Care | Payer: Medicare Other | Source: Ambulatory Visit | Attending: Gastroenterology | Admitting: Gastroenterology

## 2020-09-15 ENCOUNTER — Other Ambulatory Visit: Payer: Self-pay

## 2020-09-15 DIAGNOSIS — Z01818 Encounter for other preprocedural examination: Secondary | ICD-10-CM | POA: Insufficient documentation

## 2020-09-15 DIAGNOSIS — Z20822 Contact with and (suspected) exposure to covid-19: Secondary | ICD-10-CM | POA: Insufficient documentation

## 2020-09-15 LAB — CBC WITH DIFFERENTIAL/PLATELET
Abs Immature Granulocytes: 0.08 10*3/uL — ABNORMAL HIGH (ref 0.00–0.07)
Basophils Absolute: 0.1 10*3/uL (ref 0.0–0.1)
Basophils Relative: 1 %
Eosinophils Absolute: 0.4 10*3/uL (ref 0.0–0.5)
Eosinophils Relative: 5 %
HCT: 34.6 % — ABNORMAL LOW (ref 36.0–46.0)
Hemoglobin: 10.5 g/dL — ABNORMAL LOW (ref 12.0–15.0)
Immature Granulocytes: 1 %
Lymphocytes Relative: 19 %
Lymphs Abs: 1.6 10*3/uL (ref 0.7–4.0)
MCH: 26.1 pg (ref 26.0–34.0)
MCHC: 30.3 g/dL (ref 30.0–36.0)
MCV: 85.9 fL (ref 80.0–100.0)
Monocytes Absolute: 0.7 10*3/uL (ref 0.1–1.0)
Monocytes Relative: 8 %
Neutro Abs: 5.5 10*3/uL (ref 1.7–7.7)
Neutrophils Relative %: 66 %
Platelets: 273 10*3/uL (ref 150–400)
RBC: 4.03 MIL/uL (ref 3.87–5.11)
RDW: 15.5 % (ref 11.5–15.5)
WBC: 8.3 10*3/uL (ref 4.0–10.5)
nRBC: 0 % (ref 0.0–0.2)

## 2020-09-15 LAB — BASIC METABOLIC PANEL
Anion gap: 10 (ref 5–15)
BUN: 25 mg/dL — ABNORMAL HIGH (ref 8–23)
CO2: 22 mmol/L (ref 22–32)
Calcium: 9.2 mg/dL (ref 8.9–10.3)
Chloride: 107 mmol/L (ref 98–111)
Creatinine, Ser: 1.66 mg/dL — ABNORMAL HIGH (ref 0.44–1.00)
GFR, Estimated: 32 mL/min — ABNORMAL LOW (ref >60)
Glucose, Bld: 172 mg/dL — ABNORMAL HIGH (ref 70–99)
Potassium: 4.5 mmol/L (ref 3.5–5.1)
Sodium: 139 mmol/L (ref 135–145)

## 2020-09-15 LAB — SARS CORONAVIRUS 2 (TAT 6-24 HRS): SARS Coronavirus 2: NEGATIVE

## 2020-09-17 ENCOUNTER — Other Ambulatory Visit: Payer: Self-pay

## 2020-09-17 ENCOUNTER — Ambulatory Visit (HOSPITAL_COMMUNITY)
Admission: RE | Admit: 2020-09-17 | Discharge: 2020-09-17 | Disposition: A | Discharge status: Home / Self Care | Payer: Medicare Other | Attending: Gastroenterology | Admitting: Gastroenterology

## 2020-09-17 ENCOUNTER — Encounter (HOSPITAL_COMMUNITY)
Admission: RE | Disposition: A | Discharge status: Home / Self Care | Payer: Self-pay | Source: Home / Self Care | Attending: Gastroenterology

## 2020-09-17 ENCOUNTER — Encounter (HOSPITAL_COMMUNITY): Payer: Self-pay | Admitting: Gastroenterology

## 2020-09-17 ENCOUNTER — Ambulatory Visit (HOSPITAL_COMMUNITY): Payer: Medicare Other | Admitting: Certified Registered Nurse Anesthetist

## 2020-09-17 DIAGNOSIS — R1011 Right upper quadrant pain: Secondary | ICD-10-CM | POA: Diagnosis not present

## 2020-09-17 DIAGNOSIS — Z79899 Other long term (current) drug therapy: Secondary | ICD-10-CM | POA: Insufficient documentation

## 2020-09-17 DIAGNOSIS — K5730 Diverticulosis of large intestine without perforation or abscess without bleeding: Secondary | ICD-10-CM | POA: Diagnosis not present

## 2020-09-17 DIAGNOSIS — B9681 Helicobacter pylori [H. pylori] as the cause of diseases classified elsewhere: Secondary | ICD-10-CM | POA: Insufficient documentation

## 2020-09-17 DIAGNOSIS — K3189 Other diseases of stomach and duodenum: Secondary | ICD-10-CM | POA: Diagnosis not present

## 2020-09-17 DIAGNOSIS — Z8673 Personal history of transient ischemic attack (TIA), and cerebral infarction without residual deficits: Secondary | ICD-10-CM | POA: Diagnosis not present

## 2020-09-17 DIAGNOSIS — Z09 Encounter for follow-up examination after completed treatment for conditions other than malignant neoplasm: Secondary | ICD-10-CM | POA: Diagnosis not present

## 2020-09-17 DIAGNOSIS — K2950 Unspecified chronic gastritis without bleeding: Secondary | ICD-10-CM | POA: Diagnosis not present

## 2020-09-17 DIAGNOSIS — Z86010 Personal history of colonic polyps: Secondary | ICD-10-CM | POA: Diagnosis not present

## 2020-09-17 DIAGNOSIS — Z7984 Long term (current) use of oral hypoglycemic drugs: Secondary | ICD-10-CM | POA: Diagnosis not present

## 2020-09-17 DIAGNOSIS — D122 Benign neoplasm of ascending colon: Secondary | ICD-10-CM | POA: Insufficient documentation

## 2020-09-17 DIAGNOSIS — Z794 Long term (current) use of insulin: Secondary | ICD-10-CM | POA: Insufficient documentation

## 2020-09-17 DIAGNOSIS — Z9884 Bariatric surgery status: Secondary | ICD-10-CM | POA: Insufficient documentation

## 2020-09-17 DIAGNOSIS — K648 Other hemorrhoids: Secondary | ICD-10-CM | POA: Insufficient documentation

## 2020-09-17 DIAGNOSIS — Z7982 Long term (current) use of aspirin: Secondary | ICD-10-CM | POA: Insufficient documentation

## 2020-09-17 DIAGNOSIS — K5520 Angiodysplasia of colon without hemorrhage: Secondary | ICD-10-CM | POA: Diagnosis not present

## 2020-09-17 DIAGNOSIS — Z1211 Encounter for screening for malignant neoplasm of colon: Secondary | ICD-10-CM | POA: Insufficient documentation

## 2020-09-17 DIAGNOSIS — Z79890 Hormone replacement therapy (postmenopausal): Secondary | ICD-10-CM | POA: Diagnosis not present

## 2020-09-17 DIAGNOSIS — Z880 Allergy status to penicillin: Secondary | ICD-10-CM | POA: Diagnosis not present

## 2020-09-17 HISTORY — PX: COLONOSCOPY WITH PROPOFOL: SHX5780

## 2020-09-17 HISTORY — PX: BIOPSY: SHX5522

## 2020-09-17 HISTORY — PX: ESOPHAGOGASTRODUODENOSCOPY (EGD) WITH PROPOFOL: SHX5813

## 2020-09-17 LAB — HM COLONOSCOPY

## 2020-09-17 LAB — GLUCOSE, CAPILLARY: Glucose-Capillary: 212 mg/dL — ABNORMAL HIGH (ref 70–99)

## 2020-09-17 SURGERY — COLONOSCOPY WITH PROPOFOL
Anesthesia: General

## 2020-09-17 MED ORDER — PROPOFOL 10 MG/ML IV BOLUS
INTRAVENOUS | Status: DC | PRN
  Administered 2020-09-17: 20 mg via INTRAVENOUS
  Administered 2020-09-17: 80 mg via INTRAVENOUS

## 2020-09-17 MED ORDER — PROPOFOL 500 MG/50ML IV EMUL
INTRAVENOUS | Status: DC | PRN
  Administered 2020-09-17: 150 ug/kg/min via INTRAVENOUS

## 2020-09-17 MED ORDER — LIDOCAINE HCL (CARDIAC) PF 100 MG/5ML IV SOSY
PREFILLED_SYRINGE | INTRAVENOUS | Status: DC | PRN
  Administered 2020-09-17: 50 mg via INTRAVENOUS

## 2020-09-17 MED ORDER — PHENYLEPHRINE HCL (PRESSORS) 10 MG/ML IV SOLN
INTRAVENOUS | Status: DC | PRN
  Administered 2020-09-17: 80 ug via INTRAVENOUS

## 2020-09-17 MED ORDER — LACTATED RINGERS IV SOLN: INTRAVENOUS | Status: DC

## 2020-09-17 NOTE — Anesthesia Preprocedure Evaluation (Signed)
Anesthesia Evaluation  Patient identified by MRN, date of birth, ID band Patient awake    Reviewed: Allergy & Precautions, H&P , NPO status , Patient's Chart, lab work & pertinent test results, reviewed documented beta blocker date and time   Airway Mallampati: II  TM Distance: >3 FB Neck ROM: full    Dental no notable dental hx.    Pulmonary sleep apnea and Continuous Positive Airway Pressure Ventilation ,    Pulmonary exam normal breath sounds clear to auscultation       Cardiovascular Exercise Tolerance: Good hypertension, + CAD and + Past MI   Rhythm:regular Rate:Normal     Neuro/Psych PSYCHIATRIC DISORDERS Anxiety Depression CVA, Residual Symptoms    GI/Hepatic negative GI ROS, Neg liver ROS,   Endo/Other  negative endocrine ROSdiabetes  Renal/GU negative Renal ROS  negative genitourinary   Musculoskeletal   Abdominal   Peds  Hematology  (+) Blood dyscrasia, anemia ,   Anesthesia Other Findings   Reproductive/Obstetrics negative OB ROS                             Anesthesia Physical Anesthesia Plan  ASA: III  Anesthesia Plan: General   Post-op Pain Management:    Induction:   PONV Risk Score and Plan: Propofol infusion  Airway Management Planned:   Additional Equipment:   Intra-op Plan:   Post-operative Plan:   Informed Consent: I have reviewed the patients History and Physical, chart, labs and discussed the procedure including the risks, benefits and alternatives for the proposed anesthesia with the patient or authorized representative who has indicated his/her understanding and acceptance.     Dental Advisory Given  Plan Discussed with: CRNA  Anesthesia Plan Comments:         Anesthesia Quick Evaluation

## 2020-09-17 NOTE — Transfer of Care (Signed)
Immediate Anesthesia Transfer of Care Note  Patient: Jessica Strong  Procedure(s) Performed: COLONOSCOPY WITH PROPOFOL (N/A ) ESOPHAGOGASTRODUODENOSCOPY (EGD) WITH PROPOFOL (N/A ) BIOPSY  Patient Location: PACU  Anesthesia Type:General  Level of Consciousness: awake  Airway & Oxygen Therapy: Patient Spontanous Breathing  Post-op Assessment: Report given to RN and Post -op Vital signs reviewed and stable  Post vital signs: Reviewed and stable  Last Vitals:  Vitals Value Taken Time  BP    Temp    Pulse    Resp    SpO2      Last Pain:  Vitals:   09/17/20 0913  TempSrc: Oral  PainSc: 0-No pain      Patients Stated Pain Goal: 8 (09/81/19 1478)  Complications: No complications documented.

## 2020-09-17 NOTE — Interval H&P Note (Signed)
History and Physical Interval Note:  09/17/2020 10:24 AM Jessica Strong is a 75 y.o. female with past medical history of obesity status post Roux-en-Y gastric bypass, anxiety, depression, diabetes, hyperlipidemia, hypertension, myocardial infarction, stroke, who presents for evaluation of abdominal pain and history of polyps.  Patient states that she has persisted with some discomfort in her right upper quadrant but states that she is no longer having persistent pain that she was having before.  Denies any nausea, vomiting, fever, chills, melena or hematochezia.  BP 115/72   Pulse 65   Temp 98.3 F (36.8 C) (Oral)   Resp 15   Ht 5' (1.524 m)   Wt 104.3 kg   SpO2 99%   BMI 44.92 kg/m  GENERAL: The patient is AO x3, in no acute distress. Obese. HEENT: Head is normocephalic and atraumatic. EOMI are intact. Mouth is well hydrated and without lesions. NECK: Supple. No masses LUNGS: Clear to auscultation. No presence of rhonchi/wheezing/rales. Adequate chest expansion HEART: RRR, normal s1 and s2. ABDOMEN: Soft, nontender, no guarding, no peritoneal signs, and nondistended. BS +. No masses. EXTREMITIES: Without any cyanosis, clubbing, rash, lesions or edema. NEUROLOGIC: AOx3, no focal motor deficit. SKIN: no jaundice, no rashes   [place]  has presented today for surgery, with the diagnosis of RUQ PAIN HISTORY OF COLONIC POLYPYS.  The various methods of treatment have been discussed with the patient and family. After consideration of risks, benefits and other options for treatment, the patient has consented to  Procedure(s) with comments: COLONOSCOPY WITH PROPOFOL (N/A) - 9:00 ESOPHAGOGASTRODUODENOSCOPY (EGD) WITH PROPOFOL (N/A) as a surgical intervention.  The patient's history has been reviewed, patient examined, no change in status, stable for surgery.  I have reviewed the patient's chart and labs.  Questions were answered to the patient's satisfaction.     [name]  [name]

## 2020-09-17 NOTE — Anesthesia Postprocedure Evaluation (Signed)
Anesthesia Post Note  Patient: Jessica Strong  Procedure(s) Performed: COLONOSCOPY WITH PROPOFOL (N/A ) ESOPHAGOGASTRODUODENOSCOPY (EGD) WITH PROPOFOL (N/A ) BIOPSY  Patient location during evaluation: Phase II Anesthesia Type: General Level of consciousness: awake Pain management: pain level controlled Vital Signs Assessment: post-procedure vital signs reviewed and stable Respiratory status: spontaneous breathing and respiratory function stable Cardiovascular status: blood pressure returned to baseline and stable Postop Assessment: no headache and no apparent nausea or vomiting Anesthetic complications: no Comments: Late entry   No complications documented.   Last Vitals:  Vitals:   09/17/20 0913 09/17/20 1118  BP: 115/72 (!) 105/53  Pulse: 65   Resp: 15 18  Temp: 36.8 C 36.4 C  SpO2: 99% 99%    Last Pain:  Vitals:   09/17/20 1118  TempSrc: Oral  PainSc: 0-No pain                 Louann Sjogren

## 2020-09-17 NOTE — Op Note (Signed)
[facility] Patient Name: Jessica Strong Procedure Date: 09/17/2020 10:26 AM MRN: [id number] Date of Birth: 06-07-1945 Attending MD: [name] ,  CSN: [id number] Age: 75 Admit Type: Outpatient Procedure:                Upper GI endoscopy Indications:              Abdominal pain in the right upper quadrant Providers:                [name], Lambert Mody, Kristine L.                            Risa Grill, Technician Referring MD:              Medicines:                Monitored Anesthesia Care Complications:            No immediate complications. Estimated Blood Loss:     Estimated blood loss: none. Procedure:                Pre-Anesthesia Assessment:                           - Prior to the procedure, a History and Physical                            was performed, and patient medications, allergies                            and sensitivities were reviewed. The patient's                            tolerance of previous anesthesia was reviewed.                           - The risks and benefits of the procedure and the                            sedation options and risks were discussed with the                            patient. All questions were answered and informed                            consent was obtained.                           After obtaining informed consent, the endoscope was                            passed under direct vision. Throughout the                            procedure, the patient's blood pressure, pulse, and                            oxygen saturations were  monitored continuously. The                            [id number]-[id number] ([id number]) scope was introduced through the                            mouth, and advanced to the second part of duodenum.                            The upper GI endoscopy was accomplished without                            difficulty. The patient tolerated the procedure                            well. Scope  In: 10:40:53 AM Scope Out: 10:48:57 AM Total Procedure Duration: 0 hours 8 minutes 4 seconds  Findings:      The examined esophagus was normal.      Diffuse atrophic mucosa was found in the gastric antrum. Biopsies were       taken from body and antrum with a cold forceps for Helicobacter pylori       testing. There was no presence of inflammation. Of note, there was no       evidence of any previous gastric/bariatric surgery.      The examined duodenum was normal. Biopsies were taken with a cold       forceps for histology. Impression:               - Normal esophagus.                           - Gastric mucosal atrophy. Biopsied.                           - Normal examined duodenum. Biopsied. Moderate Sedation:      Per Anesthesia Care Recommendation:           - Discharge patient to home (ambulatory).                           - Resume previous diet.                           - Await pathology results. Procedure Code(s):        --- Professional ---                           [phone], Esophagogastroduodenoscopy, flexible,                            transoral; with biopsy, single or multiple Diagnosis Code(s):        --- Professional ---                           K31.89, Other diseases of stomach and duodenum                           R10.11,  Right upper quadrant pain CPT copyright 2019 American [employer]. All rights reserved. The codes documented in this report are preliminary and upon coder review may  be revised to meet current compliance requirements. [name], MD [name],  09/17/2020 11:16:14 AM This report has been signed electronically. Number of Addenda: 0

## 2020-09-17 NOTE — Discharge Instructions (Signed)
You are being discharged to home.  Resume your previous diet.  We are waiting for your pathology results.  Continue Bentyl as needed for abdominal pain. Your physician has recommended a repeat colonoscopy in five years for surveillance.    Colonoscopy, Adult, Care After This sheet gives you information about how to care for yourself after your procedure. Your doctor may also give you more specific instructions. If you have problems or questions, call your doctor. What can I expect after the procedure? After the procedure, it is common to have:  A small amount of blood in your poop (stool) for 24 hours.  Some gas.  Mild cramping or bloating in your belly (abdomen). Follow these instructions at home: Eating and drinking  Drink enough fluid to keep your pee (urine) pale yellow.  Follow instructions from your doctor about what you cannot eat or drink.  Return to your normal diet as told by your doctor. Avoid heavy or fried foods that are hard to digest.   Activity  Rest as told by your doctor.  Do not sit for a long time without moving. Get up to take short walks every 1-2 hours. This is important. Ask for help if you feel weak or unsteady.  Return to your normal activities as told by your doctor. Ask your doctor what activities are safe for you. To help cramping and bloating:  Try walking around.  Put heat on your belly as told by your doctor. Use the heat source that your doctor recommends, such as a moist heat pack or a heating pad. ? Put a towel between your skin and the heat source. ? Leave the heat on for 20-30 minutes. ? Remove the heat if your skin turns bright red. This is very important if you are unable to feel pain, heat, or cold. You may have a greater risk of getting burned.   General instructions  If you were given a medicine to help you relax (sedative) during your procedure, it can affect you for many hours. Do not drive or use machinery until your doctor says  that it is safe.  For the first 24 hours after the procedure: ? Do not sign important documents. ? Do not drink alcohol. ? Do your daily activities more slowly than normal. ? Eat foods that are soft and easy to digest.  Take over-the-counter or prescription medicines only as told by your doctor.  Keep all follow-up visits as told by your doctor. This is important. Contact a doctor if:  You have blood in your poop 2-3 days after the procedure. Get help right away if:  You have more than a small amount of blood in your poop.  You see large clumps of tissue (blood clots) in your poop.  Your belly is swollen.  You feel like you may vomit (nauseous).  You vomit.  You have a fever.  You have belly pain that gets worse, and medicine does not help your pain. Summary  After the procedure, it is common to have a small amount of blood in your poop. You may also have mild cramping and bloating in your belly.  If you were given a medicine to help you relax (sedative) during your procedure, it can affect you for many hours. Do not drive or use machinery until your doctor says that it is safe.  Get help right away if you have a lot of blood in your poop, feel like you may vomit, have a fever, or have more  belly pain. This information is not intended to replace advice given to you by your health care provider. Make sure you discuss any questions you have with your health care provider. Document Revised: 02/21/2019 Document Reviewed: 11/11/2018 Elsevier Patient Education  [place].  Colon Polyps  Colon polyps are tissue growths inside the colon, which is part of the large intestine. They are one of the types of polyps that can grow in the body. A polyp may be a round bump or a mushroom-shaped growth. You could have one polyp or more than one. Most colon polyps are noncancerous (benign). However, some colon polyps can become cancerous over time. Finding and removing the polyps  early can help prevent this. What are the causes? The exact cause of colon polyps is not known. What increases the risk? The following factors may make you more likely to develop this condition:  Having a family history of colorectal cancer or colon polyps.  Being older than 73 years of age.  Being younger than 73 years of age and having a significant family history of colorectal cancer or colon polyps or a genetic condition that puts you at higher risk of getting colon polyps.  Having inflammatory bowel disease, such as ulcerative colitis or Crohn's disease.  Having certain conditions passed from parent to child (hereditary conditions), such as: ? Familial adenomatous polyposis (FAP). ? Lynch syndrome. ? Turcot syndrome. ? Peutz-Jeghers syndrome. ? MUTYH-associated polyposis (MAP).  Being overweight.  Certain lifestyle factors. These include smoking cigarettes, drinking too much alcohol, not getting enough exercise, and eating a diet that is high in fat and red meat and low in fiber.  Having had childhood cancer that was treated with radiation of the abdomen. What are the signs or symptoms? Many times, there are no symptoms. If you have symptoms, they may include:  Blood coming from the rectum during a bowel movement.  Blood in the stool (feces). The blood may be bright red or very dark in color.  Pain in the abdomen.  A change in bowel habits, such as constipation or diarrhea. How is this diagnosed? This condition is diagnosed with a colonoscopy. This is a procedure in which a lighted, flexible scope is inserted into the opening between the buttocks (anus) and then passed into the colon to examine the area. Polyps are sometimes found when a colonoscopy is done as part of routine cancer screening tests. How is this treated? This condition is treated by removing any polyps that are found. Most polyps can be removed during a colonoscopy. Those polyps will then be tested for  cancer. Additional treatment may be needed depending on the results of testing. Follow these instructions at home: Eating and drinking  Eat foods that are high in fiber, such as fruits, vegetables, and whole grains.  Eat foods that are high in calcium and vitamin D, such as milk, cheese, yogurt, eggs, liver, fish, and broccoli.  Limit foods that are high in fat, such as fried foods and desserts.  Limit the amount of red meat, precooked or cured meat, or other processed meat that you eat, such as hot dogs, sausages, bacon, or meat loaves.  Limit sugary drinks.   Lifestyle  Maintain a healthy weight, or lose weight if recommended by your health care provider.  Exercise every day or as told by your health care provider.  Do not use any products that contain nicotine or tobacco, such as cigarettes, e-cigarettes, and chewing tobacco. If you need help quitting, ask  your health care provider.  Do not drink alcohol if: ? Your health care provider tells you not to drink. ? You are pregnant, may be pregnant, or are planning to become pregnant.  If you drink alcohol: ? Limit how much you use to:  0-1 drink a day for women.  0-2 drinks a day for men. ? Know how much alcohol is in your drink. In the [place] one drink equals one 12 oz bottle of beer (355 mL), one 5 oz glass of wine (148 mL), or one 1 oz glass of hard liquor (44 mL). General instructions  Take over-the-counter and prescription medicines only as told by your health care provider.  Keep all follow-up visits. This is important. This includes having regularly scheduled colonoscopies. Talk to your health care provider about when you need a colonoscopy. Contact a health care provider if:  You have new or worsening bleeding during a bowel movement.  You have new or increased blood in your stool.  You have a change in bowel habits.  You lose weight for no known reason. Summary  Colon polyps are tissue growths inside the  colon, which is part of the large intestine. They are one type of polyp that can grow in the body.  Most colon polyps are noncancerous (benign), but some can become cancerous over time.  This condition is diagnosed with a colonoscopy.  This condition is treated by removing any polyps that are found. Most polyps can be removed during a colonoscopy. This information is not intended to replace advice given to you by your health care provider. Make sure you discuss any questions you have with your health care provider. Document Revised: 08/06/2019 Document Reviewed: 08/06/2019 Elsevier Patient Education  2021 [place].  Colon Polyps  Colon polyps are tissue growths inside the colon, which is part of the large intestine. They are one of the types of polyps that can grow in the body. A polyp may be a round bump or a mushroom-shaped growth. You could have one polyp or more than one. Most colon polyps are noncancerous (benign). However, some colon polyps can become cancerous over time. Finding and removing the polyps early can help prevent this. What are the causes? The exact cause of colon polyps is not known. What increases the risk? The following factors may make you more likely to develop this condition:  Having a family history of colorectal cancer or colon polyps.  Being older than 73 years of age.  Being younger than 73 years of age and having a significant family history of colorectal cancer or colon polyps or a genetic condition that puts you at higher risk of getting colon polyps.  Having inflammatory bowel disease, such as ulcerative colitis or Crohn's disease.  Having certain conditions passed from parent to child (hereditary conditions), such as: ? Familial adenomatous polyposis (FAP). ? Lynch syndrome. ? Turcot syndrome. ? Peutz-Jeghers syndrome. ? MUTYH-associated polyposis (MAP).  Being overweight.  Certain lifestyle factors. These include smoking cigarettes,  drinking too much alcohol, not getting enough exercise, and eating a diet that is high in fat and red meat and low in fiber.  Having had childhood cancer that was treated with radiation of the abdomen. What are the signs or symptoms? Many times, there are no symptoms. If you have symptoms, they may include:  Blood coming from the rectum during a bowel movement.  Blood in the stool (feces). The blood may be bright red or very dark in color.  Pain in  the abdomen.  A change in bowel habits, such as constipation or diarrhea. How is this diagnosed? This condition is diagnosed with a colonoscopy. This is a procedure in which a lighted, flexible scope is inserted into the opening between the buttocks (anus) and then passed into the colon to examine the area. Polyps are sometimes found when a colonoscopy is done as part of routine cancer screening tests. How is this treated? This condition is treated by removing any polyps that are found. Most polyps can be removed during a colonoscopy. Those polyps will then be tested for cancer. Additional treatment may be needed depending on the results of testing. Follow these instructions at home: Eating and drinking  Eat foods that are high in fiber, such as fruits, vegetables, and whole grains.  Eat foods that are high in calcium and vitamin D, such as milk, cheese, yogurt, eggs, liver, fish, and broccoli.  Limit foods that are high in fat, such as fried foods and desserts.  Limit the amount of red meat, precooked or cured meat, or other processed meat that you eat, such as hot dogs, sausages, bacon, or meat loaves.  Limit sugary drinks.   Lifestyle  Maintain a healthy weight, or lose weight if recommended by your health care provider.  Exercise every day or as told by your health care provider.  Do not use any products that contain nicotine or tobacco, such as cigarettes, e-cigarettes, and chewing tobacco. If you need help quitting, ask your  health care provider.  Do not drink alcohol if: ? Your health care provider tells you not to drink. ? You are pregnant, may be pregnant, or are planning to become pregnant.  If you drink alcohol: ? Limit how much you use to:  0-1 drink a day for women.  0-2 drinks a day for men. ? Know how much alcohol is in your drink. In the [place] one drink equals one 12 oz bottle of beer (355 mL), one 5 oz glass of wine (148 mL), or one 1 oz glass of hard liquor (44 mL). General instructions  Take over-the-counter and prescription medicines only as told by your health care provider.  Keep all follow-up visits. This is important. This includes having regularly scheduled colonoscopies. Talk to your health care provider about when you need a colonoscopy. Contact a health care provider if:  You have new or worsening bleeding during a bowel movement.  You have new or increased blood in your stool.  You have a change in bowel habits.  You lose weight for no known reason. Summary  Colon polyps are tissue growths inside the colon, which is part of the large intestine. They are one type of polyp that can grow in the body.  Most colon polyps are noncancerous (benign), but some can become cancerous over time.  This condition is diagnosed with a colonoscopy.  This condition is treated by removing any polyps that are found. Most polyps can be removed during a colonoscopy. This information is not intended to replace advice given to you by your health care provider. Make sure you discuss any questions you have with your health care provider. Document Revised: 08/06/2019 Document Reviewed: 08/06/2019 Elsevier Patient Education  2021 [employer].

## 2020-09-17 NOTE — Op Note (Signed)
[facility] Patient Name: Jessica Strong Procedure Date: 09/17/2020 10:51 AM MRN: [id number] Date of Birth: March 13, 1946 Attending MD: [name] ,  CSN: [id number] Age: 75 Admit Type: Outpatient Procedure:                Colonoscopy Indications:              High risk colon cancer surveillance: Personal                            history of colonic polyps Providers:                [name], Lambert Mody, Kristine L.                            Risa Grill, Technician Referring MD:              Medicines:                Monitored Anesthesia Care Complications:            No immediate complications. Estimated Blood Loss:     Estimated blood loss: none. Procedure:                Pre-Anesthesia Assessment:                           - Prior to the procedure, a History and Physical                            was performed, and patient medications, allergies                            and sensitivities were reviewed. The patient's                            tolerance of previous anesthesia was reviewed.                           - The risks and benefits of the procedure and the                            sedation options and risks were discussed with the                            patient. All questions were answered and informed                            consent was obtained.                           After obtaining informed consent, the colonoscope                            was passed under direct vision. Throughout the                            procedure, the patient's blood pressure, pulse, and  oxygen saturations were monitored continuously. The                            [id number]-[id number] ([id number]) scope was introduced through                            the anus and advanced to the the cecum, identified                            by appendiceal orifice and ileocecal valve. The                            colonoscopy was performed without  difficulty. The                            patient tolerated the procedure well. The quality                            of the bowel preparation was good. Scope In: 10:54:22 AM Scope Out: 11:14:27 AM Scope Withdrawal Time: 0 hours 12 minutes 53 seconds  Total Procedure Duration: 0 hours 20 minutes 5 seconds  Findings:      The perianal and digital rectal examinations were normal.      A single medium-sized localized angiodysplastic lesion without bleeding       was found in the cecum.      A single medium-sized localized angiodysplastic lesion without bleeding       was found in the proximal ascending colon.      A 2 mm polyp was found in the ascending colon. The polyp was sessile.       The polyp was removed with a cold biopsy forceps. Resection and       retrieval were complete.      A few small-mouthed diverticula were found in the sigmoid colon and       descending colon.      Non-bleeding internal hemorrhoids were found during retroflexion. The       hemorrhoids were small. Impression:               - A single non-bleeding colonic angiodysplastic                            lesion.                           - A single non-bleeding colonic angiodysplastic                            lesion.                           - One 2 mm polyp in the ascending colon, removed                            with a cold biopsy forceps. Resected and retrieved.                           -  Diverticulosis in the sigmoid colon and in the                            descending colon.                           - Non-bleeding internal hemorrhoids. Moderate Sedation:      Per Anesthesia Care Recommendation:           - Discharge patient to home (ambulatory).                           - Resume previous diet.                           - Await pathology results.                           - Repeat colonoscopy in 5 years for surveillance. Procedure Code(s):        --- Professional ---                            [phone], Colonoscopy, flexible; with biopsy, single                            or multiple Diagnosis Code(s):        --- Professional ---                           Z86.010, Personal history of colonic polyps                           K55.20, Angiodysplasia of colon without hemorrhage                           K63.5, Polyp of colon                           K64.8, Other hemorrhoids                           K57.30, Diverticulosis of large intestine without                            perforation or abscess without bleeding CPT copyright 2019 American Medical Association. All rights reserved. The codes documented in this report are preliminary and upon coder review may  be revised to meet current compliance requirements. [name], MD [name],  09/17/2020 11:21:51 AM This report has been signed electronically. Number of Addenda: 0

## 2020-09-20 ENCOUNTER — Other Ambulatory Visit (INDEPENDENT_AMBULATORY_CARE_PROVIDER_SITE_OTHER): Payer: Self-pay | Admitting: Gastroenterology

## 2020-09-20 DIAGNOSIS — B9681 Helicobacter pylori [H. pylori] as the cause of diseases classified elsewhere: Secondary | ICD-10-CM

## 2020-09-20 DIAGNOSIS — K2970 Gastritis, unspecified, without bleeding: Secondary | ICD-10-CM

## 2020-09-20 LAB — SURGICAL PATHOLOGY

## 2020-09-20 MED ORDER — METRONIDAZOLE 500 MG PO TABS: 500.0000 mg | ORAL_TABLET | Freq: Three times a day (TID) | ORAL | 0 refills | Status: DC

## 2020-09-20 MED ORDER — OMEPRAZOLE 40 MG PO CPDR: 40.0000 mg | DELAYED_RELEASE_CAPSULE | Freq: Two times a day (BID) | ORAL | 0 refills | Status: DC

## 2020-09-20 MED ORDER — TETRACYCLINE HCL 500 MG PO CAPS: 500.0000 mg | ORAL_CAPSULE | Freq: Four times a day (QID) | ORAL | 0 refills | Status: DC

## 2020-09-20 MED ORDER — BISMUTH 262 MG PO CHEW: 2.0000 | CHEWABLE_TABLET | Freq: Four times a day (QID) | ORAL | 0 refills | Status: DC

## 2020-09-21 ENCOUNTER — Encounter (INDEPENDENT_AMBULATORY_CARE_PROVIDER_SITE_OTHER): Payer: Self-pay | Admitting: *Deleted

## 2020-09-23 ENCOUNTER — Encounter (HOSPITAL_COMMUNITY): Payer: Self-pay | Admitting: Gastroenterology

## 2020-09-28 ENCOUNTER — Other Ambulatory Visit (INDEPENDENT_AMBULATORY_CARE_PROVIDER_SITE_OTHER): Payer: Self-pay | Admitting: Gastroenterology

## 2020-09-28 ENCOUNTER — Telehealth (INDEPENDENT_AMBULATORY_CARE_PROVIDER_SITE_OTHER): Payer: Self-pay | Admitting: Gastroenterology

## 2020-09-28 DIAGNOSIS — K2970 Gastritis, unspecified, without bleeding: Secondary | ICD-10-CM

## 2020-09-28 DIAGNOSIS — B9681 Helicobacter pylori [H. pylori] as the cause of diseases classified elsewhere: Secondary | ICD-10-CM

## 2020-09-28 MED ORDER — AMOXICILLIN 500 MG PO TABS
1000.0000 mg | ORAL_TABLET | Freq: Two times a day (BID) | ORAL | 0 refills | Status: DC
Start: 2020-09-28 — End: 2020-09-29

## 2020-09-28 MED ORDER — OMEPRAZOLE 40 MG PO CPDR: 40.0000 mg | DELAYED_RELEASE_CAPSULE | Freq: Two times a day (BID) | ORAL | 0 refills | Status: DC

## 2020-09-28 MED ORDER — CLARITHROMYCIN 500 MG PO TABS: 500.0000 mg | ORAL_TABLET | Freq: Two times a day (BID) | ORAL | 0 refills | Status: AC

## 2020-09-28 NOTE — Telephone Encounter (Signed)
Patient called the office stated she had a colonoscopy on 5/20 - was prescribed an antibiotic that she can't take - please advise - ph# [phone]

## 2020-09-28 NOTE — Telephone Encounter (Signed)
Spoke with the patient, she has not been able to tolerate the treatment for H. Pylori.  I will prescribe triple regimen with amoxicillin and clarithromycin as she had to stop taking the medication given the presence of persistent nausea.

## 2020-09-29 ENCOUNTER — Other Ambulatory Visit: Payer: Self-pay

## 2020-09-29 ENCOUNTER — Ambulatory Visit: Payer: Medicare Other | Admitting: Internal Medicine

## 2020-09-29 ENCOUNTER — Other Ambulatory Visit (HOSPITAL_COMMUNITY)
Admission: RE | Admit: 2020-09-29 | Discharge: 2020-09-29 | Disposition: A | Discharge status: Home / Self Care | Payer: Medicare Other | Source: Ambulatory Visit | Attending: Internal Medicine | Admitting: Internal Medicine

## 2020-09-29 ENCOUNTER — Encounter: Payer: Self-pay | Admitting: Internal Medicine

## 2020-09-29 ENCOUNTER — Ambulatory Visit (HOSPITAL_COMMUNITY)
Admission: RE | Admit: 2020-09-29 | Discharge: 2020-09-29 | Disposition: A | Discharge status: Home / Self Care | Payer: Medicare Other | Source: Ambulatory Visit | Attending: Gastroenterology | Admitting: Gastroenterology

## 2020-09-29 DIAGNOSIS — R0609 Other forms of dyspnea: Secondary | ICD-10-CM

## 2020-09-29 DIAGNOSIS — R0600 Dyspnea, unspecified: Secondary | ICD-10-CM | POA: Diagnosis not present

## 2020-09-29 DIAGNOSIS — I10 Essential (primary) hypertension: Secondary | ICD-10-CM | POA: Diagnosis not present

## 2020-09-29 DIAGNOSIS — R1011 Right upper quadrant pain: Secondary | ICD-10-CM | POA: Insufficient documentation

## 2020-09-29 LAB — CBC WITH DIFFERENTIAL/PLATELET
Abs Immature Granulocytes: 0.05 10*3/uL (ref 0.00–0.07)
Basophils Absolute: 0.1 10*3/uL (ref 0.0–0.1)
Basophils Relative: 1 %
Eosinophils Absolute: 0.1 10*3/uL (ref 0.0–0.5)
Eosinophils Relative: 1 %
HCT: 35.2 % — ABNORMAL LOW (ref 36.0–46.0)
Hemoglobin: 10.9 g/dL — ABNORMAL LOW (ref 12.0–15.0)
Immature Granulocytes: 1 %
Lymphocytes Relative: 16 %
Lymphs Abs: 1.5 10*3/uL (ref 0.7–4.0)
MCH: 26.5 pg (ref 26.0–34.0)
MCHC: 31 g/dL (ref 30.0–36.0)
MCV: 85.4 fL (ref 80.0–100.0)
Monocytes Absolute: 0.8 10*3/uL (ref 0.1–1.0)
Monocytes Relative: 9 %
Neutro Abs: 6.8 10*3/uL (ref 1.7–7.7)
Neutrophils Relative %: 72 %
Platelets: 275 10*3/uL (ref 150–400)
RBC: 4.12 MIL/uL (ref 3.87–5.11)
RDW: 15.5 % (ref 11.5–15.5)
WBC: 9.3 10*3/uL (ref 4.0–10.5)
nRBC: 0 % (ref 0.0–0.2)

## 2020-09-29 LAB — D-DIMER, QUANTITATIVE: D-Dimer, Quant: 0.58 ug/mL-FEU — ABNORMAL HIGH (ref 0.00–0.50)

## 2020-09-29 LAB — TSH: TSH: 1.533 u[IU]/mL (ref 0.350–4.500)

## 2020-09-29 LAB — BRAIN NATRIURETIC PEPTIDE: B Natriuretic Peptide: 34 pg/mL (ref 0.0–100.0)

## 2020-09-29 MED ORDER — OLMESARTAN MEDOXOMIL-HCTZ 20-12.5 MG PO TABS: 1.0000 | ORAL_TABLET | Freq: Every day | ORAL | 11 refills | Status: DC

## 2020-09-29 MED ORDER — IOHEXOL 300 MG/ML  SOLN
80.0000 mL | Freq: Once | INTRAMUSCULAR | Status: AC | PRN
  Administered 2020-09-29: 80 mL via INTRAVENOUS

## 2020-09-29 NOTE — Patient Instructions (Signed)
Stop lisinopril and start olmesartan 20/12.5 in its place  Omeprazole 40 mg   Take  30-60 min before first meal of the day    Please remember to go to the lab and x-ray department at [facility]   for your tests - we will call you with the results when they are available.      Please schedule a follow up office visit in 6 weeks, call sooner if needed

## 2020-09-29 NOTE — Progress Notes (Signed)
Jessica Strong, female    DOB: 1946-02-01,     MRN: [id number]   Brief patient profile:  62 yobf never smoker worked as RT  And started having year round rhinitis in her 25s - 55's much better and no flares off p 10 year with new onset doe age 75 and progressively worse since with cardiac eval by [facility] neg so referred to pulmonary clinic in [facility]  09/29/2020 by Dr  Woody Seller.        History of Present Illness  09/29/2020  Pulmonary/ 1st office eval/ Jessica Strong /  Office  Chief Complaint  Patient presents with  . Pulmonary Consult    Referred by Dr Catalina Lunger. Pt c/o DOE for 2 years, progressively worsening to the point she is winded walking room to room.   Dyspnea:  Stopped working  2007 at same wt as is now, not limited by breathing. By 2017 was being eval for sob with nl pfts performed on 12/10/15. Last walked streets in front of her house  x 3-4 years ago Last grocery store [employer] pushing basket one week prior to OV    Cough: dry hs  Sleep: bed is flat one pillow / some noct choking on cpap - Rx  per [facility]  SABA use: none   No obvious day to day or daytime variability or assoc excess/ purulent sputum or mucus plugs or hemoptysis or cp or chest tightness, subjective wheeze or overt sinus or hb symptoms.    Also denies any obvious fluctuation of symptoms with weather or environmental changes or other aggravating or alleviating factors except as outlined above   No unusual exposure hx or h/o childhood pna/ asthma or knowledge of premature birth.  Current Allergies, Complete Past Medical History, Past Surgical History, Family History, and Social History were reviewed in [employer] record.  ROS  The following are not active complaints unless bolded Hoarseness, sore throat, dysphagia, dental problems, itching, sneezing,  nasal congestion or discharge of excess mucus or purulent secretions, ear ache,   fever, chills, sweats, unintended wt loss or wt gain, classically  pleuritic or exertional cp,  orthopnea pnd or arm/hand swelling  or leg swelling, presyncope, palpitations, abdominal pain, anorexia, nausea, vomiting, diarrhea  or change in bowel habits or change in bladder habits, change in stools or change in urine, dysuria, hematuria,  rash, arthralgias, visual complaints, headache, numbness, weakness or ataxia or problems with walking or coordination,  change in mood or  memory.           Past Medical History:  Diagnosis Date  . Allergy   . Anemia   . Anxiety   . Arthritis   . Cataract   . cervical ca 1990  . Depression   . Diabetes ([place])   . Hypercholesterolemia   . Hypertension   . Myocardial infarction ([place])   . Sleep apnea    on CPAP 14 mm PS since February 2017  . Stroke [facility])    weakness on R side  . Vitamin D deficiency     Outpatient Medications Prior to Visit  Medication Sig Dispense Refill  . ACCU-CHEK AVIVA PLUS test strip Inject 1 strip as directed daily.  4  . acetaminophen (TYLENOL) 500 MG tablet Take 500 mg by mouth every 6 (six) hours as needed for mild pain or headache. As needed.    [name] aspirin 81 MG EC tablet Take 81 mg by mouth daily.    . Calcium Carb-Cholecalciferol (CALCIUM 600-D PO)  Take 1 tablet by mouth 2 (two) times a week.    . dicyclomine (BENTYL) 10 MG capsule Take 1 capsule (10 mg total) by mouth every 8 (eight) hours as needed for spasms. 60 capsule 2  . EPINEPHrine 0.3 mg/0.3 mL IJ SOAJ injection Inject 0.3 mg into the muscle as needed for anaphylaxis.    [name] ergocalciferol (VITAMIN D2) 50000 units capsule Take 5,000 Units by mouth 2 (two) times daily.    . ferrous sulfate 325 (65 FE) MG tablet Take 325 mg by mouth daily with breakfast.    . glimepiride (AMARYL) 4 MG tablet Take 4 mg by mouth 2 (two) times daily.    . insulin lispro (HUMALOG) 100 UNIT/ML injection Inject 10 Units into the skin 3 (three) times daily before meals. If blood glucose is over 150    . Lancets Thin MISC Inject 1 Stick into the skin  daily.    [name] levothyroxine (SYNTHROID) 25 MCG tablet Take 25 mcg by mouth daily.    [name] lisinopril-hydrochlorothiazide (PRINZIDE,ZESTORETIC) 20-12.5 MG tablet Take 1 tablet by mouth daily.     . metFORMIN (GLUCOPHAGE) 1000 MG tablet Take 1 tablet (1,000 mg total) by mouth 2 (two) times daily.    . Multiple Vitamins-Minerals (MULTIVITAMIN ADULT) CHEW Chew 2 tablets by mouth daily at 12 noon.    [name] POTASSIUM PO Take 10 mg by mouth every evening.    . simvastatin (ZOCOR) 20 MG tablet Take 20 mg by mouth every evening.     . clarithromycin (BIAXIN) 500 MG tablet Take 1 tablet (500 mg total) by mouth 2 (two) times daily for 14 days. (Patient not taking: Reported on 09/29/2020) 28 tablet 0  . amoxicillin (AMOXIL) 500 MG tablet Take 2 tablets (1,000 mg total) by mouth 2 (two) times daily for 14 days. (Patient not taking: Reported on 09/29/2020) 56 tablet 0  .     0      Objective:     BP 126/80 (BP Location: Left Arm, Cuff Size: Large)   Pulse 77   Temp 97.6 F (36.4 C) (Temporal)   Ht 5' (1.524 m)   Wt 228 lb (103.4 kg)   SpO2 100% Comment: on RA  BMI 44.53 kg/m   SpO2: 100 % (on RA)   MO amb bf nad   HEENT : pt wearing mask not removed for exam due to covid -19 concerns.    NECK :  without JVD/Nodes/TM/ nl carotid upstrokes bilaterally   LUNGS: no acc muscle use,  Nl contour chest which is clear to A and P bilaterally without cough on insp or exp maneuvers   CV:  RRR  no s3 or murmur or increase in P2, and no edema   ABD:  soft and nontender with nl inspiratory excursion in the supine position. No bruits or organomegaly appreciated, bowel sounds nl  MS:  Nl gait/ ext warm without deformities, calf tenderness, cyanosis or clubbing No obvious joint restrictions   SKIN: warm and dry without lesions    NEURO:  alert, approp, nl sensorium with  no motor or cerebellar deficits apparent.     CXR PA and Lateral:   09/29/2020 :    I personally reviewed images and agree with radiology  impression as follows:    did not go to cxr as rec  Labs ordered/ reviewed:      Chemistry      Component Value Date/Time   NA 139 09/15/2020 1037   K 4.5 09/15/2020 1037  CL 107 09/15/2020 1037   CO2 22 09/15/2020 1037   BUN 25 (H) 09/15/2020 1037   CREATININE 1.66 (H) 09/15/2020 1037      Component Value Date/Time   CALCIUM 9.2 09/15/2020 1037        Lab Results  Component Value Date   WBC 9.3 09/29/2020   HGB 10.9 (L) 09/29/2020   HCT 35.2 (L) 09/29/2020   MCV 85.4 09/29/2020   PLT 275 09/29/2020        EOS                                                             0.1                                     09/29/2020   Lab Results  Component Value Date   DDIMER 0.58 (H) 09/29/2020      Lab Results  Component Value Date   TSH 1.533 09/29/2020     BNP   09/29/2020      34            Assessment   No problem-specific Assessment & Plan notes found for this encounter.     Christinia Gully, MD 09/29/2020

## 2020-09-30 ENCOUNTER — Encounter: Payer: Self-pay | Admitting: Internal Medicine

## 2020-09-30 NOTE — Assessment & Plan Note (Signed)
D/c acei 09/29/2020 noct choking/ unexplained doe   In the best review of chronic cough to date ( NEJM 2016 375 5456-2563) ,  ACEi are now felt to cause cough in up to  20% of pts which is a 4 fold increase from previous reports and does not include the variety of non-specific complaints we see in pulmonary clinic in pts on ACEi but previously attributed to another dx like  Copd/asthma and  include PNDS, throat and chest congestion, "bronchitis", unexplained dyspnea and noct "strangling"/choking sensations, and hoarseness, but also  atypical /refractory GERD symptoms like dysphagia and "bad heartburn"   The only way I know  to prove this is not an "ACEi Case" is a trial off ACEi x a minimum of 6 weeks then regroup.   >>> try benicar 20/12.5 mg daily and self monitor bp

## 2020-09-30 NOTE — Assessment & Plan Note (Signed)
Body mass index is 44.53 kg/m.   Lab Results  Component Value Date   TSH 1.533 09/29/2020     Contributing to gerd risk/ doe/reviewed the need and the process to achieve and maintain neg calorie balance > defer f/u primary care including intermittently monitoring thyroid status          Each maintenance medication was reviewed in detail including emphasizing most importantly the difference between maintenance and prns and under what circumstances the prns are to be triggered using an action plan format where appropriate.  Total time for H and P, chart review, counseling,  and generating customized AVS unique to this new pt  office visit / same day charting  > 45 min

## 2020-10-01 ENCOUNTER — Telehealth (INDEPENDENT_AMBULATORY_CARE_PROVIDER_SITE_OTHER): Payer: Self-pay | Admitting: Gastroenterology

## 2020-10-01 LAB — IRON AND TIBC
Iron: 55 ug/dL (ref 28–170)
Saturation Ratios: 18 % (ref 10.4–31.8)
TIBC: 300 ug/dL (ref 250–450)
UIBC: 245 ug/dL

## 2020-10-01 NOTE — Telephone Encounter (Signed)
Spoke with patient today, I told her that she had a normal CT scan of the abdomen and pelvis.  It is very possible her abdominal pain is related to her Helicobacter pylori.  Unfortunately she has not been able to tolerate the quadruple bismuth regimen that was prescribed in the past and she had an allergic reaction to penicillin in the past (had a rash). Unfortunately, her options for other regimens are very limited.  Due to this, I will refer her case to an infectious disease doctor to determine the best treatment course for her infection.   Hi Ann, can you please refer the patient to infectious disease specialist at [facility]? Dx: H. pylori, intolerant/allergic to antibiotic regimens  Thanks,  [name], MD Gastroenterology and Hepatology [facility]

## 2020-10-01 NOTE — Telephone Encounter (Signed)
Referral placed in EPIC 

## 2020-10-02 ENCOUNTER — Encounter: Payer: Self-pay | Admitting: Internal Medicine

## 2020-10-02 NOTE — Assessment & Plan Note (Signed)
Onset around 2017 -  nl pfts  12/10/15  - try off acei and on gerd max rx 09/29/2020   Progressive Symptoms are markedly disproportionate to objective findings and not clear to what extent this is actually a pulmonary  problem but pt does appear to have difficult to sort out respiratory symptoms of unknown origin for which  DDX  = almost all start with A and  include Adherence, Ace Inhibitors, Acid Reflux, Active Sinus Disease, Alpha 1 Antitripsin deficiency, Anxiety masquerading as Airways dz,  ABPA,  Allergy(esp in young), Aspiration (esp in elderly), Adverse effects of meds,  Active smoking or Vaping, A bunch of PE's/clot burden (a few small clots can't cause this syndrome unless there is already severe underlying pulm or vascular dz with poor reserve),  Anemia or thyroid disorder, plus two Bs  = Bronchiectasis and Beta blocker use..and one C= CHF   Adherence is always the initial "prime suspect" and is a multilayered concern that requires a "trust but verify" approach in every patient - starting with knowing how to use medications, especially inhalers, correctly, keeping up with refills and understanding the fundamental difference between maintenance and prns vs those medications only taken for a very short course and then stopped and not refilled.   ACEi adverse effects at the  top of the usual list of suspects since she has assoc dry cough  and the only way to rule it out is a trial off > see a/p    ? Acid (or non-acid) GERD > always difficult to exclude as up to 75% of pts in some series report no assoc GI/ Heartburn symptoms> rec max (24h)  acid suppression and diet restrictions/ reviewed and instructions given in writing.   ? Anxiety /depression/ deconditioning> usually at the bottom of this list of usual suspects but  may interfere with adherence and also interpretation of response or lack thereof to symptom management which can be quite subjective.   ? Allergy/ asthma > really nothing to  suggest with no Eos, rhinitis no longer a complaint  ? Anemia/ thryoid dz   Lab Results  Component Value Date   HGB 10.9 (L) 09/29/2020   HGB 10.5 (L) 09/15/2020   HGB 11.5 (L) 07/09/2017    Lab Results  Component Value Date   TSH 1.533 09/29/2020      ? A bunc of PE's   >  D dimer  High nl - while a normal  or high normal value (seen commonly in the elderly or chronically ill)  may miss small peripheral pe, the clot burden with sob is moderately high and the d dimer  has a very high neg pred value if used in this setting.   ? chf > Dr Harl Bowie w/u already, bnp very low so unlikely

## 2020-10-13 ENCOUNTER — Other Ambulatory Visit: Payer: Self-pay

## 2020-10-13 ENCOUNTER — Telehealth: Payer: Self-pay

## 2020-10-13 ENCOUNTER — Ambulatory Visit: Payer: Medicare Other | Admitting: Infectious Diseases

## 2020-10-13 ENCOUNTER — Encounter: Payer: Self-pay | Admitting: Infectious Diseases

## 2020-10-13 VITALS — BP 134/81 | HR 69 | Temp 98.1°F | Resp 18 | Ht 60.0 in | Wt 239.8 lb

## 2020-10-13 DIAGNOSIS — B9681 Helicobacter pylori [H. pylori] as the cause of diseases classified elsewhere: Secondary | ICD-10-CM | POA: Insufficient documentation

## 2020-10-13 DIAGNOSIS — Z880 Allergy status to penicillin: Secondary | ICD-10-CM

## 2020-10-13 DIAGNOSIS — I639 Cerebral infarction, unspecified: Secondary | ICD-10-CM

## 2020-10-13 DIAGNOSIS — B353 Tinea pedis: Secondary | ICD-10-CM | POA: Diagnosis not present

## 2020-10-13 DIAGNOSIS — K2970 Gastritis, unspecified, without bleeding: Secondary | ICD-10-CM

## 2020-10-13 MED ORDER — CLARITHROMYCIN 500 MG PO TABS
500.0000 mg | ORAL_TABLET | Freq: Two times a day (BID) | ORAL | 0 refills | Status: DC
Start: 2020-10-13 — End: 2020-11-06

## 2020-10-13 MED ORDER — ROSUVASTATIN CALCIUM 5 MG PO TABS: 10.0000 mg | ORAL_TABLET | Freq: Every day | ORAL | Status: DC

## 2020-10-13 MED ORDER — PANTOPRAZOLE SODIUM 40 MG PO TBEC: 40.0000 mg | DELAYED_RELEASE_TABLET | Freq: Every day | ORAL | 0 refills | Status: DC

## 2020-10-13 MED ORDER — METRONIDAZOLE 500 MG PO TABS
500.0000 mg | ORAL_TABLET | Freq: Three times a day (TID) | ORAL | 0 refills | Status: DC
Start: 2020-10-13 — End: 2020-11-06

## 2020-10-13 MED ORDER — PANTOPRAZOLE SODIUM 40 MG PO TBEC: 40.0000 mg | DELAYED_RELEASE_TABLET | Freq: Two times a day (BID) | ORAL | 0 refills | Status: DC

## 2020-10-13 MED ORDER — ROSUVASTATIN CALCIUM 5 MG PO TABS: 10.0000 mg | ORAL_TABLET | Freq: Every day | ORAL | Status: AC

## 2020-10-13 MED ORDER — METRONIDAZOLE 500 MG PO TABS: 500.0000 mg | ORAL_TABLET | Freq: Two times a day (BID) | ORAL | 0 refills | Status: DC

## 2020-10-13 NOTE — Progress Notes (Signed)
[facility]                                                             [place], [place], [place], [address]                                                                  Phn. [phone]; Fax: [phone]                                                                             Date: 10/13/20  Reason for Referral: H pylori Infection  Requesting  Provider: Jerene Bears   Assessment Problem List Items Addressed This Visit       Cardiovascular and Mediastinum   CVA (cerebral vascular accident) ([place])   Relevant Medications   rosuvastatin (CRESTOR) tablet 10 mg     Digestive   Helicobacter pylori gastritis - Primary   Relevant Medications   clarithromycin (BIAXIN) 500 MG tablet   metroNIDAZOLE (FLAGYL) 500 MG tablet     Musculoskeletal and Integument   Tinea pedis of both feet   Relevant Medications   clarithromycin (BIAXIN) 500 MG tablet   metroNIDAZOLE (FLAGYL) 500 MG tablet     Other   Penicillin allergy   Relevant Orders   Ambulatory referral to Allergy   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 twice daily, clarithromycin 500 mg p.o. twice daily and metronidazole 500 mg p.o. TID for 14 days I have instructed patient to stop simvastatin and take rosuvastatin 10 mg p.o. daily instead during the time while she is on antibiotic treatment for H. pylori due to DDI between clarithromycin and simvastatin I will also place a referral to allergy and immunology given her history of remote penicillin allergy I will follow her up in 2 weeks  All questions and concerns were discussed and addressed. Patient verbalized understanding of the  plan. ____________________________________________________________________________________________________________________  HPI:  75 year old female with past medical history of obesity status post Roux-en-Y gastric bypass, anxiety, depression, DM, HLD, HTN, MI, CVA who is referred from GI for evaluation and treatment of H. pylori infection. 75 year old female with past medical history of obesity status post Roux-en-Y gastric bypass, anxiety, depression, DM, HLD, HTN, MI, CVA who is referred from GI for evaluation and treatment of H. pylori infection.  Patient was seen in the GI office on 4/28 for evaluation of abdominal pain.  She underwent EGD and colonoscopy on 5/20 with findings mentioned in the imaging section.  Path was positive for chronic active gastritis with positive H. Pylori.  She was started on quadruple therapy but was unable to tolerate.  She started nausea and vomiting after  taking the treatment for 2 to 3 days.  She is unsure if any or all of them is related to her sickness.  She was recommended triple therapy with clarithromycin but that had to be discontinued in the setting of penicillin allergy.  She denies ever being treated to H. pylori in the past and this is the first time she was told that she had H. pylori infection.  Patient states that she has been having abdominal pain for 2 years approximately, reason she was seeing a gastroenterologist.  She describes the abdominal pain at the upper abdomen, intermittent in nature, intensity is 4-5 out of 10, no aggravating or relieving factors, denies any association with food, denies any radiation.  She does not complain of nausea or vomiting except she had nausea and vomiting when she was taking the quadruple therapy for the first few days.  Denies having any diarrhea or constipation.  Her appetite is okay-sometimes good and sometimes she does not like to eat.  However she states she has gained 10 pounds in the last year.   She has chronic  shortness of breath and she is unsure why.  She tells me no doctor has been able to find why she has shortness of breath.  She denies being ever smoker.  Drinks alcohol occasionally and denies using any recreational drugs.  She feels like she has to pee all the time but denies any dysuria or hematuria.  She states she had penicillin allergy when she was a child that was approximately 60 years ago.  She describes it as breaking out.  Denies any facial swelling or throat swelling.  Denies any shortness of breath passing out, nausea or vomiting.  She states that she has not taken any penicillin group of antibiotics thereafter.   ROS: Constitutional: Negative for fever, chills, activity change, appetite change, fatigue and unexpected weight change.  HENT: Negative for congestion, sore throat, rhinorrhea, sneezing, trouble swallowing and sinus pressure.  Eyes: Negative for photophobia and visual disturbance.  Respiratory: Negative for cough, chest tightness, shortness of breath, wheezing and stridor.  Cardiovascular: Negative for chest pain, palpitations and leg swelling.  Gastrointestinal: Negative for nausea, vomiting, abdominal pain, diarrhea, constipation, blood in stool, abdominal distention and anal bleeding.  Genitourinary: Negative for dysuria, hematuria, flank pain and difficulty urinating.  Musculoskeletal: Negative for myalgias, back pain, joint swelling, arthralgias and gait problem.  Skin: Negative for color change, pallor, rash and wound.  Neurological: Negative for dizziness, tremors, weakness and light-headedness.  Hematological: Negative for adenopathy. Does not bruise/bleed easily.  Psychiatric/Behavioral: Negative for behavioral problems, confusion, sleep disturbance, dysphoric mood, decreased concentration and agitation.   Past Medical History:  Diagnosis Date   Allergy    Anemia    Anxiety    Arthritis    Cataract    cervical ca 1990   Depression    Diabetes ([place])     Hypercholesterolemia    Hypertension    Myocardial infarction ([place])    Sleep apnea    on CPAP 14 mm PS since February 2017   Stroke [facility])    weakness on R side   Vitamin D deficiency    Past Surgical History:  Procedure Laterality Date   ABDOMINAL HYSTERECTOMY     BARIATRIC SURGERY     BIOPSY  09/17/2020   Procedure: BIOPSY;  Surgeon: Harvel Quale, MD;  [facility];  Service: Gastroenterology;;   CARDIAC CATHETERIZATION N/A 12/24/2015   Procedure: Right/Left Heart Cath and Coronary Angiography;  Surgeon: Jettie Booze, MD;  Location: [facility];  Service: Cardiovascular;  Laterality: N/A;   COLONOSCOPY  2018   COLONOSCOPY WITH PROPOFOL N/A 09/17/2020   Procedure: COLONOSCOPY WITH PROPOFOL;  Surgeon: Harvel Quale, MD;  Location: [facility];  Service: Gastroenterology;  Laterality: N/A;  9:00   ESOPHAGOGASTRODUODENOSCOPY (EGD) WITH PROPOFOL N/A 09/17/2020   Procedure: ESOPHAGOGASTRODUODENOSCOPY (EGD) WITH PROPOFOL;  Surgeon: Harvel Quale, MD;  Location: [facility];  Service: Gastroenterology;  Laterality: N/A;   HERNIA REPAIR     LEFT HEART CATH AND CORONARY ANGIOGRAPHY N/A 07/12/2017   Procedure: LEFT HEART CATH AND CORONARY ANGIOGRAPHY;  Surgeon: Belva Crome, MD;  Location: [facility];  Service: Cardiovascular;  Laterality: N/A;   Current Outpatient Medications on File Prior to Visit  Medication Sig Dispense Refill   ACCU-CHEK AVIVA PLUS test strip Inject 1 strip as directed daily.  4   acetaminophen (TYLENOL) 500 MG tablet Take 500 mg by mouth every 6 (six) hours as needed for mild pain or headache. As needed.     aspirin 81 MG EC tablet Take 81 mg by mouth daily.     Calcium Carb-Cholecalciferol (CALCIUM 600-D PO) Take 1 tablet by mouth 2 (two) times a week.     dicyclomine (BENTYL) 10 MG capsule Take 1 capsule (10 mg total) by mouth every 8 (eight) hours as needed for spasms. 60 capsule 2   EPINEPHrine  0.3 mg/0.3 mL IJ SOAJ injection Inject 0.3 mg into the muscle as needed for anaphylaxis.     ergocalciferol (VITAMIN D2) 50000 units capsule Take 5,000 Units by mouth 2 (two) times daily.     ferrous sulfate 325 (65 FE) MG tablet Take 325 mg by mouth daily with breakfast.     glimepiride (AMARYL) 4 MG tablet Take 4 mg by mouth 2 (two) times daily.     insulin lispro (HUMALOG) 100 UNIT/ML injection Inject 10 Units into the skin 3 (three) times daily before meals. If blood glucose is over 150     Lancets Thin MISC Inject 1 Stick into the skin daily.     levothyroxine (SYNTHROID) 25 MCG tablet Take 25 mcg by mouth daily.     metFORMIN (GLUCOPHAGE) 1000 MG tablet Take 1 tablet (1,000 mg total) by mouth 2 (two) times daily.     Multiple Vitamins-Minerals (MULTIVITAMIN ADULT) CHEW Chew 2 tablets by mouth daily at 12 noon.     olmesartan-hydrochlorothiazide (BENICAR HCT) 20-12.5 MG tablet Take 1 tablet by mouth daily. 30 tablet 11   POTASSIUM PO Take 10 mg by mouth every evening.     simvastatin (ZOCOR) 20 MG tablet Take 20 mg by mouth every evening.      No current facility-administered medications on file prior to visit.   Allergies  Allergen Reactions   Corn Oil Rash   Other Rash    PT STATES SHE IS ALLERGIC TO MOST FOODS-STATES IT WAS FOUND IN ALLERGY TESTING   Peanut Allergen Powder-Dnfp Rash   Peanut-Containing Drug Products Rash   Penicillins Rash    Has patient had a PCN reaction causing immediate rash, facial/tongue/throat swelling, SOB or lightheadedness with hypotension: Yes Has patient had a PCN reaction causing severe rash involving mucus membranes or skin necrosis: No Has patient had a PCN reaction that required hospitalization No Has patient had a PCN reaction occurring within the last 10 years: No. Has not had in 40-50 years If all of the above answers are "NO", then  may proceed with Cephalosporin use.   Social History   Socioeconomic History   Marital status: Married     Spouse name: Not on file   Number of children: 2   Years of education: Not on file   Highest education level: Not on file  Occupational History   Occupation: retired  Tobacco Use   Smoking status: Never   Smokeless tobacco: Never  Vaping Use   Vaping Use: Never used  Substance and Sexual Activity   Alcohol use: Yes    Alcohol/week: 0.0 standard drinks    Comment: occasionally   Drug use: No   Sexual activity: Never    Partners: Male  Other Topics Concern   Not on file  Social History Narrative   Not on file   Social Determinants of Health   Financial Resource Strain: Not on file  Food Insecurity: Not on file  Transportation Needs: Not on file  Physical Activity: Not on file  Stress: Not on file  Social Connections: Not on file  Intimate Partner Violence: Not on file   Family H/o - Colon ca mother, son Cory Roughen; Father had thyroid ca  Vitals BP 134/81 (BP Location: Right Arm, Patient Position: Sitting, Cuff Size: Large)   Pulse 69   Temp 98.1 F (36.7 C) (Oral)   Resp 18   Ht 5' (1.524 m)   Wt 239 lb 12.8 oz (108.8 kg)   SpO2 99%   BMI 46.83 kg/m     Examination  General - not in acute distress, comfortably sitting in chair, OBESE  HEENT - PEERLA, no pallor and no icterus Chest - b/l clear air entry, no additional sounds CVS- Normal s1s2, RRR Abdomen - Soft, Non tender , LARGE ABDOMEN  Ext- no pedal edema Neuro: grossly normal Back - WNL Psych : calm and cooperative   Recent labs CBC Latest Ref Rng & Units 09/29/2020 09/15/2020 07/09/2017  WBC 4.0 - 10.5 K/uL 9.3 8.3 6.4  Hemoglobin 12.0 - 15.0 g/dL 10.9(L) 10.5(L) 11.5(L)  Hematocrit 36.0 - 46.0 % 35.2(L) 34.6(L) 38.2  Platelets 150 - 400 K/uL 275 273 315   CMP Latest Ref Rng & Units 09/15/2020 07/09/2017 12/24/2015  Glucose 70 - 99 mg/dL 172(H) 149(H) 149(H)  BUN 8 - 23 mg/dL 25(H) 18 20  Creatinine 0.44 - 1.00 mg/dL 1.66(H) 1.28(H) 1.54(H)  Sodium 135 - 145 mmol/L 139 140 139  Potassium 3.5 - 5.1  mmol/L 4.5 4.2 3.8  Chloride 98 - 111 mmol/L 107 105 105  CO2 22 - 32 mmol/L 22 21(L) 23  Calcium 8.9 - 10.3 mg/dL 9.2 9.1 9.5     Pertinent Microbiology Results for orders placed or performed during the hospital encounter of 09/15/20  SARS CORONAVIRUS 2 (TAT 6-24 HRS) Nasopharyngeal Nasopharyngeal Swab     Status: None   Collection Time: 09/15/20  9:23 AM   Specimen: Nasopharyngeal Swab  Result Value Ref Range Status   SARS Coronavirus 2 NEGATIVE NEGATIVE Final    Comment: (NOTE) SARS-CoV-2 target nucleic acids are NOT DETECTED.  The SARS-CoV-2 RNA is generally detectable in upper and lower respiratory specimens during the acute phase of infection. Negative results do not preclude SARS-CoV-2 infection, do not rule out co-infections with other pathogens, and should not be used as the sole basis for treatment or other patient management decisions. Negative results must be combined with clinical observations, patient history, and epidemiological information. The expected result is Negative.  Fact Sheet for Patients: SugarRoll.be  Fact Sheet for Healthcare Providers:  https://www.woods-mathews.com/  This test is not yet approved or cleared by the [place] and  has been authorized for detection and/or diagnosis of SARS-CoV-2 by FDA under an Emergency Use Authorization (EUA). This EUA will remain  in effect (meaning this test can be used) for the duration of the COVID-19 declaration under Se ction 564(b)(1) of the Act, 21 U.S.C. section 360bbb-3(b)(1), unless the authorization is terminated or revoked sooner.  Performed at [facility], [place] [address] [place], [place] [address]     Pertinent Pathology 09/17/20 FINAL MICROSCOPIC DIAGNOSIS:   A. DUODENUM, BIOPSY:  - Duodenal mucosa with no significant pathologic findings.  - Negative for increased intraepithelial lymphocytes and villous  architectural changes.   B.  STOMACH, BIOPSY:  - Chronic active gastritis with Helicobacter pylori [[place]ry  stain performed].   C. COLON, ASCENDING, POLYPECTOMY:  - Early/evolving tubular adenoma.  - Negative for high-grade dysplasia.   Pertinent Imaging 09/17/20 Colonoscopy   - A single non-bleeding colonic angiodysplastic lesion. - A single non-bleeding colonic angiodysplastic lesion. - One 2 mm polyp in the ascending colon, removed with a cold biopsy forceps. Resected and retrieved. - Diverticulosis in the sigmoid colon and in the descending colon. - Non-bleeding internal hemorrhoids.  09/17/20 Upper GI Endoscopy   Findings: Diffuse atrophic mucosa was found in the gastric antrum. Biopsies were taken from body and antrum with a cold forceps for Helicobacter pylori testing. There was no presence of inflammation. Of note, there was no evidence of any previous gastric/bariatric surgery. The examined duodenum was normal. Biopsies were taken with a cold forceps for histology. - Normal esophagus. - Gastric mucosal atrophy. Biopsied. - Normal examined duodenum. Biopsied.  All pertinent labs/Imagings/notes reviewed. All pertinent plain films and CT images have been personally visualized and interpreted; radiology reports have been reviewed. Decision making incorporated into the Impression / Recommendations.  I have spent 60 minutes for this patient encounter including  review of prior medical records with greater than 50% of time in face to face counsel of the patient/discussing diagnostics and plan of care.   Electronically signed by:  Rosiland Oz, MD Infectious Disease Physician [facility] [address] [place], [place] [address] Phone: [phone]  Fax: [phone]

## 2020-10-13 NOTE — Telephone Encounter (Signed)
Received call from [place] wanting to clarify prescriptions for clarithromycin and metronidazole. Per note and medication orders, patient is to be on clarithromycin 500mg  BID dispense 30 tablet and metronidazole 500mg  three times daily dispense 45 tablets. Pharmacist Jeani Hawking verbalized understanding.    Beryle Flock, RN

## 2020-11-05 ENCOUNTER — Other Ambulatory Visit: Payer: Self-pay

## 2020-11-05 ENCOUNTER — Encounter: Payer: Self-pay | Admitting: Infectious Diseases

## 2020-11-05 ENCOUNTER — Ambulatory Visit: Payer: Medicare Other | Admitting: Infectious Diseases

## 2020-11-05 VITALS — BP 140/84 | HR 81 | Temp 97.7°F | Wt 237.6 lb

## 2020-11-05 DIAGNOSIS — K2970 Gastritis, unspecified, without bleeding: Secondary | ICD-10-CM | POA: Diagnosis not present

## 2020-11-05 DIAGNOSIS — B9681 Helicobacter pylori [H. pylori] as the cause of diseases classified elsewhere: Secondary | ICD-10-CM

## 2020-11-05 DIAGNOSIS — B353 Tinea pedis: Secondary | ICD-10-CM | POA: Diagnosis not present

## 2020-11-05 DIAGNOSIS — Z880 Allergy status to penicillin: Secondary | ICD-10-CM

## 2020-11-05 NOTE — Progress Notes (Signed)
[facility]                                                             [place], [place], [place], [address]                                                                  Phn. 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 Patient verbalized understanding of the plan. ____________________________________________________________________________________________________________________  HPI:  75 year old female with past medical history of obesity status post Roux-en-Y gastric bypass, anxiety, depression, DM, HLD, HTN, MI, CVA who is referred from GI for evaluation and treatment of H. pylori infection. 75 year old female with past medical history of obesity status post Roux-en-Y gastric bypass, anxiety, depression, DM, HLD, HTN, MI, CVA who is referred from GI for evaluation and treatment of H. pylori infection.  Patient was seen in the GI office on 4/28 for evaluation of abdominal pain.  She underwent EGD and  colonoscopy on 5/20 with findings mentioned in the imaging section.  Path was positive for chronic active gastritis with positive H. Pylori.  She was started on quadruple therapy but was unable to tolerate.  She started nausea and vomiting after taking the treatment for 2 to 3 days.  She is unsure if any or all of them is related to her sickness.  She was recommended triple therapy with clarithromycin but that had to be discontinued in the setting of penicillin allergy.  She denies ever being treated to H. pylori in the past and this is the first time she was told that she had H. pylori infection.  Patient states that she has been having abdominal pain for 2 years approximately, reason she was seeing a gastroenterologist.  She describes the abdominal pain at the upper abdomen, intermittent in nature, intensity is 4-5 out of 10, no aggravating or relieving factors, denies any association with food, denies any radiation.  She does not complain of nausea or vomiting except she had nausea and vomiting when she was taking the quadruple therapy for the first few days.  Denies having any diarrhea or constipation.  Her appetite is okay-sometimes good and sometimes she does not like to eat.  However she states she has gained 10 pounds in the last year.   She has chronic shortness of breath and she is unsure why.  She tells me no doctor has been able to find why she has shortness of breath.  She denies being ever smoker.  Drinks alcohol occasionally and denies using any recreational drugs.  She feels like she has to pee all the time but denies any dysuria or hematuria.  She states she had penicillin allergy when she was a child that was approximately 60 years ago.  She describes it as breaking out.  Denies any facial swelling or throat swelling.  Denies any shortness of breath passing out, nausea or vomiting.  She states that she has not taken any penicillin group of antibiotics thereafter.   11/05/20 Here for follow  up for H pylori infection. She is accompanied by her husband. Completed antibiotics as prescribed for H pylori infection. Denies missing any doses. Denies any concerning sideeffects other than her blood glucose are running in the higher range. She has an appointment with allergist in August and instructed her to keep that appointment. She had questions about how does she know she has been cured from H pylori infection. Discussed about testing for cure around 4-6 weeks after completion of tx. Instructed her to switch back simvastatin to atorvastatin now she has completed tx for H pylori.   ROS: Negative for fever, chills, activity change, appetite change, fatigue and unexpected weight change.  Negative for cough, chest pain and SOB Negative for GU symptoms.    Past Medical History:  Diagnosis Date   Allergy    Anemia    Anxiety    Arthritis    Cataract    cervical ca 1990   Depression    Diabetes ([place])    Hypercholesterolemia    Hypertension    Myocardial infarction ([place])    Sleep apnea    on CPAP 14 mm PS since February 2017   Stroke [facility])    weakness on R side   Vitamin D deficiency    Past Surgical History:  Procedure Laterality Date   ABDOMINAL HYSTERECTOMY     BARIATRIC SURGERY     BIOPSY  09/17/2020   Procedure: BIOPSY;  Surgeon: Harvel Quale, MD;  [facility];  Service: Gastroenterology;;   CARDIAC CATHETERIZATION N/A 12/24/2015   Procedure: Right/Left Heart Cath and Coronary Angiography;  Surgeon: Jettie Booze, MD;  Location: [facility];  Service: Cardiovascular;  Laterality: N/A;   COLONOSCOPY  2018   COLONOSCOPY WITH PROPOFOL N/A 09/17/2020   Procedure: COLONOSCOPY WITH PROPOFOL;  Surgeon: Harvel Quale, MD;  [facility];  Service: Gastroenterology;  Laterality: N/A;  9:00   ESOPHAGOGASTRODUODENOSCOPY (EGD) WITH PROPOFOL N/A 09/17/2020   Procedure: ESOPHAGOGASTRODUODENOSCOPY (EGD) WITH PROPOFOL;  Surgeon:  Harvel Quale, MD;  Location[facility];  Service: Gastroenterology;  Laterality: N/A;   HERNIA REPAIR     LEFT HEART CATH AND CORONARY ANGIOGRAPHY N/A 07/12/2017   Procedure:  LEFT HEART CATH AND CORONARY ANGIOGRAPHY;  Surgeon: Belva Crome, MD;  Location: [facility];  Service: Cardiovascular;  Laterality: N/A;    Current Outpatient Medications on File Prior to Visit  Medication Sig Dispense Refill   ACCU-CHEK AVIVA PLUS test strip Inject 1 strip as directed daily.  4   acetaminophen (TYLENOL) 500 MG tablet Take 500 mg by mouth every 6 (six) hours as needed for mild pain or headache. As needed.     aspirin 81 MG EC tablet Take 81 mg by mouth daily.     Calcium Carb-Cholecalciferol (CALCIUM 600-D PO) Take 1 tablet by mouth 2 (two) times a week.     dicyclomine (BENTYL) 10 MG capsule Take 1 capsule (10 mg total) by mouth every 8 (eight) hours as needed for spasms. 60 capsule 2   EPINEPHrine 0.3 mg/0.3 mL IJ SOAJ injection Inject 0.3 mg into the muscle as needed for anaphylaxis.     ergocalciferol (VITAMIN D2) 50000 units capsule Take 5,000 Units by mouth 2 (two) times daily.     ferrous sulfate 325 (65 FE) MG tablet Take 325 mg by mouth daily with breakfast.     glimepiride (AMARYL) 4 MG tablet Take 4 mg by mouth 2 (two) times daily.     insulin lispro (HUMALOG) 100 UNIT/ML injection Inject 10 Units into the skin 3 (three) times daily before meals. If blood glucose is over 150     Lancets Thin MISC Inject 1 Stick into the skin daily.     levothyroxine (SYNTHROID) 25 MCG tablet Take 25 mcg by mouth daily.     metFORMIN (GLUCOPHAGE) 1000 MG tablet Take 1 tablet (1,000 mg total) by mouth 2 (two) times daily.     Multiple Vitamins-Minerals (MULTIVITAMIN ADULT) CHEW Chew 2 tablets by mouth daily at 12 noon.     olmesartan-hydrochlorothiazide (BENICAR HCT) 20-12.5 MG tablet Take 1 tablet by mouth daily. 30 tablet 11   POTASSIUM PO Take 10 mg by mouth every evening.     No  current facility-administered medications on file prior to visit.   Allergies  Allergen Reactions   Corn Oil Rash   Other Rash    PT STATES SHE IS ALLERGIC TO MOST FOODS-STATES IT WAS FOUND IN ALLERGY TESTING   Peanut Allergen Powder-Dnfp Rash   Peanut-Containing Drug Products Rash   Penicillins Rash    Has patient had a PCN reaction causing immediate rash, facial/tongue/throat swelling, SOB or lightheadedness with hypotension: Yes Has patient had a PCN reaction causing severe rash involving mucus membranes or skin necrosis: No Has patient had a PCN reaction that required hospitalization No Has patient had a PCN reaction occurring within the last 10 years: No. Has not had in 40-50 years If all of the above answers are "NO", then may proceed with Cephalosporin use.   Social History   Socioeconomic History   Marital status: Married    Spouse name: Not on file   Number of children: 2   Years of education: Not on file   Highest education level: Not on file  Occupational History   Occupation: retired  Tobacco Use   Smoking status: Never   Smokeless tobacco: Never  Vaping Use   Vaping Use: Never used  Substance and Sexual Activity   Alcohol use: Yes    Alcohol/week: 0.0 standard drinks    Comment: occasionally   Drug use: No   Sexual activity: Never    Partners: Male  Other Topics Concern   Not  on file  Social History Narrative   Not on file   Social Determinants of Health   Financial Resource Strain: Not on file  Food Insecurity: Not on file  Transportation Needs: Not on file  Physical Activity: Not on file  Stress: Not on file  Social Connections: Not on file  Intimate Partner Violence: Not on file   Family H/o - Colon ca mother, son Cory Roughen; Father had thyroid ca  Vitals Pulse 81   Temp 97.7 F (36.5 C) (Oral)   Wt 237 lb 9.6 oz (107.8 kg)   SpO2 98%   BMI 46.40 kg/m    Examination  General - not in acute distress, comfortably sitting in chair, OBESE   HEENT - PEERLA, no pallor and no icterus Chest - b/l clear air entry, no additional sounds CVS- Normal s1s2, RRR Abdomen - Soft, Non tender , LARGE ABDOMEN  Ext- no pedal edema Neuro: grossly normal Back - WNL Psych : calm and cooperative   Recent labs CBC Latest Ref Rng & Units 09/29/2020 09/15/2020 07/09/2017  WBC 4.0 - 10.5 K/uL 9.3 8.3 6.4  Hemoglobin 12.0 - 15.0 g/dL 10.9(L) 10.5(L) 11.5(L)  Hematocrit 36.0 - 46.0 % 35.2(L) 34.6(L) 38.2  Platelets 150 - 400 K/uL 275 273 315   CMP Latest Ref Rng & Units 09/15/2020 07/09/2017 12/24/2015  Glucose 70 - 99 mg/dL 172(H) 149(H) 149(H)  BUN 8 - 23 mg/dL 25(H) 18 20  Creatinine 0.44 - 1.00 mg/dL 1.66(H) 1.28(H) 1.54(H)  Sodium 135 - 145 mmol/L 139 140 139  Potassium 3.5 - 5.1 mmol/L 4.5 4.2 3.8  Chloride 98 - 111 mmol/L 107 105 105  CO2 22 - 32 mmol/L 22 21(L) 23  Calcium 8.9 - 10.3 mg/dL 9.2 9.1 9.5     Pertinent Microbiology Results for orders placed or performed during the hospital encounter of 09/15/20  SARS CORONAVIRUS 2 (TAT 6-24 HRS) Nasopharyngeal Nasopharyngeal Swab     Status: None   Collection Time: 09/15/20  9:23 AM   Specimen: Nasopharyngeal Swab  Result Value Ref Range Status   SARS Coronavirus 2 NEGATIVE NEGATIVE Final    Comment: (NOTE) SARS-CoV-2 target nucleic acids are NOT DETECTED.  The SARS-CoV-2 RNA is generally detectable in upper and lower respiratory specimens during the acute phase of infection. Negative results do not preclude SARS-CoV-2 infection, do not rule out co-infections with other pathogens, and should not be used as the sole basis for treatment or other patient management decisions. Negative results must be combined with clinical observations, patient history, and epidemiological information. The expected result is Negative.  Fact Sheet for Patients: SugarRoll.be  Fact Sheet for Healthcare Providers: https://www.woods-mathews.com/  This test is  not yet approved or cleared by the [place] FDA and  has been authorized for detection and/or diagnosis of SARS-CoV-2 by FDA under an Emergency Use Authorization (EUA). This EUA will remain  in effect (meaning this test can be used) for the duration of the COVID-19 declaration under Se ction 564(b)(1) of the Act, 21 U.S.C. section 360bbb-3(b)(1), unless the authorization is terminated or revoked sooner.  Performed at [facility], [place] [address] [place], [place] [address]     Pertinent Pathology 09/17/20 FINAL MICROSCOPIC DIAGNOSIS:   A. DUODENUM, BIOPSY:  - Duodenal mucosa with no significant pathologic findings.  - Negative for increased intraepithelial lymphocytes and villous  architectural changes.   B. STOMACH, BIOPSY:  - Chronic active gastritis with Helicobacter pylori [[place]Starry  stain performed].   C. COLON, ASCENDING, POLYPECTOMY:  -  Early/evolving tubular adenoma.  - Negative for high-grade dysplasia.   Pertinent Imaging 09/17/20 Colonoscopy   - A single non-bleeding colonic angiodysplastic lesion. - A single non-bleeding colonic angiodysplastic lesion. - One 2 mm polyp in the ascending colon, removed with a cold biopsy forceps. Resected and retrieved. - Diverticulosis in the sigmoid colon and in the descending colon. - Non-bleeding internal hemorrhoids.  09/17/20 Upper GI Endoscopy   Findings: Diffuse atrophic mucosa was found in the gastric antrum. Biopsies were taken from body and antrum with a cold forceps for Helicobacter pylori testing. There was no presence of inflammation. Of note, there was no evidence of any previous gastric/bariatric surgery. The examined duodenum was normal. Biopsies were taken with a cold forceps for histology. - Normal esophagus. - Gastric mucosal atrophy. Biopsied. - Normal examined duodenum. Biopsied.  All pertinent labs/Imagings/notes reviewed. All pertinent plain films and CT images have been personally  visualized and interpreted; radiology reports have been reviewed. Decision making incorporated into the Impression / Recommendations.  I have spent 35 minutes for this patient encounter including  review of prior medical records with greater than 50% of time in face to face counsel of the patient/discussing diagnostics and plan of care.   Electronically signed by:  Rosiland Oz, MD Infectious Disease Physician [facility] [address] [place], [place] [address] Phone: [phone]  Fax: [phone]

## 2020-11-06 MED ORDER — TERBINAFINE HCL 1 % EX CREA: 1.0000 "application " | TOPICAL_CREAM | Freq: Two times a day (BID) | CUTANEOUS | 2 refills | Status: DC

## 2020-11-10 ENCOUNTER — Other Ambulatory Visit: Payer: Self-pay

## 2020-11-10 ENCOUNTER — Ambulatory Visit (HOSPITAL_COMMUNITY)
Admission: RE | Admit: 2020-11-10 | Discharge: 2020-11-10 | Disposition: A | Discharge status: Home / Self Care | Payer: Medicare Other | Source: Ambulatory Visit | Attending: Internal Medicine | Admitting: Internal Medicine

## 2020-11-10 ENCOUNTER — Ambulatory Visit: Payer: Medicare Other | Admitting: Internal Medicine

## 2020-11-10 ENCOUNTER — Encounter: Payer: Self-pay | Admitting: Internal Medicine

## 2020-11-10 DIAGNOSIS — I10 Essential (primary) hypertension: Secondary | ICD-10-CM | POA: Diagnosis not present

## 2020-11-10 DIAGNOSIS — R0600 Dyspnea, unspecified: Secondary | ICD-10-CM | POA: Diagnosis not present

## 2020-11-10 DIAGNOSIS — R0609 Other forms of dyspnea: Secondary | ICD-10-CM

## 2020-11-10 NOTE — Progress Notes (Signed)
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     Wt was around 214 when last could walk fine per husband but in 10/2015 wt 220 c/o sob "with any exertion" with pfts wnl    History of Present Illness  09/29/2020  Pulmonary/ 1st office eval/ Suetta Hoffmeister / [facility]  Chief Complaint  Patient presents with   Pulmonary Consult    Referred by Dr Catalina Lunger. Pt c/o DOE for 2 years, progressively worsening to the point she is winded walking room to room.   Dyspnea:  Stopped working  2007 at same wt as is now, not limited by breathing. By 2017 was being eval for sob with nl pfts performed on 12/10/15. Last walked streets in front of her house  x 3-4 years ago Last grocery store [employer] pushing basket one week prior to OV    Cough: dry hs  Sleep: bed is flat one pillow / some noct choking on cpap - Rx  per [facility]  SABA use: none  Rec  Stop lisinopril and start olmesartan 20/12.5 in its place Omeprazole 40 mg   Take  30-60 min before first meal of the day  Labs ok  / did not go for cxr   11/10/2020  f/u ov/[place] office/Darlinda Bellows re: ? Acei case f/u / needs cxr  Chief Complaint  Patient presents with   Follow-up    Breathing is unchanged since the last visit. No new co's.    Dyspnea:  no change doe room to room  Cough: better / no choking office   Sleeping: fine  now  new / flat bed  SABA use: none  02: none  Covid status:  vax x 4  Lung cancer screening: never smoker  Worse leg swelling x  6 months    No obvious day to day or daytime variability or assoc excess/ purulent sputum or mucus plugs or hemoptysis or cp or chest tightness, subjective wheeze or overt sinus or  hb symptoms.   Sleeping flat now  without nocturnal  or early am exacerbation  of respiratory  c/o's or need for noct saba. Also denies any obvious fluctuation of symptoms with weather or environmental changes or other aggravating or alleviating factors except as outlined above   No unusual exposure hx or h/o childhood pna/ asthma or knowledge of premature birth.  Current Allergies, Complete Past Medical History, Past Surgical History, Family History, and Social History were reviewed in [employer] record.  ROS  The following are not active complaints unless bolded Hoarseness, sore throat, dysphagia, dental problems, itching, sneezing,  nasal congestion or discharge of excess mucus or purulent secretions, ear ache,   fever, chills, sweats, unintended wt loss or wt gain, classically pleuritic or exertional cp,  orthopnea pnd or arm/hand swelling  or leg swelling, presyncope, palpitations, abdominal pain, anorexia, nausea, vomiting, diarrhea  or change in bowel habits or change in bladder habits, change in stools or change in urine, dysuria, hematuria,  rash, arthralgias, visual complaints, headache, numbness, weakness or ataxia or problems with walking or coordination,  change in  mood or  memory.        Current Meds  Medication Sig   ACCU-CHEK AVIVA PLUS test strip Inject 1 strip as directed daily.   acetaminophen (TYLENOL) 500 MG tablet Take 500 mg by mouth every 6 (six) hours as needed for mild pain or headache. As needed.   aspirin 81 MG EC tablet Take 81 mg by mouth daily.   Calcium Carb-Cholecalciferol (CALCIUM 600-D PO) Take 1 tablet by mouth 2 (two) times a week.   dicyclomine (BENTYL) 10 MG capsule Take 1 capsule (10 mg total) by mouth every 8 (eight) hours as needed for spasms.   EPINEPHrine 0.3 mg/0.3 mL IJ SOAJ injection Inject 0.3 mg into the muscle as needed for anaphylaxis.   ergocalciferol (VITAMIN D2) 50000 units capsule Take 5,000 Units by mouth 2 (two)  times daily.   ferrous sulfate 325 (65 FE) MG tablet Take 325 mg by mouth daily with breakfast.   glimepiride (AMARYL) 4 MG tablet Take 4 mg by mouth 2 (two) times daily.   insulin lispro (HUMALOG) 100 UNIT/ML injection Inject 10 Units into the skin 3 (three) times daily before meals. If blood glucose is over 150   Lancets Thin MISC Inject 1 Stick into the skin daily.   levothyroxine (SYNTHROID) 25 MCG tablet Take 25 mcg by mouth daily.   metFORMIN (GLUCOPHAGE) 1000 MG tablet Take 1 tablet (1,000 mg total) by mouth 2 (two) times daily.   Multiple Vitamins-Minerals (MULTIVITAMIN ADULT) CHEW Chew 2 tablets by mouth daily at 12 noon.   olmesartan-hydrochlorothiazide (BENICAR HCT) 20-12.5 MG tablet Take 1 tablet by mouth daily.   POTASSIUM PO Take 10 mg by mouth every evening.   terbinafine (LAMISIL AT) 1 % cream Apply 1 application topically 2 (two) times daily.             Past Medical History:  Diagnosis Date   Allergy    Anemia    Anxiety    Arthritis    Cataract    cervical ca 1990   Depression    Diabetes ([place])    Hypercholesterolemia    Hypertension    Myocardial infarction [facility])    Sleep apnea    on CPAP 14 mm PS since February 2017   Stroke [facility])    weakness on R side   Vitamin D deficiency      Objective:     Wt Readings from Last 3 Encounters:  11/10/20 238 lb (108 kg)  11/05/20 237 lb 9.6 oz (107.8 kg)  10/13/20 239 lb 12.8 oz (108.8 kg)      Vital signs reviewed  11/10/2020  - Note at rest 02 sats  99% on RA   General appearance:    somber MO bf nad at rest, struggles to get out of chair and needs assistance to get up on step to exam table, moving very slowly     HEENT : pt wearing mask not removed for exam due to covid -19 concerns.    NECK :  without JVD/Nodes/TM/ nl carotid upstrokes bilaterally   LUNGS: no acc muscle use,  Nl contour chest which is clear to A and P bilaterally without cough on insp or exp maneuvers   CV:  RRR  no s3 or murmur or  increase in P2, and no edema   ABD: massively obedse soft and nontender with nl inspiratory excursion in the supine position. No bruits or organomegaly appreciated, bowel sounds nl  MS:  Nl gait/ ext warm without deformities,  calf tenderness, cyanosis or clubbing No obvious joint restrictions   SKIN: warm and dry without lesions    NEURO:  alert, approp, nl sensorium with  no motor or cerebellar deficits apparent.         I personally reviewed images / impression as follows:  CXR:   kyphosis/ cm s acute changes         Assessment

## 2020-11-10 NOTE — Patient Instructions (Addendum)
I am going to let Dr Woody Seller work with you on the fluid and the weight problem     Please remember to go to the  x-ray department  @  [facility] for your tests - we will call you with the results when they are available      If you are satisfied with your blood pressure,  let your doctor know and he/she can either refill your medications or you can return here when your prescription runs out but we can't refill it without seeing you.       Pulmonary follow up is as needed

## 2020-11-11 ENCOUNTER — Encounter: Payer: Self-pay | Admitting: Internal Medicine

## 2020-11-11 NOTE — Assessment & Plan Note (Signed)
D/c acei 09/29/2020 noct choking/ unexplained doe > choking resolved but not doe as of 11/10/2020   Although even in retrospect it may not be clear the ACEi contributed to the pt's symptoms, choking sensation improved off them and adding them back at this point or in the future would risk confusion in interpretation of non-specific respiratory symptoms to which this patient is prone  ie  Better not to muddy the waters here.   >>> f/u per PCP  Each maintenance medication was reviewed in detail including emphasizing most importantly the difference between maintenance and prns and under what circumstances the prns are to be triggered using an action plan format where appropriate.  Total time for H and P, chart review, counseling,  directly observing portions of ambulatory 02 saturation study/ and generating customized AVS unique to this office visit / same day charting  > 30 min

## 2020-11-11 NOTE — Assessment & Plan Note (Signed)
Onset around 2017 -  nl pfts  12/10/15 @ wt 220 "sob with any exertion" per hx on pft  - try off acei and on gerd max rx 09/29/2020  - 11/10/2020 at wt 238    Walked RA  approx   200  ft  @ moderate pace  stopped due to  Sob with sats  99% at end  No evidence of any pulmonary dz other than small lung volumes from kyphosis and obesity.  She may have an element of diastolic dysfunction with tendency to leg swelling but defer rx of this and needed wt loss to PCP

## 2020-11-11 NOTE — Assessment & Plan Note (Signed)
Body mass index is 46.48 kg/m.  -  trending no change  Lab Results  Component Value Date   TSH 1.533 09/29/2020     Contributing to gerd risk/ doe/reviewed the need and the process to achieve and maintain neg calorie balance > defer f/u primary care including intermittently monitoring thyroid status

## 2020-12-20 ENCOUNTER — Other Ambulatory Visit: Payer: Self-pay

## 2020-12-20 ENCOUNTER — Ambulatory Visit: Payer: Medicare Other | Admitting: Infectious Diseases

## 2020-12-20 VITALS — BP 127/82 | HR 71 | Temp 98.0°F | Resp 16 | Ht 60.0 in | Wt 238.0 lb

## 2020-12-20 DIAGNOSIS — K2970 Gastritis, unspecified, without bleeding: Secondary | ICD-10-CM | POA: Diagnosis not present

## 2020-12-20 DIAGNOSIS — B9681 Helicobacter pylori [H. pylori] as the cause of diseases classified elsewhere: Secondary | ICD-10-CM | POA: Diagnosis not present

## 2020-12-20 DIAGNOSIS — B353 Tinea pedis: Secondary | ICD-10-CM

## 2020-12-20 NOTE — Progress Notes (Signed)
[facility]                                                             [place], [place], [place], [address]                                                                  Phn. 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 She is almost 6 weeks out of tx for H pylori  Fu with allergy for penicillin allergy testing Lamisill cream for Tinea pedis FU will be made pending  H pylori stool antigen test results   All questions and concerns were discussed and addressed. Patient verbalized understanding of the plan. ____________________________________________________________________________________________________________________  HPI:  75 year old female with past medical history of obesity status post Roux-en-Y gastric bypass, anxiety, depression, DM, HLD, HTN, MI, CVA who is referred from GI for evaluation and treatment of H. pylori infection. 75 year old female with past medical history of obesity status post Roux-en-Y gastric bypass, anxiety, depression, DM, HLD, HTN, MI, CVA who is referred from GI for evaluation and treatment of H. pylori infection.  Patient was seen in the GI office on 4/28 for evaluation of abdominal pain.  She underwent EGD and colonoscopy on 5/20 with findings  mentioned in the imaging section.  Path was positive for chronic active gastritis with positive H. Pylori.  She was started on quadruple therapy but was unable to tolerate.  She started nausea and vomiting after taking the treatment for 2 to 3 days.  She is unsure if any or all of them is related to her sickness.  She was recommended triple therapy with clarithromycin but that had to be discontinued in the setting of penicillin allergy.  She denies ever being treated to H. pylori in the past and this is the first time she was told that she had H. pylori infection.  Patient states that she has been having abdominal pain for 2 years approximately, reason she was seeing a gastroenterologist.  She describes the abdominal pain at  the upper abdomen, intermittent in nature, intensity is 4-5 out of 10, no aggravating or relieving factors, denies any association with food, denies any radiation.  She does not complain of nausea or vomiting except she had nausea and vomiting when she was taking the quadruple therapy for the first few days.  Denies having any diarrhea or constipation.  Her appetite is okay-sometimes good and sometimes she does not like to eat.  However she states she has gained 10 pounds in the last year.   She has chronic shortness of breath and she is unsure why.  She tells me no doctor has been able to find why she has shortness of breath.  She denies being ever smoker.  Drinks alcohol occasionally and denies using any recreational drugs.  She feels like she has to pee all the time but denies any dysuria or hematuria.  She states she had penicillin allergy when she was a child that was approximately 60 years ago.  She describes it as breaking out.  Denies any facial swelling or throat swelling.  Denies any shortness of breath passing out, nausea or vomiting.  She states that she has not taken any penicillin group of antibiotics thereafter.   11/05/20 Here for follow up for H pylori infection. She is  accompanied by her husband. Completed antibiotics as prescribed for H pylori infection. Denies missing any doses. Denies any concerning sideeffects other than her blood glucose are running in the higher range. She has an appointment with allergist in August and instructed her to keep that appointment. She had questions about how does she know she has been cured from H pylori infection. Discussed about testing for cure around 4-6 weeks after completion of tx. Instructed her to switch back simvastatin to atorvastatin now she has completed tx for H pylori.   12/20/20 Here for follow up for H pylori Infection. Patient is accompanied by her husband. Says she feels better. Denies any nausea, vomiting, abdominal upset/discomfort or pain, occasionally has loose stools. She is also following up with her PCP and Pulmonary. She is going to follow up with the Allergist on Wednesday for penicillin skin testing. Discussed about testing H pylori ag in her stool as a test of eradication to which she agreed. Will give her sample to collect stool and submit it to the lab.   ROS: Negative for fever, chills, activity change, appetite change, fatigue and unexpected weight change.  Negative for cough, chest pain and SOB Negative for GU symptoms.    Past Medical History:  Diagnosis Date   Allergy    Anemia    Anxiety    Arthritis    Cataract    cervical ca 1990   Depression    Diabetes ([place])    Hypercholesterolemia    Hypertension    Myocardial infarction ([place])    Sleep apnea    on CPAP 14 mm PS since February 2017   Stroke [facility])    weakness on R side   Vitamin D deficiency    Past Surgical History:  Procedure Laterality Date   ABDOMINAL HYSTERECTOMY     BARIATRIC SURGERY     BIOPSY  09/17/2020   Procedure: BIOPSY;  Surgeon: Harvel Quale, MD;  [facility];  Service: Gastroenterology;;   CARDIAC CATHETERIZATION N/A 12/24/2015   Procedure: Right/Left Heart Cath and Coronary  Angiography;  Surgeon: Jettie Booze, MD;  Location: [facility];  Service: Cardiovascular;  Laterality: N/A;   COLONOSCOPY  2018  COLONOSCOPY WITH PROPOFOL N/A 09/17/2020   Procedure: COLONOSCOPY WITH PROPOFOL;  Surgeon: Harvel Quale, MD;  [facility];  Service: Gastroenterology;  Laterality: N/A;  9:00   ESOPHAGOGASTRODUODENOSCOPY (EGD) WITH PROPOFOL N/A 09/17/2020   Procedure: ESOPHAGOGASTRODUODENOSCOPY (EGD) WITH PROPOFOL;  Surgeon: Harvel Quale, MD;  Location: [facility];  Service: Gastroenterology;  Laterality: N/A;   HERNIA REPAIR     LEFT HEART CATH AND CORONARY ANGIOGRAPHY N/A 07/12/2017   Procedure: LEFT HEART CATH AND CORONARY ANGIOGRAPHY;  Surgeon: Belva Crome, MD;  Location: [facility];  Service: Cardiovascular;  Laterality: N/A;    Current Outpatient Medications on File Prior to Visit  Medication Sig Dispense Refill   ACCU-CHEK AVIVA PLUS test strip Inject 1 strip as directed daily.  4   acetaminophen (TYLENOL) 500 MG tablet Take 500 mg by mouth every 6 (six) hours as needed for mild pain or headache. As needed.     aspirin 81 MG EC tablet Take 81 mg by mouth daily.     Calcium Carb-Cholecalciferol (CALCIUM 600-D PO) Take 1 tablet by mouth 2 (two) times a week.     dicyclomine (BENTYL) 10 MG capsule Take 1 capsule (10 mg total) by mouth every 8 (eight) hours as needed for spasms. 60 capsule 2   EPINEPHrine 0.3 mg/0.3 mL IJ SOAJ injection Inject 0.3 mg into the muscle as needed for anaphylaxis.     ergocalciferol (VITAMIN D2) 50000 units capsule Take 5,000 Units by mouth 2 (two) times daily.     ferrous sulfate 325 (65 FE) MG tablet Take 325 mg by mouth daily with breakfast.     glimepiride (AMARYL) 4 MG tablet Take 4 mg by mouth 2 (two) times daily.     insulin lispro (HUMALOG) 100 UNIT/ML injection Inject 10 Units into the skin 3 (three) times daily before meals. If blood glucose is over 150     Lancets Thin MISC  Inject 1 Stick into the skin daily.     levothyroxine (SYNTHROID) 25 MCG tablet Take 25 mcg by mouth daily.     metFORMIN (GLUCOPHAGE) 1000 MG tablet Take 1 tablet (1,000 mg total) by mouth 2 (two) times daily.     Multiple Vitamins-Minerals (MULTIVITAMIN ADULT) CHEW Chew 2 tablets by mouth daily at 12 noon.     olmesartan-hydrochlorothiazide (BENICAR HCT) 20-12.5 MG tablet Take 1 tablet by mouth daily. 30 tablet 11   POTASSIUM PO Take 10 mg by mouth every evening.     terbinafine (LAMISIL AT) 1 % cream Apply 1 application topically 2 (two) times daily. 30 g 2   No current facility-administered medications on file prior to visit.   Allergies  Allergen Reactions   Corn Oil Rash   Other Rash    PT STATES SHE IS ALLERGIC TO MOST FOODS-STATES IT WAS FOUND IN ALLERGY TESTING   Peanut Allergen Powder-Dnfp Rash   Peanut-Containing Drug Products Rash   Penicillins Rash    Has patient had a PCN reaction causing immediate rash, facial/tongue/throat swelling, SOB or lightheadedness with hypotension: Yes Has patient had a PCN reaction causing severe rash involving mucus membranes or skin necrosis: No Has patient had a PCN reaction that required hospitalization No Has patient had a PCN reaction occurring within the last 10 years: No. Has not had in 40-50 years If all of the above answers are "NO", then may proceed with Cephalosporin use.   Social History   Socioeconomic History   Marital status: Married  Spouse name: Not on file   Number of children: 2   Years of education: Not on file   Highest education level: Not on file  Occupational History   Occupation: retired  Tobacco Use   Smoking status: Never   Smokeless tobacco: Never  Vaping Use   Vaping Use: Never used  Substance and Sexual Activity   Alcohol use: Yes    Alcohol/week: 0.0 standard drinks    Comment: occasionally   Drug use: No   Sexual activity: Never    Partners: Male  Other Topics Concern   Not on file  Social  History Narrative   Not on file   Social Determinants of Health   Financial Resource Strain: Not on file  Food Insecurity: Not on file  Transportation Needs: Not on file  Physical Activity: Not on file  Stress: Not on file  Social Connections: Not on file  Intimate Partner Violence: Not on file   Family H/o - Colon ca mother, son Cory Roughen; Father had thyroid ca  Vitals BP 127/82   Pulse 71   Temp 98 F (36.7 C)   Resp 16   Ht 5' (1.524 m)   Wt 238 lb (108 kg)   SpO2 100%   BMI 46.48 kg/m     Examination  General - not in acute distress, comfortably sitting in chair, OBESE  HEENT - PEERLA, no pallor and no icterus Chest - b/l clear air entry, no additional sounds CVS- Normal s1s2, RRR Abdomen - Soft, Non tender , LARGE ABDOMEN  Ext- no pedal edema Neuro: grossly normal Back - WNL Psych : calm and cooperative   Recent labs CBC Latest Ref Rng & Units 09/29/2020 09/15/2020 07/09/2017  WBC 4.0 - 10.5 K/uL 9.3 8.3 6.4  Hemoglobin 12.0 - 15.0 g/dL 10.9(L) 10.5(L) 11.5(L)  Hematocrit 36.0 - 46.0 % 35.2(L) 34.6(L) 38.2  Platelets 150 - 400 K/uL 275 273 315   CMP Latest Ref Rng & Units 09/15/2020 07/09/2017 12/24/2015  Glucose 70 - 99 mg/dL 172(H) 149(H) 149(H)  BUN 8 - 23 mg/dL 25(H) 18 20  Creatinine 0.44 - 1.00 mg/dL 1.66(H) 1.28(H) 1.54(H)  Sodium 135 - 145 mmol/L 139 140 139  Potassium 3.5 - 5.1 mmol/L 4.5 4.2 3.8  Chloride 98 - 111 mmol/L 107 105 105  CO2 22 - 32 mmol/L 22 21(L) 23  Calcium 8.9 - 10.3 mg/dL 9.2 9.1 9.5     Pertinent Microbiology Results for orders placed or performed during the hospital encounter of 09/15/20  SARS CORONAVIRUS 2 (TAT 6-24 HRS) Nasopharyngeal Nasopharyngeal Swab     Status: None   Collection Time: 09/15/20  9:23 AM   Specimen: Nasopharyngeal Swab  Result Value Ref Range Status   SARS Coronavirus 2 NEGATIVE NEGATIVE Final    Comment: (NOTE) SARS-CoV-2 target nucleic acids are NOT DETECTED.  The SARS-CoV-2 RNA is generally  detectable in upper and lower respiratory specimens during the acute phase of infection. Negative results do not preclude SARS-CoV-2 infection, do not rule out co-infections with other pathogens, and should not be used as the sole basis for treatment or other patient management decisions. Negative results must be combined with clinical observations, patient history, and epidemiological information. The expected result is Negative.  Fact Sheet for Patients: SugarRoll.be  Fact Sheet for Healthcare Providers: https://www.woods-mathews.com/  This test is not yet approved or cleared by the [place] FDA and  has been authorized for detection and/or diagnosis of SARS-CoV-2 by FDA under an Emergency Use Authorization (EUA).  This EUA will remain  in effect (meaning this test can be used) for the duration of the COVID-19 declaration under Se ction 564(b)(1) of the Act, 21 U.S.C. section 360bbb-3(b)(1), unless the authorization is terminated or revoked sooner.  Performed at [facility], [place] [address], [place], [place] [address]     Pertinent Pathology 09/17/20 FINAL MICROSCOPIC DIAGNOSIS:   A. DUODENUM, BIOPSY:  - Duodenal mucosa with no significant pathologic findings.  - Negative for increased intraepithelial lymphocytes and villous  architectural changes.   B. STOMACH, BIOPSY:  - Chronic active gastritis with Helicobacter pylori [[place]-Starry  stain performed].   C. COLON, ASCENDING, POLYPECTOMY:  - Early/evolving tubular adenoma.  - Negative for high-grade dysplasia.   Pertinent Imaging 09/17/20 Colonoscopy   - A single non-bleeding colonic angiodysplastic lesion. - A single non-bleeding colonic angiodysplastic lesion. - One 2 mm polyp in the ascending colon, removed with a cold biopsy forceps. Resected and retrieved. - Diverticulosis in the sigmoid colon and in the descending colon. - Non-bleeding internal  hemorrhoids.  09/17/20 Upper GI Endoscopy   Findings: Diffuse atrophic mucosa was found in the gastric antrum. Biopsies were taken from body and antrum with a cold forceps for Helicobacter pylori testing. There was no presence of inflammation. Of note, there was no evidence of any previous gastric/bariatric surgery. The examined duodenum was normal. Biopsies were taken with a cold forceps for histology. - Normal esophagus. - Gastric mucosal atrophy. Biopsied. - Normal examined duodenum. Biopsied.  All pertinent labs/Imagings/notes reviewed. All pertinent plain films and CT images have been personally visualized and interpreted; radiology reports have been reviewed. Decision making incorporated into the Impression / Recommendations.  I have spent 35 minutes for this patient encounter including  review of prior medical records with greater than 50% of time in face to face counsel of the patient/discussing diagnostics and plan of care.   Electronically signed by:  Rosiland Oz, MD Infectious Disease Physician [facility] [address] [place], [place] [address] Phone: [phone]  Fax: [phone]

## 2020-12-22 ENCOUNTER — Other Ambulatory Visit: Payer: Medicare Other

## 2020-12-22 ENCOUNTER — Other Ambulatory Visit: Payer: Self-pay

## 2020-12-22 ENCOUNTER — Ambulatory Visit: Payer: Medicare Other | Admitting: Allergy & Immunology

## 2020-12-22 ENCOUNTER — Encounter: Payer: Self-pay | Admitting: Allergy & Immunology

## 2020-12-22 VITALS — BP 126/80 | HR 67 | Temp 98.0°F | Resp 16 | Ht 60.0 in | Wt 237.0 lb

## 2020-12-22 DIAGNOSIS — T63481D Toxic effect of venom of other arthropod, accidental (unintentional), subsequent encounter: Secondary | ICD-10-CM

## 2020-12-22 DIAGNOSIS — Z889 Allergy status to unspecified drugs, medicaments and biological substances status: Secondary | ICD-10-CM | POA: Diagnosis not present

## 2020-12-22 DIAGNOSIS — T7800XD Anaphylactic reaction due to unspecified food, subsequent encounter: Secondary | ICD-10-CM | POA: Diagnosis not present

## 2020-12-22 DIAGNOSIS — J310 Chronic rhinitis: Secondary | ICD-10-CM | POA: Diagnosis not present

## 2020-12-22 DIAGNOSIS — B9681 Helicobacter pylori [H. pylori] as the cause of diseases classified elsewhere: Secondary | ICD-10-CM

## 2020-12-22 DIAGNOSIS — K2970 Gastritis, unspecified, without bleeding: Secondary | ICD-10-CM

## 2020-12-22 NOTE — Patient Instructions (Addendum)
1. Chronic rhinitis - We did not do repeat testing since your symptoms were controlled without the use of medications. - We can revisit that in the future if needed.  2. Anaphylactic shock due to food - Testing was negative to peanuts and corn. - However, I would just continue to avoid since it causes rashes. - EpiPen is up to date.  3. Insect sting allergy - We could get a stinging insect panel in the future if needed. - EpiPen is up to date. - We could consider venom shots in the future to prevent reactions, although if you do not spend a lot of time outdoors, this might not be worth it.   4. Drug allergy - We know that 90% of patients lose their penicillin allergy after ten years. - Therefore, since the reaction was not severe AND it has been several decades since you had the reaction, I do not think that we need to worry about testing for penicillin. - We will just do a graded challenge in the office next Monday @ 1:30pm.  - This will take a couple of hours in total.  - Script sent in (pick it up and bring it to the appointment on Monday).  5. Return in about 5 days (around 12/27/2020).    Please inform us of any Emergency Department visits, hospitalizations, or changes in symptoms. Call us before going to the ED for breathing or allergy symptoms since we might be able to fit you in for a sick visit. Feel free to contact us anytime with any questions, problems, or concerns.  It was a pleasure to meet you and your family today!  Websites that have reliable patient information: 1. American Academy of Asthma, Allergy, and Immunology: www.aaaai.org 2. Food Allergy Research and Education (FARE): foodallergy.org 3. Mothers of Asthmatics: http://www.asthmacommunitynetwork.org 4. American College of Allergy, Asthma, and Immunology: www.acaai.org   COVID-19 Vaccine Information can be found at: ShippingScam.co.uk For questions  related to vaccine distribution or appointments, please email [email or web address] or call [phone].   We realize that you might be concerned about having an allergic reaction to the COVID19 vaccines. To help with that concern, WE ARE OFFERING THE COVID19 VACCINES IN OUR OFFICE! Ask the front desk for dates!     "Like" [place] on Facebook and Instagram for our latest updates!      A healthy democracy works best when [employer] participate! Make sure you are registered to vote! If you have moved or changed any of your contact information, you will need to get this updated before voting!  In some cases, you MAY be able to register to vote online: [email or web address]

## 2020-12-22 NOTE — Progress Notes (Signed)
NEW PATIENT  Date of Service/Encounter:  12/22/20  Consult requested by: Glenda Chroman, MD   Assessment:   Anaphylactic shock due to food  Chronic rhinitis  Insect sting allergy - consider sending stinging insect panel  Drug allergy - needs penicillin challenge  Plan/Recommendations:   1. Chronic rhinitis - We did not do repeat testing since your symptoms were controlled without the use of medications. - We can revisit that in the future if needed.  2. Anaphylactic shock due to food - Testing was negative to peanuts and corn. - However, I would just continue to avoid since it causes rashes. - EpiPen is up to date.  3. Insect sting allergy - We could get a stinging insect panel in the future if needed. - EpiPen is up to date. - We could consider venom shots in the future to prevent reactions, although if you do not spend a lot of time outdoors, this might not be worth it.   4. Drug allergy - We know that 90% of patients lose their penicillin allergy after ten years. - Therefore, since the reaction was not severe AND it has been several decades since you had the reaction, I do not think that we need to worry about testing for penicillin. - We will just do a graded challenge in the office next Monday @ 1:30pm.  - This will take a couple of hours in total.  - Script sent in (pick it up and bring it to the appointment on Monday).  5. Return in about 5 days (around 12/27/2020).    This note in its entirety was forwarded to the Provider who requested this consultation.  Subjective:   [name] is a 75 y.o. female presenting today for evaluation of  Chief Complaint  Patient presents with   Allergic Rhinitis     Has a cold now - stuffy, has been taking tylenol    Allergic Reaction    Penicillin - causes rash and she had not taken it for 65 years     [name] has a history of the following: Patient Active Problem List   Diagnosis Date Noted    Helicobacter pylori gastritis 10/13/2020   Tinea pedis of both feet 10/13/2020   Penicillin allergy 10/13/2020   RUQ abdominal pain 08/26/2020   History of colonic polyps 08/26/2020   OSA on CPAP 12/27/2017   Other chest pain    CVA (cerebral vascular accident) ([place]) 11/30/2016   Morbid obesity ([place]) 11/30/2016   Essential hypertension 11/30/2016   H/O gastric bypass 11/30/2016   DOE (dyspnea on exertion)    DM2 (diabetes mellitus, type 2) ([place]) 04/28/2003    History obtained from: chart review and patient and her husband .  [employer] was referred by Glenda Chroman, MD.     [place] is a 75 y.o. female presenting for an evaluation of multiple atopic complaints.  Patient reports that she is allergic to penicillin and she is infected with Heliobact pylori. She is trying to be treated for that. But she has failed treatment with the alternative. She had an endoscopy and colonoscopy in May 2022. She was treated for four weeks. She had a stool sample done today to see if it is eradicated. She continues to have stomach pains, however.   Review of her note shows that she was treated with combination including metronidazole, tetracycline, omeprazole, and bismuth.  Per the notes, she was unable to tolerate this due to nausea and  vomiting.  She is followed by Dr. Rosiland Oz.  She was mostly recently treated with 2 weeks of pantoprazole, clarithromycin, and metronidazole.  She reacted with a rash with penicillin. She is unsure what she was being treated for. This was when she was a little girl. She is unsure of the details and she does not think that she went to the hospital. She has not had any penicillins since that point in time.    Allergic Rhinitis Symptom History: She has environmental allergies and she received shots for 10 years. She was told that she would "drop dead" without shot. Her symptoms are fairly well controlled at this point. She does have some postnasal drip.  She has  symptoms throughout the year. She denies postnasal drip and throat clearing. She might have it a couple of times per month at the most.  Food Allergy Symptom History: She is allergic to peanuts and corn. She has breakouts on her arms. She has no throat swelling or other systemic symptoms. She has been tested 30 years ago. She was told that she was "allergic to everything in the world".  She has never had to use her EpiPen recently.  She also avoids going because it tends to throw her blood sugar off.  Stinging Insect Allergy History: She reacted to a bee sting ten years ago. She was tested for this as well way back when. She does have an EpiPen on hand.  She was never on venom immunotherapy.  She does not really spend much time outdoors and is not interested in testing.  She has a complex medical history including obesity status post gastric bypass, anxiety, depression, diabetes, hyperlipidemia, hypertension, myocardial infarction, and stroke.  Otherwise, there is no history of other atopic diseases, including eczema, urticaria, or contact dermatitis. There is no significant infectious history. Vaccinations are up to date.    Past Medical History: Patient Active Problem List   Diagnosis Date Noted   Helicobacter pylori gastritis 10/13/2020   Tinea pedis of both feet 10/13/2020   Penicillin allergy 10/13/2020   RUQ abdominal pain 08/26/2020   History of colonic polyps 08/26/2020   OSA on CPAP 12/27/2017   Other chest pain    CVA (cerebral vascular accident) ([place]) 11/30/2016   Morbid obesity ([place]) 11/30/2016   Essential hypertension 11/30/2016   H/O gastric bypass 11/30/2016   DOE (dyspnea on exertion)    DM2 (diabetes mellitus, type 2) ([place]) 04/28/2003    Medication List:  Allergies as of 12/22/2020       Reactions   Corn Oil Rash   Other Rash   PT STATES SHE IS ALLERGIC TO MOST FOODS-STATES IT WAS FOUND IN ALLERGY TESTING   Peanut Allergen Powder-dnfp Rash   Peanut-containing Drug  Products Rash   Penicillins Rash   Has patient had a PCN reaction causing immediate rash, facial/tongue/throat swelling, SOB or lightheadedness with hypotension: Yes Has patient had a PCN reaction causing severe rash involving mucus membranes or skin necrosis: No Has patient had a PCN reaction that required hospitalization No Has patient had a PCN reaction occurring within the last 10 years: No. Has not had in 40-50 years If all of the above answers are "NO", then may proceed with Cephalosporin use.        Medication List        Accurate as of December 22, 2020 11:59 PM. If you have any questions, ask your nurse or doctor.          Accu-Chek  Aviva Plus test strip Generic drug: glucose blood Inject 1 strip as directed daily.   acetaminophen 500 MG tablet Commonly known as: TYLENOL Take 500 mg by mouth every 6 (six) hours as needed for mild pain or headache. As needed.   aspirin 81 MG EC tablet Take 81 mg by mouth daily.   CALCIUM 600-D PO Take 1 tablet by mouth 2 (two) times a week.   dicyclomine 10 MG capsule Commonly known as: Bentyl Take 1 capsule (10 mg total) by mouth every 8 (eight) hours as needed for spasms.   EPINEPHrine 0.3 mg/0.3 mL Soaj injection Commonly known as: EPI-PEN Inject 0.3 mg into the muscle as needed for anaphylaxis.   ergocalciferol 1.25 MG (50000 UT) capsule Commonly known as: VITAMIN D2 Take 5,000 Units by mouth 2 (two) times daily.   ferrous sulfate 325 (65 FE) MG tablet Take 325 mg by mouth daily with breakfast.   glimepiride 4 MG tablet Commonly known as: AMARYL Take 4 mg by mouth 2 (two) times daily.   insulin lispro 100 UNIT/ML injection Commonly known as: HUMALOG Inject 10 Units into the skin 3 (three) times daily before meals. If blood glucose is over 150   Lancets Thin Misc Inject 1 Stick into the skin daily.   levothyroxine 25 MCG tablet Commonly known as: SYNTHROID Take 25 mcg by mouth daily.   metFORMIN 1000 MG  tablet Commonly known as: GLUCOPHAGE Take 1 tablet (1,000 mg total) by mouth 2 (two) times daily.   Multivitamin Adult Chew Chew 2 tablets by mouth daily at 12 noon.   olmesartan-hydrochlorothiazide 20-12.5 MG tablet Commonly known as: Benicar HCT Take 1 tablet by mouth daily.   POTASSIUM PO Take 10 mg by mouth every evening.   terbinafine 1 % cream Commonly known as: LamISIL AT Apply 1 application topically 2 (two) times daily.        Birth History: non-contributory  Developmental History: non-contributory  Past Surgical History: Past Surgical History:  Procedure Laterality Date   ABDOMINAL HYSTERECTOMY     BARIATRIC SURGERY     BIOPSY  09/17/2020   Procedure: BIOPSY;  Surgeon: Harvel Quale, MD;  [facility];  Service: Gastroenterology;;   CARDIAC CATHETERIZATION N/A 12/24/2015   Procedure: Right/Left Heart Cath and Coronary Angiography;  Surgeon: Jettie Booze, MD;  Location: [facility];  Service: Cardiovascular;  Laterality: N/A;   COLONOSCOPY  2018   COLONOSCOPY WITH PROPOFOL N/A 09/17/2020   Procedure: COLONOSCOPY WITH PROPOFOL;  Surgeon: Harvel Quale, MD;  [facility];  Service: Gastroenterology;  Laterality: N/A;  9:00   ESOPHAGOGASTRODUODENOSCOPY (EGD) WITH PROPOFOL N/A 09/17/2020   Procedure: ESOPHAGOGASTRODUODENOSCOPY (EGD) WITH PROPOFOL;  Surgeon: Harvel Quale, MD;  [facility];  Service: Gastroenterology;  Laterality: N/A;   HERNIA REPAIR     LEFT HEART CATH AND CORONARY ANGIOGRAPHY N/A 07/12/2017   Procedure: LEFT HEART CATH AND CORONARY ANGIOGRAPHY;  Surgeon: Belva Crome, MD;  Location: [facility];  Service: Cardiovascular;  Laterality: N/A;     Family History: Family History  Problem Relation Age of Onset   Thyroid cancer Father    Diabetes Father    Esophageal cancer Father    Colon cancer Mother    Diabetes Mother    Colon cancer Maternal Grandmother     Stroke Maternal Grandfather    Stomach cancer Paternal Grandfather    Colon cancer Son    Breast cancer Maternal Aunt  Colon cancer Maternal Aunt    Breast cancer Maternal Aunt    Colon polyps Neg Hx    Rectal cancer Neg Hx      Social History: [place] lives at home with her husband.  She is retired.  She has no pets.   She is not a current smoker.  She was working as a Statistician.   Review of Systems  Constitutional: Negative.  Negative for chills, fever, malaise/fatigue and weight loss.  HENT: Negative.  Negative for congestion, ear discharge, ear pain, sinus pain and sore throat.   Eyes:  Negative for pain, discharge and redness.  Respiratory:  Positive for shortness of breath. Negative for cough, sputum production and wheezing.   Cardiovascular: Negative.  Negative for chest pain and palpitations.  Gastrointestinal:  Negative for abdominal pain, constipation, diarrhea, heartburn, nausea and vomiting.  Musculoskeletal:  Positive for myalgias. Negative for neck pain.  Skin: Negative.  Negative for itching and rash.  Neurological:  Negative for dizziness and headaches.  Endo/Heme/Allergies:  Positive for environmental allergies. Bruises/bleeds easily.       Positive for food allergies.      Objective:   Blood pressure 126/80, pulse 67, temperature 98 F (36.7 C), resp. rate 16, height 5' (1.524 m), weight 237 lb (107.5 kg), SpO2 99 %. Body mass index is 46.29 kg/m.   Physical Exam:   Physical Exam Vitals reviewed.  Constitutional:      Appearance: Normal appearance. She is well-developed. She is obese.  HENT:     Head: Normocephalic and atraumatic.     Right Ear: Tympanic membrane, ear canal and external ear normal. No drainage, swelling or tenderness. Tympanic membrane is not injected, scarred, erythematous, retracted or bulging.     Left Ear: Tympanic membrane, ear canal and external ear normal. No drainage, swelling or tenderness. Tympanic membrane  is not injected, scarred, erythematous, retracted or bulging.     Nose: No nasal deformity, septal deviation, mucosal edema or rhinorrhea.     Right Turbinates: Enlarged, swollen and pale.     Left Turbinates: Enlarged, swollen and pale.     Right Sinus: No maxillary sinus tenderness or frontal sinus tenderness.     Left Sinus: No maxillary sinus tenderness or frontal sinus tenderness.     Mouth/Throat:     Mouth: Mucous membranes are not pale and not dry.     Pharynx: Uvula midline.  Eyes:     General: Allergic shiner present.        Right eye: No discharge.        Left eye: No discharge.     Conjunctiva/sclera: Conjunctivae normal.     Right eye: Right conjunctiva is not injected. No chemosis.    Left eye: Left conjunctiva is not injected. No chemosis.    Pupils: Pupils are equal, round, and reactive to light.  Cardiovascular:     Rate and Rhythm: Normal rate and regular rhythm.     Heart sounds: Normal heart sounds.  Pulmonary:     Effort: Pulmonary effort is normal. No tachypnea, accessory muscle usage or respiratory distress.     Breath sounds: Normal breath sounds. No wheezing, rhonchi or rales.     Comments: Difficult to appreciate due to body habitus.  Chest:     Chest wall: No tenderness.  Abdominal:     Tenderness: There is no abdominal tenderness. There is no guarding or rebound.  Lymphadenopathy:     Head:     Right side of head:  No submandibular, tonsillar or occipital adenopathy.     Left side of head: No submandibular, tonsillar or occipital adenopathy.     Cervical: No cervical adenopathy.  Skin:    General: Skin is warm.     Capillary Refill: Capillary refill takes less than 2 seconds.     Coloration: Skin is not pale.     Findings: No abrasion, erythema, petechiae or rash. Rash is not papular, urticarial or vesicular.  Neurological:     Mental Status: She is alert.  Psychiatric:        Behavior: Behavior is cooperative.     Diagnostic studies:   Allergy  Studies:    Food Adult Perc - 12/22/20 1500     Time Antigen Placed 1517    Allergen Manufacturer Lavella Hammock    Location Arm    Number of allergen test 4     Control-buffer 50% Glycerol Negative    Control-Histamine 1 mg/ml 2+    1. Peanut Negative    53. Corn Negative             Allergy testing results were read and interpreted by myself, documented by clinical staff.         Salvatore Marvel, MD [facility]

## 2020-12-22 NOTE — Addendum Note (Signed)
Addended by: Caffie Pinto on: 12/22/2020 02:52 PM   Modules accepted: Orders

## 2020-12-23 ENCOUNTER — Telehealth: Payer: Self-pay

## 2020-12-23 ENCOUNTER — Encounter: Payer: Self-pay | Admitting: Allergy & Immunology

## 2020-12-23 ENCOUNTER — Telehealth: Payer: Self-pay | Admitting: *Deleted

## 2020-12-23 LAB — HELICOBACTER PYLORI  SPECIAL ANTIGEN
MICRO NUMBER:: 12285681
SPECIMEN QUALITY: ADEQUATE

## 2020-12-23 MED ORDER — PENICILLIN V POTASSIUM 125 MG/5ML PO SOLR: ORAL | 0 refills | Status: DC

## 2020-12-23 NOTE — Telephone Encounter (Signed)
-----   Message from Rosiland Oz, MD sent at 12/23/2020  4:36 PM EDT ----- Please let patient know she was tested negative for H pylori antigen in stool and hence, has been treated adequately for it.

## 2020-12-23 NOTE — Telephone Encounter (Signed)
Received a fax from patient's pharmacy stating that the only strength they have of Penicillin is '250mg'$ /47m and they wanted to know if it was ok to switch to this for the patient's challenge?

## 2020-12-23 NOTE — Telephone Encounter (Signed)
Results given to patient. No questions or concerns at this time.   Kiyo Heal Lorita Officer, RN

## 2020-12-24 ENCOUNTER — Other Ambulatory Visit: Payer: Self-pay

## 2020-12-24 NOTE — Telephone Encounter (Signed)
I spoke to pharmacist and gave verbal order to switch to penicillin '250mg'$ /42m. They will be getting prescription ready for patient

## 2020-12-24 NOTE — Telephone Encounter (Signed)
That is what I thought the strength was.  But I could not get the 250/5 to pull up when I ordered it.  Go ahead and send that in.  Thank you so much!  Salvatore Marvel, MD [facility]

## 2020-12-27 ENCOUNTER — Other Ambulatory Visit: Payer: Self-pay

## 2020-12-27 ENCOUNTER — Ambulatory Visit (INDEPENDENT_AMBULATORY_CARE_PROVIDER_SITE_OTHER): Payer: Medicare Other | Admitting: Family Medicine

## 2020-12-27 ENCOUNTER — Encounter: Payer: Self-pay | Admitting: Family Medicine

## 2020-12-27 VITALS — BP 118/70 | HR 64 | Temp 98.7°F | Resp 18

## 2020-12-27 DIAGNOSIS — T50905D Adverse effect of unspecified drugs, medicaments and biological substances, subsequent encounter: Secondary | ICD-10-CM

## 2020-12-27 DIAGNOSIS — T886XXD Anaphylactic reaction due to adverse effect of correct drug or medicament properly administered, subsequent encounter: Secondary | ICD-10-CM

## 2020-12-27 NOTE — Patient Instructions (Signed)
Office oral penicillin challenge [name] was able to tolerate the oral penicillin challenge today at the office without adverse signs or symptoms of an allergic reaction. Therefore, she has the same risk of systemic reaction associated with the use of penicillin as the general population.  - Do not give any penicillin  for the next 24 hours. - Monitor for allergic symptoms such as rash, wheezing, diarrhea, swelling, and vomiting for the next 24 hours. If severe symptoms occur call 911. For less severe symptoms treat with Benadryl 50 mg once every 4 hours and call the clinic.   Call the clinic if this treatment plan is not working well for you  Follow up in 2 months or sooner if needed.

## 2020-12-27 NOTE — Progress Notes (Signed)
Jessica Strong, Jessica Strong [id number] Dept: [phone]  FOLLOW UP NOTE  Patient ID: Jessica Strong, female    DOB: 01-11-1946  Age: 75 y.o. MRN: [id number] Date of Office Visit: 12/27/2020  Assessment  Chief Complaint: Food/Drug Challenge  HPI Jessica Strong is a 75 year old female who presents the clinic for follow-up visit with an oral office challenge to penicillin.  She was last seen in this clinic on 12/22/2020 by Dr. Ernst Bowler for evaluation of chronic rhinitis, insect sting allergy, drug allergy to penicillin, and food allergy to peanut and corn.  She is accompanied by her husband who assists with history.  At today's visit, she reports that she is feeling well with no cardiopulmonary, gastrointestinal, or integumentary symptoms.  She has not taken antihistamines in the last 3 days.  She does report that she had a reaction to penicillin in the form of rash that occurred between 60 and 70 years ago.  Since that time, she has avoided penicillin.  Her current medications are listed in the chart.  Drug Allergies:  Allergies  Allergen Reactions   Corn Oil Rash   Other Rash    PT STATES SHE IS ALLERGIC TO MOST FOODS-STATES IT WAS FOUND IN ALLERGY TESTING   Peanut Allergen Powder-Dnfp Rash   Peanut-Containing Drug Products Rash   Penicillins Rash    Has patient had a PCN reaction causing immediate rash, facial/tongue/throat swelling, SOB or lightheadedness with hypotension: Yes Has patient had a PCN reaction causing severe rash involving mucus membranes or skin necrosis: No Has patient had a PCN reaction that required hospitalization No Has patient had a PCN reaction occurring within the last 10 years: No. Has not had in 40-50 years If all of the above answers are "NO", then may proceed with Cephalosporin use.    Physical Exam: BP 118/70 (BP Location: Left Arm, Patient Position: Sitting, Cuff Size: Large)   Pulse 64   Temp 98.7 F (37.1 C) (Temporal)   Resp 18    SpO2 97%    Physical Exam Vitals reviewed.  Constitutional:      Appearance: Normal appearance.  HENT:     Head: Normocephalic and atraumatic.     Right Ear: Tympanic membrane normal.     Left Ear: Tympanic membrane normal.     Nose:     Comments: Bilateral nares slightly erythematous with clear nasal drainage noted.  Pharynx normal.  Ears normal.  Eyes normal.    Mouth/Throat:     Pharynx: Oropharynx is clear.  Eyes:     Conjunctiva/sclera: Conjunctivae normal.  Cardiovascular:     Rate and Rhythm: Normal rate and regular rhythm.     Heart sounds: Normal heart sounds. No murmur heard. Pulmonary:     Effort: Pulmonary effort is normal.     Breath sounds: Normal breath sounds.     Comments: Lungs clear to auscultation Musculoskeletal:        General: Normal range of motion.     Cervical back: Normal range of motion and neck supple.  Skin:    General: Skin is warm and dry.  Neurological:     Mental Status: She is alert and oriented to person, place, and time.  Psychiatric:        Mood and Affect: Mood normal.        Behavior: Behavior normal.        Thought Content: Thought content normal.        Judgment: Judgment normal.   Procedure  note: Written consent obtained Open graded penicillin oral challenge: The patient was able to tolerate the challenge today without adverse signs or symptoms. Vital signs were stable throughout the challenge and observation period. She received 3 doses separated by 30 minutes, each of which was separated by vitals and a brief physical exam. She received the following doses: 1%, 10%, and 90% for a total dose of 500 ml. She was monitored for 60 minutes following the last dose.   The patient was able to tolerate the open graded oral challenge today without adverse signs or symptoms. Therefore, she has the same risk of systemic reaction associated with  the use of penicillin  as the general population.  Total time  123 minutes  Assessment and  Plan: 1. Anaphylactic shock, due to adverse effect of correct medicinal substance properly administered, subsequent encounter      Patient Instructions  Office oral penicillin challenge Jessica Strong was able to tolerate the oral penicillin challenge today at the office without adverse signs or symptoms of an allergic reaction. Therefore, she has the same risk of systemic reaction associated with the use of penicillin as the general population.  - Do not give any penicillin  for the next 24 hours. - Monitor for allergic symptoms such as rash, wheezing, diarrhea, swelling, and vomiting for the next 24 hours. If severe symptoms occur call 911. For less severe symptoms treat with Benadryl 50 mg once every 4 hours and call the clinic.   Call the clinic if this treatment plan is not working well for you  Follow up in 2 months or sooner if needed.    Return in about 2 months (around 02/26/2021), or if symptoms worsen or fail to improve.    Thank you for the opportunity to care for this patient.  Please do not hesitate to contact me with questions.  Gareth Morgan, FNP [facility]

## 2020-12-29 NOTE — Addendum Note (Signed)
Addended by: Felipa Emory on: 12/29/2020 08:18 AM   Modules accepted: Orders

## 2021-02-23 ENCOUNTER — Ambulatory Visit: Payer: Medicare Other | Admitting: Allergy & Immunology

## 2021-05-31 LAB — BASIC METABOLIC PANEL
BUN: 22 — AB (ref 4–21)
Creatinine: 1.4 — AB (ref 0.5–1.1)
Glucose: 150

## 2021-06-01 LAB — COMPREHENSIVE METABOLIC PANEL: eGFR: 40

## 2021-07-11 LAB — MICROALBUMIN, URINE: Microalb, Ur: 150

## 2021-07-11 LAB — HEMOGLOBIN A1C: Hemoglobin A1C: 9.1

## 2021-07-19 ENCOUNTER — Encounter (INDEPENDENT_AMBULATORY_CARE_PROVIDER_SITE_OTHER): Payer: Self-pay | Admitting: Gastroenterology

## 2021-07-19 ENCOUNTER — Ambulatory Visit (INDEPENDENT_AMBULATORY_CARE_PROVIDER_SITE_OTHER): Payer: Medicare Other | Admitting: Gastroenterology

## 2021-07-19 ENCOUNTER — Other Ambulatory Visit (INDEPENDENT_AMBULATORY_CARE_PROVIDER_SITE_OTHER): Payer: Self-pay

## 2021-07-19 ENCOUNTER — Other Ambulatory Visit: Payer: Self-pay

## 2021-07-19 VITALS — BP 169/82 | HR 101 | Temp 98.1°F | Ht 60.0 in | Wt 263.5 lb

## 2021-07-19 DIAGNOSIS — R1011 Right upper quadrant pain: Secondary | ICD-10-CM

## 2021-07-19 DIAGNOSIS — K5900 Constipation, unspecified: Secondary | ICD-10-CM

## 2021-07-19 DIAGNOSIS — R0602 Shortness of breath: Secondary | ICD-10-CM

## 2021-07-19 DIAGNOSIS — R1031 Right lower quadrant pain: Secondary | ICD-10-CM | POA: Diagnosis not present

## 2021-07-19 DIAGNOSIS — Z01812 Encounter for preprocedural laboratory examination: Secondary | ICD-10-CM

## 2021-07-19 NOTE — Patient Instructions (Addendum)
We will get you scheduled for a CT of your abdomen to rule out other acute causes of your pain/SOB. ? ?Start taking Miralax 1 capful every day for one week. If bowel movements do not improve, increase to 1 capful every 12 hours. If after two weeks there is no improvement, increase to 1 capful every 8 hours ? ?You may need to follow up with PCP again in regards to ongoing shortness of breath. ?

## 2021-07-19 NOTE — Progress Notes (Signed)
? ?Referring Provider: Glenda Chroman, MD ?Primary Care Physician:  Glenda Chroman, MD ?Primary GI Physician: castaneda ? ?Chief Complaint  ?Patient presents with  ? Hernia  ?  Patient states she thinks she has a hernia on left side of abdomin. She states after eating the knot comes up over her stomach and she says her breathing and bowel are getting worse. She says she some constipation.  ? ?HPI:   ?Jessica Strong is a 76 y.o. female with past medical history of obesity status post Roux-en-Y gastric bypass, anxiety, depression, diabetes, hyperlipidemia, hypertension, myocardial infarction, stroke ? ?Patient presenting today for abdominal pain, constipation. ? ?Last seen April 2022 with c/o RUQ abdominal pain with intermittent nausea and vomiting. Also endorsed some SOB with exertion, evaluated by cards in the past, EF 60-65%. PFTs in 2017 that were normal. Recent labs prior to visit with hgb 9.7 MCV 94. Abd [place] in dec 2021 with probably fatty infiltration of liver, pancreas not completely visualized. Patient recommended to proceed with EGD and colonoscopy, started on Bentyl '10mg'$  Q8H PRN. CT A/P with contrast and referred to pulmonology. notably patient had presence of H pylori from biopsy on EGD, treated initially with bismuth quad therapy that she did not tolerate, sent second line treatment which she also did not tolerate, referred to infectious disease for further management. Treated with pantoprazole 40 mg p.o. twice daily, clarithromycin 500 mg p.o. twice daily and metronidazole 500 mg p.o. TID for 14 days, by ID in June 2022, H pylori antigen in august 2022 negative. ? ?TSH June 2022 WNL at 1.533, iron studies WNL,  ?CT A/P in June 2022 withNo acute intra-abdominal or pelvic pathology, Colonic diverticulosis. No bowel obstruction. Normal appendix, Fatty liver, A 4 mm nonobstructing left renal interpolar calculus. No hydronephrosis.Aortic Atherosclerosis ? ?Today, she states that she saw pulmonologist who  advised that everything looked fine with her lungs. She continues to have worsening sob. Previously had some swelling to ankles but this has improved. Also has seen cardiology that ruled out cardiology etiology. She is having continued constipation, she is taking a stool softener for this twice a week, she is having a BM maybe 4x/week. She will take the stool softener as needed when she feels constipated. Does endorse some mid abdominal pain, there does not seem to be any precipitating or alleviating factors. She has some occasional mid to right lower abdominal cramping that can be severe at times. Feels that she has a hernia that comes out sometimes in the RLQ, reports previous left sided abdominal hernia repaired in the past. She has occasional nausea and vomiting maybe every 2-3 months, usually in the middle of the night, though this is not new. Denies heart burn or acid regurgitation. Patient is unsure if she is taking bentyl, but her husband reports that she is taking it once a day in the mornings. She is unsure if this helps at all. Has not tried increasing the dose. Denies any issues with swallowing. She reports that she has gained about 40 pounds over the past few months. Denies any changes in her activity, states she does not eat much due to always feeling full. Is supposed to see endocrinology later this month for further evaluation.  ? ?EGD:09/17/20- Normal esophagus. ?- Gastric mucosal atrophy. Biopsied-H pylori positive ?- Normal examined duodenum. Biopsied. ?Last Colonoscopy: 09/17/20- A single non-bleeding colonic angiodysplastic lesion. ?- A single non-bleeding colonic angiodysplastic lesion. ?- One 2 mm polyp in the ascending colon-tubular adenoma ?- Diverticulosis  in the sigmoid colon and in the descending colon. ?- Non-bleeding internal hemorrhoids. ? ?Recommendations:  ?Repeat colonoscopy in 5 years ? ?Past Medical History:  ?Diagnosis Date  ? Allergy   ? Anemia   ? Anxiety   ? Arthritis   ?  Cataract   ? cervical ca 1990  ? Depression   ? Diabetes ([place])   ? Hypercholesterolemia   ? Hypertension   ? Myocardial infarction [facility])   ? Sleep apnea   ? on CPAP 14 mm PS since February 2017  ? Stroke [facility])   ? weakness on R side  ? Vitamin D deficiency   ? ? ?Past Surgical History:  ?Procedure Laterality Date  ? ABDOMINAL HYSTERECTOMY    ? BARIATRIC SURGERY    ? BIOPSY  09/17/2020  ? Procedure: BIOPSY;  Surgeon: Harvel Quale, MD;  [facility];  Service: Gastroenterology;;  ? CARDIAC CATHETERIZATION N/A 12/24/2015  ? Procedure: Right/Left Heart Cath and Coronary Angiography;  Surgeon: Jettie Booze, MD;  Location: [place] [facility];  Service: Cardiovascular;  Laterality: N/A;  ? COLONOSCOPY  2018  ? COLONOSCOPY WITH PROPOFOL N/A 09/17/2020  ? Procedure: COLONOSCOPY WITH PROPOFOL;  Surgeon: Harvel Quale, MD;  [facility];  Service: Gastroenterology;  Laterality: N/A;  9:00  ? ESOPHAGOGASTRODUODENOSCOPY (EGD) WITH PROPOFOL N/A 09/17/2020  ? Procedure: ESOPHAGOGASTRODUODENOSCOPY (EGD) WITH PROPOFOL;  Surgeon: Harvel Quale, MD;  [facility];  Service: Gastroenterology;  Laterality: N/A;  ? HERNIA REPAIR    ? LEFT HEART CATH AND CORONARY ANGIOGRAPHY N/A 07/12/2017  ? Procedure: LEFT HEART CATH AND CORONARY ANGIOGRAPHY;  Surgeon: Belva Crome, MD;  Location: [place] [facility];  Service: Cardiovascular;  Laterality: N/A;  ? ? ?Current Outpatient Medications  ?Medication Sig Dispense Refill  ? ACCU-CHEK AVIVA PLUS test strip Inject 1 strip as directed daily.  4  ? acetaminophen (TYLENOL) 500 MG tablet Take 500 mg by mouth every 6 (six) hours as needed for mild pain or headache. As needed.    ? aspirin 81 MG EC tablet Take 325 mg by mouth daily.    ? Calcium Carb-Cholecalciferol (CALCIUM 600-D PO) Take 1 tablet by mouth 2 (two) times a week.    ? EPINEPHrine 0.3 mg/0.3 mL IJ SOAJ injection Inject 0.3 mg into the muscle as needed for  anaphylaxis.    ? ferrous sulfate 325 (65 FE) MG tablet Take 325 mg by mouth daily with breakfast.    ? glimepiride (AMARYL) 4 MG tablet Take 4 mg by mouth 2 (two) times daily.    ? insulin lispro (HUMALOG) 100 UNIT/ML injection Inject 15 Units into the skin 3 (three) times daily before meals. If blood glucose is over 150    ? Lancets Thin MISC Inject 1 Stick into the skin daily.    ? levothyroxine (SYNTHROID) 25 MCG tablet Take 25 mcg by mouth daily.    ? metFORMIN (GLUCOPHAGE) 1000 MG tablet Take 1 tablet (1,000 mg total) by mouth 2 (two) times daily.    ? Multiple Vitamins-Minerals (MULTIVITAMIN ADULT) CHEW Chew 2 tablets by mouth daily at 12 noon.    ? olmesartan-hydrochlorothiazide (BENICAR HCT) 20-12.5 MG tablet Take 1 tablet by mouth daily. 30 tablet 11  ? OVER THE COUNTER MEDICATION in the morning and at bedtime. Vit D 3(2000)  once two times per day    ? POTASSIUM PO Take 10 mg by mouth every evening.    ? terbinafine (LAMISIL AT) 1 %  cream Apply 1 application topically 2 (two) times daily. 30 g 2  ? dicyclomine (BENTYL) 10 MG capsule Take 1 capsule (10 mg total) by mouth every 8 (eight) hours as needed for spasms. (Patient not taking: Reported on 07/19/2021) 60 capsule 2  ? ?No current facility-administered medications for this visit.  ? ? ?Allergies as of 07/19/2021 - Review Complete 07/19/2021  ?Allergen Reaction Noted  ? Corn oil Rash 12/26/2017  ? Other Rash 12/22/2015  ? Peanut allergen powder-dnfp Rash 12/26/2017  ? Peanut-containing drug products Rash 12/22/2015  ? Penicillins Rash   ? ? ?Family History  ?Problem Relation Age of Onset  ? Thyroid cancer Father   ? Diabetes Father   ? Esophageal cancer Father   ? Colon cancer Mother   ? Diabetes Mother   ? Colon cancer Maternal Grandmother   ? Stroke Maternal Grandfather   ? Stomach cancer Paternal Grandfather   ? Colon cancer Son   ? Breast cancer Maternal Aunt   ? Colon cancer Maternal Aunt   ? Breast cancer Maternal Aunt   ? Colon polyps Neg Hx   ?  Rectal cancer Neg Hx   ? ? ?Social History  ? ?Socioeconomic History  ? Marital status: Married  ?  Spouse name: Not on file  ? Number of children: 2  ? Years of education: Not on file  ? Highest education level: Not on f

## 2021-07-20 DIAGNOSIS — R1031 Right lower quadrant pain: Secondary | ICD-10-CM | POA: Insufficient documentation

## 2021-07-20 DIAGNOSIS — K5900 Constipation, unspecified: Secondary | ICD-10-CM | POA: Insufficient documentation

## 2021-07-21 ENCOUNTER — Encounter (HOSPITAL_BASED_OUTPATIENT_CLINIC_OR_DEPARTMENT_OTHER): Payer: Self-pay

## 2021-07-21 ENCOUNTER — Other Ambulatory Visit (INDEPENDENT_AMBULATORY_CARE_PROVIDER_SITE_OTHER): Payer: Self-pay | Admitting: Gastroenterology

## 2021-07-21 ENCOUNTER — Other Ambulatory Visit: Payer: Self-pay

## 2021-07-21 ENCOUNTER — Ambulatory Visit (HOSPITAL_BASED_OUTPATIENT_CLINIC_OR_DEPARTMENT_OTHER)
Admission: RE | Admit: 2021-07-21 | Discharge: 2021-07-21 | Disposition: A | Discharge status: Home / Self Care | Payer: Medicare Other | Source: Ambulatory Visit | Attending: Gastroenterology | Admitting: Gastroenterology

## 2021-07-21 DIAGNOSIS — R1031 Right lower quadrant pain: Secondary | ICD-10-CM | POA: Diagnosis present

## 2021-07-21 DIAGNOSIS — K5900 Constipation, unspecified: Secondary | ICD-10-CM | POA: Diagnosis present

## 2021-07-21 LAB — POCT I-STAT CREATININE: Creatinine, Ser: 1.8 mg/dL — ABNORMAL HIGH (ref 0.44–1.00)

## 2021-07-21 MED ORDER — IOHEXOL 300 MG/ML  SOLN: 100.0000 mL | Freq: Once | INTRAMUSCULAR | Status: DC | PRN

## 2021-07-27 ENCOUNTER — Ambulatory Visit: Payer: Medicare Other | Admitting: Nurse Practitioner

## 2021-07-27 ENCOUNTER — Encounter: Payer: Self-pay | Admitting: Nurse Practitioner

## 2021-07-27 VITALS — BP 141/83 | HR 78 | Ht 59.0 in | Wt 260.4 lb

## 2021-07-27 DIAGNOSIS — Z794 Long term (current) use of insulin: Secondary | ICD-10-CM

## 2021-07-27 DIAGNOSIS — E782 Mixed hyperlipidemia: Secondary | ICD-10-CM

## 2021-07-27 DIAGNOSIS — E1122 Type 2 diabetes mellitus with diabetic chronic kidney disease: Secondary | ICD-10-CM | POA: Diagnosis not present

## 2021-07-27 DIAGNOSIS — I10 Essential (primary) hypertension: Secondary | ICD-10-CM | POA: Diagnosis not present

## 2021-07-27 DIAGNOSIS — N1832 Chronic kidney disease, stage 3b: Secondary | ICD-10-CM | POA: Diagnosis not present

## 2021-07-27 NOTE — Patient Instructions (Signed)

## 2021-07-27 NOTE — Progress Notes (Signed)
? ?                                                    Endocrinology Consult Note  ?     07/27/2021, 2:53 PM ? ? ?Subjective:  ? ? Patient ID: Jessica Strong, female    DOB: [birth year].  ?Jessica Strong is being seen in consultation for management of currently uncontrolled symptomatic diabetes requested by  Glenda Chroman, MD. ? ? ?Past Medical History:  ?Diagnosis Date  ? Allergy   ? Anemia   ? Anxiety   ? Arthritis   ? Cataract   ? cervical ca 1990  ? Depression   ? Diabetes ([place])   ? Hypercholesterolemia   ? Hypertension   ? Myocardial infarction [facility])   ? Sleep apnea   ? on CPAP 14 mm PS since February 2017  ? Stroke [facility])   ? weakness on R side  ? Vitamin D deficiency   ? ? ?Past Surgical History:  ?Procedure Laterality Date  ? ABDOMINAL HYSTERECTOMY    ? BARIATRIC SURGERY    ? BIOPSY  09/17/2020  ? Procedure: BIOPSY;  Surgeon: Harvel Quale, MD;  [facility];  Service: Gastroenterology;;  ? CARDIAC CATHETERIZATION N/A 12/24/2015  ? Procedure: Right/Left Heart Cath and Coronary Angiography;  Surgeon: Jettie Booze, MD;  Location: [place] [facility];  Service: Cardiovascular;  Laterality: N/A;  ? COLONOSCOPY  2018  ? COLONOSCOPY WITH PROPOFOL N/A 09/17/2020  ? Procedure: COLONOSCOPY WITH PROPOFOL;  Surgeon: Harvel Quale, MD;  [facility];  Service: Gastroenterology;  Laterality: N/A;  9:00  ? ESOPHAGOGASTRODUODENOSCOPY (EGD) WITH PROPOFOL N/A 09/17/2020  ? Procedure: ESOPHAGOGASTRODUODENOSCOPY (EGD) WITH PROPOFOL;  Surgeon: Harvel Quale, MD;  [facility];  Service: Gastroenterology;  Laterality: N/A;  ? HERNIA REPAIR    ? LEFT HEART CATH AND CORONARY ANGIOGRAPHY N/A 07/12/2017  ? Procedure: LEFT HEART CATH AND CORONARY ANGIOGRAPHY;  Surgeon: Belva Crome, MD;  Location: [place] [facility];  Service: Cardiovascular;  Laterality: N/A;  ? ? ?Social History  ? ?Socioeconomic History  ? Marital status: Married  ?  Spouse  name: Not on file  ? Number of children: 2  ? Years of education: Not on file  ? Highest education level: Not on file  ?Occupational History  ? Occupation: retired  ?Tobacco Use  ? Smoking status: Never  ? Smokeless tobacco: Never  ?Vaping Use  ? Vaping Use: Never used  ?Substance and Sexual Activity  ? Alcohol use: Yes  ?  Alcohol/week: 0.0 standard drinks  ?  Comment: occasionally  ? Drug use: No  ? Sexual activity: Never  ?  Partners: Male  ?Other Topics Concern  ? Not on file  ?Social History Narrative  ? Not on file  ? ?Social Determinants of Health  ? ?Financial Resource Strain: Not on file  ?Food Insecurity: Not on file  ?Transportation Needs: Not on file  ?Physical Activity: Not on file  ?Stress: Not on file  ?Social Connections: Not on file  ? ? ?Family History  ?Problem Relation Age of Onset  ? Cancer Mother   ? Colon cancer Mother   ? Diabetes Mother   ? Hypertension Father   ? Thyroid disease Father   ? Thyroid cancer Father   ?  Diabetes Father   ? Esophageal cancer Father   ? Breast cancer Maternal Aunt   ? Colon cancer Maternal Aunt   ? Breast cancer Maternal Aunt   ? Colon cancer Maternal Grandmother   ? Stroke Maternal Grandfather   ? Stomach cancer Paternal Grandfather   ? Colon cancer Son   ? Colon polyps Neg Hx   ? Rectal cancer Neg Hx   ? ? ?Outpatient Encounter Medications as of 07/27/2021  ?Medication Sig  ? ACCU-CHEK AVIVA PLUS test strip Inject 1 strip as directed daily.  ? acetaminophen (TYLENOL) 500 MG tablet Take 500 mg by mouth every 6 (six) hours as needed for mild pain or headache. As needed.  ? aspirin 81 MG EC tablet Take 325 mg by mouth daily.  ? Calcium Carb-Cholecalciferol (CALCIUM 600-D PO) Take 1 tablet by mouth 2 (two) times a week.  ? EPINEPHrine 0.3 mg/0.3 mL IJ SOAJ injection Inject 0.3 mg into the muscle as needed for anaphylaxis.  ? ferrous sulfate 325 (65 FE) MG tablet Take 325 mg by mouth daily with breakfast.  ? glimepiride (AMARYL) 4 MG tablet Take 2 mg by mouth 2 (two)  times daily.  ? HUMALOG KWIKPEN 100 UNIT/ML KwikPen Inject 5-11 Units into the skin 3 (three) times daily with meals.  ? Lancets Thin MISC Inject 1 Stick into the skin daily.  ? levothyroxine (SYNTHROID) 50 MCG tablet Take 50 mcg by mouth daily.  ? Multiple Vitamins-Minerals (MULTIVITAMIN ADULT) CHEW Chew 2 tablets by mouth daily at 12 noon.  ? NOVOFINE PEN NEEDLE 32G X 6 MM MISC Inject into the skin.  ? olmesartan-hydrochlorothiazide (BENICAR HCT) 20-12.5 MG tablet Take 1 tablet by mouth daily.  ? OVER THE COUNTER MEDICATION in the morning and at bedtime. Vit D 3(2000)  once two times per day  ? potassium chloride (KLOR-CON M) 10 MEQ tablet Take 10 mEq by mouth daily.  ? simvastatin (ZOCOR) 20 MG tablet Take 20 mg by mouth at bedtime.  ? TRESIBA FLEXTOUCH 200 UNIT/ML FlexTouch Pen Inject 20 Units into the skin at bedtime.  ? [DISCONTINUED] metFORMIN (GLUCOPHAGE) 1000 MG tablet Take 1 tablet (1,000 mg total) by mouth 2 (two) times daily.  ? dicyclomine (BENTYL) 10 MG capsule Take 1 capsule (10 mg total) by mouth every 8 (eight) hours as needed for spasms. (Patient not taking: Reported on 07/19/2021)  ? [DISCONTINUED] insulin lispro (HUMALOG) 100 UNIT/ML injection Inject 15 Units into the skin 3 (three) times daily before meals. If blood glucose is over 150 (Patient not taking: Reported on 07/27/2021)  ? [DISCONTINUED] levothyroxine (SYNTHROID) 25 MCG tablet Take 25 mcg by mouth daily. (Patient not taking: Reported on 07/27/2021)  ? [DISCONTINUED] POTASSIUM PO Take 10 mg by mouth every evening. (Patient not taking: Reported on 07/27/2021)  ? [DISCONTINUED] terbinafine (LAMISIL AT) 1 % cream Apply 1 application topically 2 (two) times daily. (Patient not taking: Reported on 07/27/2021)  ? ?No facility-administered encounter medications on file as of 07/27/2021.  ? ? ?ALLERGIES: ?Allergies  ?Allergen Reactions  ? Corn Oil Rash  ? Other Rash  ?  PT STATES SHE IS ALLERGIC TO MOST FOODS-STATES IT WAS FOUND IN ALLERGY TESTING  ?  Peanut Allergen Powder-Dnfp Rash  ? Peanut-Containing Drug Products Rash  ? Penicillins Rash  ?  Has patient had a PCN reaction causing immediate rash, facial/tongue/throat swelling, SOB or lightheadedness with hypotension: Yes ?Has patient had a PCN reaction causing severe rash involving mucus membranes or skin necrosis: No ?Has patient  had a PCN reaction that required hospitalization No ?Has patient had a PCN reaction occurring within the last 10 years: No. Has not had in 40-50 years ?If all of the above answers are "NO", then may proceed with Cephalosporin use.  ? ? ?VACCINATION STATUS: ?Immunization History  ?Administered Date(s) Administered  ? Moderna Sars-Covid-2 Vaccination 07/11/2019, 08/08/2019, 08/11/2020  ? ? ?Diabetes ?She presents for her initial diabetic visit. She has type 2 diabetes mellitus. Onset time: Diagnosed at approx age of 62. Her disease course has been fluctuating. There are no hypoglycemic associated symptoms. Pertinent negatives for hypoglycemia include no sweats or tremors. Associated symptoms include fatigue, polydipsia and polyuria. There are no hypoglycemic complications. Symptoms are stable. Diabetic complications include a CVA, heart disease, nephropathy and peripheral neuropathy. Risk factors for coronary artery disease include diabetes mellitus, dyslipidemia, family history, obesity, hypertension, post-menopausal and sedentary lifestyle. Current diabetic treatment includes oral agent (dual therapy) and intensive insulin program. She is compliant with treatment most of the time. Her weight is increasing rapidly (gained 60+ lbs in last 6 months). She is following a generally unhealthy diet. When asked about meal planning, she reported none. She has not had a previous visit with a dietitian. She rarely participates in exercise. Her overall blood glucose range is >200 mg/dl. (She presents today for her consultation, accompanied by her husband, with no meter or logs to review.  Her  most recent A1c on 07/11/21 was 9.1%.  She only monitors glucose once daily.  She drinks water, some diet soda and SF juices and eats typically 2 meals per day (skipping lunch most days).  She does eat sn

## 2021-08-09 NOTE — Patient Instructions (Signed)

## 2021-08-10 ENCOUNTER — Encounter: Payer: Self-pay | Admitting: Nurse Practitioner

## 2021-08-10 ENCOUNTER — Ambulatory Visit: Payer: Medicare Other | Admitting: Nurse Practitioner

## 2021-08-10 VITALS — BP 151/83 | HR 75 | Ht 59.0 in | Wt 259.0 lb

## 2021-08-10 DIAGNOSIS — E1122 Type 2 diabetes mellitus with diabetic chronic kidney disease: Secondary | ICD-10-CM | POA: Diagnosis not present

## 2021-08-10 DIAGNOSIS — I10 Essential (primary) hypertension: Secondary | ICD-10-CM

## 2021-08-10 DIAGNOSIS — E782 Mixed hyperlipidemia: Secondary | ICD-10-CM | POA: Diagnosis not present

## 2021-08-10 DIAGNOSIS — N1832 Chronic kidney disease, stage 3b: Secondary | ICD-10-CM | POA: Diagnosis not present

## 2021-08-10 DIAGNOSIS — Z794 Long term (current) use of insulin: Secondary | ICD-10-CM

## 2021-08-10 NOTE — Progress Notes (Signed)
? ?                                                    Endocrinology Follow Up Note  ?     08/10/2021, 3:01 PM ? ? ?Subjective:  ? ? Patient ID: Jessica Strong, female    DOB: 01/30/1946.  ?Jessica Strong is being seen in follow up after being seen in consultation for management of currently uncontrolled symptomatic diabetes requested by  Glenda Chroman, MD. ? ? ?Past Medical History:  ?Diagnosis Date  ? Allergy   ? Anemia   ? Anxiety   ? Arthritis   ? Cataract   ? cervical ca 1990  ? Depression   ? Diabetes ([place])   ? Hypercholesterolemia   ? Hypertension   ? Myocardial infarction [facility])   ? Sleep apnea   ? on CPAP 14 mm PS since February 2017  ? Stroke [facility])   ? weakness on R side  ? Vitamin D deficiency   ? ? ?Past Surgical History:  ?Procedure Laterality Date  ? ABDOMINAL HYSTERECTOMY    ? BARIATRIC SURGERY    ? BIOPSY  09/17/2020  ? Procedure: BIOPSY;  Surgeon: Harvel Quale, MD;  [facility];  Service: Gastroenterology;;  ? CARDIAC CATHETERIZATION N/A 12/24/2015  ? Procedure: Right/Left Heart Cath and Coronary Angiography;  Surgeon: Jettie Booze, MD;  Location: [place] [facility];  Service: Cardiovascular;  Laterality: N/A;  ? COLONOSCOPY  2018  ? COLONOSCOPY WITH PROPOFOL N/A 09/17/2020  ? Procedure: COLONOSCOPY WITH PROPOFOL;  Surgeon: Harvel Quale, MD;  [facility];  Service: Gastroenterology;  Laterality: N/A;  9:00  ? ESOPHAGOGASTRODUODENOSCOPY (EGD) WITH PROPOFOL N/A 09/17/2020  ? Procedure: ESOPHAGOGASTRODUODENOSCOPY (EGD) WITH PROPOFOL;  Surgeon: Harvel Quale, MD;  [facility];  Service: Gastroenterology;  Laterality: N/A;  ? HERNIA REPAIR    ? LEFT HEART CATH AND CORONARY ANGIOGRAPHY N/A 07/12/2017  ? Procedure: LEFT HEART CATH AND CORONARY ANGIOGRAPHY;  Surgeon: Belva Crome, MD;  Location: [place] [facility];  Service: Cardiovascular;  Laterality: N/A;  ? ? ?Social History  ? ?Socioeconomic History  ?  Marital status: Married  ?  Spouse name: Not on file  ? Number of children: 2  ? Years of education: Not on file  ? Highest education level: Not on file  ?Occupational History  ? Occupation: retired  ?Tobacco Use  ? Smoking status: Never  ? Smokeless tobacco: Never  ?Vaping Use  ? Vaping Use: Never used  ?Substance and Sexual Activity  ? Alcohol use: Yes  ?  Alcohol/week: 0.0 standard drinks  ?  Comment: occasionally  ? Drug use: No  ? Sexual activity: Never  ?  Partners: Male  ?Other Topics Concern  ? Not on file  ?Social History Narrative  ? Not on file  ? ?Social Determinants of Health  ? ?Financial Resource Strain: Not on file  ?Food Insecurity: Not on file  ?Transportation Needs: Not on file  ?Physical Activity: Not on file  ?Stress: Not on file  ?Social Connections: Not on file  ? ? ?Family History  ?Problem Relation Age of Onset  ? Cancer Mother   ? Colon cancer Mother   ? Diabetes Mother   ? Hypertension Father   ? Thyroid disease Father   ?  Thyroid cancer Father   ? Diabetes Father   ? Esophageal cancer Father   ? Breast cancer Maternal Aunt   ? Colon cancer Maternal Aunt   ? Breast cancer Maternal Aunt   ? Colon cancer Maternal Grandmother   ? Stroke Maternal Grandfather   ? Stomach cancer Paternal Grandfather   ? Colon cancer Son   ? Colon polyps Neg Hx   ? Rectal cancer Neg Hx   ? ? ?Outpatient Encounter Medications as of 08/10/2021  ?Medication Sig  ? ACCU-CHEK AVIVA PLUS test strip Inject 1 strip as directed daily.  ? acetaminophen (TYLENOL) 500 MG tablet Take 500 mg by mouth every 6 (six) hours as needed for mild pain or headache. As needed.  ? aspirin 81 MG EC tablet Take 325 mg by mouth daily.  ? Calcium Carb-Cholecalciferol (CALCIUM 600-D PO) Take 1 tablet by mouth 2 (two) times a week.  ? EPINEPHrine 0.3 mg/0.3 mL IJ SOAJ injection Inject 0.3 mg into the muscle as needed for anaphylaxis.  ? ferrous sulfate 325 (65 FE) MG tablet Take 325 mg by mouth daily with breakfast.  ? glimepiride (AMARYL) 4 MG  tablet Take 2 mg by mouth 2 (two) times daily.  ? HUMALOG KWIKPEN 100 UNIT/ML KwikPen Inject 5-11 Units into the skin 3 (three) times daily with meals.  ? Lancets Thin MISC Inject 1 Stick into the skin daily.  ? levothyroxine (SYNTHROID) 50 MCG tablet Take 50 mcg by mouth daily.  ? Multiple Vitamins-Minerals (MULTIVITAMIN ADULT) CHEW Chew 2 tablets by mouth daily at 12 noon.  ? NOVOFINE PEN NEEDLE 32G X 6 MM MISC Inject into the skin.  ? olmesartan-hydrochlorothiazide (BENICAR HCT) 20-12.5 MG tablet Take 1 tablet by mouth daily.  ? OVER THE COUNTER MEDICATION in the morning and at bedtime. Vit D 3(2000)  once two times per day  ? potassium chloride (KLOR-CON M) 10 MEQ tablet Take 10 mEq by mouth daily.  ? simvastatin (ZOCOR) 20 MG tablet Take 20 mg by mouth at bedtime.  ? TRESIBA FLEXTOUCH 200 UNIT/ML FlexTouch Pen Inject 30 Units into the skin at bedtime.  ? dicyclomine (BENTYL) 10 MG capsule Take 1 capsule (10 mg total) by mouth every 8 (eight) hours as needed for spasms. (Patient not taking: Reported on 07/19/2021)  ? ?No facility-administered encounter medications on file as of 08/10/2021.  ? ? ?ALLERGIES: ?Allergies  ?Allergen Reactions  ? Corn Oil Rash  ? Other Rash  ?  PT STATES SHE IS ALLERGIC TO MOST FOODS-STATES IT WAS FOUND IN ALLERGY TESTING  ? Peanut Allergen Powder-Dnfp Rash  ? Peanut-Containing Drug Products Rash  ? Penicillins Rash  ?  Has patient had a PCN reaction causing immediate rash, facial/tongue/throat swelling, SOB or lightheadedness with hypotension: Yes ?Has patient had a PCN reaction causing severe rash involving mucus membranes or skin necrosis: No ?Has patient had a PCN reaction that required hospitalization No ?Has patient had a PCN reaction occurring within the last 10 years: No. Has not had in 40-50 years ?If all of the above answers are "NO", then may proceed with Cephalosporin use.  ? ? ?VACCINATION STATUS: ?Immunization History  ?Administered Date(s) Administered  ? Moderna  Sars-Covid-2 Vaccination 07/11/2019, 08/08/2019, 08/11/2020  ? ? ?Diabetes ?She presents for her follow-up diabetic visit. She has type 2 diabetes mellitus. Onset time: Diagnosed at approx age of 21. Her disease course has been stable. There are no hypoglycemic associated symptoms. Pertinent negatives for hypoglycemia include no sweats or tremors. Associated symptoms  include fatigue, polydipsia and polyuria. There are no hypoglycemic complications. Symptoms are stable. Diabetic complications include a CVA, heart disease, nephropathy and peripheral neuropathy. Risk factors for coronary artery disease include diabetes mellitus, dyslipidemia, family history, obesity, hypertension, post-menopausal and sedentary lifestyle. Current diabetic treatment includes intensive insulin program and oral agent (monotherapy). She is compliant with treatment most of the time. Her weight is stable (gained 60+ lbs in last 6 months). She is following a generally healthy diet. When asked about meal planning, she reported none. She has not had a previous visit with a dietitian. She rarely participates in exercise. Her home blood glucose trend is fluctuating minimally. Her overall blood glucose range is >200 mg/dl. (She presents today, accompanied by her husband, with her logs, no meter, showing above target glycemic profile overall.  She was not due for another A1c today.  She has been working on her diet routine, now is eating 3 meals per day and cut back on snacks and sodas.  She has been drinking more water as well.  She has not heard about the CGM device ordered through [employer].  I had received communication that they did not answer their phones, gave them the telephone number today.  She did report significant headache when glucose dropped in the 180s.) An ACE inhibitor/angiotensin II receptor blocker is being taken. She does not see a podiatrist.Eye exam is current.  ?Hypertension ?This is a chronic problem. The current episode  started more than 1 year ago. The problem has been resolved since onset. The problem is controlled. Pertinent negatives include no sweats. Agents associated with hypertension include thyroid hormones. Risk factors fo

## 2021-08-15 ENCOUNTER — Ambulatory Visit: Payer: Medicare Other | Admitting: Nurse Practitioner

## 2021-08-15 NOTE — Progress Notes (Signed)
Patient came in with her new Dexcom G7 and we went over all instructions and her new Dexcom G7 was placed in the back of her Left upper arm. Patient tolerated the sensor application well and her Receiver was linked up and was doing the warm up. Patient verbalized an understanding. ?

## 2021-08-22 ENCOUNTER — Ambulatory Visit (INDEPENDENT_AMBULATORY_CARE_PROVIDER_SITE_OTHER): Payer: Medicare Other | Admitting: Gastroenterology

## 2021-09-05 ENCOUNTER — Other Ambulatory Visit: Payer: Self-pay | Admitting: Internal Medicine

## 2021-09-05 DIAGNOSIS — Z1231 Encounter for screening mammogram for malignant neoplasm of breast: Secondary | ICD-10-CM

## 2021-09-07 ENCOUNTER — Ambulatory Visit
Admission: RE | Admit: 2021-09-07 | Discharge: 2021-09-07 | Disposition: A | Discharge status: Home / Self Care | Payer: Medicare Other | Source: Ambulatory Visit | Attending: Internal Medicine | Admitting: Internal Medicine

## 2021-09-07 DIAGNOSIS — Z1231 Encounter for screening mammogram for malignant neoplasm of breast: Secondary | ICD-10-CM

## 2021-09-12 ENCOUNTER — Ambulatory Visit: Payer: Medicare Other | Admitting: Nurse Practitioner

## 2021-09-12 ENCOUNTER — Encounter: Payer: Self-pay | Admitting: Nurse Practitioner

## 2021-09-12 VITALS — BP 156/83 | HR 61 | Ht 59.0 in | Wt 268.0 lb

## 2021-09-12 DIAGNOSIS — E1122 Type 2 diabetes mellitus with diabetic chronic kidney disease: Secondary | ICD-10-CM

## 2021-09-12 DIAGNOSIS — E782 Mixed hyperlipidemia: Secondary | ICD-10-CM

## 2021-09-12 DIAGNOSIS — I10 Essential (primary) hypertension: Secondary | ICD-10-CM

## 2021-09-12 DIAGNOSIS — N1832 Chronic kidney disease, stage 3b: Secondary | ICD-10-CM | POA: Diagnosis not present

## 2021-09-12 DIAGNOSIS — Z794 Long term (current) use of insulin: Secondary | ICD-10-CM

## 2021-09-12 NOTE — Progress Notes (Signed)
? ?                                                    Endocrinology Follow Up Note  ?     09/12/2021, 3:27 PM ? ? ?Subjective:  ? ? Patient ID: Jessica Strong, female    DOB: 1946/04/08.  ?Jessica Strong is being seen in follow up after being seen in consultation for management of currently uncontrolled symptomatic diabetes requested by  Glenda Chroman, MD. ? ? ?Past Medical History:  ?Diagnosis Date  ? Allergy   ? Anemia   ? Anxiety   ? Arthritis   ? Cataract   ? cervical ca 1990  ? Depression   ? Diabetes ([place])   ? Hypercholesterolemia   ? Hypertension   ? Myocardial infarction [facility])   ? Sleep apnea   ? on CPAP 14 mm PS since February 2017  ? Stroke [facility])   ? weakness on R side  ? Vitamin D deficiency   ? ? ?Past Surgical History:  ?Procedure Laterality Date  ? ABDOMINAL HYSTERECTOMY    ? BARIATRIC SURGERY    ? BIOPSY  09/17/2020  ? Procedure: BIOPSY;  Surgeon: Harvel Quale, MD;  [facility];  Service: Gastroenterology;;  ? CARDIAC CATHETERIZATION N/A 12/24/2015  ? Procedure: Right/Left Heart Cath and Coronary Angiography;  Surgeon: Jettie Booze, MD;  Location: [place] [facility];  Service: Cardiovascular;  Laterality: N/A;  ? COLONOSCOPY  2018  ? COLONOSCOPY WITH PROPOFOL N/A 09/17/2020  ? Procedure: COLONOSCOPY WITH PROPOFOL;  Surgeon: Harvel Quale, MD;  [facility];  Service: Gastroenterology;  Laterality: N/A;  9:00  ? ESOPHAGOGASTRODUODENOSCOPY (EGD) WITH PROPOFOL N/A 09/17/2020  ? Procedure: ESOPHAGOGASTRODUODENOSCOPY (EGD) WITH PROPOFOL;  Surgeon: Harvel Quale, MD;  [facility];  Service: Gastroenterology;  Laterality: N/A;  ? HERNIA REPAIR    ? LEFT HEART CATH AND CORONARY ANGIOGRAPHY N/A 07/12/2017  ? Procedure: LEFT HEART CATH AND CORONARY ANGIOGRAPHY;  Surgeon: Belva Crome, MD;  Location: [place] [facility];  Service: Cardiovascular;  Laterality: N/A;  ? ? ?Social History  ? ?Socioeconomic History  ?  Marital status: Married  ?  Spouse name: Not on file  ? Number of children: 2  ? Years of education: Not on file  ? Highest education level: Not on file  ?Occupational History  ? Occupation: retired  ?Tobacco Use  ? Smoking status: Never  ? Smokeless tobacco: Never  ?Vaping Use  ? Vaping Use: Never used  ?Substance and Sexual Activity  ? Alcohol use: Yes  ?  Alcohol/week: 0.0 standard drinks  ?  Comment: occasionally  ? Drug use: No  ? Sexual activity: Never  ?  Partners: Male  ?Other Topics Concern  ? Not on file  ?Social History Narrative  ? Not on file  ? ?Social Determinants of Health  ? ?Financial Resource Strain: Not on file  ?Food Insecurity: Not on file  ?Transportation Needs: Not on file  ?Physical Activity: Not on file  ?Stress: Not on file  ?Social Connections: Not on file  ? ? ?Family History  ?Problem Relation Age of Onset  ? Cancer Mother   ? Colon cancer Mother   ? Diabetes Mother   ? Hypertension Father   ? Thyroid disease Father   ?  Thyroid cancer Father   ? Diabetes Father   ? Esophageal cancer Father   ? Breast cancer Maternal Aunt   ? Colon cancer Maternal Aunt   ? Breast cancer Maternal Aunt   ? Colon cancer Maternal Grandmother   ? Stroke Maternal Grandfather   ? Stomach cancer Paternal Grandfather   ? Colon cancer Son   ? Colon polyps Neg Hx   ? Rectal cancer Neg Hx   ? ? ?Outpatient Encounter Medications as of 09/12/2021  ?Medication Sig  ? ACCU-CHEK AVIVA PLUS test strip Inject 1 strip as directed daily.  ? acetaminophen (TYLENOL) 500 MG tablet Take 500 mg by mouth every 6 (six) hours as needed for mild pain or headache. As needed.  ? aspirin 81 MG EC tablet Take 325 mg by mouth daily.  ? Calcium Carb-Cholecalciferol (CALCIUM 600-D PO) Take 1 tablet by mouth 2 (two) times a week.  ? dicyclomine (BENTYL) 10 MG capsule Take 1 capsule (10 mg total) by mouth every 8 (eight) hours as needed for spasms. (Patient not taking: Reported on 07/19/2021)  ? EPINEPHrine 0.3 mg/0.3 mL IJ SOAJ injection  Inject 0.3 mg into the muscle as needed for anaphylaxis.  ? ferrous sulfate 325 (65 FE) MG tablet Take 325 mg by mouth daily with breakfast.  ? glimepiride (AMARYL) 4 MG tablet Take 2 mg by mouth 2 (two) times daily.  ? HUMALOG KWIKPEN 100 UNIT/ML KwikPen Inject 5-11 Units into the skin 3 (three) times daily with meals.  ? Lancets Thin MISC Inject 1 Stick into the skin daily.  ? levothyroxine (SYNTHROID) 50 MCG tablet Take 50 mcg by mouth daily.  ? Multiple Vitamins-Minerals (MULTIVITAMIN ADULT) CHEW Chew 2 tablets by mouth daily at 12 noon.  ? NOVOFINE PEN NEEDLE 32G X 6 MM MISC Inject into the skin.  ? olmesartan-hydrochlorothiazide (BENICAR HCT) 20-12.5 MG tablet Take 1 tablet by mouth daily.  ? OVER THE COUNTER MEDICATION in the morning and at bedtime. Vit D 3(2000)  once two times per day  ? potassium chloride (KLOR-CON M) 10 MEQ tablet Take 10 mEq by mouth daily.  ? simvastatin (ZOCOR) 20 MG tablet Take 20 mg by mouth at bedtime.  ? TRESIBA FLEXTOUCH 200 UNIT/ML FlexTouch Pen Inject 40 Units into the skin at bedtime.  ? ?No facility-administered encounter medications on file as of 09/12/2021.  ? ? ?ALLERGIES: ?Allergies  ?Allergen Reactions  ? Corn Oil Rash  ? Other Rash  ?  PT STATES SHE IS ALLERGIC TO MOST FOODS-STATES IT WAS FOUND IN ALLERGY TESTING  ? Peanut Allergen Powder-Dnfp Rash  ? Peanut-Containing Drug Products Rash  ? Penicillins Rash  ?  Has patient had a PCN reaction causing immediate rash, facial/tongue/throat swelling, SOB or lightheadedness with hypotension: Yes ?Has patient had a PCN reaction causing severe rash involving mucus membranes or skin necrosis: No ?Has patient had a PCN reaction that required hospitalization No ?Has patient had a PCN reaction occurring within the last 10 years: No. Has not had in 40-50 years ?If all of the above answers are "NO", then may proceed with Cephalosporin use.  ? ? ?VACCINATION STATUS: ?Immunization History  ?Administered Date(s) Administered  ? Moderna  Sars-Covid-2 Vaccination 07/11/2019, 08/08/2019, 08/11/2020  ? ? ?Diabetes ?She presents for her follow-up diabetic visit. She has type 2 diabetes mellitus. Onset time: Diagnosed at approx age of 66. Her disease course has been stable. There are no hypoglycemic associated symptoms. Pertinent negatives for hypoglycemia include no sweats or tremors. Associated symptoms  include fatigue, foot paresthesias, polydipsia and polyuria. There are no hypoglycemic complications. Symptoms are stable. Diabetic complications include a CVA, heart disease, nephropathy and peripheral neuropathy. Risk factors for coronary artery disease include diabetes mellitus, dyslipidemia, family history, obesity, hypertension, post-menopausal and sedentary lifestyle. Current diabetic treatment includes intensive insulin program and oral agent (monotherapy). She is compliant with treatment most of the time. Her weight is increasing steadily (gained 60+ lbs in last 6 months). She is following a generally healthy diet. When asked about meal planning, she reported none. She has not had a previous visit with a dietitian. She rarely participates in exercise. Her home blood glucose trend is fluctuating minimally. Her breakfast blood glucose range is generally >200 mg/dl. Her lunch blood glucose range is generally >200 mg/dl. Her dinner blood glucose range is generally >200 mg/dl. Her bedtime blood glucose range is generally >200 mg/dl. Her overall blood glucose range is >200 mg/dl. (She presents today, accompanied by her husband, with her CGM and logs showing persistent hyperglycemia overall.  She was not due for another A1c today.  She is frustrated that her glucose has not been controlled when she has drastically changed her diet.  Analysis of her CGM shows TIR 0%, TAR 100%, TBR 0% with a GMI of 11.5%.  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  She complains of  left arm and leg numbness, talked with her PCP about it who did not seem too concerned.  I did let her know that if it worsens, she needs to go to the ED for evaluation.) An ACE inhibitor/angiotensin II receptor bl

## 2021-09-12 NOTE — Patient Instructions (Signed)
Diabetes Mellitus and Foot Care Foot care is an important part of your health, especially when you have diabetes. Diabetes may cause you to have problems because of poor blood flow (circulation) to your feet and legs, which can cause your skin to: Become thinner and drier. Break more easily. Heal more slowly. Peel and crack. You may also have nerve damage (neuropathy) in your legs and feet, causing decreased feeling in them. This means that you may not notice minor injuries to your feet that could lead to more serious problems. Noticing and addressing any potential problems early is the best way to prevent future foot problems. How to care for your feet Foot hygiene  Wash your feet daily with warm water and mild soap. Do not use hot water. Then, pat your feet and the areas between your toes until they are completely dry. Do not soak your feet as this can dry your skin. Trim your toenails straight across. Do not dig under them or around the cuticle. File the edges of your nails with an emery board or nail file. Apply a moisturizing lotion or petroleum jelly to the skin on your feet and to dry, brittle toenails. Use lotion that does not contain alcohol and is unscented. Do not apply lotion between your toes. Shoes and socks Wear clean socks or stockings every day. Make sure they are not too tight. Do not wear knee-high stockings since they may decrease blood flow to your legs. Wear shoes that fit properly and have enough cushioning. Always look in your shoes before you put them on to be sure there are no objects inside. To break in new shoes, wear them for just a few hours a day. This prevents injuries on your feet. Wounds, scrapes, corns, and calluses  Check your feet daily for blisters, cuts, bruises, sores, and redness. If you cannot see the bottom of your feet, use a mirror or ask someone for help. Do not cut corns or calluses or try to remove them with medicine. If you find a minor scrape,  cut, or break in the skin on your feet, keep it and the skin around it clean and dry. You may clean these areas with mild soap and water. Do not clean the area with peroxide, alcohol, or iodine. If you have a wound, scrape, corn, or callus on your foot, look at it several times a day to make sure it is healing and not infected. Check for: Redness, swelling, or pain. Fluid or blood. Warmth. Pus or a bad smell. General tips Do not cross your legs. This may decrease blood flow to your feet. Do not use heating pads or hot water bottles on your feet. They may burn your skin. If you have lost feeling in your feet or legs, you may not know this is happening until it is too late. Protect your feet from hot and cold by wearing shoes, such as at the beach or on hot pavement. Schedule a complete foot exam at least once a year (annually) or more often if you have foot problems. Report any cuts, sores, or bruises to your health care provider immediately. Where to find more information American Diabetes Association: www.diabetes.org Association of Diabetes Care & Education Specialists: www.diabeteseducator.org Contact a health care provider if: You have a medical condition that increases your risk of infection and you have any cuts, sores, or bruises on your feet. You have an injury that is not healing. You have redness on your legs or feet. You   feel burning or tingling in your legs or feet. You have pain or cramps in your legs and feet. Your legs or feet are numb. Your feet always feel cold. You have pain around any toenails. Get help right away if: You have a wound, scrape, corn, or callus on your foot and: You have pain, swelling, or redness that gets worse. You have fluid or blood coming from the wound, scrape, corn, or callus. Your wound, scrape, corn, or callus feels warm to the touch. You have pus or a bad smell coming from the wound, scrape, corn, or callus. You have a fever. You have a red  line going up your leg. Summary Check your feet every day for blisters, cuts, bruises, sores, and redness. Apply a moisturizing lotion or petroleum jelly to the skin on your feet and to dry, brittle toenails. Wear shoes that fit properly and have enough cushioning. If you have foot problems, report any cuts, sores, or bruises to your health care provider immediately. Schedule a complete foot exam at least once a year (annually) or more often if you have foot problems. This information is not intended to replace advice given to you by your health care provider. Make sure you discuss any questions you have with your health care provider. Document Revised: 11/06/2019 Document Reviewed: 11/06/2019 Elsevier Patient Education  2023 [employer] Inc.  

## 2021-09-20 ENCOUNTER — Other Ambulatory Visit: Payer: Self-pay | Admitting: Nurse Practitioner

## 2021-09-20 ENCOUNTER — Ambulatory Visit (INDEPENDENT_AMBULATORY_CARE_PROVIDER_SITE_OTHER): Payer: Medicare Other | Admitting: Family Medicine

## 2021-09-20 ENCOUNTER — Encounter: Payer: Self-pay | Admitting: Family Medicine

## 2021-09-20 VITALS — BP 132/88 | HR 88 | Ht 60.0 in | Wt 272.0 lb

## 2021-09-20 DIAGNOSIS — N3281 Overactive bladder: Secondary | ICD-10-CM | POA: Diagnosis not present

## 2021-09-20 DIAGNOSIS — R609 Edema, unspecified: Secondary | ICD-10-CM

## 2021-09-20 DIAGNOSIS — B351 Tinea unguium: Secondary | ICD-10-CM | POA: Diagnosis not present

## 2021-09-20 DIAGNOSIS — I10 Essential (primary) hypertension: Secondary | ICD-10-CM

## 2021-09-20 DIAGNOSIS — R635 Abnormal weight gain: Secondary | ICD-10-CM

## 2021-09-20 DIAGNOSIS — Z794 Long term (current) use of insulin: Secondary | ICD-10-CM

## 2021-09-20 DIAGNOSIS — E1100 Type 2 diabetes mellitus with hyperosmolarity without nonketotic hyperglycemic-hyperosmolar coma (NKHHC): Secondary | ICD-10-CM | POA: Diagnosis not present

## 2021-09-20 DIAGNOSIS — E559 Vitamin D deficiency, unspecified: Secondary | ICD-10-CM

## 2021-09-20 MED ORDER — TRESIBA FLEXTOUCH 200 UNIT/ML ~~LOC~~ SOPN: 55.0000 [IU] | PEN_INJECTOR | Freq: Every evening | SUBCUTANEOUS | 3 refills | Status: DC

## 2021-09-20 NOTE — Assessment & Plan Note (Signed)
-  reports 20 years history of right toenail fungus -patient is unsure of the topical cream she was prescribed for the infection -medical records requested to review medications -will consider oral treatment if liver counts are normal with failed topical antifungal treatment.

## 2021-09-20 NOTE — Assessment & Plan Note (Signed)
Recommend conservative managements to help decrease swelling in the lower extremities -Reduce salt (sodium) in your diet -- Sodium, which is found in table salt and processed foods, can worsen edema. Reducing the amount of salt you consume can help to reduce edema, especially if you also take a diuretic.  -Compression stockings -- Leg edema can be prevented and treated with the use of compression stockings. Stockings are available in several heights, including knee-high, thigh-high, and pantyhose. Knee-high stockings are sufficient for most patients. - Body positioning -- Leg, ankle, and foot edema can be improved by elevating the legs above heart level for 30 minutes three or four times per day. Elevating the legs may be sufficient to reduce or eliminate edema

## 2021-09-20 NOTE — Progress Notes (Signed)
New Patient Office Visit  Subjective:  Patient ID: Jessica Strong, female    DOB: [birth year]  Age: 76 y.o. MRN: [id number]  CC:  Chief Complaint  Patient presents with   New Patient (Initial Visit)    Establishing care, would like a general work up. Has weight concerns.    HPI Jessica Strong is a 76 y.o. female with past medical history of t2dm presents for establishing care. T2DM: started on insulin 3 months ago. Highest BS is >300, and the lowest is in the 190s. She checks her blood sugar 4 times daily and is on a sliding scale and bedtime insulin; she follows up with the endocrinologist who manages her treatment. Her last visit with the endocrinologist was on 09/12/21. Her tresiba was increased to 55 units nightly, and her Humalog was increased to 10-16 units TID with plans to consider incretin therapy at her next visit on October 18, 2021. Weight gain: reports gaining 10 lbs in a week. She has been eating healthier with minimum physical activities due to swelling of her lower legs and pain with ambulation. Toenail fungus: reports having a fungal infection of the right great toe for over 20 years. Has tried topical treatment with minimum relief.   Overactive bladder: onset in 2019. She has taken beta-3 adrenergic agonists and had bladder surgery with minimum relief. She reports no longer taking mirabegron.    Past Medical History:  Diagnosis Date   Allergy    Anemia    Anxiety    Arthritis    Cataract    cervical ca 1990   Depression    Diabetes ([place])    Hypercholesterolemia    Hypertension    Myocardial infarction ([place])    Sleep apnea    on CPAP 14 mm PS since February 2017   Stroke [facility])    weakness on R side   Vitamin D deficiency     Past Surgical History:  Procedure Laterality Date   ABDOMINAL HYSTERECTOMY     BARIATRIC SURGERY     BIOPSY  09/17/2020   Procedure: BIOPSY;  Surgeon: Harvel Quale, MD;  [facility];  Service: Gastroenterology;;    CARDIAC CATHETERIZATION N/A 12/24/2015   Procedure: Right/Left Heart Cath and Coronary Angiography;  Surgeon: Jettie Booze, MD;  Location: [place] [facility];  Service: Cardiovascular;  Laterality: N/A;   COLONOSCOPY  2018   COLONOSCOPY WITH PROPOFOL N/A 09/17/2020   Procedure: COLONOSCOPY WITH PROPOFOL;  Surgeon: Harvel Quale, MD;  [facility];  Service: Gastroenterology;  Laterality: N/A;  9:00   ESOPHAGOGASTRODUODENOSCOPY (EGD) WITH PROPOFOL N/A 09/17/2020   Procedure: ESOPHAGOGASTRODUODENOSCOPY (EGD) WITH PROPOFOL;  Surgeon: Harvel Quale, MD;  [facility];  Service: Gastroenterology;  Laterality: N/A;   HERNIA REPAIR     LEFT HEART CATH AND CORONARY ANGIOGRAPHY N/A 07/12/2017   Procedure: LEFT HEART CATH AND CORONARY ANGIOGRAPHY;  Surgeon: Belva Crome, MD;  Location: [place] [facility];  Service: Cardiovascular;  Laterality: N/A;    Family History  Problem Relation Age of Onset   Cancer Mother    Colon cancer Mother    Diabetes Mother    Hypertension Father    Thyroid disease Father    Thyroid cancer Father    Diabetes Father    Esophageal cancer Father    Breast cancer Maternal Aunt    Colon cancer Maternal Aunt    Breast cancer Maternal Aunt    Colon cancer Maternal  Grandmother    Stroke Maternal Grandfather    Stomach cancer Paternal Grandfather    Colon cancer Son    Colon polyps Neg Hx    Rectal cancer Neg Hx     Social History   Socioeconomic History   Marital status: Married    Spouse name: Not on file   Number of children: 2   Years of education: Not on file   Highest education level: Not on file  Occupational History   Occupation: retired  Tobacco Use   Smoking status: Never   Smokeless tobacco: Never  Vaping Use   Vaping Use: Never used  Substance and Sexual Activity   Alcohol use: Yes    Alcohol/week: 0.0 standard drinks    Comment: occasionally   Drug use: No   Sexual activity: Never     Partners: Male  Other Topics Concern   Not on file  Social History Narrative   Not on file   Social Determinants of Health   Financial Resource Strain: Not on file  Food Insecurity: Not on file  Transportation Needs: Not on file  Physical Activity: Not on file  Stress: Not on file  Social Connections: Not on file  Intimate Partner Violence: Not on file    ROS Review of Systems  Objective:   Today's Vitals: BP 132/88   Pulse 88   Ht 5' (1.524 m)   Wt 272 lb (123.4 kg)   SpO2 98%   BMI 53.12 kg/m   Physical Exam HENT:     Head: Normocephalic.     Right Ear: External ear normal.     Left Ear: External ear normal.     Mouth/Throat:     Mouth: Mucous membranes are moist.  Eyes:     General:        Right eye: No discharge.        Left eye: No discharge.     Extraocular Movements: Extraocular movements intact.     Pupils: Pupils are equal, round, and reactive to light.  Cardiovascular:     Rate and Rhythm: Normal rate and regular rhythm.     Pulses: Normal pulses.     Heart sounds: Normal heart sounds.  Pulmonary:     Effort: Pulmonary effort is normal.     Breath sounds: Normal breath sounds.  Abdominal:     Palpations: Abdomen is soft.  Musculoskeletal:     Cervical back: No rigidity or tenderness.     Right lower leg: Tenderness (palpation) present. 2+ Edema present.     Left lower leg: Tenderness (palpation) present. 2+ Edema present.     Comments: Thickened yellow discoloration of the great big toenail  Skin:    General: Skin is warm.     Capillary Refill: Capillary refill takes less than 2 seconds.     Findings: No bruising or erythema.  Neurological:     Mental Status: She is alert and oriented to person, place, and time.     Comments: Has trouble remembering certain details   Psychiatric:     Comments: Normal affect    Assessment & Plan:   Problem List Items Addressed This Visit       Cardiovascular and Mediastinum   Essential hypertension     controlled continue BENICAR HCT         Endocrine   DM2 (diabetes mellitus, type 2) ([place]) - Primary    -started insulin 3 months ago and is following up with her endocrinologist -tresiba was  increased to 55 units nightly, and her Humalog was increased to 10-16 units TID with plans to consider incretin therapy at her next visit on October 18, 2021.  Lab Results  Component Value Date   HGBA1C 9.1 07/11/2021             Relevant Orders   CBC with Differential/Platelet   CMP14+EGFR   TSH + free T4   Lipid panel     Musculoskeletal and Integument   Onychomycosis of right great toe    -reports 20 years history of right toenail fungus -patient is unsure of the topical cream she was prescribed for the infection -medical records requested to review medications -will consider oral treatment if liver counts are normal with failed topical antifungal treatment.         Other   Weight gain    -reports gaining 10 lbs in a week -weight gain could be attributed to the patient's use of insulin and glimepiride, given that weight gain is one of the side effects of sulfonylureas -encouraged the patient to discuss starting ozempic with her endocrinologist, given that it lowers hgAlc and helps with wt loss         Peripheral edema     Recommend conservative managements to help decrease swelling in the lower extremities -Reduce salt (sodium) in your diet -- Sodium, which is found in table salt and processed foods, can worsen edema. Reducing the amount of salt you consume can help to reduce edema, especially if you also take a diuretic.  -Compression stockings -- Leg edema can be prevented and treated with the use of compression stockings. Stockings are available in several heights, including knee-high, thigh-high, and pantyhose. Knee-high stockings are sufficient for most patients. - Body positioning -- Leg, ankle, and foot edema can be improved by elevating the legs above heart level for 30  minutes three or four times per day. Elevating the legs may be sufficient to reduce or eliminate edema      Other Visit Diagnoses     Overactive bladder       Relevant Orders   Ambulatory referral to Urology   Vitamin D deficiency       Relevant Orders   Vitamin D (25 hydroxy)       Outpatient Encounter Medications as of 09/20/2021  Medication Sig   acetaminophen (TYLENOL) 500 MG tablet Take 500 mg by mouth every 6 (six) hours as needed for mild pain or headache. As needed.   aspirin 81 MG EC tablet Take 325 mg by mouth daily.   Calcium Carb-Cholecalciferol (CALCIUM 600-D PO) Take 1 tablet by mouth 2 (two) times a week.   EPINEPHrine 0.3 mg/0.3 mL IJ SOAJ injection Inject 0.3 mg into the muscle as needed for anaphylaxis.   ferrous sulfate 325 (65 FE) MG tablet Take 325 mg by mouth daily with breakfast.   glimepiride (AMARYL) 4 MG tablet Take 0.5 mg by mouth 2 (two) times daily.   HUMALOG KWIKPEN 100 UNIT/ML KwikPen Inject 5-11 Units into the skin 3 (three) times daily with meals.   levothyroxine (SYNTHROID) 50 MCG tablet Take 50 mcg by mouth daily.   Multiple Vitamins-Minerals (MULTIVITAMIN ADULT) CHEW Chew 2 tablets by mouth daily at 12 noon.   NOVOFINE PEN NEEDLE 32G X 6 MM MISC Inject into the skin.   olmesartan-hydrochlorothiazide (BENICAR HCT) 20-12.5 MG tablet Take 1 tablet by mouth daily.   OVER THE COUNTER MEDICATION in the morning and at bedtime. Vit D 3(2000)  once two  times per day   potassium chloride (KLOR-CON M) 10 MEQ tablet Take 10 mEq by mouth daily.   [DISCONTINUED] TRESIBA FLEXTOUCH 200 UNIT/ML FlexTouch Pen Inject 55 Units into the skin at bedtime.   ACCU-CHEK AVIVA PLUS test strip Inject 1 strip as directed daily. (Patient not taking: Reported on 09/20/2021)   dicyclomine (BENTYL) 10 MG capsule Take 1 capsule (10 mg total) by mouth every 8 (eight) hours as needed for spasms. (Patient not taking: Reported on 09/20/2021)   Lancets Thin MISC Inject 1 Stick into the  skin daily. (Patient not taking: Reported on 09/20/2021)   simvastatin (ZOCOR) 20 MG tablet Take 20 mg by mouth at bedtime. (Patient not taking: Reported on 09/20/2021)   No facility-administered encounter medications on file as of 09/20/2021.    Follow-up: No follow-ups on file.   Alvira Monday, FNP

## 2021-09-20 NOTE — Progress Notes (Signed)
Patient called after hours line saying she did not have enough basal insulin for tonights dose.  Called to request refill.  I sent in the prescription for increased amount to the pharmacy on file.

## 2021-09-20 NOTE — Assessment & Plan Note (Signed)
controlled continue BENICAR HCT

## 2021-09-20 NOTE — Assessment & Plan Note (Addendum)
-  reports gaining 10 lbs in a week -weight gain could be attributed to the patient's use of insulin and glimepiride, given that weight gain is one of the side effects of sulfonylureas -encouraged the patient to discuss starting ozempic with her endocrinologist, given that it lowers hgAlc and helps with wt loss

## 2021-09-20 NOTE — Assessment & Plan Note (Signed)
-  started insulin 3 months ago and is following up with her endocrinologist -tresiba was increased to 55 units nightly, and her Humalog was increased to 10-16 units TID with plans to consider incretin therapy at her next visit on October 18, 2021.  Lab Results  Component Value Date   HGBA1C 9.1 07/11/2021

## 2021-09-20 NOTE — Patient Instructions (Addendum)
I appreciate the opportunity to provide care to you today!    Follow up:  2 months  Labs: next appointment  Referrals today- urology   Please continue to a heart-healthy diet and increase your physical activities. Try to exercise for 101mns at least three times a week.      It was a pleasure to see you and I look forward to continuing to work together on your health and well-being. Please do not hesitate to call the office if you need care or have questions about your care.   Have a wonderful day and week. With Gratitude, GAlvira MondayMSN, FNP-BC

## 2021-09-21 LAB — GAD65, IA-2, AND INSULIN AUTOANTIBODY SERUM
Glutamic Acid Decarb Ab: <5 IU/mL (ref <5)
IA-2 Antibody: <5.4 U/mL (ref <5.4)
Insulin Antibodies, Human: 0.7 U/mL — ABNORMAL HIGH (ref <0.4)

## 2021-09-21 LAB — C-PEPTIDE: C-Peptide: 4.48 ng/mL — ABNORMAL HIGH (ref 0.80–3.85)

## 2021-10-03 ENCOUNTER — Ambulatory Visit: Payer: Medicare Other | Admitting: Urology

## 2021-10-03 ENCOUNTER — Encounter: Payer: Self-pay | Admitting: Urology

## 2021-10-03 VITALS — BP 156/70 | HR 72 | Ht 60.0 in | Wt 278.0 lb

## 2021-10-03 DIAGNOSIS — N3281 Overactive bladder: Secondary | ICD-10-CM | POA: Diagnosis not present

## 2021-10-03 LAB — URINALYSIS, ROUTINE W REFLEX MICROSCOPIC
Bilirubin, UA: NEGATIVE
Ketones, UA: NEGATIVE
Leukocytes,UA: NEGATIVE
Nitrite, UA: NEGATIVE
Protein,UA: NEGATIVE
Specific Gravity, UA: 1.02 (ref 1.005–1.030)
Urobilinogen, Ur: 0.2 mg/dL (ref 0.2–1.0)
pH, UA: 5.5 (ref 5.0–7.5)

## 2021-10-03 LAB — MICROSCOPIC EXAMINATION: Renal Epithel, UA: NONE SEEN /hpf

## 2021-10-03 LAB — BLADDER SCAN AMB NON-IMAGING: Scan Result: 5

## 2021-10-03 NOTE — Progress Notes (Unsigned)
post void residual =5mL 

## 2021-10-03 NOTE — Progress Notes (Signed)
10/03/2021 1:57 PM   [place] 08/30/1945 [id number]  Referring provider: Alvira Monday, [address] [place],  [place] [address]  No chief complaint on file.   HPI:  New patient-  1) urinary frequency-she has frequency and urgency. She has incontinence. She wears pads and goes through several per day and night. She s/p caldera sling on 01/05/2018 with Dr. Carlota Raspberry. No change in incontinence. Maybe worse. She leaks with urgency. Not so much with stress. No gross hematuria. She tried Myrbetriq. 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 She does have constipation.  CT abdomen and pelvis March 2023 benign GU tract.  Punctate left renal stone.  UA clear, PVR 5 ml.   Today, seen for the above.    PMH: Past Medical History:  Diagnosis Date   Allergy    Anemia    Anxiety    Arthritis    Cataract    cervical ca 1990   Depression    Diabetes ([place])    Hypercholesterolemia    Hypertension    Myocardial infarction ([place])    Sleep apnea    on CPAP 14 mm PS since February 2017   Stroke [facility])    weakness on R side   Vitamin D deficiency     Surgical History: Past Surgical History:  Procedure Laterality Date   ABDOMINAL HYSTERECTOMY     BARIATRIC SURGERY     BIOPSY  09/17/2020   Procedure: BIOPSY;  Surgeon: Harvel Quale, MD;  [facility];  Service: Gastroenterology;;   CARDIAC CATHETERIZATION N/A 12/24/2015   Procedure: Right/Left Heart Cath and Coronary Angiography;  Surgeon: Jettie Booze, MD;  Location: [place] [facility];  Service: Cardiovascular;  Laterality: N/A;   COLONOSCOPY  2018   COLONOSCOPY WITH PROPOFOL N/A 09/17/2020   Procedure: COLONOSCOPY WITH PROPOFOL;  Surgeon: Harvel Quale, MD;  [facility];  Service: Gastroenterology;  Laterality: N/A;  9:00   ESOPHAGOGASTRODUODENOSCOPY (EGD) WITH PROPOFOL N/A 09/17/2020   Procedure: ESOPHAGOGASTRODUODENOSCOPY (EGD) WITH PROPOFOL;  Surgeon: Harvel Quale, MD;   [facility];  Service: Gastroenterology;  Laterality: N/A;   HERNIA REPAIR     LEFT HEART CATH AND CORONARY ANGIOGRAPHY N/A 07/12/2017   Procedure: LEFT HEART CATH AND CORONARY ANGIOGRAPHY;  Surgeon: Belva Crome, MD;  Location: [place] [facility];  Service: Cardiovascular;  Laterality: N/A;    Home Medications:  Allergies as of 10/03/2021       Reactions   Corn Oil Rash   Other Rash   PT STATES SHE IS ALLERGIC TO MOST FOODS-STATES IT WAS FOUND IN ALLERGY TESTING   Peanut Allergen Powder-dnfp Rash   Peanut-containing Drug Products Rash   Penicillins Rash   Has patient had a PCN reaction causing immediate rash, facial/tongue/throat swelling, SOB or lightheadedness with hypotension: Yes Has patient had a PCN reaction causing severe rash involving mucus membranes or skin necrosis: No Has patient had a PCN reaction that required hospitalization No Has patient had a PCN reaction occurring within the last 10 years: No. Has not had in 40-50 years If all of the above answers are "NO", then may proceed with Cephalosporin use.        Medication List        Accurate as of October 03, 2021  1:57 PM. If you have any questions, ask your nurse or doctor.          Accu-Chek Aviva Plus test strip Generic drug: glucose  blood Inject 1 strip as directed daily.   acetaminophen 500 MG tablet Commonly known as: TYLENOL Take 500 mg by mouth every 6 (six) hours as needed for mild pain or headache. As needed.   aspirin EC 81 MG tablet Take 325 mg by mouth daily.   CALCIUM 600-D PO Take 1 tablet by mouth 2 (two) times a week.   dicyclomine 10 MG capsule Commonly known as: Bentyl Take 1 capsule (10 mg total) by mouth every 8 (eight) hours as needed for spasms.   EPINEPHrine 0.3 mg/0.3 mL Soaj injection Commonly known as: EPI-PEN Inject 0.3 mg into the muscle as needed for anaphylaxis.   ferrous sulfate 325 (65 FE) MG tablet Take 325 mg by mouth daily with breakfast.    glimepiride 4 MG tablet Commonly known as: AMARYL Take 0.5 mg by mouth 2 (two) times daily.   HumaLOG KwikPen 100 UNIT/ML KwikPen Generic drug: insulin lispro Inject 5-11 Units into the skin 3 (three) times daily with meals.   Lancets Thin Misc Inject 1 Stick into the skin daily.   levothyroxine 50 MCG tablet Commonly known as: SYNTHROID Take 50 mcg by mouth daily.   Multivitamin Adult Chew Chew 2 tablets by mouth daily at 12 noon.   Novofine Pen Needle 32G X 6 MM Misc Generic drug: Insulin Pen Needle Inject into the skin.   olmesartan-hydrochlorothiazide 20-12.5 MG tablet Commonly known as: Benicar HCT Take 1 tablet by mouth daily.   OVER THE COUNTER MEDICATION in the morning and at bedtime. Vit D 3(2000)  once two times per day   potassium chloride 10 MEQ tablet Commonly known as: KLOR-CON M Take 10 mEq by mouth daily.   simvastatin 20 MG tablet Commonly known as: ZOCOR Take 20 mg by mouth at bedtime.   Tyler Aas FlexTouch 200 UNIT/ML FlexTouch Pen Generic drug: insulin degludec Inject 56 Units into the skin at bedtime.        Allergies:  Allergies  Allergen Reactions   Corn Oil Rash   Other Rash    PT STATES SHE IS ALLERGIC TO MOST FOODS-STATES IT WAS FOUND IN ALLERGY TESTING   Peanut Allergen Powder-Dnfp Rash   Peanut-Containing Drug Products Rash   Penicillins Rash    Has patient had a PCN reaction causing immediate rash, facial/tongue/throat swelling, SOB or lightheadedness with hypotension: Yes Has patient had a PCN reaction causing severe rash involving mucus membranes or skin necrosis: No Has patient had a PCN reaction that required hospitalization No Has patient had a PCN reaction occurring within the last 10 years: No. Has not had in 40-50 years If all of the above answers are "NO", then may proceed with Cephalosporin use.    Family History: Family History  Problem Relation Age of Onset   Cancer Mother    Colon cancer Mother    Diabetes  Mother    Hypertension Father    Thyroid disease Father    Thyroid cancer Father    Diabetes Father    Esophageal cancer Father    Breast cancer Maternal Aunt    Colon cancer Maternal Aunt    Breast cancer Maternal Aunt    Colon cancer Maternal Grandmother    Stroke Maternal Grandfather    Stomach cancer Paternal Grandfather    Colon cancer Son    Colon polyps Neg Hx    Rectal cancer Neg Hx     Social History:  reports that she has never smoked. She has never used smokeless tobacco. She reports current alcohol  use. She reports that she does not use drugs.   Physical Exam: BP (!) 156/70   Pulse 72   Ht 5' (1.524 m)   Wt 278 lb (126.1 kg)   BMI 54.29 kg/m   Constitutional:  Alert and oriented, No acute distress. HEENT: [place] AT, moist mucus membranes.  Trachea midline, no masses. Cardiovascular: No clubbing, cyanosis, or edema. Respiratory: Normal respiratory effort, no increased work of breathing. GI: Abdomen is soft, nontender, nondistended, no abdominal masses GU: No CVA tenderness Lymph: No cervical or inguinal lymphadenopathy. Skin: No rashes, bruises or suspicious lesions. Neurologic: Grossly intact, no focal deficits, moving all 4 extremities. Psychiatric: Normal mood and affect.  Laboratory Data: Lab Results  Component Value Date   WBC 9.3 09/29/2020   HGB 10.9 (L) 09/29/2020   HCT 35.2 (L) 09/29/2020   MCV 85.4 09/29/2020   PLT 275 09/29/2020    Lab Results  Component Value Date   CREATININE 1.80 (H) 07/21/2021    No results found for: PSA  No results found for: TESTOSTERONE  Lab Results  Component Value Date   HGBA1C 9.1 07/11/2021    Urinalysis No results found for: COLORURINE, APPEARANCEUR, LABSPEC, PHURINE, GLUCOSEU, HGBUR, BILIRUBINUR, KETONESUR, PROTEINUR, UROBILINOGEN, NITRITE, LEUKOCYTESUR  No results found for: LABMICR, [place], RBCUA, LABEPIT, MUCUS, BACTERIA  Pertinent Imaging:    Assessment & Plan:    1. Overactive bladder Disc  nature r/b of anticholinergics, Beta 3, PTNS and botox. See back to check bladder and urethra (prior sling). Trial of Gemtesa (given prior constipation) and if not we can try Vesicare. 3 weeks gemtesa given. Consider UDS in [place] and botox.   - BLADDER SCAN AMB NON-IMAGING - Urinalysis, Routine w reflex microscopic   No follow-ups on file.  Festus Aloe, MD  [facility]  [address] [place], [place] [address] [phone]

## 2021-10-09 NOTE — Progress Notes (Unsigned)
Clinical Summary Jessica Strong is a 76 y.o.female seen today for follow up of the following medical problems.    1. SOB/Chest pain - secondhand smoke exposure with husband. +cough at times. Occasional wheezing. Since last visit she had normal PFTs.  - echo 12/2012 [facility] Internal Medicine: technically difficult. LVEF 55-60%, abnormal diastolic function.  - negative exercise cardiolite in 2016 - 10/2015 echo LVEF 16-10%, grade I diastolic dysfunction  LHC/RHC in 11/2015. No significant CAD, normal filling pressures. PCWP 7, mean PA 15.    - 06/2017 [facility] wth chest pain.Mild trop elevation up to 0.19 (did not establish clear peak). Echo at that time LVEF 55-60%.  06/2017 nuclear stress: mild to moderate anterior ischemia, low to intermediate risk - 06/2017 cath normal coronaries.     - dull pain down entire arm, numbness. Can last several hours, not worst with postion.  - SOB has progressed. DOE walking room to room at home - some recent edema x few years that is progressing   2. HTN - compliant with meds   3. OSA - compliant with cpap  Past Medical History:  Diagnosis Date   Allergy    Anemia    Anxiety    Arthritis    Cataract    cervical ca 1990   Depression    Diabetes ([place])    Hypercholesterolemia    Hypertension    Myocardial infarction [facility])    Sleep apnea    on CPAP 14 mm PS since February 2017   Stroke [facility])    weakness on R side   Vitamin D deficiency      Allergies  Allergen Reactions   Corn Oil Rash   Other Rash    PT STATES SHE IS ALLERGIC TO MOST FOODS-STATES IT WAS FOUND IN ALLERGY TESTING   Peanut Allergen Powder-Dnfp Rash   Peanut-Containing Drug Products Rash   Penicillins Rash    Has patient had a PCN reaction causing immediate rash, facial/tongue/throat swelling, SOB or lightheadedness with hypotension: Yes Has patient had a PCN reaction causing severe rash involving mucus membranes or skin necrosis: No Has patient had a PCN reaction that  required hospitalization No Has patient had a PCN reaction occurring within the last 10 years: No. Has not had in 40-50 years If all of the above answers are "NO", then may proceed with Cephalosporin use.     Current Outpatient Medications  Medication Sig Dispense Refill   ACCU-CHEK AVIVA PLUS test strip Inject 1 strip as directed daily.  4   acetaminophen (TYLENOL) 500 MG tablet Take 500 mg by mouth every 6 (six) hours as needed for mild pain or headache. As needed.     aspirin 81 MG EC tablet Take 325 mg by mouth daily.     Calcium Carb-Cholecalciferol (CALCIUM 600-D PO) Take 1 tablet by mouth 2 (two) times a week.     dicyclomine (BENTYL) 10 MG capsule Take 1 capsule (10 mg total) by mouth every 8 (eight) hours as needed for spasms. 60 capsule 2   EPINEPHrine 0.3 mg/0.3 mL IJ SOAJ injection Inject 0.3 mg into the muscle as needed for anaphylaxis.     ferrous sulfate 325 (65 FE) MG tablet Take 325 mg by mouth daily with breakfast.     glimepiride (AMARYL) 4 MG tablet Take 0.5 mg by mouth 2 (two) times daily.     HUMALOG KWIKPEN 100 UNIT/ML KwikPen Inject 5-11 Units into the skin 3 (three) times daily with meals.  Lancets Thin MISC Inject 1 Stick into the skin daily.     levothyroxine (SYNTHROID) 50 MCG tablet Take 50 mcg by mouth daily.     Multiple Vitamins-Minerals (MULTIVITAMIN ADULT) CHEW Chew 2 tablets by mouth daily at 12 noon.     NOVOFINE PEN NEEDLE 32G X 6 MM MISC Inject into the skin.     olmesartan-hydrochlorothiazide (BENICAR HCT) 20-12.5 MG tablet Take 1 tablet by mouth daily. 30 tablet 11   OVER THE COUNTER MEDICATION in the morning and at bedtime. Vit D 3(2000)  once two times per day     potassium chloride (KLOR-CON M) 10 MEQ tablet Take 10 mEq by mouth daily.     simvastatin (ZOCOR) 20 MG tablet Take 20 mg by mouth at bedtime.     TRESIBA FLEXTOUCH 200 UNIT/ML FlexTouch Pen Inject 56 Units into the skin at bedtime. 45 mL 3   No current facility-administered  medications for this visit.     Past Surgical History:  Procedure Laterality Date   ABDOMINAL HYSTERECTOMY     BARIATRIC SURGERY     BIOPSY  09/17/2020   Procedure: BIOPSY;  Surgeon: Harvel Quale, MD;  [facility];  Service: Gastroenterology;;   CARDIAC CATHETERIZATION N/A 12/24/2015   Procedure: Right/Left Heart Cath and Coronary Angiography;  Surgeon: Jettie Booze, MD;  Location: [place] [facility];  Service: Cardiovascular;  Laterality: N/A;   COLONOSCOPY  2018   COLONOSCOPY WITH PROPOFOL N/A 09/17/2020   Procedure: COLONOSCOPY WITH PROPOFOL;  Surgeon: Harvel Quale, MD;  [facility];  Service: Gastroenterology;  Laterality: N/A;  9:00   ESOPHAGOGASTRODUODENOSCOPY (EGD) WITH PROPOFOL N/A 09/17/2020   Procedure: ESOPHAGOGASTRODUODENOSCOPY (EGD) WITH PROPOFOL;  Surgeon: Harvel Quale, MD;  [facility];  Service: Gastroenterology;  Laterality: N/A;   HERNIA REPAIR     LEFT HEART CATH AND CORONARY ANGIOGRAPHY N/A 07/12/2017   Procedure: LEFT HEART CATH AND CORONARY ANGIOGRAPHY;  Surgeon: Belva Crome, MD;  Location: [place] CV LAB;  Service: Cardiovascular;  Laterality: N/A;     Allergies  Allergen Reactions   Corn Oil Rash   Other Rash    PT STATES SHE IS ALLERGIC TO MOST FOODS-STATES IT WAS FOUND IN ALLERGY TESTING   Peanut Allergen Powder-Dnfp Rash   Peanut-Containing Drug Products Rash   Penicillins Rash    Has patient had a PCN reaction causing immediate rash, facial/tongue/throat swelling, SOB or lightheadedness with hypotension: Yes Has patient had a PCN reaction causing severe rash involving mucus membranes or skin necrosis: No Has patient had a PCN reaction that required hospitalization No Has patient had a PCN reaction occurring within the last 10 years: No. Has not had in 40-50 years If all of the above answers are "NO", then may proceed with Cephalosporin use.      Family History   Problem Relation Age of Onset   Cancer Mother    Colon cancer Mother    Diabetes Mother    Hypertension Father    Thyroid disease Father    Thyroid cancer Father    Diabetes Father    Esophageal cancer Father    Breast cancer Maternal Aunt    Colon cancer Maternal Aunt    Breast cancer Maternal Aunt    Colon cancer Maternal Grandmother    Stroke Maternal Grandfather    Stomach cancer Paternal Grandfather    Colon cancer Son    Colon polyps Neg Hx    Rectal cancer Neg  Hx      Social History Jessica Strong reports that she has never smoked. She has never used smokeless tobacco. Jessica Strong reports current alcohol use.   Review of Systems CONSTITUTIONAL: No weight loss, fever, chills, weakness or fatigue.  HEENT: Eyes: No visual loss, blurred vision, double vision or yellow sclerae.No hearing loss, sneezing, congestion, runny nose or sore throat.  SKIN: No rash or itching.  CARDIOVASCULAR: per hpi RESPIRATORY: No shortness of breath, cough or sputum.  GASTROINTESTINAL: No anorexia, nausea, vomiting or diarrhea. No abdominal pain or blood.  GENITOURINARY: No burning on urination, no polyuria NEUROLOGICAL: left arm pain/numbness MUSCULOSKELETAL: No muscle, back pain, joint pain or stiffness.  LYMPHATICS: No enlarged nodes. No history of splenectomy.  PSYCHIATRIC: No history of depression or anxiety.  ENDOCRINOLOGIC: No reports of sweating, cold or heat intolerance. No polyuria or polydipsia.  [name]   Physical Examination Today's Vitals   10/10/21 0933  BP: 130/70  Pulse: 68  SpO2: 99%  Weight: 273 lb (123.8 kg)  Height: 5' (1.524 m)   Body mass index is 53.32 kg/m.  Gen: resting comfortably, no acute distress HEENT: no scleral icterus, pupils equal round and reactive, no palptable cervical adenopathy,  CV: RRR, no m/r/g no jvd Resp: Clear to auscultation bilaterally GI: abdomen is soft, non-tender, non-distended, normal bowel sounds, no hepatosplenomegaly MSK: extremities  are warm, 1+ bilateral LE edema Skin: warm, no rash Neuro:  no focal deficits Psych: appropriate affect   Diagnostic Studies  10/2015 echo Study Conclusions   - Left ventricle: The cavity size was normal. Systolic function was   normal. The estimated ejection fraction was in the range of 60%   to 65%. Mild concentric and moderate focal basal septal   hypertrophy. Wall motion was normal; there were no regional wall   motion abnormalities. Doppler parameters are consistent with   abnormal left ventricular relaxation (grade 1 diastolic   dysfunction). - Aortic valve: Trileaflet; mildly thickened leaflets.   11/2015 Cath LV end diastolic pressure is normal. Normal right heart pressures. Low pulmonary artery saturation, 52%. CO 3.9 L/min; Cardiac index 2.0 No angiographically apparent coronary artery disease. Tortuous right innominate by selective angiogram.   Normal LVEF by echo.  Unclear etiology of low PA saturation.  No ventriculogram due to renal insufficiency.  Continue medical therapy, treatment of OSA and weight loss.   06/2017 nuclear stress There was no ST segment deviation noted during stress. The left ventricular ejection fraction is hyperdynamic (>65%). Findings consistent with mild to moderate anterior ischemia. Low to intermediate risk stress test.   06/2017 cath Normal coronary arteries. Normal left ventricular size and function with normal hemodynamics. False positive nuclear study   RECOMMENDATIONS:   No further cardiac ischemic evaluation.  Nuclear study is felt to be false positive.         Assessment and Plan   1. Chest pain/SOB - extensive history of symptoms. Extensive cardiac testing in 2017 and 2019 have been benign, including caths with normal arteries and filling pressures. Prior RHC without any significant pulm HTN - prior PFTs were benign, no major findings on CXR -recent progression of SOB, LE edema. Will repeat echo - stop benicar-hct, start  benicar '20mg'$  daily with lasix 20 mg daily.  Upcoming labs with pcp   2. Left arm pain - not cardiac in description, perhaps some cervical radiculopathy. Defer to primary for futher workup.        [name], M.D.

## 2021-10-10 ENCOUNTER — Ambulatory Visit: Payer: Medicare Other | Admitting: Cardiology

## 2021-10-10 ENCOUNTER — Encounter: Payer: Self-pay | Admitting: Cardiology

## 2021-10-10 VITALS — BP 130/70 | HR 68 | Ht 60.0 in | Wt 273.0 lb

## 2021-10-10 DIAGNOSIS — R0602 Shortness of breath: Secondary | ICD-10-CM | POA: Diagnosis not present

## 2021-10-10 MED ORDER — OLMESARTAN MEDOXOMIL 20 MG PO TABS: 20.0000 mg | ORAL_TABLET | Freq: Every day | ORAL | 6 refills | Status: DC

## 2021-10-10 MED ORDER — FUROSEMIDE 20 MG PO TABS: 20.0000 mg | ORAL_TABLET | Freq: Every day | ORAL | 6 refills | Status: DC

## 2021-10-10 NOTE — Patient Instructions (Signed)
Medication Instructions:  Stop Benicar / HCTZ  Begin Benicar '20mg'$  daily Begin Lasix '20mg'$  daily  Continue all other medications.     Labwork: none  Testing/Procedures: Your physician has requested that you have an echocardiogram. Echocardiography is a painless test that uses sound waves to create images of your heart. It provides your doctor with information about the size and shape of your heart and how well your heart's chambers and valves are working. This procedure takes approximately one hour. There are no restrictions for this procedure.  Office will contact with results via phone or letter.     Follow-Up: 3 months   Any Other Special Instructions Will Be Listed Below (If Applicable).   If you need a refill on your cardiac medications before your next appointment, please call your pharmacy.

## 2021-10-18 ENCOUNTER — Ambulatory Visit: Payer: Medicare Other | Admitting: Nurse Practitioner

## 2021-10-18 LAB — HM DIABETES EYE EXAM

## 2021-10-24 ENCOUNTER — Ambulatory Visit (INDEPENDENT_AMBULATORY_CARE_PROVIDER_SITE_OTHER): Payer: Medicare Other

## 2021-10-24 DIAGNOSIS — Z0000 Encounter for general adult medical examination without abnormal findings: Secondary | ICD-10-CM

## 2021-10-24 DIAGNOSIS — N951 Menopausal and female climacteric states: Secondary | ICD-10-CM | POA: Diagnosis not present

## 2021-10-24 NOTE — Progress Notes (Signed)
Subjective:   Jessica Strong is a 76 y.o. female who presents for an Initial Medicare Annual Wellness Visit. I connected with  [name] on 10/24/21 by a audio enabled telemedicine application and verified that I am speaking with the correct person using two identifiers.  Patient Location: Home  Provider Location: Office/Clinic  I discussed the limitations of evaluation and management by telemedicine. The patient expressed understanding and agreed to proceed.  Review of Systems           Objective:    There were no vitals filed for this visit. There is no height or weight on file to calculate BMI.     09/17/2020    9:09 AM 09/15/2020    9:38 AM 07/12/2017    9:05 AM 02/12/2017   10:27 AM 12/24/2015    9:20 AM  Advanced Directives  Does Patient Have a Medical Advance Directive? Yes Yes Yes Yes Yes  Type of Advance Directive Living will Healthcare Power of [place];Living will Healthcare Power of [place];Living will Healthcare Power of [place];Living will Healthcare Power of [place];Living will  Does patient want to make changes to medical advance directive?  No - Patient declined No - Patient declined  No - Patient declined  Copy of Healthcare Power of Attorney in Chart? No - copy requested Yes - validated most recent copy scanned in chart (See row information) No - copy requested No - copy requested No - copy requested  Would patient like information on creating a medical advance directive?     No - patient declined information    Current Medications (verified) Outpatient Encounter Medications as of 10/24/2021  Medication Sig   ACCU-CHEK AVIVA PLUS test strip Inject 1 strip as directed daily.   acetaminophen (TYLENOL) 500 MG tablet Take 500 mg by mouth every 6 (six) hours as needed for mild pain or headache. As needed.   aspirin 81 MG EC tablet Take 325 mg by mouth daily.   Calcium Carb-Cholecalciferol (CALCIUM 600-D PO) Take 1 tablet by mouth 2 (two) times a week.    EPINEPHrine 0.3 mg/0.3 mL IJ SOAJ injection Inject 0.3 mg into the muscle as needed for anaphylaxis.   ferrous sulfate 325 (65 FE) MG tablet Take 325 mg by mouth daily with breakfast.   furosemide (LASIX) 20 MG tablet Take 1 tablet (20 mg total) by mouth daily.   glimepiride (AMARYL) 4 MG tablet Take 0.5 mg by mouth 2 (two) times daily.   HUMALOG KWIKPEN 100 UNIT/ML KwikPen Inject 5-11 Units into the skin 3 (three) times daily with meals.   Lancets Thin MISC Inject 1 Stick into the skin daily.   levothyroxine (SYNTHROID) 50 MCG tablet Take 50 mcg by mouth daily.   Multiple Vitamins-Minerals (MULTIVITAMIN ADULT) CHEW Chew 2 tablets by mouth daily at 12 noon.   NOVOFINE PEN NEEDLE 32G X 6 MM MISC Inject into the skin.   olmesartan (BENICAR) 20 MG tablet Take 1 tablet (20 mg total) by mouth daily.   OVER THE COUNTER MEDICATION in the morning and at bedtime. Vit D 3(2000)  once two times per day   potassium chloride (KLOR-CON M) 10 MEQ tablet Take 10 mEq by mouth daily.   simvastatin (ZOCOR) 20 MG tablet Take 20 mg by mouth at bedtime.   TRESIBA FLEXTOUCH 200 UNIT/ML FlexTouch Pen Inject 56 Units into the skin at bedtime.   No facility-administered encounter medications on file as of 10/24/2021.    Allergies (verified) Corn oil, Other, Peanut allergen  powder-dnfp, Peanut-containing drug products, and Penicillins   History: Past Medical History:  Diagnosis Date   Allergy    Anemia    Anxiety    Arthritis    Cataract    cervical ca 1990   Depression    Diabetes ([facility])    Hypercholesterolemia    Hypertension    Myocardial infarction ([facility])    Sleep apnea    on CPAP 14 mm PS since February 2017   Stroke [facility])    weakness on R side   Vitamin D deficiency    Past Surgical History:  Procedure Laterality Date   ABDOMINAL HYSTERECTOMY     BARIATRIC SURGERY     BIOPSY  09/17/2020   Procedure: BIOPSY;  Surgeon: Dolores Frame, MD;  [facility];  Service:  Gastroenterology;;   CARDIAC CATHETERIZATION N/A 12/24/2015   Procedure: Right/Left Heart Cath and Coronary Angiography;  Surgeon: Corky Crafts, MD;  Location: [facility] INVASIVE CV LAB;  Service: Cardiovascular;  Laterality: N/A;   COLONOSCOPY  2018   COLONOSCOPY WITH PROPOFOL N/A 09/17/2020   Procedure: COLONOSCOPY WITH PROPOFOL;  Surgeon: Dolores Frame, MD;  [facility];  Service: Gastroenterology;  Laterality: N/A;  9:00   ESOPHAGOGASTRODUODENOSCOPY (EGD) WITH PROPOFOL N/A 09/17/2020   Procedure: ESOPHAGOGASTRODUODENOSCOPY (EGD) WITH PROPOFOL;  Surgeon: Dolores Frame, MD;  [facility];  Service: Gastroenterology;  Laterality: N/A;   HERNIA REPAIR     LEFT HEART CATH AND CORONARY ANGIOGRAPHY N/A 07/12/2017   Procedure: LEFT HEART CATH AND CORONARY ANGIOGRAPHY;  Surgeon: Lyn Records, MD;  Location: [facility] LAB;  Service: Cardiovascular;  Laterality: N/A;   Family History  Problem Relation Age of Onset   Cancer Mother    Colon cancer Mother    Diabetes Mother    Hypertension Father    Thyroid disease Father    Thyroid cancer Father    Diabetes Father    Esophageal cancer Father    Breast cancer Maternal Aunt    Colon cancer Maternal Aunt    Breast cancer Maternal Aunt    Colon cancer Maternal Grandmother    Stroke Maternal Grandfather    Stomach cancer Paternal Grandfather    Colon cancer Son    Colon polyps Neg Hx    Rectal cancer Neg Hx    Social History   Socioeconomic History   Marital status: Married    Spouse name: Not on file   Number of children: 2   Years of education: Not on file   Highest education level: Not on file  Occupational History   Occupation: retired  Tobacco Use   Smoking status: Never   Smokeless tobacco: Never  Vaping Use   Vaping Use: Never used  Substance and Sexual Activity   Alcohol use: Not Currently    Comment: occasionally   Drug use: No   Sexual activity: Never    Partners: Male   Other Topics Concern   Not on file  Social History Narrative   Not on file   Social Determinants of Health   Financial Resource Strain: Not on file  Food Insecurity: Not on file  Transportation Needs: Not on file  Physical Activity: Not on file  Stress: Not on file  Social Connections: Not on file    Tobacco Counseling Counseling given: Not Answered   Clinical Intake:                 Diabetic?yes    Nutrition Risk  Assessment:  Has the patient had any N/V/D within the last 2 months?  No  Does the patient have any non-healing wounds?  No  Has the patient had any unintentional weight loss or weight gain?  Yes   Diabetes:  Is the patient diabetic?  Yes  If diabetic, was a CBG obtained today?  No  Did the patient bring in their glucometer from home?  No  How often do you monitor your CBG's? constantly.   Financial Strains and Diabetes Management:  Are you having any financial strains with the device, your supplies or your medication? No .  Does the patient want to be seen by Chronic Care Management for management of their diabetes?  No  Would the patient like to be referred to a Nutritionist or for Diabetic Management?  No   Diabetic Exams:  Diabetic Eye Exam: Overdue for diabetic eye exam. Pt has been advised about the importance in completing this exam. Patient advised to call and schedule an eye exam. Diabetic Foot Exam: Completed 09/20/21       Activities of Daily Living     No data to display          Patient Care Team: Gilmore Laroche, FNP as PCP - General (Family Medicine) Wyline Mood Dorothe Pea, MD as PCP - Cardiology (Cardiology)  Indicate any recent Medical Services you may have received from other than [facility] providers in the past year (date may be approximate).     Assessment:   This is a routine wellness examination for Jessica Strong.  Hearing/Vision screen No results found.  Dietary issues and exercise activities discussed:     Goals  Addressed   None    Depression Screen    09/20/2021    3:09 PM  PHQ 2/9 Scores  PHQ - 2 Score 6  PHQ- 9 Score 13    Fall Risk    09/20/2021    3:09 PM  Fall Risk   Falls in the past year? 0  Number falls in past yr: 0  Injury with Fall? 0  Risk for fall due to : No Fall Risks  Follow up Falls evaluation completed    FALL RISK PREVENTION PERTAINING TO THE HOME:  Any stairs in or around the home? Yes  If so, are there any without handrails? No  Home free of loose throw rugs in walkways, pet beds, electrical cords, etc? Yes  Adequate lighting in your home to reduce risk of falls? Yes   ASSISTIVE DEVICES UTILIZED TO PREVENT FALLS:  Life alert? Yes  Use of a cane, walker or w/c? Yes  Grab bars in the bathroom? No  Shower chair or bench in shower? Yes  Elevated toilet seat or a handicapped toilet? Yes   TIMED UP AND GO:  Was the test performed? No .  Length of time to ambulate 10 feet:  sec.     Cognitive Function:        Immunizations Immunization History  Administered Date(s) Administered   [employer] Vaccination 07/11/2019, 08/08/2019, 08/11/2020    TDAP status: Due, Education has been provided regarding the importance of this vaccine. Advised may receive this vaccine at local pharmacy or Health Dept. Aware to provide a copy of the vaccination record if obtained from local pharmacy or Health Dept. Verbalized acceptance and understanding.  Flu Vaccine status: Up to date  Pneumococcal vaccine status: Due, Education has been provided regarding the importance of this vaccine. Advised may receive this vaccine at local pharmacy or Health  Dept. Aware to provide a copy of the vaccination record if obtained from local pharmacy or Health Dept. Verbalized acceptance and understanding.  Covid-19 vaccine status: Information provided on how to obtain vaccines.   Qualifies for Shingles Vaccine? Yes   Zostavax completed No   Shingrix Completed?: No.     Education has been provided regarding the importance of this vaccine. Patient has been advised to call insurance company to determine out of pocket expense if they have not yet received this vaccine. Advised may also receive vaccine at local pharmacy or Health Dept. Verbalized acceptance and understanding.  Screening Tests Health Maintenance  Topic Date Due   OPHTHALMOLOGY EXAM  Never done   Hepatitis C Screening  Never done   TETANUS/TDAP  Never done   Zoster Vaccines- Shingrix (1 of 2) Never done   Pneumonia Vaccine 10+ Years old (1 - PCV) Never done   DEXA SCAN  Never done   COVID-19 Vaccine (4 - Moderna series) 10/06/2020   INFLUENZA VACCINE  11/29/2021   HEMOGLOBIN A1C  01/11/2022   FOOT EXAM  09/21/2022   MAMMOGRAM  09/08/2023   COLONOSCOPY (Pts 45-4yrs Insurance coverage will need to be confirmed)  09/17/2025   HPV VACCINES  Aged Out    Health Maintenance  Health Maintenance Due  Topic Date Due   OPHTHALMOLOGY EXAM  Never done   Hepatitis C Screening  Never done   TETANUS/TDAP  Never done   Zoster Vaccines- Shingrix (1 of 2) Never done   Pneumonia Vaccine 91+ Years old (1 - PCV) Never done   DEXA SCAN  Never done   COVID-19 Vaccine (4 - Moderna series) 10/06/2020    Colorectal cancer screening: Type of screening: Colonoscopy. Completed 09/17/2020. Repeat every 5 years  Mammogram status: Completed 09/07/21. Repeat every year  Bone Density status: Ordered 10/24/21. Pt provided with contact info and advised to call to schedule appt.  Lung Cancer Screening: (Low Dose CT Chest recommended if Age 32-80 years, 30 pack-year currently smoking OR have quit w/in 15years.) does not qualify.   Lung Cancer Screening Referral:   Additional Screening:  Hepatitis C Screening: does qualify; Completed   Vision Screening: Recommended annual ophthalmology exams for early detection of glaucoma and other disorders of the eye. Is the patient up to date with their annual eye exam?  Yes   Who is the provider or what is the name of the office in which the patient attends annual eye exams? Dr Ala Dach If pt is not established with a provider, would they like to be referred to a provider to establish care? No .   Dental Screening: Recommended annual dental exams for proper oral hygiene  Community Resource Referral / Chronic Care Management: CRR required this visit?  No   CCM required this visit?  No      Plan:     I have personally reviewed and noted the following in the patient's chart:   Medical and social history Use of alcohol, tobacco or illicit drugs  Current medications and supplements including opioid prescriptions. Patient is not currently taking opioid prescriptions. Functional ability and status Nutritional status Physical activity Advanced directives List of other physicians Hospitalizations, surgeries, and ER visits in previous 12 months Vitals Screenings to include cognitive, depression, and falls Referrals and appointments  In addition, I have reviewed and discussed with patient certain preventive protocols, quality metrics, and best practice recommendations. A written personalized care plan for preventive services as well as general preventive health recommendations were  provided to patient.     Harriet Pho, CMA   10/24/2021   Nurse Notes:

## 2021-10-25 ENCOUNTER — Telehealth: Payer: Self-pay | Admitting: Family Medicine

## 2021-10-25 ENCOUNTER — Telehealth: Payer: Self-pay | Admitting: Nurse Practitioner

## 2021-10-25 ENCOUNTER — Ambulatory Visit (INDEPENDENT_AMBULATORY_CARE_PROVIDER_SITE_OTHER): Payer: Medicare Other

## 2021-10-25 DIAGNOSIS — R0602 Shortness of breath: Secondary | ICD-10-CM

## 2021-10-25 LAB — ECHOCARDIOGRAM COMPLETE
Area-P 1/2: 2.66 cm2
Calc EF: 75.6 %
MV M vel: 4.01 m/s
MV Peak grad: 64.3 mmHg
S' Lateral: 1.94 cm
Single Plane A2C EF: 76.6 %
Single Plane A4C EF: 77.7 %

## 2021-10-25 NOTE — Telephone Encounter (Signed)
Pt called stating she was given 2 different hospitals to go to for an xray. She does not want to go to [facility]. She is wanting to know the name of the other hospital?

## 2021-10-27 ENCOUNTER — Other Ambulatory Visit: Payer: Self-pay

## 2021-10-27 MED ORDER — TRESIBA FLEXTOUCH 200 UNIT/ML ~~LOC~~ SOPN: 55.0000 [IU] | PEN_INJECTOR | Freq: Every evening | SUBCUTANEOUS | 0 refills | Status: DC

## 2021-11-02 ENCOUNTER — Ambulatory Visit: Payer: Medicare Other | Admitting: Nurse Practitioner

## 2021-11-02 ENCOUNTER — Encounter: Payer: Self-pay | Admitting: Nurse Practitioner

## 2021-11-02 VITALS — BP 146/88 | HR 67 | Ht 60.0 in | Wt 274.0 lb

## 2021-11-02 DIAGNOSIS — Z808 Family history of malignant neoplasm of other organs or systems: Secondary | ICD-10-CM

## 2021-11-02 DIAGNOSIS — Z794 Long term (current) use of insulin: Secondary | ICD-10-CM

## 2021-11-02 DIAGNOSIS — N1832 Chronic kidney disease, stage 3b: Secondary | ICD-10-CM | POA: Diagnosis not present

## 2021-11-02 DIAGNOSIS — E1122 Type 2 diabetes mellitus with diabetic chronic kidney disease: Secondary | ICD-10-CM

## 2021-11-02 DIAGNOSIS — E039 Hypothyroidism, unspecified: Secondary | ICD-10-CM | POA: Diagnosis not present

## 2021-11-02 LAB — POCT GLYCOSYLATED HEMOGLOBIN (HGB A1C): HbA1c POC (<> result, manual entry): 10.5 % (ref 4.0–5.6)

## 2021-11-02 MED ORDER — TIRZEPATIDE 5 MG/0.5ML ~~LOC~~ SOAJ: 5.0000 mg | SUBCUTANEOUS | 3 refills | Status: DC

## 2021-11-02 NOTE — Progress Notes (Signed)
Endocrinology Follow Up Note       11/02/2021, 4:12 PM   Subjective:    Patient ID: Jessica Strong, female    DOB: [birth year].  Jessica Strong is being seen in follow up after being seen in consultation for management of currently uncontrolled symptomatic diabetes requested by  Alvira Monday, [place].   Past Medical History:  Diagnosis Date   Allergy    Anemia    Anxiety    Arthritis    Cataract    cervical ca 1990   Depression    Diabetes ([place])    Hypercholesterolemia    Hypertension    Myocardial infarction ([place])    Sleep apnea    on CPAP 14 mm PS since February 2017   Stroke [facility])    weakness on R side   Vitamin D deficiency     Past Surgical History:  Procedure Laterality Date   ABDOMINAL HYSTERECTOMY     BARIATRIC SURGERY     BIOPSY  09/17/2020   Procedure: BIOPSY;  Surgeon: Harvel Quale, MD;  [facility];  Service: Gastroenterology;;   CARDIAC CATHETERIZATION N/A 12/24/2015   Procedure: Right/Left Heart Cath and Coronary Angiography;  Surgeon: Jettie Booze, MD;  Location: [place] [facility];  Service: Cardiovascular;  Laterality: N/A;   COLONOSCOPY  2018   COLONOSCOPY WITH PROPOFOL N/A 09/17/2020   Procedure: COLONOSCOPY WITH PROPOFOL;  Surgeon: Harvel Quale, MD;  Location: [facility];  Service: Gastroenterology;  Laterality: N/A;  9:00   ESOPHAGOGASTRODUODENOSCOPY (EGD) WITH PROPOFOL N/A 09/17/2020   Procedure: ESOPHAGOGASTRODUODENOSCOPY (EGD) WITH PROPOFOL;  Surgeon: Harvel Quale, MD;  Location[facility];  Service: Gastroenterology;  Laterality: N/A;   HERNIA REPAIR     LEFT HEART CATH AND CORONARY ANGIOGRAPHY N/A 07/12/2017   Procedure: LEFT HEART CATH AND CORONARY ANGIOGRAPHY;  Surgeon: Belva Crome, MD;  Location: [place] [facility];  Service: Cardiovascular;  Laterality: N/A;    Social History   Socioeconomic History    Marital status: Married    Spouse name: Not on file   Number of children: 2   Years of education: Not on file   Highest education level: Not on file  Occupational History   Occupation: retired  Tobacco Use   Smoking status: Never   Smokeless tobacco: Never  Vaping Use   Vaping Use: Never used  Substance and Sexual Activity   Alcohol use: Not Currently    Comment: occasionally   Drug use: No   Sexual activity: Never    Partners: Male  Other Topics Concern   Not on file  Social History Narrative   Not on file   Social Determinants of Health   Financial Resource Strain: Not on file  Food Insecurity: Not on file  Transportation Needs: Not on file  Physical Activity: Not on file  Stress: Not on file  Social Connections: Not on file    Family History  Problem Relation Age of Onset   Cancer Mother    Colon cancer Mother    Diabetes Mother    Hypertension Father    Thyroid disease Father    Thyroid cancer Father  Diabetes Father    Esophageal cancer Father    Breast cancer Maternal Aunt    Colon cancer Maternal Aunt    Breast cancer Maternal Aunt    Colon cancer Maternal Grandmother    Stroke Maternal Grandfather    Stomach cancer Paternal Grandfather    Colon cancer Son    Colon polyps Neg Hx    Rectal cancer Neg Hx     Outpatient Encounter Medications as of 11/02/2021  Medication Sig   tirzepatide (MOUNJARO) 5 MG/0.5ML Pen Inject 5 mg into the skin once a week.   ACCU-CHEK AVIVA PLUS test strip Inject 1 strip as directed daily.   acetaminophen (TYLENOL) 500 MG tablet Take 500 mg by mouth every 6 (six) hours as needed for mild pain or headache. As needed.   aspirin 81 MG EC tablet Take 325 mg by mouth daily.   Calcium Carb-Cholecalciferol (CALCIUM 600-D PO) Take 1 tablet by mouth 2 (two) times a week.   EPINEPHrine 0.3 mg/0.3 mL IJ SOAJ injection Inject 0.3 mg into the muscle as needed for anaphylaxis.   ferrous sulfate 325 (65 FE) MG tablet Take 325 mg by mouth  daily with breakfast.   furosemide (LASIX) 20 MG tablet Take 1 tablet (20 mg total) by mouth daily.   HUMALOG KWIKPEN 100 UNIT/ML KwikPen Inject 5-11 Units into the skin 3 (three) times daily with meals.   Lancets Thin MISC Inject 1 Stick into the skin daily.   levothyroxine (SYNTHROID) 50 MCG tablet Take 50 mcg by mouth daily.   Multiple Vitamins-Minerals (MULTIVITAMIN ADULT) CHEW Chew 2 tablets by mouth daily at 12 noon.   NOVOFINE PEN NEEDLE 32G X 6 MM MISC Inject into the skin.   olmesartan (BENICAR) 20 MG tablet Take 1 tablet (20 mg total) by mouth daily.   OVER THE COUNTER MEDICATION in the morning and at bedtime. Vit D 3(2000)  once two times per day   potassium chloride (KLOR-CON M) 10 MEQ tablet Take 10 mEq by mouth daily.   simvastatin (ZOCOR) 20 MG tablet Take 20 mg by mouth at bedtime.   TRESIBA FLEXTOUCH 200 UNIT/ML FlexTouch Pen Inject 56 Units into the skin at bedtime.   [DISCONTINUED] glimepiride (AMARYL) 4 MG tablet Take 2 mg by mouth 2 (two) times daily.   No facility-administered encounter medications on file as of 11/02/2021.    ALLERGIES: Allergies  Allergen Reactions   Corn Oil Rash   Other Rash    PT STATES SHE IS ALLERGIC TO MOST FOODS-STATES IT WAS FOUND IN ALLERGY TESTING   Peanut Allergen Powder-Dnfp Rash   Peanut-Containing Drug Products Rash   Penicillins Rash    Has patient had a PCN reaction causing immediate rash, facial/tongue/throat swelling, SOB or lightheadedness with hypotension: Yes Has patient had a PCN reaction causing severe rash involving mucus membranes or skin necrosis: No Has patient had a PCN reaction that required hospitalization No Has patient had a PCN reaction occurring within the last 10 years: No. Has not had in 40-50 years If all of the above answers are "NO", then may proceed with Cephalosporin use.    VACCINATION STATUS: Immunization History  Administered Date(s) Administered   Moderna Sars-Covid-2 Vaccination 07/11/2019,  08/08/2019, 08/11/2020    Diabetes She presents for her follow-up diabetic visit. She has type 2 diabetes mellitus. Onset time: Diagnosed at approx age of 76. Her disease course has been fluctuating. There are no hypoglycemic associated symptoms. Pertinent negatives for hypoglycemia include no sweats or tremors. Associated symptoms  include fatigue, foot paresthesias, polydipsia and polyuria. There are no hypoglycemic complications. Symptoms are stable. Diabetic complications include a CVA, heart disease, nephropathy and peripheral neuropathy. Risk factors for coronary artery disease include diabetes mellitus, dyslipidemia, family history, obesity, hypertension, post-menopausal and sedentary lifestyle. Current diabetic treatment includes intensive insulin program and oral agent (monotherapy). She is compliant with treatment most of the time. Her weight is increasing steadily (gained 60+ lbs in last 6 months). She is following a generally unhealthy diet. When asked about meal planning, she reported none. She has not had a previous visit with a dietitian. She rarely participates in exercise. Her home blood glucose trend is fluctuating minimally. Her breakfast blood glucose range is generally >200 mg/dl. Her lunch blood glucose range is generally >200 mg/dl. Her dinner blood glucose range is generally >200 mg/dl. Her bedtime blood glucose range is generally >200 mg/dl. Her overall blood glucose range is >200 mg/dl. (She presents today, accompanied by her husband, with her CGM and logs showing persistent hyperglycemia overall.  Her POCT A1c today is 10.5%,  increasing from last visit of 9.1%.  Her husband reports she has been drinking a Mt dew each morning and she admits to eating popsicles lately.  She is still concerned with her weight gain overall.  Analysis of her CGM shows TIR 7%, TAR 93%, TBR 0% with a GMI of 9.7%.) An ACE inhibitor/angiotensin II receptor blocker is being taken. She does not see a  podiatrist.Eye exam is current.  Hypertension This is a chronic problem. The current episode started more than 1 year ago. The problem has been resolved since onset. The problem is controlled. Pertinent negatives include no sweats. Agents associated with hypertension include thyroid hormones. Risk factors for coronary artery disease include sedentary lifestyle, obesity, post-menopausal state, dyslipidemia, diabetes mellitus and family history. Past treatments include angiotensin blockers and diuretics. The current treatment provides moderate improvement. Compliance problems include diet and exercise.  Hypertensive end-organ damage includes kidney disease, CAD/MI and CVA. Identifiable causes of hypertension include a thyroid problem.     Review of systems  Constitutional: + steadily increasing body weight, current Body mass index is 53.51 kg/m., + fatigue, no subjective hyperthermia, no subjective hypothermia Eyes: no blurry vision, no xerophthalmia ENT: no sore throat, no nodules palpated in throat, no dysphagia/odynophagia, no hoarseness Cardiovascular: no chest pain, no shortness of breath, no palpitations, + leg swelling Respiratory: no cough, + shortness of breath with exertion Gastrointestinal: no nausea/vomiting/diarrhea Musculoskeletal: no muscle/joint aches, right side weakness from previous CVA, complains of right leg pain Skin: no rashes, no hyperemia Neurological: no tremors, + numbness to left arm and left leg, no dizziness Psychiatric: no depression, no anxiety  Objective:     BP (!) 146/88   Pulse 67   Ht 5' (1.524 m)   Wt 274 lb (124.3 kg)   BMI 53.51 kg/m   Wt Readings from Last 3 Encounters:  11/02/21 274 lb (124.3 kg)  10/10/21 273 lb (123.8 kg)  10/03/21 278 lb (126.1 kg)     BP Readings from Last 3 Encounters:  11/02/21 (!) 146/88  10/10/21 130/70  10/03/21 (!) 156/70     Physical Exam- Limited  Constitutional:  Body mass index is 53.51 kg/m. , not in  acute distress, normal state of mind Eyes:  EOMI, no exophthalmos Neck: Supple Cardiovascular: RRR, no murmurs, rubs, or gallops, 1+ pitting edema to BLE Respiratory: Adequate breathing efforts, no crackles, rales, rhonchi, or wheezing Musculoskeletal: minimal right sided weakness- residual from previous  stroke Skin:  no rashes, no hyperemia Neurological: no tremor with outstretched hands    CMP ( most recent) CMP     Component Value Date/Time   NA 139 09/15/2020 1037   K 4.5 09/15/2020 1037   CL 107 09/15/2020 1037   CO2 22 09/15/2020 1037   GLUCOSE 172 (H) 09/15/2020 1037   BUN 22 (A) 05/31/2021 0000   CREATININE 1.80 (H) 07/21/2021 1237   CALCIUM 9.2 09/15/2020 1037   GFRNONAA 32 (L) 09/15/2020 1037   GFRAA 48 (L) 07/09/2017 1112     Diabetic Labs (most recent): Lab Results  Component Value Date   HGBA1C 10.5 11/02/2021   HGBA1C 9.1 07/11/2021   MICROALBUR 150 07/11/2021     Lipid Panel ( most recent) Lipid Panel  No results found for: "CHOL", "TRIG", "HDL", "CHOLHDL", "VLDL", "LDLCALC", "LDLDIRECT", "LABVLDL"    Lab Results  Component Value Date   TSH 1.533 09/29/2020           Assessment & Plan:   1) Type 2 diabetes mellitus with stage 3b chronic kidney disease, with long-term current use of insulin ([place])  She presents today, accompanied by her husband, with her CGM and logs showing persistent hyperglycemia overall.  Her POCT A1c today is 10.5%,  increasing from last visit of 9.1%.  Her husband reports she has been drinking a Mt dew each morning and she admits to eating popsicles lately.  She is still concerned with her weight gain overall.  Analysis of her CGM shows TIR 7%, TAR 93%, TBR 0% with a GMI of 9.7%.  - Jessica Aguallo has currently uncontrolled symptomatic type 2 DM since 76 years of age.   -Recent labs reviewed.  - I had a long discussion with her about the progressive nature of diabetes and the pathology behind its complications. -her  diabetes is complicated by CVA, MI, CAD, OSA, CKD and she remains at a high risk for more acute and chronic complications which include CAD, CVA, CKD, retinopathy, and neuropathy. These are all discussed in detail with her.  The following Lifestyle Medicine recommendations according to [place] [facility]) were discussed and offered to patient and she agrees to start the journey:  A. Whole Foods, Plant-based plate comprising of fruits and vegetables, plant-based proteins, whole-grain carbohydrates was discussed in detail with the patient.   A list for source of those nutrients were also provided to the patient.  Patient will use only water or unsweetened tea for hydration. B.  The need to stay away from risky substances including alcohol, smoking; obtaining 7 to 9 hours of restorative sleep, at least 150 minutes of moderate intensity exercise weekly, the importance of healthy social connections,  and stress reduction techniques were discussed. C.  A full color page of  Calorie density of various food groups per pound showing examples of each food groups was provided to the patient.  - Nutritional counseling repeated at each appointment due to patients tendency to fall back in to old habits.  - The patient admits there is a room for improvement in their diet and drink choices. -  Suggestion is made for the patient to avoid simple carbohydrates from their diet including Cakes, Sweet Desserts / Pastries, Ice Cream, Soda (diet and regular), Sweet Tea, Candies, Chips, Cookies, Sweet Pastries, Store Bought Juices, Alcohol in Excess of 1-2 drinks a day, Artificial Sweeteners, Coffee Creamer, and "Sugar-free" Products. This will help patient to have stable blood glucose profile and potentially avoid  unintended weight gain.   - I encouraged the patient to switch to unprocessed or minimally processed complex starch and increased protein intake (animal or plant source), fruits, and  vegetables.   - Patient is advised to stick to a routine mealtimes to eat 3 meals a day and avoid unnecessary snacks (to snack only to correct hypoglycemia).  - she will be scheduled with Jearld Fenton, RDN, CDE for diabetes education.  - I have approached her with the following individualized plan to manage her diabetes and patient agrees:   -She is advised to continue her Tresiba 55 units SQ nightly, Humalog 10-16 units TID with meals if glucose is above 90 and she is eating (Specific instructions on how to titrate insulin dosage based on glucose readings given to patient in writing).  Will stop her Glimepiride and start her on Mounjaro 2.5 mg SQ weekly x 4 weeks (sample provided) then will increase to 5 mg SQ weekly thereafter if tolerated well.  She notes her father had thyroid cancer but is unsure of which type.  Will plan on doing thyroid ultrasound prior to her starting the medication for baseline anatomy scan.  -she is encouraged to continue monitoring glucose 4 times daily (using her CGM), before meals and before bed, and to call the clinic if she has readings less than 70 or above 300 for 3 tests in a row.    - she is warned not to take insulin without proper monitoring per orders. - Adjustment parameters are given to her for hypo and hyperglycemia in writing.  - her Metformin was discontinued, risk outweighs benefit for this patient given recent renal decline.  - Specific targets for  A1c; LDL, HDL, and Triglycerides were discussed with the patient.  2) Blood Pressure /Hypertension:  her blood pressure is controlled to target for her age.   she is advised to continue her current medications including Benicar HCT 20-12.5 mg p.o. daily with breakfast.  3) Lipids/Hyperlipidemia:    There is no recent lipid panel to review nor does she appear to be on any lipid lowering agents.   4)  Weight/Diet:  her Body mass index is 53.51 kg/m.  -  clearly complicating her diabetes care.   she  is a candidate for weight loss. I discussed with her the fact that loss of 5 - 10% of her  current body weight will have the most impact on her diabetes management.  Exercise, and detailed carbohydrates information provided  -  detailed on discharge instructions.  5) Chronic Care/Health Maintenance: -she is on ACEI/ARB and not on Statin medications and is encouraged to initiate and continue to follow up with Ophthalmology, Dentist, Podiatrist at least yearly or according to recommendations, and advised to stay away from smoking. I have recommended yearly flu vaccine and pneumonia vaccine at least every 5 years; moderate intensity exercise for up to 150 minutes weekly; and sleep for at least 7 hours a day.  - she is advised to maintain close follow up with Alvira Monday, FNP for primary care needs, as well as her other providers for optimal and coordinated care.       I spent 42 minutes in the care of the patient today including review of labs from [place], Lipids, Thyroid Function, Hematology (current and previous including abstractions from other facilities); face-to-face time discussing  her blood glucose readings/logs, discussing hypoglycemia and hyperglycemia episodes and symptoms, medications doses, her options of short and long term treatment based on the latest standards of  care / guidelines;  discussion about incorporating lifestyle medicine;  and documenting the encounter. Risk reduction counseling performed per USPSTF guidelines to reduce obesity and cardiovascular risk factors.     Please refer to Patient Instructions for Blood Glucose Monitoring and Insulin/Medications Dosing Guide"  in media tab for additional information. Please  also refer to " Patient Self Inventory" in the Media  tab for reviewed elements of pertinent patient history.  [place] participated in the discussions, expressed understanding, and voiced agreement with the above plans.  All questions were answered to her  satisfaction. she is encouraged to contact clinic should she have any questions or concerns prior to her return visit.     Follow up plan: - Return in about 3 months (around 02/02/2022) for No previsit labs, Bring meter and logs.    Rayetta Pigg, [facility] [address] [place], [place] [address] Phone: [phone] Fax: [phone]  11/02/2021, 4:12 PM

## 2021-11-04 ENCOUNTER — Ambulatory Visit (HOSPITAL_COMMUNITY)
Admission: RE | Admit: 2021-11-04 | Discharge: 2021-11-04 | Disposition: A | Discharge status: Home / Self Care | Payer: Medicare Other | Source: Ambulatory Visit | Attending: Family Medicine | Admitting: Family Medicine

## 2021-11-04 DIAGNOSIS — N951 Menopausal and female climacteric states: Secondary | ICD-10-CM

## 2021-11-04 DIAGNOSIS — Z794 Long term (current) use of insulin: Secondary | ICD-10-CM | POA: Insufficient documentation

## 2021-11-04 DIAGNOSIS — M85851 Other specified disorders of bone density and structure, right thigh: Secondary | ICD-10-CM | POA: Diagnosis not present

## 2021-11-04 DIAGNOSIS — Z13820 Encounter for screening for osteoporosis: Secondary | ICD-10-CM | POA: Diagnosis present

## 2021-11-04 DIAGNOSIS — Z780 Asymptomatic menopausal state: Secondary | ICD-10-CM | POA: Insufficient documentation

## 2021-11-04 DIAGNOSIS — E119 Type 2 diabetes mellitus without complications: Secondary | ICD-10-CM | POA: Insufficient documentation

## 2021-11-05 LAB — CMP14+EGFR
ALT: 17 IU/L (ref 0–32)
AST: 19 IU/L (ref 0–40)
Albumin/Globulin Ratio: 1.6 (ref 1.2–2.2)
Albumin: 4.2 g/dL (ref 3.7–4.7)
Alkaline Phosphatase: 147 IU/L — ABNORMAL HIGH (ref 44–121)
BUN/Creatinine Ratio: 18 (ref 12–28)
BUN: 30 mg/dL — ABNORMAL HIGH (ref 8–27)
Bilirubin Total: 0.2 mg/dL (ref 0.0–1.2)
CO2: 19 mmol/L — ABNORMAL LOW (ref 20–29)
Calcium: 9.5 mg/dL (ref 8.7–10.3)
Chloride: 105 mmol/L (ref 96–106)
Creatinine, Ser: 1.66 mg/dL — ABNORMAL HIGH (ref 0.57–1.00)
Globulin, Total: 2.6 g/dL (ref 1.5–4.5)
Glucose: 203 mg/dL — ABNORMAL HIGH (ref 70–99)
Potassium: 5.1 mmol/L (ref 3.5–5.2)
Sodium: 140 mmol/L (ref 134–144)
Total Protein: 6.8 g/dL (ref 6.0–8.5)
eGFR: 32 mL/min/{1.73_m2} — ABNORMAL LOW (ref >59)

## 2021-11-05 LAB — CBC WITH DIFFERENTIAL/PLATELET
Basophils Absolute: 0 10*3/uL (ref 0.0–0.2)
Basos: 1 %
EOS (ABSOLUTE): 0.2 10*3/uL (ref 0.0–0.4)
Eos: 3 %
Hematocrit: 35.3 % (ref 34.0–46.6)
Hemoglobin: 11 g/dL — ABNORMAL LOW (ref 11.1–15.9)
Immature Grans (Abs): 0 10*3/uL (ref 0.0–0.1)
Immature Granulocytes: 1 %
Lymphocytes Absolute: 1.6 10*3/uL (ref 0.7–3.1)
Lymphs: 23 %
MCH: 25.5 pg — ABNORMAL LOW (ref 26.6–33.0)
MCHC: 31.2 g/dL — ABNORMAL LOW (ref 31.5–35.7)
MCV: 82 fL (ref 79–97)
Monocytes Absolute: 0.6 10*3/uL (ref 0.1–0.9)
Monocytes: 8 %
Neutrophils Absolute: 4.6 10*3/uL (ref 1.4–7.0)
Neutrophils: 64 %
Platelets: 304 10*3/uL (ref 150–450)
RBC: 4.32 x10E6/uL (ref 3.77–5.28)
RDW: 13.6 % (ref 11.7–15.4)
WBC: 7.1 10*3/uL (ref 3.4–10.8)

## 2021-11-05 LAB — LIPID PANEL
Chol/HDL Ratio: 2.9 ratio (ref 0.0–4.4)
Cholesterol, Total: 126 mg/dL (ref 100–199)
HDL: 43 mg/dL (ref >39)
LDL Chol Calc (NIH): 59 mg/dL (ref 0–99)
Triglycerides: 134 mg/dL (ref 0–149)
VLDL Cholesterol Cal: 24 mg/dL (ref 5–40)

## 2021-11-05 LAB — VITAMIN D 25 HYDROXY (VIT D DEFICIENCY, FRACTURES): Vit D, 25-Hydroxy: 54 ng/mL (ref 30.0–100.0)

## 2021-11-05 LAB — TSH+FREE T4
Free T4: 1.06 ng/dL (ref 0.82–1.77)
TSH: 5.75 u[IU]/mL — ABNORMAL HIGH (ref 0.450–4.500)

## 2021-11-07 ENCOUNTER — Other Ambulatory Visit: Payer: Self-pay | Admitting: Family Medicine

## 2021-11-07 DIAGNOSIS — E038 Other specified hypothyroidism: Secondary | ICD-10-CM

## 2021-11-07 DIAGNOSIS — I10 Essential (primary) hypertension: Secondary | ICD-10-CM

## 2021-11-07 NOTE — Progress Notes (Signed)
Please, inform the patient that the Dexa Scan shows that she is osteopenic. I recommend OTC vit D 600iu daily and Calcium '1200mg'$  daily for bone health.

## 2021-11-07 NOTE — Progress Notes (Signed)
Please inform the patient to get labs 2-3 days before her appt on 11/22/21. I want to reassess her kidneys function and her thyroid levels.

## 2021-11-08 ENCOUNTER — Telehealth: Payer: Self-pay | Admitting: *Deleted

## 2021-11-08 NOTE — Telephone Encounter (Signed)
-----   Message from [name], MD sent at 11/06/2021 11:21 AM EDT ----- Echo shows normal heart function  Zandra Abts MD

## 2021-11-08 NOTE — Telephone Encounter (Signed)
Laurine Blazer, LPN  0/51/1021  1:17 PM EDT Back to Top    Notified, copy to pcp.

## 2021-11-14 ENCOUNTER — Ambulatory Visit (INDEPENDENT_AMBULATORY_CARE_PROVIDER_SITE_OTHER): Payer: Medicare Other | Admitting: Urology

## 2021-11-14 ENCOUNTER — Encounter: Payer: Self-pay | Admitting: Urology

## 2021-11-14 VITALS — BP 131/70 | HR 78 | Ht 60.0 in | Wt 274.0 lb

## 2021-11-14 DIAGNOSIS — N3281 Overactive bladder: Secondary | ICD-10-CM | POA: Diagnosis not present

## 2021-11-14 LAB — URINALYSIS, ROUTINE W REFLEX MICROSCOPIC
Bilirubin, UA: NEGATIVE
Glucose, UA: NEGATIVE
Ketones, UA: NEGATIVE
Leukocytes,UA: NEGATIVE
Nitrite, UA: NEGATIVE
Protein,UA: NEGATIVE
RBC, UA: NEGATIVE
Specific Gravity, UA: 1.02 (ref 1.005–1.030)
Urobilinogen, Ur: 0.2 mg/dL (ref 0.2–1.0)
pH, UA: 6 (ref 5.0–7.5)

## 2021-11-14 MED ORDER — CIPROFLOXACIN HCL 500 MG PO TABS
500.0000 mg | ORAL_TABLET | Freq: Once | ORAL | Status: AC
  Administered 2021-11-14: 500 mg via ORAL

## 2021-11-14 MED ORDER — TOLTERODINE TARTRATE ER 4 MG PO CP24
4.0000 mg | ORAL_CAPSULE | Freq: Every day | ORAL | 11 refills | Status: DC
Start: 2021-11-14 — End: 2022-03-06

## 2021-11-14 NOTE — Progress Notes (Signed)
   11/14/21  CC: No chief complaint on file.   HPI:  F/u -   1) urinary frequency-she has frequency and urgency. She has incontinence. She wears pads and goes through several per day and night. She s/p caldera sling on 01/05/2018 with Dr. Carlota Raspberry. No change in incontinence. Maybe worse. She leaks with urgency. Not so much with stress. No gross hematuria. She tried Myrbetriq. 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 She does have constipation.  CT abdomen and pelvis March 2023 benign GU tract.  Punctate left renal stone.   UA clear, PVR 5 ml.    Today, seen for the above. Trial of Gemtesa (given prior constipation) done June 2023. We discussed UDS in [place] and botox. Logan Bores worked well for a week but then stopped. Standing triggers. She c/o urgency and leakage.     There were no vitals taken for this visit. NED. A&Ox3.   No respiratory distress   Abd soft, NT, ND Normal external genitalia with patent urethral meatus Good support without prolapse. No sling palpable  No SUI with cough   Chaperone: Kourtney - for exam and cysto  Cystoscopy Procedure Note  Patient identification was confirmed, informed consent was obtained, and patient was prepped using Betadine solution.  Lidocaine jelly was administered per urethral meatus.    Procedure: - Flexible cystoscope introduced, without any difficulty.   - Thorough search of the bladder revealed:    normal urethral meatus    normal urothelium    no stones    no ulcers     no tumors    no urethral polyps    no trabeculation  - Ureteral orifices were normal in position and appearance.  Post-Procedure: - Patient tolerated the procedure well  Assessment/ Plan:  Urgency, leakage - check UDS at [place] and f/u to review. Consider oab meds, PT, PTNS or botox. Trial of tolterodine (she wanted to try something else).    No follow-ups on file.  Festus Aloe, MD

## 2021-11-17 ENCOUNTER — Ambulatory Visit (HOSPITAL_COMMUNITY)
Admission: RE | Admit: 2021-11-17 | Discharge: 2021-11-17 | Disposition: A | Discharge status: Home / Self Care | Payer: Medicare Other | Source: Ambulatory Visit | Attending: Nurse Practitioner | Admitting: Nurse Practitioner

## 2021-11-17 DIAGNOSIS — E039 Hypothyroidism, unspecified: Secondary | ICD-10-CM | POA: Insufficient documentation

## 2021-11-17 DIAGNOSIS — Z808 Family history of malignant neoplasm of other organs or systems: Secondary | ICD-10-CM | POA: Insufficient documentation

## 2021-11-22 ENCOUNTER — Ambulatory Visit (INDEPENDENT_AMBULATORY_CARE_PROVIDER_SITE_OTHER): Payer: Medicare Other | Admitting: Family Medicine

## 2021-11-22 ENCOUNTER — Encounter: Payer: Self-pay | Admitting: Family Medicine

## 2021-11-22 VITALS — BP 169/70 | HR 73 | Ht 60.0 in | Wt 273.2 lb

## 2021-11-22 DIAGNOSIS — R7989 Other specified abnormal findings of blood chemistry: Secondary | ICD-10-CM

## 2021-11-22 DIAGNOSIS — E1101 Type 2 diabetes mellitus with hyperosmolarity with coma: Secondary | ICD-10-CM

## 2021-11-22 DIAGNOSIS — B351 Tinea unguium: Secondary | ICD-10-CM | POA: Diagnosis not present

## 2021-11-22 DIAGNOSIS — E038 Other specified hypothyroidism: Secondary | ICD-10-CM | POA: Diagnosis not present

## 2021-11-22 DIAGNOSIS — R635 Abnormal weight gain: Secondary | ICD-10-CM

## 2021-11-22 DIAGNOSIS — Z794 Long term (current) use of insulin: Secondary | ICD-10-CM

## 2021-11-22 DIAGNOSIS — I10 Essential (primary) hypertension: Secondary | ICD-10-CM

## 2021-11-22 MED ORDER — LEVOTHYROXINE SODIUM 50 MCG PO TABS: 50.0000 ug | ORAL_TABLET | Freq: Every day | ORAL | 2 refills | Status: DC

## 2021-11-22 MED ORDER — LEVOTHYROXINE SODIUM 75 MCG PO TABS: 75.0000 ug | ORAL_TABLET | Freq: Every day | ORAL | 3 refills | Status: DC

## 2021-11-22 MED ORDER — TERBINAFINE HCL 1 % EX CREA: 1.0000 | TOPICAL_CREAM | Freq: Two times a day (BID) | CUTANEOUS | 0 refills | Status: DC

## 2021-11-22 NOTE — Progress Notes (Addendum)
Established Patient Office Visit  Subjective:  Patient ID: Jessica Strong, female    DOB: [birth year]  Age: 76 y.o. MRN: [id number]  CC:  Chief Complaint  Patient presents with   Follow-up    HPI Jessica Strong is a 76 y.o. female with past medical history of hypothyroidism, T2DM presents for f/u of  chronic medical conditions. T2MD: HX of uncontrolled symptomatic type 2 DM since 76 years of age. Reports checking BG 4X daily but forgot to bring her CGM and logs today. She was started on Marjuoro on 11/02/21. Current HgA1C is 10.5.  Wt gain: she has lost 1 lb since starting Marjuoro. She notes having swelling in her lower legs. She was recently placed on Lasix by her cardiologist.  HTN: elevated BP today of 169/70. Reports eating hamburgers with added salt and a tomato sandwich with added salt on 11/21/21--report compliance with antihypertensive.  Hypothyroidism: elevated TSH with normal T4  on 11/04/21. Notes to be taking her Synthroid with food along with her other medications.  Onychomycosis:reports using terbinafine hydrochloride cream 1% for 6 months to a year with minimum relief. Fungus of the toenails is only on the right great toe.     Past Medical History:  Diagnosis Date   Allergy    Anemia    Anxiety    Arthritis    Cataract    cervical ca 1990   Depression    Diabetes ([place])    Hypercholesterolemia    Hypertension    Myocardial infarction ([place])    Sleep apnea    on CPAP 14 mm PS since February 2017   Stroke [facility])    weakness on R side   Vitamin D deficiency     Past Surgical History:  Procedure Laterality Date   ABDOMINAL HYSTERECTOMY     BARIATRIC SURGERY     BIOPSY  09/17/2020   Procedure: BIOPSY;  Surgeon: Harvel Quale, MD;  [facility]: [facility];  Service: Gastroenterology;;   CARDIAC CATHETERIZATION N/A 12/24/2015   Procedure: Right/Left Heart Cath and Coronary Angiography;  Surgeon: Jettie Booze, MD;  Location: [place] [facility];   Service: Cardiovascular;  Laterality: N/A;   COLONOSCOPY  2018   COLONOSCOPY WITH PROPOFOL N/A 09/17/2020   Procedure: COLONOSCOPY WITH PROPOFOL;  Surgeon: Harvel Quale, MD;  Location: [facility];  Service: Gastroenterology;  Laterality: N/A;  9:00   ESOPHAGOGASTRODUODENOSCOPY (EGD) WITH PROPOFOL N/A 09/17/2020   Procedure: ESOPHAGOGASTRODUODENOSCOPY (EGD) WITH PROPOFOL;  Surgeon: Harvel Quale, MD;  Location: [facility];  Service: Gastroenterology;  Laterality: N/A;   HERNIA REPAIR     LEFT HEART CATH AND CORONARY ANGIOGRAPHY N/A 07/12/2017   Procedure: LEFT HEART CATH AND CORONARY ANGIOGRAPHY;  Surgeon: Belva Crome, MD;  Location: [place] [facility];  Service: Cardiovascular;  Laterality: N/A;    Family History  Problem Relation Age of Onset   Cancer Mother    Colon cancer Mother    Diabetes Mother    Hypertension Father    Thyroid disease Father    Thyroid cancer Father    Diabetes Father    Esophageal cancer Father    Breast cancer Maternal Aunt    Colon cancer Maternal Aunt    Breast cancer Maternal Aunt    Colon cancer Maternal Grandmother    Stroke Maternal Grandfather    Stomach cancer Paternal Grandfather    Colon cancer Son    Colon polyps Neg Hx    Rectal cancer Neg  Hx     Social History   Socioeconomic History   Marital status: Married    Spouse name: Not on file   Number of children: 2   Years of education: Not on file   Highest education level: Not on file  Occupational History   Occupation: retired  Tobacco Use   Smoking status: Never   Smokeless tobacco: Never  Vaping Use   Vaping Use: Never used  Substance and Sexual Activity   Alcohol use: Not Currently    Comment: occasionally   Drug use: No   Sexual activity: Never    Partners: Male  Other Topics Concern   Not on file  Social History Narrative   Not on file   Social Determinants of Health   Financial Resource Strain: Not on file  Food Insecurity: Not on  file  Transportation Needs: Not on file  Physical Activity: Not on file  Stress: Not on file  Social Connections: Not on file  Intimate Partner Violence: Not on file    Outpatient Medications Prior to Visit  Medication Sig Dispense Refill   ACCU-CHEK AVIVA PLUS test strip Inject 1 strip as directed daily.  4   acetaminophen (TYLENOL) 500 MG tablet Take 500 mg by mouth every 6 (six) hours as needed for mild pain or headache. As needed.     aspirin 81 MG EC tablet Take 325 mg by mouth daily.     Calcium Carb-Cholecalciferol (CALCIUM 600-D PO) Take 1 tablet by mouth 2 (two) times a week.     EPINEPHrine 0.3 mg/0.3 mL IJ SOAJ injection Inject 0.3 mg into the muscle as needed for anaphylaxis.     ferrous sulfate 325 (65 FE) MG tablet Take 325 mg by mouth daily with breakfast.     furosemide (LASIX) 20 MG tablet Take 1 tablet (20 mg total) by mouth daily. 30 tablet 6   HUMALOG KWIKPEN 100 UNIT/ML KwikPen Inject 5-11 Units into the skin 3 (three) times daily with meals.     Lancets Thin MISC Inject 1 Stick into the skin daily.     Multiple Vitamins-Minerals (MULTIVITAMIN ADULT) CHEW Chew 2 tablets by mouth daily at 12 noon.     NOVOFINE PEN NEEDLE 32G X 6 MM MISC Inject into the skin.     olmesartan (BENICAR) 20 MG tablet Take 1 tablet (20 mg total) by mouth daily. 30 tablet 6   OVER THE COUNTER MEDICATION in the morning and at bedtime. Vit D 3(2000)  once two times per day     potassium chloride (KLOR-CON M) 10 MEQ tablet Take 10 mEq by mouth daily.     simvastatin (ZOCOR) 20 MG tablet Take 20 mg by mouth at bedtime.     tirzepatide [facility]) 5 MG/0.5ML Pen Inject 5 mg into the skin once a week. 3 mL 3   tolterodine (DETROL LA) 4 MG 24 hr capsule Take 1 capsule (4 mg total) by mouth daily. 30 capsule 11   TRESIBA FLEXTOUCH 200 UNIT/ML FlexTouch Pen Inject 56 Units into the skin at bedtime. 45 mL 0   levothyroxine (SYNTHROID) 50 MCG tablet Take 50 mcg by mouth daily.     No  facility-administered medications prior to visit.    Allergies  Allergen Reactions   Corn Oil Rash   Other Rash    PT STATES SHE IS ALLERGIC TO MOST FOODS-STATES IT WAS FOUND IN ALLERGY TESTING   Peanut Allergen Powder-Dnfp Rash   Peanut-Containing Drug Products Rash   Penicillins Rash  Has patient had a PCN reaction causing immediate rash, facial/tongue/throat swelling, SOB or lightheadedness with hypotension: Yes Has patient had a PCN reaction causing severe rash involving mucus membranes or skin necrosis: No Has patient had a PCN reaction that required hospitalization No Has patient had a PCN reaction occurring within the last 10 years: No. Has not had in 40-50 years If all of the above answers are "NO", then may proceed with Cephalosporin use.    ROS Review of Systems  Constitutional:  Negative for fever.  Respiratory:  Negative for chest tightness and shortness of breath.   Endocrine: Negative for cold intolerance, heat intolerance, polydipsia, polyphagia and polyuria.  Genitourinary:  Negative for frequency and hematuria.  Neurological:  Negative for dizziness and headaches.      Objective:    Physical Exam Cardiovascular:     Pulses: Normal pulses.     Heart sounds: Normal heart sounds.  Pulmonary:     Effort: Pulmonary effort is normal.     Breath sounds: Normal breath sounds.  Feet:     Right foot:     Toenail Condition: Fungal disease present. Neurological:     Mental Status: She is alert.     BP (!) 169/70   Pulse 73   Ht 5' (1.524 m)   Wt 273 lb 3.2 oz (123.9 kg)   SpO2 96%   BMI 53.36 kg/m  Wt Readings from Last 3 Encounters:  11/22/21 273 lb 3.2 oz (123.9 kg)  11/14/21 274 lb (124.3 kg)  11/02/21 274 lb (124.3 kg)    Lab Results  Component Value Date   TSH 5.750 (H) 11/04/2021   Lab Results  Component Value Date   WBC 7.1 11/04/2021   HGB 11.0 (L) 11/04/2021   HCT 35.3 11/04/2021   MCV 82 11/04/2021   PLT 304 11/04/2021   Lab  Results  Component Value Date   NA 140 11/04/2021   K 5.1 11/04/2021   CO2 19 (L) 11/04/2021   GLUCOSE 203 (H) 11/04/2021   BUN 30 (H) 11/04/2021   CREATININE 1.66 (H) 11/04/2021   BILITOT 0.2 11/04/2021   ALKPHOS 147 (H) 11/04/2021   AST 19 11/04/2021   ALT 17 11/04/2021   PROT 6.8 11/04/2021   ALBUMIN 4.2 11/04/2021   CALCIUM 9.5 11/04/2021   ANIONGAP 10 09/15/2020   EGFR 32 (L) 11/04/2021   Lab Results  Component Value Date   CHOL 126 11/04/2021   Lab Results  Component Value Date   HDL 43 11/04/2021   Lab Results  Component Value Date   LDLCALC 59 11/04/2021   Lab Results  Component Value Date   TRIG 134 11/04/2021   Lab Results  Component Value Date   CHOLHDL 2.9 11/04/2021   Lab Results  Component Value Date   HGBA1C 10.5 11/02/2021      Assessment & Plan:   Problem List Items Addressed This Visit       Cardiovascular and Mediastinum   Essential hypertension - Primary    elevated BP today of 169/70. Reports eating hamburgers with added salt and a tomato sandwich with added salt on 11/21/21--report compliance with antihypertensive.  BP Readings from Last 3 Encounters:  11/22/21 (!) 169/70  11/14/21 131/70  11/02/21 (!) 146/88  Dash diet recommended  Encouraged to decrease salt intake  No changes to the medication regimen today Will reevaluate BP in 1 month           Endocrine   DM2 (diabetes mellitus, type 2) (  HCC)    HX of uncontrolled symptomatic type 2 DM since 76 years of age Reports checking BG 4X daily but forgot to bring her CGM and logs today She was started on Marjuoro on 11/02/21 Current HgA1C is 10.5 She follows up with endocrinologist        Hypothyroidism    Elevated TSH with normal T4  on 11/04/21 Notes to be taking her Synthroid with food along with her other medications Advised to taken Synthroid on an empty stomach in the morning No changes to thyroid medications today Will reevaluate labs in 1 month      Relevant  Medications   levothyroxine (SYNTHROID) 50 MCG tablet     Musculoskeletal and Integument   Onychomycosis of right great toe    reports using terbinafine hydrochloride cream 1% for 6 months to a year with minimum relief Topical Lamisil ordered       Relevant Medications   terbinafine (LAMISIL AT) 1 % cream     Other   Weight gain    She has lost 1 lb since starting Marjuoro Encouraged increasing physical activities and a heart-healthy diet      Other Visit Diagnoses     TSH (thyroid-stimulating hormone deficiency)       Relevant Medications   levothyroxine (SYNTHROID) 50 MCG tablet   TSH elevation       Relevant Orders   TSH + free T4       Meds ordered this encounter  Medications   terbinafine (LAMISIL AT) 1 % cream    Sig: Apply 1 Application topically 2 (two) times daily.    Dispense:  30 g    Refill:  0   DISCONTD: levothyroxine (SYNTHROID) 75 MCG tablet    Sig: Take 1 tablet (75 mcg total) by mouth daily.    Dispense:  90 tablet    Refill:  3   levothyroxine (SYNTHROID) 50 MCG tablet    Sig: Take 1 tablet (50 mcg total) by mouth daily.    Dispense:  90 tablet    Refill:  2    Follow-up: Return in about 1 month (around 12/23/2021) for BP.      , FNP 

## 2021-11-22 NOTE — Assessment & Plan Note (Addendum)
elevated BP today of 169/70. Reports eating hamburgers with added salt and a tomato sandwich with added salt on 11/21/21--report compliance with antihypertensive.  BP Readings from Last 3 Encounters:  11/22/21 (!) 169/70  11/14/21 131/70  11/02/21 (!) 146/88  Dash diet recommended  Encouraged to decrease salt intake  No changes to the medication regimen today Will reevaluate BP in 1 month

## 2021-11-22 NOTE — Patient Instructions (Addendum)
I appreciate the opportunity to provide care to you today!    Follow up:  1 months   Please pick your prescriptions at the pharmacy  -Take synthroid on an empty stomach 30-60 minutes before food intake to avoid erratic absorption of the hormones.      Please continue to a heart-healthy diet and increase your physical activities. Try to exercise for 11mns at least three times a week.      It was a pleasure to see you and I look forward to continuing to work together on your health and well-being. Please do not hesitate to call the office if you need care or have questions about your care.   Have a wonderful day and week. With Gratitude, GAlvira MondayMSN, FNP-BC

## 2021-11-23 NOTE — Assessment & Plan Note (Signed)
reports using terbinafine hydrochloride cream 1% for 6 months to a year with minimum relief Topical Lamisil ordered

## 2021-11-23 NOTE — Assessment & Plan Note (Signed)
HX of uncontrolled symptomatic type 2 DM since 76 years of age Reports checking BG 4X daily but forgot to bring her CGM and logs today She was started on Marjuoro on 11/02/21 Current HgA1C is 10.5 She follows up with endocrinologist

## 2021-11-23 NOTE — Assessment & Plan Note (Signed)
She has lost 1 lb since starting Marjuoro Encouraged increasing physical activities and a heart-healthy diet

## 2021-11-25 ENCOUNTER — Other Ambulatory Visit: Payer: Self-pay | Admitting: Family Medicine

## 2021-11-25 DIAGNOSIS — E039 Hypothyroidism, unspecified: Secondary | ICD-10-CM | POA: Insufficient documentation

## 2021-11-25 NOTE — Assessment & Plan Note (Signed)
Elevated TSH with normal T4  on 11/04/21 Notes to be taking her Synthroid with food along with her other medications Advised to taken Synthroid on an empty stomach in the morning No changes to thyroid medications today Will reevaluate labs in 1 month

## 2021-12-09 NOTE — Progress Notes (Signed)
noted 

## 2021-12-13 ENCOUNTER — Other Ambulatory Visit: Payer: Self-pay

## 2021-12-13 MED ORDER — FUROSEMIDE 20 MG PO TABS: 20.0000 mg | ORAL_TABLET | Freq: Every day | ORAL | 1 refills | Status: DC

## 2021-12-13 MED ORDER — OLMESARTAN MEDOXOMIL 20 MG PO TABS: 20.0000 mg | ORAL_TABLET | Freq: Every day | ORAL | 1 refills | Status: DC

## 2022-01-01 ENCOUNTER — Other Ambulatory Visit: Payer: Self-pay | Admitting: Nurse Practitioner

## 2022-01-03 ENCOUNTER — Other Ambulatory Visit: Payer: Self-pay | Admitting: Cardiovascular Disease

## 2022-01-13 ENCOUNTER — Encounter: Payer: Self-pay | Admitting: Cardiology

## 2022-01-13 ENCOUNTER — Ambulatory Visit: Payer: Medicare Other | Attending: Cardiology | Admitting: Cardiology

## 2022-01-13 VITALS — BP 128/70 | HR 81 | Ht 60.0 in | Wt 277.0 lb

## 2022-01-13 DIAGNOSIS — R0602 Shortness of breath: Secondary | ICD-10-CM

## 2022-01-13 MED ORDER — FUROSEMIDE 20 MG PO TABS: 20.0000 mg | ORAL_TABLET | Freq: Every day | ORAL | 1 refills | Status: DC

## 2022-01-13 NOTE — Patient Instructions (Signed)
Medication Instructions:  Change your Lasix to '20mg'$  daily, may take extra '20mg'$  as needed for severe swelling  Continue all other medications.     Labwork: none  Testing/Procedures: none  Follow-Up: As needed   Any Other Special Instructions Will Be Listed Below (If Applicable).   If you need a refill on your cardiac medications before your next appointment, please call your pharmacy.

## 2022-01-13 NOTE — Progress Notes (Signed)
Clinical Summary Jessica Strong is a 76 y.o.female seen today for follow up of the following medical problems.    1. SOB/Chest pain - secondhand smoke exposure with husband. +cough at times. Occasional wheezing. Since last visit she had normal PFTs.  - echo 12/2012 [facility]: technically difficult. LVEF 55-60%, abnormal diastolic function.  - negative exercise cardiolite in 2016 - 10/2015 echo LVEF 93-81%, grade I diastolic dysfunction  LHC/RHC in 11/2015. No significant CAD, normal filling pressures. PCWP 7, mean PA 15.    - 06/2017 [facility] wth chest pain.Mild trop elevation up to 0.19 (did not establish clear peak). Echo at that time LVEF 55-60%.  06/2017 nuclear stress: mild to moderate anterior ischemia, low to intermediate risk - 06/2017 cath normal coronaries.      - dull pain down entire arm, numbness. Can last several hours, not worst with postion.  - SOB has progressed. DOE walking room to room at home - some recent edema x few years that is progressing    09/2021 echo: LVEF 70-75%, no WMAs, normal diastolic fxn - chronic SOB/DOE. Recent weight gain, 40 lbs over the last year.     2. HTN - she is compliant with meds     3. OSA - compliant with cpap   Past Medical History:  Diagnosis Date   Allergy    Anemia    Anxiety    Arthritis    Cataract    cervical ca 1990   Depression    Diabetes ([place])    Hypercholesterolemia    Hypertension    Myocardial infarction [facility])    Sleep apnea    on CPAP 14 mm PS since February 2017   Stroke [facility])    weakness on R side   Vitamin D deficiency      Allergies  Allergen Reactions   Corn Oil Rash   Other Rash    PT STATES SHE IS ALLERGIC TO MOST FOODS-STATES IT WAS FOUND IN ALLERGY TESTING   Peanut Allergen Powder-Dnfp Rash   Peanut-Containing Drug Products Rash   Penicillins Rash    Has patient had a PCN reaction causing immediate rash, facial/tongue/throat swelling, SOB or lightheadedness with  hypotension: Yes Has patient had a PCN reaction causing severe rash involving mucus membranes or skin necrosis: No Has patient had a PCN reaction that required hospitalization No Has patient had a PCN reaction occurring within the last 10 years: No. Has not had in 40-50 years If all of the above answers are "NO", then may proceed with Cephalosporin use.     Current Outpatient Medications  Medication Sig Dispense Refill   furosemide (LASIX) 20 MG tablet TAKE 1 TABLET BY MOUTH DAILY 60 tablet 5   olmesartan (BENICAR) 20 MG tablet TAKE 1 TABLET BY MOUTH DAILY 60 tablet 5   ACCU-CHEK AVIVA PLUS test strip Inject 1 strip as directed daily.  4   acetaminophen (TYLENOL) 500 MG tablet Take 500 mg by mouth every 6 (six) hours as needed for mild pain or headache. As needed.     aspirin 81 MG EC tablet Take 325 mg by mouth daily.     Calcium Carb-Cholecalciferol (CALCIUM 600-D PO) Take 1 tablet by mouth 2 (two) times a week.     EPINEPHrine 0.3 mg/0.3 mL IJ SOAJ injection Inject 0.3 mg into the muscle as needed for anaphylaxis.     ferrous sulfate 325 (65 FE) MG tablet Take 325 mg by mouth daily with breakfast.  HUMALOG KWIKPEN 100 UNIT/ML KwikPen Inject 5-11 Units into the skin 3 (three) times daily with meals.     Lancets Thin MISC Inject 1 Stick into the skin daily.     levothyroxine (SYNTHROID) 50 MCG tablet Take 1 tablet (50 mcg total) by mouth daily. 90 tablet 2   MOUNJARO 5 MG/0.5ML Pen INJECT 5 MG INTO THE SKIN ONCE  WEEKLY 6 mL 0   Multiple Vitamins-Minerals (MULTIVITAMIN ADULT) CHEW Chew 2 tablets by mouth daily at 12 noon.     NOVOFINE PEN NEEDLE 32G X 6 MM MISC Inject into the skin.     OVER THE COUNTER MEDICATION in the morning and at bedtime. Vit D 3(2000)  once two times per day     potassium chloride (KLOR-CON M) 10 MEQ tablet Take 10 mEq by mouth daily.     simvastatin (ZOCOR) 20 MG tablet Take 20 mg by mouth at bedtime.     terbinafine (LAMISIL AT) 1 % cream Apply 1 Application  topically 2 (two) times daily. 30 g 0   tolterodine (DETROL LA) 4 MG 24 hr capsule Take 1 capsule (4 mg total) by mouth daily. 30 capsule 11   TRESIBA FLEXTOUCH 200 UNIT/ML FlexTouch Pen Inject 56 Units into the skin at bedtime. 45 mL 0   No current facility-administered medications for this visit.     Past Surgical History:  Procedure Laterality Date   ABDOMINAL HYSTERECTOMY     BARIATRIC SURGERY     BIOPSY  09/17/2020   Procedure: BIOPSY;  Surgeon: Harvel Quale, MD;  [facility];  Service: Gastroenterology;;   CARDIAC CATHETERIZATION N/A 12/24/2015   Procedure: Right/Left Heart Cath and Coronary Angiography;  Surgeon: Jettie Booze, MD;  Location: [facility];  Service: Cardiovascular;  Laterality: N/A;   COLONOSCOPY  2018   COLONOSCOPY WITH PROPOFOL N/A 09/17/2020   Procedure: COLONOSCOPY WITH PROPOFOL;  Surgeon: Harvel Quale, MD;  [facility];  Service: Gastroenterology;  Laterality: N/A;  9:00   ESOPHAGOGASTRODUODENOSCOPY (EGD) WITH PROPOFOL N/A 09/17/2020   Procedure: ESOPHAGOGASTRODUODENOSCOPY (EGD) WITH PROPOFOL;  Surgeon: Harvel Quale, MD;  [facility];  Service: Gastroenterology;  Laterality: N/A;   HERNIA REPAIR     LEFT HEART CATH AND CORONARY ANGIOGRAPHY N/A 07/12/2017   Procedure: LEFT HEART CATH AND CORONARY ANGIOGRAPHY;  Surgeon: Belva Crome, MD;  Location: [place] [facility];  Service: Cardiovascular;  Laterality: N/A;     Allergies  Allergen Reactions   Corn Oil Rash   Other Rash    PT STATES SHE IS ALLERGIC TO MOST FOODS-STATES IT WAS FOUND IN ALLERGY TESTING   Peanut Allergen Powder-Dnfp Rash   Peanut-Containing Drug Products Rash   Penicillins Rash    Has patient had a PCN reaction causing immediate rash, facial/tongue/throat swelling, SOB or lightheadedness with hypotension: Yes Has patient had a PCN reaction causing severe rash involving mucus membranes or skin necrosis:  No Has patient had a PCN reaction that required hospitalization No Has patient had a PCN reaction occurring within the last 10 years: No. Has not had in 40-50 years If all of the above answers are "NO", then may proceed with Cephalosporin use.      Family History  Problem Relation Age of Onset   Cancer Mother    Colon cancer Mother    Diabetes Mother    Hypertension Father    Thyroid disease Father    Thyroid cancer Father    Diabetes  Father    Esophageal cancer Father    Breast cancer Maternal Aunt    Colon cancer Maternal Aunt    Breast cancer Maternal Aunt    Colon cancer Maternal Grandmother    Stroke Maternal Grandfather    Stomach cancer Paternal Grandfather    Colon cancer Son    Colon polyps Neg Hx    Rectal cancer Neg Hx      Social History Jessica Strong reports that she has never smoked. She has never used smokeless tobacco. Jessica Strong reports that she does not currently use alcohol.   Review of Systems CONSTITUTIONAL: No weight loss, fever, chills, weakness or fatigue.  HEENT: Eyes: No visual loss, blurred vision, double vision or yellow sclerae.No hearing loss, sneezing, congestion, runny nose or sore throat.  SKIN: No rash or itching.  CARDIOVASCULAR: no chest pain, no palpitations.  RESPIRATORY: per hpi GASTROINTESTINAL: No anorexia, nausea, vomiting or diarrhea. No abdominal pain or blood.  GENITOURINARY: No burning on urination, no polyuria NEUROLOGICAL: No headache, dizziness, syncope, paralysis, ataxia, numbness or tingling in the extremities. No change in bowel or bladder control.  MUSCULOSKELETAL: No muscle, back pain, joint pain or stiffness.  LYMPHATICS: No enlarged nodes. No history of splenectomy.  PSYCHIATRIC: No history of depression or anxiety.  ENDOCRINOLOGIC: No reports of sweating, cold or heat intolerance. No polyuria or polydipsia.  [name]   Physical Examination Today's Vitals   01/13/22 1509  BP: 128/70  Pulse: 81  SpO2: 96%  Weight: 277  lb (125.6 kg)  Height: 5' (1.524 m)   Body mass index is 54.1 kg/m.  Gen: resting comfortably, no acute distress HEENT: no scleral icterus, pupils equal round and reactive, no palptable cervical adenopathy,  CV: RRR, no mrg, no jvd Resp: Clear to auscultation bilaterally GI: abdomen is soft, non-tender, non-distended, normal bowel sounds, no hepatosplenomegaly MSK: extremities are warm, trace bilateral edema  Skin: warm, no rash Neuro:  no focal deficits Psych: appropriate affect   Diagnostic Studies  10/2015 echo Study Conclusions   - Left ventricle: The cavity size was normal. Systolic function was   normal. The estimated ejection fraction was in the range of 60%   to 65%. Mild concentric and moderate focal basal septal   hypertrophy. Wall motion was normal; there were no regional wall   motion abnormalities. Doppler parameters are consistent with   abnormal left ventricular relaxation (grade 1 diastolic   dysfunction). - Aortic valve: Trileaflet; mildly thickened leaflets.   11/2015 Cath LV end diastolic pressure is normal. Normal right heart pressures. Low pulmonary artery saturation, 52%. CO 3.9 L/min; Cardiac index 2.0 No angiographically apparent coronary artery disease. Tortuous right innominate by selective angiogram.   Normal LVEF by echo.  Unclear etiology of low PA saturation.  No ventriculogram due to renal insufficiency.  Continue medical therapy, treatment of OSA and weight loss.   06/2017 nuclear stress There was no ST segment deviation noted during stress. The left ventricular ejection fraction is hyperdynamic (>65%). Findings consistent with mild to moderate anterior ischemia. Low to intermediate risk stress test.   06/2017 cath Normal coronary arteries. Normal left ventricular size and function with normal hemodynamics. False positive nuclear study   RECOMMENDATIONS:   No further cardiac ischemic evaluation.  Nuclear study is felt to be false  positive.   09/2021 echo IMPRESSIONS     1. Left ventricular ejection fraction, by estimation, is 70 to 75%. The  left ventricle has hyperdynamic function. The left ventricle has no  regional  wall motion abnormalities. There is mild left ventricular  hypertrophy. Left ventricular diastolic  parameters were normal. The average left ventricular global longitudinal  strain is -20.1 %. The global longitudinal strain is normal.   2. Right ventricular systolic function is normal. The right ventricular  size is normal. Tricuspid regurgitation signal is inadequate for assessing  PA pressure.   3. The mitral valve is grossly normal. Trivial mitral valve  regurgitation.   4. The aortic valve is tricuspid. Aortic valve regurgitation is not  visualized.   5. The inferior vena cava is normal in size with greater than 50%  respiratory variability, suggesting right atrial pressure of 3 mmHg.   Assessment and Plan   1. Chest pain/SOB - extensive history of symptoms. Extensive cardiac testing in 2017 and 2019 have been benign, including caths with normal arteries and filling pressures. Prior RHC without any significant pulm HTN - prior PFTs were benign, no major findings on CXR -most recent echo 09/2021 was benign - up 40 lbs since last year, symptoms likely deconditioning and weight gain related. Has never had signs of signficant cardiac pathology - no further workup indicated, encouraged regular exercise/conditioning routine.      2.HTN  -at goal, continue current meds     [name], M.D.

## 2022-01-19 ENCOUNTER — Encounter (INDEPENDENT_AMBULATORY_CARE_PROVIDER_SITE_OTHER): Payer: Self-pay | Admitting: Gastroenterology

## 2022-01-19 ENCOUNTER — Ambulatory Visit (INDEPENDENT_AMBULATORY_CARE_PROVIDER_SITE_OTHER): Payer: Medicare Other | Admitting: Gastroenterology

## 2022-01-19 VITALS — BP 139/68 | HR 72 | Temp 98.2°F | Ht 60.0 in | Wt 279.1 lb

## 2022-01-19 DIAGNOSIS — K5900 Constipation, unspecified: Secondary | ICD-10-CM

## 2022-01-19 MED ORDER — LUBIPROSTONE 8 MCG PO CAPS: 8.0000 ug | ORAL_CAPSULE | Freq: Two times a day (BID) | ORAL | 1 refills | Status: DC

## 2022-01-19 NOTE — Patient Instructions (Signed)
-  I am going to start you on Amitiza 33mg twice a day, this is for your constipation, you can stop other constipation medications at this time, you may have some looser stools when starting this which is normal, unless you are having more than 4 watery bowel movements a day, it is okay for you to have looser stools. If no improvement in BMs within the next 2-3 weeks on this medication, please let me know  -Continue with good water intake  -Discuss weight loss options with PCP   Follow up 3 months

## 2022-01-19 NOTE — Progress Notes (Signed)
Referring Provider: Glenda Chroman, MD Primary Care Physician:  Alvira Monday, FNP Primary GI Physician: Jenetta Downer   Chief Complaint  Patient presents with   Constipation    Follow up on abdominal cramping and constipation. Takes dulcolax every 2 -3 days. Abdominal cramping is about the same. Having swelling in abdomen.  No appetite.    HPI:   Jessica Strong is a 76 y.o. female with past medical history of obesity status post Roux-en-Y gastric bypass, anxiety, depression, diabetes, hyperlipidemia, hypertension, myocardial infarction, stroke  Patient presenting today for follow up of abdominal pain and constipation.  Last seen march 2023, at that time having continued constipation, taking a stool softener twice weekly for with BM 4x/week, having some mid abdominal pain and sometimes RLQ cramping. Occasional nausa with vomiting every 2-3 months, unsure at that time if she was taking previously prescribed bentyl. Reported 40 ib weight gain. Continued with SOB, previously seen cards and Pulm without significant findings to explain symptoms.  Recommended to start miralax, CT A/P with contrast, bentyl '10mg'$  BID, diet high in fruits, veggies, whole grains and good water intake.  CT mostly unremarkable, hepatic steatosis, sigmoid diverticulosis and small non obstructive left renal calculi  Present: Recent labs with TSH 5.75 in July 2023  Hgb 11 (around baseline) previous iron studies in June 2022 were WNL, patient anemic at that time, she is maintained on PO iron.   She states that PCP is monitoring TSH and trying to adjust her medications for more optimal results.   She states she is taking dulcolax every 2-3 days, thinks she did try miralax in the past but is unsure if this worked. She is having a BM only when she takes the dulcolax. Stools are hard initially then easier to pass thereafter, she reports that she has up to 3 larger BMs when she does take the dulcolax. She denies abdominal pain  but feels that her abdomen is enlarged. She continues to have SOB despite previous unremarkable workup. Denies blood in stool or melena. Water intake is very good with around 90oz per day. She is trying to get into a weight loss program to help with her weight as she has gained 40 pounds.   EGD:09/17/20- Normal esophagus. - Gastric mucosal atrophy. Biopsied-H pylori positive (neg stool testing in aug 2022) - Normal examined duodenum. Biopsied. Last Colonoscopy: 09/17/20- A single non-bleeding colonic angiodysplastic lesion. - One 2 mm polyp in the ascending colon-tubular adenoma - Diverticulosis in the sigmoid colon and in the descending colon. - Non-bleeding internal hemorrhoids.    Past Medical History:  Diagnosis Date   Allergy    Anemia    Anxiety    Arthritis    Cataract    cervical ca 1990   Depression    Diabetes ([place])    Hypercholesterolemia    Hypertension    Myocardial infarction ([place])    Sleep apnea    on CPAP 14 mm PS since February 2017   Stroke [facility])    weakness on R side   Vitamin D deficiency     Past Surgical History:  Procedure Laterality Date   ABDOMINAL HYSTERECTOMY     BARIATRIC SURGERY     BIOPSY  09/17/2020   Procedure: BIOPSY;  Surgeon: Harvel Quale, MD;  [facility];  Service: Gastroenterology;;   CARDIAC CATHETERIZATION N/A 12/24/2015   Procedure: Right/Left Heart Cath and Coronary Angiography;  Surgeon: Jettie Booze, MD;  Location: [place] [facility];  Service:  Cardiovascular;  Laterality: N/A;   COLONOSCOPY  2018   COLONOSCOPY WITH PROPOFOL N/A 09/17/2020   Procedure: COLONOSCOPY WITH PROPOFOL;  Surgeon: Harvel Quale, MD;  [facility];  Service: Gastroenterology;  Laterality: N/A;  9:00   ESOPHAGOGASTRODUODENOSCOPY (EGD) WITH PROPOFOL N/A 09/17/2020   Procedure: ESOPHAGOGASTRODUODENOSCOPY (EGD) WITH PROPOFOL;  Surgeon: Harvel Quale, MD;  [facility];  Service:  Gastroenterology;  Laterality: N/A;   HERNIA REPAIR     LEFT HEART CATH AND CORONARY ANGIOGRAPHY N/A 07/12/2017   Procedure: LEFT HEART CATH AND CORONARY ANGIOGRAPHY;  Surgeon: Belva Crome, MD;  Location: [place] [facility];  Service: Cardiovascular;  Laterality: N/A;    Current Outpatient Medications  Medication Sig Dispense Refill   ACCU-CHEK AVIVA PLUS test strip Inject 1 strip as directed daily.  4   acetaminophen (TYLENOL) 500 MG tablet Take 500 mg by mouth every 6 (six) hours as needed for mild pain or headache. As needed.     aspirin 325 MG tablet Take 325 mg by mouth daily.     Calcium Carb-Cholecalciferol (CALCIUM 600-D PO) Take 1 tablet by mouth daily.     EPINEPHrine 0.3 mg/0.3 mL IJ SOAJ injection Inject 0.3 mg into the muscle as needed for anaphylaxis.     ferrous sulfate 325 (65 FE) MG tablet Take 325 mg by mouth daily with breakfast.     furosemide (LASIX) 20 MG tablet Take 1 tablet (20 mg total) by mouth daily. (May take extra '20mg'$  as needed for severe swelling.) 01/13/2022 135 tablet 1   HUMALOG KWIKPEN 100 UNIT/ML KwikPen Inject 5-11 Units into the skin 3 (three) times daily with meals.     Lancets Thin MISC Inject 1 Stick into the skin daily.     levothyroxine (SYNTHROID) 50 MCG tablet Take 1 tablet (50 mcg total) by mouth daily. 90 tablet 2   MOUNJARO 5 MG/0.5ML Pen INJECT 5 MG INTO THE SKIN ONCE  WEEKLY 6 mL 0   Multiple Vitamins-Minerals (MULTIVITAMIN ADULT) CHEW Chew 1 tablet by mouth daily at 12 noon.     NOVOFINE PEN NEEDLE 32G X 6 MM MISC Inject into the skin.     olmesartan (BENICAR) 20 MG tablet TAKE 1 TABLET BY MOUTH DAILY 60 tablet 5   potassium chloride (KLOR-CON M) 10 MEQ tablet Take 10 mEq by mouth daily.     simvastatin (ZOCOR) 20 MG tablet Take 20 mg by mouth at bedtime.     terbinafine (LAMISIL AT) 1 % cream Apply 1 Application topically 2 (two) times daily. 30 g 0   tolterodine (DETROL LA) 4 MG 24 hr capsule Take 1 capsule (4 mg total) by mouth daily. 30  capsule 11   TRESIBA FLEXTOUCH 200 UNIT/ML FlexTouch Pen Inject 56 Units into the skin at bedtime. 45 mL 0   No current facility-administered medications for this visit.    Allergies as of 01/19/2022 - Review Complete 01/19/2022  Allergen Reaction Noted   Corn oil Rash 12/26/2017   Other Rash 12/22/2015   Peanut allergen powder-dnfp Rash 12/26/2017   Peanut-containing drug products Rash 12/22/2015   Penicillins Rash     Family History  Problem Relation Age of Onset   Cancer Mother    Colon cancer Mother    Diabetes Mother    Hypertension Father    Thyroid disease Father    Thyroid cancer Father    Diabetes Father    Esophageal cancer Father    Breast cancer Maternal Aunt  Colon cancer Maternal Aunt    Breast cancer Maternal Aunt    Colon cancer Maternal Grandmother    Stroke Maternal Grandfather    Stomach cancer Paternal Grandfather    Colon cancer Son    Colon polyps Neg Hx    Rectal cancer Neg Hx     Social History   Socioeconomic History   Marital status: Married    Spouse name: Not on file   Number of children: 2   Years of education: Not on file   Highest education level: Not on file  Occupational History   Occupation: retired  Tobacco Use   Smoking status: Never    Passive exposure: Never   Smokeless tobacco: Never  Vaping Use   Vaping Use: Never used  Substance and Sexual Activity   Alcohol use: Not Currently    Comment: occasionally   Drug use: No   Sexual activity: Never    Partners: Male  Other Topics Concern   Not on file  Social History Narrative   Not on file   Social Determinants of Health   Financial Resource Strain: Not on file  Food Insecurity: Not on file  Transportation Needs: Not on file  Physical Activity: Not on file  Stress: Not on file  Social Connections: Not on file   Review of systems General: negative for malaise, night sweats, fever, chills, weight loss +weight gain Neck: Negative for lumps, goiter, pain and  significant neck swelling Resp: Negative for cough, wheezing, +SOB CV: Negative for chest pain, leg swelling, palpitations, orthopnea GI: denies melena, hematochezia, nausea, vomiting, diarrhea, dysphagia, odyonophagia, early satiety or unintentional weight loss. +constipation  MSK: Negative for joint pain or swelling, back pain, and muscle pain. Derm: Negative for itching or rash Psych: Denies depression, anxiety, memory loss, confusion. No homicidal or suicidal ideation.  Heme: Negative for prolonged bleeding, bruising easily, and swollen nodes. Endocrine: Negative for cold or heat intolerance, polyuria, polydipsia and goiter. Neuro: negative for tremor, gait imbalance, syncope and seizures. The remainder of the review of systems is noncontributory.  Physical Exam: BP 139/68 (BP Location: Left Arm, Patient Position: Sitting, Cuff Size: Large)   Pulse 72   Temp 98.2 F (36.8 C) (Oral)   Ht 5' (1.524 m)   Wt 279 lb 1.6 oz (126.6 kg)   BMI 54.51 kg/m  General:   Alert and oriented. No distress noted. Pleasant and cooperative.  Head:  Normocephalic and atraumatic. Eyes:  Conjuctiva clear without scleral icterus. Mouth:  Oral mucosa pink and moist. Good dentition. No lesions. Heart: Normal rate and rhythm, s1 and s2 heart sounds present.  Lungs: Clear lung sounds in all lobes. Respirations equal and unlabored. Abdomen:  +BS, soft, non-tender and non-distended. No rebound or guarding. No HSM or masses noted. Derm: No palmar erythema or jaundice Msk:  Symmetrical without gross deformities. Normal posture. Extremities:  +edema Neurologic:  Alert and  oriented x4 Psych:  Alert and cooperative. Normal mood and affect.  Invalid input(s): "6 MONTHS"   ASSESSMENT: Jessica Strong is a 76 y.o. female presenting today for follow up of constipation.  Patient continues to have SOB despite previous workup with cardiology and pulmonology that was unremarkable. She also continues with  constipation, having a BM 2-3x/week only when she takes dulcolax, thinks she took miralax after last OV without results. Denies rectal bleeding, melena, abdominal pain nausea or vomiting. Continues to feel that abdomen is enlarged. Has had recent EGD, colonoscopy and CT A/P all without findings to  explain etiology of her symptoms. Suspect that some of her symptoms are from ongoing constipation and infrequency of bowel movements. TSH also elevated indicating hypothyroidism which I discussed with her could be contributing to her constipation and weight gain, PCP is tweaking her thyroid medications. She is drinking a good amount of water. Will start Amitiza 65mg BID, I made her aware she may have looser stools on this mediation which is okay, no cause for concern unless she is having 4 or more watery BMs per day, she should let me know how she is doing on this in the next 2-3 weeks.    PLAN:  Amitiza 846m BID 2.   Continue with good water intake  3. Discuss weight loss options with PCP   All questions were answered, patient verbalized understanding and is in agreement with plan as outlined above.   Follow Up: 3 months   Cheyene Hamric L. CaAlver SorrowMSN, APRN, AGNP-C Adult-Gerontology Nurse Practitioner [facility]

## 2022-02-02 ENCOUNTER — Encounter: Payer: Self-pay | Admitting: Nurse Practitioner

## 2022-02-02 ENCOUNTER — Ambulatory Visit: Payer: Medicare Other | Admitting: Nurse Practitioner

## 2022-02-02 VITALS — Ht 60.0 in | Wt 281.6 lb

## 2022-02-02 DIAGNOSIS — N1832 Chronic kidney disease, stage 3b: Secondary | ICD-10-CM

## 2022-02-02 DIAGNOSIS — I10 Essential (primary) hypertension: Secondary | ICD-10-CM

## 2022-02-02 DIAGNOSIS — Z794 Long term (current) use of insulin: Secondary | ICD-10-CM | POA: Diagnosis not present

## 2022-02-02 DIAGNOSIS — E1122 Type 2 diabetes mellitus with diabetic chronic kidney disease: Secondary | ICD-10-CM | POA: Diagnosis not present

## 2022-02-02 DIAGNOSIS — E782 Mixed hyperlipidemia: Secondary | ICD-10-CM

## 2022-02-02 DIAGNOSIS — Z808 Family history of malignant neoplasm of other organs or systems: Secondary | ICD-10-CM | POA: Diagnosis not present

## 2022-02-02 DIAGNOSIS — E039 Hypothyroidism, unspecified: Secondary | ICD-10-CM

## 2022-02-02 LAB — POCT GLYCOSYLATED HEMOGLOBIN (HGB A1C): Hemoglobin A1C: 9.1 % — AB (ref 4.0–5.6)

## 2022-02-02 MED ORDER — LEVOTHYROXINE SODIUM 75 MCG PO TABS: 75.0000 ug | ORAL_TABLET | Freq: Every day | ORAL | 1 refills | Status: DC

## 2022-02-02 MED ORDER — TIRZEPATIDE 7.5 MG/0.5ML ~~LOC~~ SOAJ: 7.5000 mg | SUBCUTANEOUS | 0 refills | Status: DC

## 2022-02-02 NOTE — Progress Notes (Signed)
Endocrinology Follow Up Note       02/02/2022, 3:52 PM   Subjective:    Patient ID: Jessica Strong, female    DOB: 05-09-1945.  Jessica Strong is being seen in follow up after being seen in consultation for management of currently uncontrolled symptomatic diabetes requested by  Alvira Monday, [place].   Past Medical History:  Diagnosis Date   Allergy    Anemia    Anxiety    Arthritis    Cataract    cervical ca 1990   Depression    Diabetes ([place])    Hypercholesterolemia    Hypertension    Myocardial infarction ([place])    Sleep apnea    on CPAP 14 mm PS since February 2017   Stroke [facility])    weakness on R side   Vitamin D deficiency     Past Surgical History:  Procedure Laterality Date   ABDOMINAL HYSTERECTOMY     BARIATRIC SURGERY     BIOPSY  09/17/2020   Procedure: BIOPSY;  Surgeon: Harvel Quale, MD;  [facility];  Service: Gastroenterology;;   CARDIAC CATHETERIZATION N/A 12/24/2015   Procedure: Right/Left Heart Cath and Coronary Angiography;  Surgeon: Jettie Booze, MD;  Location: [place] [facility];  Service: Cardiovascular;  Laterality: N/A;   COLONOSCOPY  2018   COLONOSCOPY WITH PROPOFOL N/A 09/17/2020   Procedure: COLONOSCOPY WITH PROPOFOL;  Surgeon: Harvel Quale, MD;  [facility];  Service: Gastroenterology;  Laterality: N/A;  9:00   ESOPHAGOGASTRODUODENOSCOPY (EGD) WITH PROPOFOL N/A 09/17/2020   Procedure: ESOPHAGOGASTRODUODENOSCOPY (EGD) WITH PROPOFOL;  Surgeon: Harvel Quale, MD;  [facility];  Service: Gastroenterology;  Laterality: N/A;   HERNIA REPAIR     LEFT HEART CATH AND CORONARY ANGIOGRAPHY N/A 07/12/2017   Procedure: LEFT HEART CATH AND CORONARY ANGIOGRAPHY;  Surgeon: Belva Crome, MD;  Location: [place] [facility];  Service: Cardiovascular;  Laterality: N/A;    Social History   Socioeconomic History    Marital status: Married    Spouse name: Not on file   Number of children: 2   Years of education: Not on file   Highest education level: Not on file  Occupational History   Occupation: retired  Tobacco Use   Smoking status: Never    Passive exposure: Never   Smokeless tobacco: Never  Vaping Use   Vaping Use: Never used  Substance and Sexual Activity   Alcohol use: Not Currently    Comment: occasionally   Drug use: No   Sexual activity: Never    Partners: Male  Other Topics Concern   Not on file  Social History Narrative   Not on file   Social Determinants of Health   Financial Resource Strain: Not on file  Food Insecurity: Not on file  Transportation Needs: Not on file  Physical Activity: Not on file  Stress: Not on file  Social Connections: Not on file    Family History  Problem Relation Age of Onset   Cancer Mother    Colon cancer Mother    Diabetes Mother    Hypertension Father    Thyroid disease Father  Thyroid cancer Father    Diabetes Father    Esophageal cancer Father    Breast cancer Maternal Aunt    Colon cancer Maternal Aunt    Breast cancer Maternal Aunt    Colon cancer Maternal Grandmother    Stroke Maternal Grandfather    Stomach cancer Paternal Grandfather    Colon cancer Son    Colon polyps Neg Hx    Rectal cancer Neg Hx     Outpatient Encounter Medications as of 02/02/2022  Medication Sig   ACCU-CHEK AVIVA PLUS test strip Inject 1 strip as directed daily.   acetaminophen (TYLENOL) 500 MG tablet Take 500 mg by mouth every 6 (six) hours as needed for mild pain or headache. As needed.   aspirin 325 MG tablet Take 325 mg by mouth daily.   Calcium Carb-Cholecalciferol (CALCIUM 600-D PO) Take 1 tablet by mouth daily.   EPINEPHrine 0.3 mg/0.3 mL IJ SOAJ injection Inject 0.3 mg into the muscle as needed for anaphylaxis.   furosemide (LASIX) 20 MG tablet Take 1 tablet (20 mg total) by mouth daily. (May take extra '20mg'$  as needed for severe  swelling.) 01/13/2022   HUMALOG KWIKPEN 100 UNIT/ML KwikPen Inject 5-11 Units into the skin 3 (three) times daily with meals.   Lancets Thin MISC Inject 1 Stick into the skin daily.   lubiprostone (AMITIZA) 8 MCG capsule Take 1 capsule (8 mcg total) by mouth 2 (two) times daily with a meal.   Multiple Vitamins-Minerals (MULTIVITAMIN ADULT) CHEW Chew 1 tablet by mouth daily at 12 noon.   NOVOFINE PEN NEEDLE 32G X 6 MM MISC Inject into the skin.   olmesartan (BENICAR) 20 MG tablet TAKE 1 TABLET BY MOUTH DAILY   potassium chloride (KLOR-CON M) 10 MEQ tablet Take 10 mEq by mouth daily.   simvastatin (ZOCOR) 20 MG tablet Take 20 mg by mouth at bedtime.   terbinafine (LAMISIL AT) 1 % cream Apply 1 Application topically 2 (two) times daily.   tirzepatide (MOUNJARO) 7.5 MG/0.5ML Pen Inject 7.5 mg into the skin once a week.   tolterodine (DETROL LA) 4 MG 24 hr capsule Take 1 capsule (4 mg total) by mouth daily.   TRESIBA FLEXTOUCH 200 UNIT/ML FlexTouch Pen Inject 56 Units into the skin at bedtime.   [DISCONTINUED] MOUNJARO 5 MG/0.5ML Pen INJECT 5 MG INTO THE SKIN ONCE  WEEKLY   ferrous sulfate 325 (65 FE) MG tablet Take 325 mg by mouth daily with breakfast.   levothyroxine (SYNTHROID) 75 MCG tablet Take 1 tablet (75 mcg total) by mouth daily before breakfast.   [DISCONTINUED] levothyroxine (SYNTHROID) 50 MCG tablet Take 1 tablet (50 mcg total) by mouth daily. (Patient not taking: Reported on 02/02/2022)   No facility-administered encounter medications on file as of 02/02/2022.    ALLERGIES: Allergies  Allergen Reactions   Corn Oil Rash   Other Rash    PT STATES SHE IS ALLERGIC TO MOST FOODS-STATES IT WAS FOUND IN ALLERGY TESTING   Peanut Allergen Powder-Dnfp Rash   Peanut-Containing Drug Products Rash   Penicillins Rash    Has patient had a PCN reaction causing immediate rash, facial/tongue/throat swelling, SOB or lightheadedness with hypotension: Yes Has patient had a PCN reaction causing severe  rash involving mucus membranes or skin necrosis: No Has patient had a PCN reaction that required hospitalization No Has patient had a PCN reaction occurring within the last 10 years: No. Has not had in 40-50 years If all of the above answers are "NO", then may  proceed with Cephalosporin use.    VACCINATION STATUS: Immunization History  Administered Date(s) Administered   Moderna Sars-Covid-2 Vaccination 07/11/2019, 08/08/2019, 08/11/2020    Diabetes She presents for her follow-up diabetic visit. She has type 2 diabetes mellitus. Onset time: Diagnosed at approx age of 88. Her disease course has been fluctuating. There are no hypoglycemic associated symptoms. Pertinent negatives for hypoglycemia include no sweats or tremors. Associated symptoms include fatigue, foot paresthesias, polydipsia and polyuria. There are no hypoglycemic complications. Symptoms are stable. Diabetic complications include a CVA, heart disease, nephropathy and peripheral neuropathy. Risk factors for coronary artery disease include diabetes mellitus, dyslipidemia, family history, obesity, hypertension, post-menopausal and sedentary lifestyle. Current diabetic treatment includes intensive insulin program and oral agent (monotherapy). She is compliant with treatment most of the time. Her weight is increasing steadily (gained 60+ lbs in last 6 months). She is following a generally unhealthy diet. When asked about meal planning, she reported none. She has not had a previous visit with a dietitian. She rarely participates in exercise. Her home blood glucose trend is fluctuating minimally. Her breakfast blood glucose range is generally >200 mg/dl. Her lunch blood glucose range is generally >200 mg/dl. Her dinner blood glucose range is generally >200 mg/dl. Her bedtime blood glucose range is generally >200 mg/dl. Her overall blood glucose range is >200 mg/dl. (She presents today with her CGM showing above target glycemic profile overall.   Her POCT A1c today is 9.1%, improving from last visit of 10.5%.  Analysis of her CGM shows TIR 15%, TAR 85%, TBR 0% with a GMI of 8.6%.  She notes she hardly eats and is frustrated that her weight continues to rise.  She appears significantly depressed, and her husband agrees.) An ACE inhibitor/angiotensin II receptor blocker is being taken. She does not see a podiatrist.Eye exam is current.  Hypertension This is a chronic problem. The current episode started more than 1 year ago. The problem has been resolved since onset. The problem is controlled. Pertinent negatives include no sweats. Agents associated with hypertension include thyroid hormones. Risk factors for coronary artery disease include sedentary lifestyle, obesity, post-menopausal state, dyslipidemia, diabetes mellitus and family history. Past treatments include angiotensin blockers and diuretics. The current treatment provides moderate improvement. Compliance problems include diet and exercise.  Hypertensive end-organ damage includes kidney disease, CAD/MI and CVA. Identifiable causes of hypertension include a thyroid problem.     Review of systems  Constitutional: + steadily increasing body weight, current Body mass index is 55 kg/m., + fatigue, no subjective hyperthermia, no subjective hypothermia Eyes: no blurry vision, no xerophthalmia ENT: no sore throat, no nodules palpated in throat, no dysphagia/odynophagia, no hoarseness Cardiovascular: no chest pain, no shortness of breath, no palpitations, + leg swelling Respiratory: no cough, + shortness of breath with exertion Gastrointestinal: no nausea/vomiting/diarrhea Musculoskeletal: no muscle/joint aches, right side weakness from previous CVA, complains of right leg pain Skin: no rashes, no hyperemia Neurological: no tremors, + numbness to left arm and left leg, no dizziness Psychiatric: + significant depression, no anxiety  Objective:     Ht 5' (1.524 m)   Wt 281 lb 9.6 oz  (127.7 kg)   BMI 55.00 kg/m   Wt Readings from Last 3 Encounters:  02/02/22 281 lb 9.6 oz (127.7 kg)  01/19/22 279 lb 1.6 oz (126.6 kg)  01/13/22 277 lb (125.6 kg)     BP Readings from Last 3 Encounters:  01/19/22 139/68  01/13/22 128/70  11/22/21 (!) 169/70     Physical  Exam- Limited  Constitutional:  Body mass index is 55 kg/m. , not in acute distress, significantly depressed state of mind (see PHQ9 score below), tearful during visit Eyes:  EOMI, no exophthalmos Neck: Supple Cardiovascular: RRR, no murmurs, rubs, or gallops, 1+ pitting edema to BLE Respiratory: Adequate breathing efforts, no crackles, rales, rhonchi, or wheezing Musculoskeletal: minimal right sided weakness- residual from previous stroke Skin:  no rashes, no hyperemia Neurological: no tremor with outstretched hands  [place] Office Visit from 02/02/2022 in [facility]  PHQ-9 Total Score 15        CMP ( most recent) CMP     Component Value Date/Time   NA 140 11/04/2021 1119   K 5.1 11/04/2021 1119   CL 105 11/04/2021 1119   CO2 19 (L) 11/04/2021 1119   GLUCOSE 203 (H) 11/04/2021 1119   GLUCOSE 172 (H) 09/15/2020 1037   BUN 30 (H) 11/04/2021 1119   CREATININE 1.66 (H) 11/04/2021 1119   CALCIUM 9.5 11/04/2021 1119   PROT 6.8 11/04/2021 1119   ALBUMIN 4.2 11/04/2021 1119   AST 19 11/04/2021 1119   ALT 17 11/04/2021 1119   ALKPHOS 147 (H) 11/04/2021 1119   BILITOT 0.2 11/04/2021 1119   GFRNONAA 32 (L) 09/15/2020 1037   GFRAA 48 (L) 07/09/2017 1112     Diabetic Labs (most recent): Lab Results  Component Value Date   HGBA1C 9.1 (A) 02/02/2022   HGBA1C 10.5 11/02/2021   HGBA1C 9.1 07/11/2021   MICROALBUR 150 07/11/2021     Lipid Panel ( most recent) Lipid Panel     Component Value Date/Time   CHOL 126 11/04/2021 1119   TRIG 134 11/04/2021 1119   HDL 43 11/04/2021 1119   CHOLHDL 2.9 11/04/2021 1119   LDLCALC 59 11/04/2021 1119   LABVLDL 24 11/04/2021 1119       Lab Results  Component Value Date   TSH 5.750 (H) 11/04/2021   TSH 1.533 09/29/2020   FREET4 1.06 11/04/2021           Assessment & Plan:   1) Type 2 diabetes mellitus with stage 3b chronic kidney disease, with long-term current use of insulin ([place])  She presents today with her CGM showing above target glycemic profile overall.  Her POCT A1c today is 9.1%, improving from last visit of 10.5%.  Analysis of her CGM shows TIR 15%, TAR 85%, TBR 0% with a GMI of 8.6%.  She notes she hardly eats and is frustrated that her weight continues to rise.  She appears significantly depressed, and her husband agrees.  - Jessica Strong has currently uncontrolled symptomatic type 2 DM since 76 years of age.   -Recent labs reviewed.  - I had a long discussion with her about the progressive nature of diabetes and the pathology behind its complications. -her diabetes is complicated by CVA, MI, CAD, OSA, CKD and she remains at a high risk for more acute and chronic complications which include CAD, CVA, CKD, retinopathy, and neuropathy. These are all discussed in detail with her.  The following Lifestyle Medicine recommendations according to [facility]) were discussed and offered to patient and she agrees to start the journey:  A. Whole Foods, Plant-based plate comprising of fruits and vegetables, plant-based proteins, whole-grain carbohydrates was discussed in detail with the patient.   A list for source of those nutrients were also provided to the patient.  Patient will use only water or unsweetened tea for hydration. B.  The  need to stay away from risky substances including alcohol, smoking; obtaining 7 to 9 hours of restorative sleep, at least 150 minutes of moderate intensity exercise weekly, the importance of healthy social connections,  and stress reduction techniques were discussed. C.  A full color page of  Calorie density of various food groups per pound  showing examples of each food groups was provided to the patient.  - Nutritional counseling repeated at each appointment due to patients tendency to fall back in to old habits.  - The patient admits there is a room for improvement in their diet and drink choices. -  Suggestion is made for the patient to avoid simple carbohydrates from their diet including Cakes, Sweet Desserts / Pastries, Ice Cream, Soda (diet and regular), Sweet Tea, Candies, Chips, Cookies, Sweet Pastries, Store Bought Juices, Alcohol in Excess of 1-2 drinks a day, Artificial Sweeteners, Coffee Creamer, and "Sugar-free" Products. This will help patient to have stable blood glucose profile and potentially avoid unintended weight gain.   - I encouraged the patient to switch to unprocessed or minimally processed complex starch and increased protein intake (animal or plant source), fruits, and vegetables.   - Patient is advised to stick to a routine mealtimes to eat 3 meals a day and avoid unnecessary snacks (to snack only to correct hypoglycemia).  - she will be scheduled with Jearld Fenton, RDN, CDE for diabetes education.  - I have approached her with the following individualized plan to manage her diabetes and patient agrees:   -Will increase her Mounjaro to 7.5 mg SQ weekly x 1 month then increase to 10 mg SQ weekly (can use 2 of her 5 mg injections on same day until she depletes her current supply).  She is advised to continue her Tresiba 55 units SQ nightly, Humalog 10-16 units TID with meals if glucose is above 90 and she is eating (Specific instructions on how to titrate insulin dosage based on glucose readings given to patient in writing).   -she is encouraged to continue monitoring glucose 4 times daily (using her CGM), before meals and before bed, and to call the clinic if she has readings less than 70 or above 300 for 3 tests in a row.    - she is warned not to take insulin without proper monitoring per orders. -  Adjustment parameters are given to her for hypo and hyperglycemia in writing.  - her Metformin was discontinued, risk outweighs benefit for this patient given recent renal decline.  - Specific targets for  A1c; LDL, HDL, and Triglycerides were discussed with the patient.  2) Blood Pressure /Hypertension:  her blood pressure is controlled to target for her age.   she is advised to continue her current medications including Benicar HCT 20-12.5 mg p.o. daily with breakfast.  3) Lipids/Hyperlipidemia:    There is no recent lipid panel to review nor does she appear to be on any lipid lowering agents.   4)  Weight/Diet:  her Body mass index is 55 kg/m.  -  clearly complicating her diabetes care.   she is a candidate for weight loss. I discussed with her the fact that loss of 5 - 10% of her  current body weight will have the most impact on her diabetes management.  Exercise, and detailed carbohydrates information provided  -  detailed on discharge instructions.  5) Chronic Care/Health Maintenance: -she is on ACEI/ARB and not on Statin medications and is encouraged to initiate and continue to follow up with  Ophthalmology, Dentist, Podiatrist at least yearly or according to recommendations, and advised to stay away from smoking. I have recommended yearly flu vaccine and pneumonia vaccine at least every 5 years; moderate intensity exercise for up to 150 minutes weekly; and sleep for at least 7 hours a day.  - she is advised to maintain close follow up with Alvira Monday, FNP for primary care needs, as well as her other providers for optimal and coordinated care.   -I did encourage patient to speak with her PCP regarding her depression (see PHQ9 score above).  I believe her depression is really impacting her chronic health conditions.    I spent 40 minutes in the care of the patient today including review of labs from [facility], Lipids, Thyroid Function, Hematology (current and previous including  abstractions from other facilities); face-to-face time discussing  her blood glucose readings/logs, discussing hypoglycemia and hyperglycemia episodes and symptoms, medications doses, her options of short and long term treatment based on the latest standards of care / guidelines;  discussion about incorporating lifestyle medicine;  and documenting the encounter. Risk reduction counseling performed per USPSTF guidelines to reduce obesity and cardiovascular risk factors.     Please refer to Patient Instructions for Blood Glucose Monitoring and Insulin/Medications Dosing Guide"  in media tab for additional information. Please  also refer to " Patient Self Inventory" in the Media  tab for reviewed elements of pertinent patient history.  Jessica Strong participated in the discussions, expressed understanding, and voiced agreement with the above plans.  All questions were answered to her satisfaction. she is encouraged to contact clinic should she have any questions or concerns prior to her return visit.     Follow up plan: - Return in about 3 months (around 05/05/2022) for Diabetes F/U with A1c in office, No previsit labs, Bring meter and logs.    Rayetta Pigg, [facility] [address] [place], [place] [address] Phone: [phone] Fax: [phone]  02/02/2022, 3:52 PM

## 2022-02-10 ENCOUNTER — Other Ambulatory Visit: Payer: Self-pay | Admitting: Nurse Practitioner

## 2022-02-21 ENCOUNTER — Ambulatory Visit (INDEPENDENT_AMBULATORY_CARE_PROVIDER_SITE_OTHER): Payer: Medicare Other | Admitting: Family Medicine

## 2022-02-21 ENCOUNTER — Other Ambulatory Visit: Payer: Self-pay | Admitting: Nurse Practitioner

## 2022-02-21 ENCOUNTER — Encounter: Payer: Self-pay | Admitting: Family Medicine

## 2022-02-21 VITALS — BP 136/82 | HR 96 | Ht 60.0 in | Wt 281.1 lb

## 2022-02-21 DIAGNOSIS — Z2821 Immunization not carried out because of patient refusal: Secondary | ICD-10-CM | POA: Diagnosis not present

## 2022-02-21 DIAGNOSIS — Z1159 Encounter for screening for other viral diseases: Secondary | ICD-10-CM

## 2022-02-21 DIAGNOSIS — Z23 Encounter for immunization: Secondary | ICD-10-CM

## 2022-02-21 DIAGNOSIS — E559 Vitamin D deficiency, unspecified: Secondary | ICD-10-CM | POA: Diagnosis not present

## 2022-02-21 DIAGNOSIS — E7849 Other hyperlipidemia: Secondary | ICD-10-CM

## 2022-02-21 DIAGNOSIS — E1101 Type 2 diabetes mellitus with hyperosmolarity with coma: Secondary | ICD-10-CM | POA: Diagnosis not present

## 2022-02-21 DIAGNOSIS — E038 Other specified hypothyroidism: Secondary | ICD-10-CM

## 2022-02-21 DIAGNOSIS — R609 Edema, unspecified: Secondary | ICD-10-CM

## 2022-02-21 DIAGNOSIS — E876 Hypokalemia: Secondary | ICD-10-CM

## 2022-02-21 DIAGNOSIS — I10 Essential (primary) hypertension: Secondary | ICD-10-CM

## 2022-02-21 DIAGNOSIS — Z794 Long term (current) use of insulin: Secondary | ICD-10-CM

## 2022-02-21 DIAGNOSIS — E785 Hyperlipidemia, unspecified: Secondary | ICD-10-CM | POA: Insufficient documentation

## 2022-02-21 DIAGNOSIS — Z1383 Encounter for screening for respiratory disorder NEC: Secondary | ICD-10-CM

## 2022-02-21 MED ORDER — NOVOFINE PEN NEEDLE 32G X 6 MM MISC: 1 refills | Status: DC

## 2022-02-21 MED ORDER — POTASSIUM CHLORIDE CRYS ER 10 MEQ PO TBCR: 10.0000 meq | EXTENDED_RELEASE_TABLET | Freq: Every day | ORAL | 1 refills | Status: DC

## 2022-02-21 NOTE — Assessment & Plan Note (Signed)
Controlled She takes Lasix 20 mg daily, olmesartan 20 mg daily and potassium chloride 10 mEg daily Refill potassium chloride 10 mEg daily No changes to treatment regimen today BP Readings from Last 3 Encounters:  02/21/22 136/82  01/19/22 139/68  01/13/22 128/70

## 2022-02-21 NOTE — Patient Instructions (Addendum)
I appreciate the opportunity to provide care to you today!    Follow up:  3 months  Labs: please stop by the lab  durito get your blood drawn (CBC, CMP, Lipid profile, Vit D)  Please stop by the lab to check your thyroid levels in December.  Your potassium has been refilled, and the prescription will be delivered to your house.  Please do not take any ibuprofen products due to your kidney function.  Take Tylenol for pain relief of joints in your hands and feet.  Be mindful of your sodium intake, and elevate your legs to decrease swelling of your ankles\  Continue taking Lasix 20 mg daily to reduce lower extremities edema.   Please continue to a heart-healthy diet and increase your physical activities. Try to exercise for 78mns at least three times a week.      It was a pleasure to see you and I look forward to continuing to work together on your health and well-being. Please do not hesitate to call the office if you need care or have questions about your care.   Have a wonderful day and week. With Gratitude, GAlvira MondayMSN, FNP-BC

## 2022-02-21 NOTE — Assessment & Plan Note (Signed)
No swelling noted today on her ankles and lower extremities Encourage patient to be mindful of her sodium intake and to elevate her legs when swelling is noted Encouraged to continue taking Lasix 20 mg daily Pain in her left fingers and these are likely due to arthritis Encourage patient to take Tylenol, and abstain from NSAIDs products to prevent further injury to her kidneys

## 2022-02-21 NOTE — Assessment & Plan Note (Signed)
She takes Synthroid 75 mg daily  Her dose was changed from 50 mg to 75 mg on 02/02/2022, but she reports starting the new treatment regimen on medication on 02/17/2022 Encouraged patient to return for labs on March 31, 2022 to assess her thyroid levels Lab Results  Component Value Date   TSH 5.750 (H) 11/04/2021

## 2022-02-21 NOTE — Progress Notes (Signed)
Established Patient Office Visit  Subjective:  Patient ID: Jessica Strong, female    DOB: [birth year]  Age: 76 y.o. MRN: [id number]  CC:  Chief Complaint  Patient presents with   Follow-up    Pt reports pain in arms and legs having difficulty walking. Onset of this began 01/22/2022 getting worse.     HPI Jessica Strong is a 76 y.o. female with past medical history of essential hypertension, hypothyroidism, type 2 diabetes presents and peripheral edema for f/u of  chronic medical conditions.  Hypertension: She takes Lasix 20 mg daily, olmesartan 20 mg daily and potassium chloride 10 mEg daily.  She denies headaches, dizziness, blurred vision, and chest pain.  Hypothyroidism: She takes Synthroid 75 mg daily.  Her dose was changed from 50 mg to 75 mg on 02/02/2022, but she reports starting the new treatment regimen on medication on 02/17/2022.  She denies muscle spasm and muscle aches  Hyperlipidemia: She takes simvastatin 20 mg daily at bedtime.  She denies myopathy  She complains of pain in her left fingers, and knees.  She notes morning stiffness of her joints and denies numbness and tingling.  Type 2 diabetes: She is on Mounjaro 7.5 mg subcu weekly, Tresiba 55 units SQ nightly, Humalog 10-16 units TID with meals if glucose is above 90 and she is eating.  She checks her blood sugar 4 times daily before meals and before bedtime and is advised to call if her blood sugars less than 70 or above 300  for 3 tests in the row.  She denies polyuria, polyphagia, and polydipsia.      Past Medical History:  Diagnosis Date   Allergy    Anemia    Anxiety    Arthritis    Cataract    cervical ca 1990   Depression    Diabetes ([place])    Hypercholesterolemia    Hypertension    Myocardial infarction ([place])    Sleep apnea    on CPAP 14 mm PS since February 2017   Stroke [facility])    weakness on R side   Vitamin D deficiency     Past Surgical History:  Procedure Laterality Date   ABDOMINAL  HYSTERECTOMY     BARIATRIC SURGERY     BIOPSY  09/17/2020   Procedure: BIOPSY;  Surgeon: Harvel Quale, MD;  [facility];  Service: Gastroenterology;;   CARDIAC CATHETERIZATION N/A 12/24/2015   Procedure: Right/Left Heart Cath and Coronary Angiography;  Surgeon: Jettie Booze, MD;  Location: [place] [facility];  Service: Cardiovascular;  Laterality: N/A;   COLONOSCOPY  2018   COLONOSCOPY WITH PROPOFOL N/A 09/17/2020   Procedure: COLONOSCOPY WITH PROPOFOL;  Surgeon: Harvel Quale, MD;  [facility];  Service: Gastroenterology;  Laterality: N/A;  9:00   ESOPHAGOGASTRODUODENOSCOPY (EGD) WITH PROPOFOL N/A 09/17/2020   Procedure: ESOPHAGOGASTRODUODENOSCOPY (EGD) WITH PROPOFOL;  Surgeon: Harvel Quale, MD;  [facility];  Service: Gastroenterology;  Laterality: N/A;   HERNIA REPAIR     LEFT HEART CATH AND CORONARY ANGIOGRAPHY N/A 07/12/2017   Procedure: LEFT HEART CATH AND CORONARY ANGIOGRAPHY;  Surgeon: Belva Crome, MD;  Location: [place] [facility];  Service: Cardiovascular;  Laterality: N/A;    Family History  Problem Relation Age of Onset   Cancer Mother    Colon cancer Mother    Diabetes Mother    Hypertension Father    Thyroid disease Father    Thyroid cancer Father  Diabetes Father    Esophageal cancer Father    Breast cancer Maternal Aunt    Colon cancer Maternal Aunt    Breast cancer Maternal Aunt    Colon cancer Maternal Grandmother    Stroke Maternal Grandfather    Stomach cancer Paternal Grandfather    Colon cancer Son    Colon polyps Neg Hx    Rectal cancer Neg Hx     Social History   Socioeconomic History   Marital status: Married    Spouse name: Not on file   Number of children: 2   Years of education: Not on file   Highest education level: Not on file  Occupational History   Occupation: retired  Tobacco Use   Smoking status: Never    Passive exposure: Never   Smokeless tobacco:  Never  Vaping Use   Vaping Use: Never used  Substance and Sexual Activity   Alcohol use: Not Currently    Comment: occasionally   Drug use: No   Sexual activity: Never    Partners: Male  Other Topics Concern   Not on file  Social History Narrative   Not on file   Social Determinants of Health   Financial Resource Strain: Not on file  Food Insecurity: Not on file  Transportation Needs: Not on file  Physical Activity: Not on file  Stress: Not on file  Social Connections: Not on file  Intimate Partner Violence: Not on file    Outpatient Medications Prior to Visit  Medication Sig Dispense Refill   ACCU-CHEK AVIVA PLUS test strip Inject 1 strip as directed daily.  4   acetaminophen (TYLENOL) 500 MG tablet Take 500 mg by mouth every 6 (six) hours as needed for mild pain or headache. As needed.     aspirin 325 MG tablet Take 325 mg by mouth daily.     Calcium Carb-Cholecalciferol (CALCIUM 600-D PO) Take 1 tablet by mouth daily.     EPINEPHrine 0.3 mg/0.3 mL IJ SOAJ injection Inject 0.3 mg into the muscle as needed for anaphylaxis.     ferrous sulfate 325 (65 FE) MG tablet Take 325 mg by mouth daily with breakfast.     furosemide (LASIX) 20 MG tablet Take 1 tablet (20 mg total) by mouth daily. (May take extra 27m as needed for severe swelling.) 01/13/2022 135 tablet 1   HUMALOG KWIKPEN 100 UNIT/ML KwikPen Inject 5-11 Units into the skin 3 (three) times daily with meals.     Lancets Thin MISC Inject 1 Stick into the skin daily.     levothyroxine (SYNTHROID) 75 MCG tablet Take 1 tablet (75 mcg total) by mouth daily before breakfast. 90 tablet 1   lubiprostone (AMITIZA) 8 MCG capsule Take 1 capsule (8 mcg total) by mouth 2 (two) times daily with a meal. 60 capsule 1   Multiple Vitamins-Minerals (MULTIVITAMIN ADULT) CHEW Chew 1 tablet by mouth daily at 12 noon.     olmesartan (BENICAR) 20 MG tablet TAKE 1 TABLET BY MOUTH DAILY 60 tablet 5   simvastatin (ZOCOR) 20 MG tablet Take 20 mg by  mouth at bedtime.     terbinafine (LAMISIL AT) 1 % cream Apply 1 Application topically 2 (two) times daily. 30 g 0   tolterodine (DETROL LA) 4 MG 24 hr capsule Take 1 capsule (4 mg total) by mouth daily. 30 capsule 11   TRESIBA FLEXTOUCH 200 UNIT/ML FlexTouch Pen Inject 56 Units into the skin at bedtime. 30 mL 1   NOVOFINE PEN NEEDLE 32G  X 6 MM MISC Inject into the skin.     potassium chloride (KLOR-CON M) 10 MEQ tablet Take 10 mEq by mouth daily.     tirzepatide (MOUNJARO) 7.5 MG/0.5ML Pen Inject 7.5 mg into the skin once a week. 2 mL 0   No facility-administered medications prior to visit.    Allergies  Allergen Reactions   Corn Oil Rash   Other Rash    PT STATES SHE IS ALLERGIC TO MOST FOODS-STATES IT WAS FOUND IN ALLERGY TESTING   Peanut Allergen Powder-Dnfp Rash   Peanut-Containing Drug Products Rash   Penicillins Rash    Has patient had a PCN reaction causing immediate rash, facial/tongue/throat swelling, SOB or lightheadedness with hypotension: Yes Has patient had a PCN reaction causing severe rash involving mucus membranes or skin necrosis: No Has patient had a PCN reaction that required hospitalization No Has patient had a PCN reaction occurring within the last 10 years: No. Has not had in 40-50 years If all of the above answers are "NO", then may proceed with Cephalosporin use.    ROS Review of Systems  Constitutional:  Negative for fatigue and fever.  Eyes:  Negative for visual disturbance.  Respiratory:  Negative for chest tightness and shortness of breath.   Cardiovascular:  Negative for chest pain and palpitations.  Musculoskeletal:  Positive for arthralgias.  Neurological:  Negative for dizziness and headaches.      Objective:    Physical Exam HENT:     Head: Normocephalic.  Cardiovascular:     Rate and Rhythm: Normal rate and regular rhythm.     Pulses: Normal pulses.     Heart sounds: Normal heart sounds.  Pulmonary:     Effort: Pulmonary effort is  normal.     Breath sounds: Normal breath sounds.  Musculoskeletal:     Left hand: No swelling, lacerations or tenderness. Normal range of motion.  Neurological:     Mental Status: She is alert.     BP 136/82   Pulse 96   Ht 5' (1.524 m)   Wt 281 lb 1.3 oz (127.5 kg)   SpO2 92%   BMI 54.89 kg/m  Wt Readings from Last 3 Encounters:  02/21/22 281 lb 1.3 oz (127.5 kg)  02/02/22 281 lb 9.6 oz (127.7 kg)  01/19/22 279 lb 1.6 oz (126.6 kg)    Lab Results  Component Value Date   TSH 5.750 (H) 11/04/2021   Lab Results  Component Value Date   WBC 7.1 11/04/2021   HGB 11.0 (L) 11/04/2021   HCT 35.3 11/04/2021   MCV 82 11/04/2021   PLT 304 11/04/2021   Lab Results  Component Value Date   NA 140 11/04/2021   K 5.1 11/04/2021   CO2 19 (L) 11/04/2021   GLUCOSE 203 (H) 11/04/2021   BUN 30 (H) 11/04/2021   CREATININE 1.66 (H) 11/04/2021   BILITOT 0.2 11/04/2021   ALKPHOS 147 (H) 11/04/2021   AST 19 11/04/2021   ALT 17 11/04/2021   PROT 6.8 11/04/2021   ALBUMIN 4.2 11/04/2021   CALCIUM 9.5 11/04/2021   ANIONGAP 10 09/15/2020   EGFR 32 (L) 11/04/2021   Lab Results  Component Value Date   CHOL 126 11/04/2021   Lab Results  Component Value Date   HDL 43 11/04/2021   Lab Results  Component Value Date   LDLCALC 59 11/04/2021   Lab Results  Component Value Date   TRIG 134 11/04/2021   Lab Results  Component Value Date  CHOLHDL 2.9 11/04/2021   Lab Results  Component Value Date   HGBA1C 9.1 (A) 02/02/2022      Assessment & Plan:   Problem List Items Addressed This Visit       Cardiovascular and Mediastinum   Essential hypertension - Primary    Controlled She takes Lasix 20 mg daily, olmesartan 20 mg daily and potassium chloride 10 mEg daily Refill potassium chloride 10 mEg daily No changes to treatment regimen today BP Readings from Last 3 Encounters:  02/21/22 136/82  01/19/22 139/68  01/13/22 128/70          Endocrine   DM2 (diabetes  mellitus, type 2) ([place])    She is on Mounjaro 7.5 mg subcu weekly, Tresiba 55 units SQ nightly, Humalog 10-16 units TID with meals if glucose is above 90 and she is eating She checks her blood sugar 4 times daily before meals and before bedtime and is advised to call if her blood sugars less than 70 or above 300 for 3 tests in the row She denies polyuria, polyphagia, and polydipsia She follows up with endocrinology for insulin management Lab Results  Component Value Date   HGBA1C 9.1 (A) 02/02/2022       Relevant Medications   NOVOFINE PEN NEEDLE 32G X 6 MM MISC   Other Relevant Orders   CBC with Differential/Platelet   CMP14+EGFR   Hypothyroidism    She takes Synthroid 75 mg daily  Her dose was changed from 50 mg to 75 mg on 02/02/2022, but she reports starting the new treatment regimen on medication on 02/17/2022 Encouraged patient to return for labs on March 31, 2022 to assess her thyroid levels Lab Results  Component Value Date   TSH 5.750 (H) 11/04/2021       Relevant Orders   TSH + free T4     Other   Peripheral edema    No swelling noted today on her ankles and lower extremities Encourage patient to be mindful of her sodium intake and to elevate her legs when swelling is noted Encouraged to continue taking Lasix 20 mg daily Pain in her left fingers and these are likely due to arthritis Encourage patient to take Tylenol, and abstain from NSAIDs products to prevent further injury to her kidneys       Hyperlipidemia    Encouraged to continue taking simvastatin 20 mg daily We will check lipid panel today Lab Results  Component Value Date   CHOL 126 11/04/2021   HDL 43 11/04/2021   LDLCALC 59 11/04/2021   TRIG 134 11/04/2021   CHOLHDL 2.9 11/04/2021        Relevant Orders   Lipid Profile   Other Visit Diagnoses     Refused influenza vaccine       Vitamin D deficiency       Relevant Orders   Vitamin D (25 hydroxy)   Encounter for screening for pneumonia        Need for Streptococcus pneumoniae vaccination       Relevant Orders   Pneumococcal conjugate vaccine 20-valent (Completed)   Potassium deficiency       Relevant Medications   potassium chloride (KLOR-CON M) 10 MEQ tablet   Need for hepatitis C screening test       Relevant Orders   Hepatitis C Antibody       Meds ordered this encounter  Medications   potassium chloride (KLOR-CON M) 10 MEQ tablet    Sig: Take 1 tablet (10 mEq  total) by mouth daily.    Dispense:  30 tablet    Refill:  1   NOVOFINE PEN NEEDLE 32G X 6 MM MISC    Sig: Use 1 needle 3 times daily    Dispense:  100 each    Refill:  1    Follow-up: Return in about 3 months (around 05/24/2022).    Alvira Monday, FNP

## 2022-02-21 NOTE — Assessment & Plan Note (Signed)
Encouraged to continue taking simvastatin 20 mg daily We will check lipid panel today Lab Results  Component Value Date   CHOL 126 11/04/2021   HDL 43 11/04/2021   LDLCALC 59 11/04/2021   TRIG 134 11/04/2021   CHOLHDL 2.9 11/04/2021

## 2022-02-21 NOTE — Assessment & Plan Note (Signed)
She is on Mounjaro 7.5 mg subcu weekly, Tresiba 55 units SQ nightly, Humalog 10-16 units TID with meals if glucose is above 90 and she is eating She checks her blood sugar 4 times daily before meals and before bedtime and is advised to call if her blood sugars less than 70 or above 300 for 3 tests in the row She denies polyuria, polyphagia, and polydipsia She follows up with endocrinology for insulin management Lab Results  Component Value Date   HGBA1C 9.1 (A) 02/02/2022

## 2022-03-06 ENCOUNTER — Ambulatory Visit: Payer: Medicare Other | Admitting: Urology

## 2022-03-06 ENCOUNTER — Encounter: Payer: Self-pay | Admitting: Urology

## 2022-03-06 VITALS — BP 158/92 | HR 89

## 2022-03-06 DIAGNOSIS — N3281 Overactive bladder: Secondary | ICD-10-CM

## 2022-03-06 LAB — POCT URINALYSIS DIPSTICK
Bilirubin, UA: NEGATIVE
Blood, UA: NEGATIVE
Glucose, UA: NEGATIVE
Ketones, UA: NEGATIVE
Leukocytes, UA: NEGATIVE
Nitrite, UA: NEGATIVE
Protein, UA: NEGATIVE
Spec Grav, UA: 1.02 (ref 1.010–1.025)
Urobilinogen, UA: 0.2 E.U./dL
pH, UA: 5.5 (ref 5.0–8.0)

## 2022-03-06 LAB — BLADDER SCAN AMB NON-IMAGING: Scan Result: 1

## 2022-03-06 MED ORDER — GEMTESA 75 MG PO TABS: 75.0000 mg | ORAL_TABLET | Freq: Every day | ORAL | 11 refills | Status: DC

## 2022-03-06 MED ORDER — TOLTERODINE TARTRATE ER 4 MG PO CP24: 4.0000 mg | ORAL_CAPSULE | Freq: Every day | ORAL | 11 refills | Status: DC

## 2022-03-06 NOTE — Progress Notes (Signed)
03/06/2022 2:24 PM   [place] Oct 10, 1945 [id number]  Referring provider: Alvira Monday, [address] [place],  [place] [address]  No chief complaint on file.   HPI:  F/u -    1) urinary frequency-she has frequency and urgency. She has incontinence. She wears pads and goes through several per day and night. She s/p caldera sling on 01/05/2018 with Dr. Carlota Raspberry. No change in incontinence. Maybe worse. She leaks with urgency. Not so much with stress. No gross hematuria. She tried Myrbetriq. 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 She does have constipation.  CT abdomen and pelvis March 2023 benign GU tract.  Punctate left renal stone.   PVR was 5 ml. Cystoscopy benign Jul 2023.She tried [place] which worked well for a week but then was less than effective. Standing triggers. She c/o urgency and leakage.    Today, seen for the above. She underwent Jul 2023 UDS - pvr 50, C 100 ml, sens 55, desire 86. Unstable with severe leakage. No SUI. Normal voiding. She tried tolterodine which seemed to work for a few weeks and then stopped.   PMH: Past Medical History:  Diagnosis Date   Allergy    Anemia    Anxiety    Arthritis    Cataract    cervical ca 1990   Depression    Diabetes ([place])    Hypercholesterolemia    Hypertension    Myocardial infarction ([place])    Sleep apnea    on CPAP 14 mm PS since February 2017   Stroke [facility])    weakness on R side   Vitamin D deficiency     Surgical History: Past Surgical History:  Procedure Laterality Date   ABDOMINAL HYSTERECTOMY     BARIATRIC SURGERY     BIOPSY  09/17/2020   Procedure: BIOPSY;  Surgeon: Harvel Quale, MD;  [facility];  Service: Gastroenterology;;   CARDIAC CATHETERIZATION N/A 12/24/2015   Procedure: Right/Left Heart Cath and Coronary Angiography;  Surgeon: Jettie Booze, MD;  Location: [place] [facility];  Service: Cardiovascular;  Laterality: N/A;   COLONOSCOPY  2018   COLONOSCOPY WITH PROPOFOL  N/A 09/17/2020   Procedure: COLONOSCOPY WITH PROPOFOL;  Surgeon: Harvel Quale, MD;  [facility];  Service: Gastroenterology;  Laterality: N/A;  9:00   ESOPHAGOGASTRODUODENOSCOPY (EGD) WITH PROPOFOL N/A 09/17/2020   Procedure: ESOPHAGOGASTRODUODENOSCOPY (EGD) WITH PROPOFOL;  Surgeon: Harvel Quale, MD;  [facility];  Service: Gastroenterology;  Laterality: N/A;   HERNIA REPAIR     LEFT HEART CATH AND CORONARY ANGIOGRAPHY N/A 07/12/2017   Procedure: LEFT HEART CATH AND CORONARY ANGIOGRAPHY;  Surgeon: Belva Crome, MD;  Location: [place] [facility];  Service: Cardiovascular;  Laterality: N/A;    Home Medications:  Allergies as of 03/06/2022       Reactions   Corn Oil Rash   Other Rash   PT STATES SHE IS ALLERGIC TO MOST FOODS-STATES IT WAS FOUND IN ALLERGY TESTING   Peanut Allergen Powder-dnfp Rash   Peanut-containing Drug Products Rash   Penicillins Rash   Has patient had a PCN reaction causing immediate rash, facial/tongue/throat swelling, SOB or lightheadedness with hypotension: Yes Has patient had a PCN reaction causing severe rash involving mucus membranes or skin necrosis: No Has patient had a PCN reaction that required hospitalization No Has patient had a PCN reaction occurring within the last 10 years: No. Has not had in 40-50 years If all  of the above answers are "NO", then may proceed with Cephalosporin use.        Medication List        Accurate as of March 06, 2022  2:24 PM. If you have any questions, ask your nurse or doctor.          Accu-Chek Aviva Plus test strip Generic drug: glucose blood Inject 1 strip as directed daily.   acetaminophen 500 MG tablet Commonly known as: TYLENOL Take 500 mg by mouth every 6 (six) hours as needed for mild pain or headache. As needed.   aspirin 325 MG tablet Take 325 mg by mouth daily.   CALCIUM 600-D PO Take 1 tablet by mouth daily.   EPINEPHrine 0.3 mg/0.3 mL Soaj  injection Commonly known as: EPI-PEN Inject 0.3 mg into the muscle as needed for anaphylaxis.   ferrous sulfate 325 (65 FE) MG tablet Take 325 mg by mouth daily with breakfast.   furosemide 20 MG tablet Commonly known as: LASIX Take 1 tablet (20 mg total) by mouth daily. (May take extra '20mg'$  as needed for severe swelling.) 01/13/2022   HumaLOG KwikPen 100 UNIT/ML KwikPen Generic drug: insulin lispro Inject 5-11 Units into the skin 3 (three) times daily with meals.   Lancets Thin Misc Inject 1 Stick into the skin daily.   levothyroxine 75 MCG tablet Commonly known as: SYNTHROID Take 1 tablet (75 mcg total) by mouth daily before breakfast.   lubiprostone 8 MCG capsule Commonly known as: Amitiza Take 1 capsule (8 mcg total) by mouth 2 (two) times daily with a meal.   Multivitamin Adult Chew Chew 1 tablet by mouth daily at 12 noon.   Novofine Pen Needle 32G X 6 MM Misc Generic drug: Insulin Pen Needle Use 1 needle 3 times daily   olmesartan 20 MG tablet Commonly known as: BENICAR TAKE 1 TABLET BY MOUTH DAILY   potassium chloride 10 MEQ tablet Commonly known as: KLOR-CON M Take 1 tablet (10 mEq total) by mouth daily.   simvastatin 20 MG tablet Commonly known as: ZOCOR Take 20 mg by mouth at bedtime.   terbinafine 1 % cream Commonly known as: LamISIL AT Apply 1 Application topically 2 (two) times daily.   tirzepatide 10 MG/0.5ML Pen Commonly known as: MOUNJARO Inject 10 mg into the skin once a week.   tolterodine 4 MG 24 hr capsule Commonly known as: DETROL LA Take 1 capsule (4 mg total) by mouth daily.   Tyler Aas FlexTouch 200 UNIT/ML FlexTouch Pen Generic drug: insulin degludec Inject 56 Units into the skin at bedtime.        Allergies:  Allergies  Allergen Reactions   Corn Oil Rash   Other Rash    PT STATES SHE IS ALLERGIC TO MOST FOODS-STATES IT WAS FOUND IN ALLERGY TESTING   Peanut Allergen Powder-Dnfp Rash   Peanut-Containing Drug Products Rash    Penicillins Rash    Has patient had a PCN reaction causing immediate rash, facial/tongue/throat swelling, SOB or lightheadedness with hypotension: Yes Has patient had a PCN reaction causing severe rash involving mucus membranes or skin necrosis: No Has patient had a PCN reaction that required hospitalization No Has patient had a PCN reaction occurring within the last 10 years: No. Has not had in 40-50 years If all of the above answers are "NO", then may proceed with Cephalosporin use.    Family History: Family History  Problem Relation Age of Onset   Cancer Mother    Colon cancer Mother  Diabetes Mother    Hypertension Father    Thyroid disease Father    Thyroid cancer Father    Diabetes Father    Esophageal cancer Father    Breast cancer Maternal Aunt    Colon cancer Maternal Aunt    Breast cancer Maternal Aunt    Colon cancer Maternal Grandmother    Stroke Maternal Grandfather    Stomach cancer Paternal Grandfather    Colon cancer Son    Colon polyps Neg Hx    Rectal cancer Neg Hx     Social History:  reports that she has never smoked. She has never been exposed to tobacco smoke. She has never used smokeless tobacco. She reports that she does not currently use alcohol. She reports that she does not use drugs.   Physical Exam: BP (!) 158/92   Pulse 89   Constitutional:  Alert and oriented, No acute distress. HEENT: [place] AT, moist mucus membranes.  Trachea midline, no masses. Cardiovascular: No clubbing, cyanosis, or edema. Respiratory: Normal respiratory effort, no increased work of breathing. GI: Abdomen is soft, nontender, nondistended, no abdominal masses GU: No CVA tenderness Skin: No rashes, bruises or suspicious lesions. Neurologic: Grossly intact, no focal deficits, moving all 4 extremities. Psychiatric: Normal mood and affect.  Laboratory Data: Lab Results  Component Value Date   WBC 7.1 11/04/2021   HGB 11.0 (L) 11/04/2021   HCT 35.3 11/04/2021   MCV 82  11/04/2021   PLT 304 11/04/2021    Lab Results  Component Value Date   CREATININE 1.66 (H) 11/04/2021    No results found for: "PSA"  No results found for: "TESTOSTERONE"  Lab Results  Component Value Date   HGBA1C 9.1 (A) 02/02/2022    Urinalysis    Component Value Date/Time   APPEARANCEUR Clear 11/14/2021 1517   GLUCOSEU Negative 11/14/2021 1517   BILIRUBINUR neg 03/06/2022 1416   BILIRUBINUR Negative 11/14/2021 1517   PROTEINUR Negative 03/06/2022 1416   PROTEINUR Negative 11/14/2021 1517   UROBILINOGEN 0.2 03/06/2022 1416   NITRITE neg 03/06/2022 1416   NITRITE Negative 11/14/2021 1517   LEUKOCYTESUR Negative 03/06/2022 1416   LEUKOCYTESUR Negative 11/14/2021 1517    Lab Results  Component Value Date   LABMICR Comment 11/14/2021   WBCUA 0-5 10/03/2021   LABEPIT 0-10 10/03/2021   BACTERIA Few 10/03/2021    Pertinent Imaging: UDS cystogram and tracing   Assessment & Plan:    1. Overactive bladder Disc UDS. Disc nature r/b/ of combo anticholinergic and beta 3, botox or ptns. She will try combo. - BLADDER SCAN AMB NON-IMAGING - POCT urinalysis dipstick   No follow-ups on file.  Festus Aloe, MD  [facility]  [address] [place], [place] [address] [phone]

## 2022-03-06 NOTE — Progress Notes (Signed)
post void residual=1 

## 2022-03-08 LAB — CMP14+EGFR
ALT: 16 IU/L (ref 0–32)
AST: 22 IU/L (ref 0–40)
Albumin/Globulin Ratio: 1.4 (ref 1.2–2.2)
Albumin: 4 g/dL (ref 3.8–4.8)
Alkaline Phosphatase: 121 IU/L (ref 44–121)
BUN/Creatinine Ratio: 18 (ref 12–28)
BUN: 31 mg/dL — ABNORMAL HIGH (ref 8–27)
Bilirubin Total: 0.2 mg/dL (ref 0.0–1.2)
CO2: 19 mmol/L — ABNORMAL LOW (ref 20–29)
Calcium: 9.4 mg/dL (ref 8.7–10.3)
Chloride: 104 mmol/L (ref 96–106)
Creatinine, Ser: 1.76 mg/dL — ABNORMAL HIGH (ref 0.57–1.00)
Globulin, Total: 2.8 g/dL (ref 1.5–4.5)
Glucose: 187 mg/dL — ABNORMAL HIGH (ref 70–99)
Potassium: 5.5 mmol/L — ABNORMAL HIGH (ref 3.5–5.2)
Sodium: 142 mmol/L (ref 134–144)
Total Protein: 6.8 g/dL (ref 6.0–8.5)
eGFR: 30 mL/min/{1.73_m2} — ABNORMAL LOW (ref >59)

## 2022-03-08 LAB — CBC WITH DIFFERENTIAL/PLATELET
Basophils Absolute: 0.1 10*3/uL (ref 0.0–0.2)
Basos: 1 %
EOS (ABSOLUTE): 0.3 10*3/uL (ref 0.0–0.4)
Eos: 3 %
Hematocrit: 35.8 % (ref 34.0–46.6)
Hemoglobin: 10.9 g/dL — ABNORMAL LOW (ref 11.1–15.9)
Immature Grans (Abs): 0.1 10*3/uL (ref 0.0–0.1)
Immature Granulocytes: 1 %
Lymphocytes Absolute: 2.3 10*3/uL (ref 0.7–3.1)
Lymphs: 21 %
MCH: 25.5 pg — ABNORMAL LOW (ref 26.6–33.0)
MCHC: 30.4 g/dL — ABNORMAL LOW (ref 31.5–35.7)
MCV: 84 fL (ref 79–97)
Monocytes Absolute: 0.8 10*3/uL (ref 0.1–0.9)
Monocytes: 8 %
Neutrophils Absolute: 7.2 10*3/uL — ABNORMAL HIGH (ref 1.4–7.0)
Neutrophils: 66 %
Platelets: 326 10*3/uL (ref 150–450)
RBC: 4.28 x10E6/uL (ref 3.77–5.28)
RDW: 14.1 % (ref 11.7–15.4)
WBC: 10.8 10*3/uL (ref 3.4–10.8)

## 2022-03-08 LAB — LIPID PANEL
Chol/HDL Ratio: 2.6 ratio (ref 0.0–4.4)
Cholesterol, Total: 113 mg/dL (ref 100–199)
HDL: 43 mg/dL (ref >39)
LDL Chol Calc (NIH): 45 mg/dL (ref 0–99)
Triglycerides: 146 mg/dL (ref 0–149)
VLDL Cholesterol Cal: 25 mg/dL (ref 5–40)

## 2022-03-08 LAB — VITAMIN D 25 HYDROXY (VIT D DEFICIENCY, FRACTURES): Vit D, 25-Hydroxy: 68.9 ng/mL (ref 30.0–100.0)

## 2022-03-09 ENCOUNTER — Other Ambulatory Visit: Payer: Self-pay | Admitting: Family Medicine

## 2022-03-09 DIAGNOSIS — E1322 Other specified diabetes mellitus with diabetic chronic kidney disease: Secondary | ICD-10-CM

## 2022-03-09 NOTE — Progress Notes (Signed)
Please encourage the patient to take a potassium supplement every other day, as her potassium level is elevated.  I recommend that she stay well-hydrated by drinking at least 64 ounces of water daily. A referral was placed to nephrology due to a decline in her kidney function.

## 2022-03-10 ENCOUNTER — Encounter: Payer: Self-pay | Admitting: Family Medicine

## 2022-03-30 ENCOUNTER — Other Ambulatory Visit: Payer: Self-pay | Admitting: Family Medicine

## 2022-03-30 DIAGNOSIS — E1101 Type 2 diabetes mellitus with hyperosmolarity with coma: Secondary | ICD-10-CM

## 2022-03-30 DIAGNOSIS — E876 Hypokalemia: Secondary | ICD-10-CM

## 2022-04-02 ENCOUNTER — Other Ambulatory Visit: Payer: Self-pay | Admitting: Cardiology

## 2022-04-11 ENCOUNTER — Telehealth: Payer: Self-pay | Admitting: *Deleted

## 2022-04-11 NOTE — Telephone Encounter (Signed)
Talked with the patient. She states that she is will be out of her Mounjaro in a few weeks. She has been taking it the way she was instructed to at the time of her office visit 02/01/2022. Noted here.Will increase her Mounjaro to 7.5 mg SQ weekly x 1 month then increase to 10 mg SQ weekly (can use 2 of her 5 mg injections on same day until she depletes her current supply).     She tells me she doubled it as she was told that she could do and that she had also been on the Mounjaro 7.5 sq for the 6 weeks. She is ready to go up to the 10 mg weekly and would like a prescription sent in to [facility] for this. It looks like this was done on 02/21/2022. Patient says she doesn't have the 10 mg?

## 2022-04-12 ENCOUNTER — Other Ambulatory Visit: Payer: Self-pay

## 2022-04-12 MED ORDER — TIRZEPATIDE 10 MG/0.5ML ~~LOC~~ SOAJ: 10.0000 mg | SUBCUTANEOUS | 1 refills | Status: DC

## 2022-04-12 MED ORDER — TIRZEPATIDE 10 MG/0.5ML ~~LOC~~ SOAJ: 10.0000 mg | SUBCUTANEOUS | 3 refills | Status: DC

## 2022-04-12 NOTE — Telephone Encounter (Signed)
Patient was called and made aware. 

## 2022-04-12 NOTE — Telephone Encounter (Signed)
I just resent the script for the 10 mg doses to [facility] for her.

## 2022-04-17 ENCOUNTER — Ambulatory Visit: Payer: Medicare Other | Admitting: Urology

## 2022-04-17 ENCOUNTER — Encounter (INDEPENDENT_AMBULATORY_CARE_PROVIDER_SITE_OTHER): Payer: Self-pay | Admitting: Gastroenterology

## 2022-04-17 ENCOUNTER — Ambulatory Visit (INDEPENDENT_AMBULATORY_CARE_PROVIDER_SITE_OTHER): Payer: Medicare Other | Admitting: Gastroenterology

## 2022-04-17 ENCOUNTER — Encounter: Payer: Self-pay | Admitting: Urology

## 2022-04-17 VITALS — BP 154/74 | HR 85 | Temp 97.6°F | Ht 60.0 in | Wt 272.9 lb

## 2022-04-17 VITALS — BP 187/79 | HR 97 | Ht 60.0 in | Wt 281.0 lb

## 2022-04-17 DIAGNOSIS — K5900 Constipation, unspecified: Secondary | ICD-10-CM

## 2022-04-17 DIAGNOSIS — N3281 Overactive bladder: Secondary | ICD-10-CM | POA: Diagnosis not present

## 2022-04-17 DIAGNOSIS — R1012 Left upper quadrant pain: Secondary | ICD-10-CM

## 2022-04-17 LAB — URINALYSIS, ROUTINE W REFLEX MICROSCOPIC
Bilirubin, UA: NEGATIVE
Glucose, UA: NEGATIVE
Ketones, UA: NEGATIVE
Leukocytes,UA: NEGATIVE
Nitrite, UA: NEGATIVE
Protein,UA: NEGATIVE
RBC, UA: NEGATIVE
Specific Gravity, UA: 1.02 (ref 1.005–1.030)
Urobilinogen, Ur: 0.2 mg/dL (ref 0.2–1.0)
pH, UA: 5 (ref 5.0–7.5)

## 2022-04-17 LAB — BLADDER SCAN AMB NON-IMAGING: Scan Result: 0

## 2022-04-17 NOTE — Patient Instructions (Signed)
Increase water intake, aim for atleast 64 oz per day Increase fruits, veggies and whole grains, kiwi and prunes are especially good for constipation You can continue dulcolax if you feel this is working for you, if you decide you would like to try the amitiza, please let me know  If left abdominal pain becomes worse, you have it more frequently or have associated symptoms, please let me know  Follow up 6 months

## 2022-04-17 NOTE — Progress Notes (Unsigned)
04/17/2022 2:06 PM   [place] January 30, 1946 [id number]  Referring provider: Alvira Monday, [address] [place],  [place] [address]  No chief complaint on file.   HPI:  F/u -    1) urinary frequency-she has frequency and urgency. She has incontinence. She wears pads and goes through several per day and night. She s/p caldera sling on 01/05/2018 with Dr. Carlota Raspberry. No change in incontinence. Maybe worse. She leaks with urgency. Not so much with stress. No gross hematuria. She tried Myrbetriq. 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 She does have constipation.  CT abdomen and pelvis March 2023 benign GU tract.  Punctate left renal stone.    PVR was 5 ml. Cystoscopy benign Jul 2023.She tried [place] which worked well for a week but then was less than effective. Standing triggers. She c/o urgency and leakage.    Today, seen for the above. She underwent Jul 2023 UDS - pvr 50, C 100 ml, sens 55, desire 86. Unstable with severe leakage. No SUI. Normal voiding. She tried tolterodine which seemed to work for a few weeks and then stopped.   She returns and started combination therapy. Noc improved but still has continual leakage without awareness in the day. When she needs to go to the bathroom she is wet.    PMH: Past Medical History:  Diagnosis Date   Allergy    Anemia    Anxiety    Arthritis    Cataract    cervical ca 1990   Depression    Diabetes ([place])    Hypercholesterolemia    Hypertension    Myocardial infarction ([place])    Sleep apnea    on CPAP 14 mm PS since February 2017   Stroke [facility])    weakness on R side   Vitamin D deficiency     Surgical History: Past Surgical History:  Procedure Laterality Date   ABDOMINAL HYSTERECTOMY     BARIATRIC SURGERY     BIOPSY  09/17/2020   Procedure: BIOPSY;  Surgeon: Harvel Quale, MD;  [facility];  Service: Gastroenterology;;   CARDIAC CATHETERIZATION N/A 12/24/2015   Procedure: Right/Left Heart Cath and  Coronary Angiography;  Surgeon: Jettie Booze, MD;  Location: [place] [facility];  Service: Cardiovascular;  Laterality: N/A;   COLONOSCOPY  2018   COLONOSCOPY WITH PROPOFOL N/A 09/17/2020   Procedure: COLONOSCOPY WITH PROPOFOL;  Surgeon: Harvel Quale, MD;  [facility];  Service: Gastroenterology;  Laterality: N/A;  9:00   ESOPHAGOGASTRODUODENOSCOPY (EGD) WITH PROPOFOL N/A 09/17/2020   Procedure: ESOPHAGOGASTRODUODENOSCOPY (EGD) WITH PROPOFOL;  Surgeon: Harvel Quale, MD;  [facility];  Service: Gastroenterology;  Laterality: N/A;   HERNIA REPAIR     LEFT HEART CATH AND CORONARY ANGIOGRAPHY N/A 07/12/2017   Procedure: LEFT HEART CATH AND CORONARY ANGIOGRAPHY;  Surgeon: Belva Crome, MD;  Location: [place] [facility];  Service: Cardiovascular;  Laterality: N/A;    Home Medications:  Allergies as of 04/17/2022       Reactions   Corn Oil Rash   Other Rash   PT STATES SHE IS ALLERGIC TO MOST FOODS-STATES IT WAS FOUND IN ALLERGY TESTING   Peanut Allergen Powder-dnfp Rash   Peanut-containing Drug Products Rash   Penicillins Rash   Has patient had a PCN reaction causing immediate rash, facial/tongue/throat swelling, SOB or lightheadedness with hypotension: Yes Has patient had a PCN reaction causing severe rash involving mucus membranes or skin  necrosis: No Has patient had a PCN reaction that required hospitalization No Has patient had a PCN reaction occurring within the last 10 years: No. Has not had in 40-50 years If all of the above answers are "NO", then may proceed with Cephalosporin use.        Medication List        Accurate as of April 17, 2022  2:06 PM. If you have any questions, ask your nurse or doctor.          Accu-Chek Aviva Plus test strip Generic drug: glucose blood Inject 1 strip as directed daily.   acetaminophen 500 MG tablet Commonly known as: TYLENOL Take 500 mg by mouth every 6 (six) hours as needed  for mild pain or headache. As needed.   aspirin 325 MG tablet Take 325 mg by mouth daily.   CALCIUM 600-D PO Take 1 tablet by mouth daily.   EPINEPHrine 0.3 mg/0.3 mL Soaj injection Commonly known as: EPI-PEN Inject 0.3 mg into the muscle as needed for anaphylaxis.   ferrous sulfate 325 (65 FE) MG tablet Take 325 mg by mouth daily with breakfast.   furosemide 20 MG tablet Commonly known as: LASIX TAKE 1 TABLET (20 MG TOTAL) BY  MOUTH DAILY. (MAY TAKE EXTRA 1  TABLET AS NEEDED FOR SEVERE  SWELLING.)   Gemtesa 75 MG Tabs Generic drug: Vibegron Take 75 mg by mouth daily.   HumaLOG KwikPen 100 UNIT/ML KwikPen Generic drug: insulin lispro Inject 5-11 Units into the skin 3 (three) times daily with meals.   Lancets Thin Misc Inject 1 Stick into the skin daily.   levothyroxine 75 MCG tablet Commonly known as: SYNTHROID Take 1 tablet (75 mcg total) by mouth daily before breakfast.   lubiprostone 8 MCG capsule Commonly known as: Amitiza Take 1 capsule (8 mcg total) by mouth 2 (two) times daily with a meal.   Multivitamin Adult Chew Chew 1 tablet by mouth daily at 12 noon.   Novofine Pen Needle 32G X 6 MM Misc Generic drug: Insulin Pen Needle USE 1 NEEDLE 3 TIMES DAILY   olmesartan 20 MG tablet Commonly known as: BENICAR TAKE 1 TABLET BY MOUTH DAILY   potassium chloride 10 MEQ tablet Commonly known as: KLOR-CON M TAKE 1 TABLET BY MOUTH DAILY   simvastatin 20 MG tablet Commonly known as: ZOCOR Take 20 mg by mouth at bedtime.   terbinafine 1 % cream Commonly known as: LamISIL AT Apply 1 Application topically 2 (two) times daily.   tirzepatide 10 MG/0.5ML Pen Commonly known as: MOUNJARO Inject 10 mg into the skin once a week.   tolterodine 4 MG 24 hr capsule Commonly known as: DETROL LA Take 1 capsule (4 mg total) by mouth daily.   Tyler Aas FlexTouch 200 UNIT/ML FlexTouch Pen Generic drug: insulin degludec Inject 56 Units into the skin at bedtime.         Allergies:  Allergies  Allergen Reactions   Corn Oil Rash   Other Rash    PT STATES SHE IS ALLERGIC TO MOST FOODS-STATES IT WAS FOUND IN ALLERGY TESTING   Peanut Allergen Powder-Dnfp Rash   Peanut-Containing Drug Products Rash   Penicillins Rash    Has patient had a PCN reaction causing immediate rash, facial/tongue/throat swelling, SOB or lightheadedness with hypotension: Yes Has patient had a PCN reaction causing severe rash involving mucus membranes or skin necrosis: No Has patient had a PCN reaction that required hospitalization No Has patient had a PCN reaction occurring within the  last 10 years: No. Has not had in 40-50 years If all of the above answers are "NO", then may proceed with Cephalosporin use.    Family History: Family History  Problem Relation Age of Onset   Cancer Mother    Colon cancer Mother    Diabetes Mother    Hypertension Father    Thyroid disease Father    Thyroid cancer Father    Diabetes Father    Esophageal cancer Father    Breast cancer Maternal Aunt    Colon cancer Maternal Aunt    Breast cancer Maternal Aunt    Colon cancer Maternal Grandmother    Stroke Maternal Grandfather    Stomach cancer Paternal Grandfather    Colon cancer Son    Colon polyps Neg Hx    Rectal cancer Neg Hx     Social History:  reports that she has never smoked. She has never been exposed to tobacco smoke. She has never used smokeless tobacco. She reports that she does not currently use alcohol. She reports that she does not use drugs.   Physical Exam: BP (!) 187/79   Pulse 97   Ht 5' (1.524 m)   Wt 281 lb (127.5 kg)   BMI 54.88 kg/m   Constitutional:  Alert and oriented, No acute distress. HEENT: [place] AT, moist mucus membranes.  Trachea midline, no masses. Cardiovascular: No clubbing, cyanosis, or edema. Respiratory: Normal respiratory effort, no increased work of breathing. GI: Abdomen is soft, nontender, nondistended, no abdominal masses GU: No CVA  tenderness Skin: No rashes, bruises or suspicious lesions. Neurologic: Grossly intact, no focal deficits, moving all 4 extremities. Psychiatric: Normal mood and affect.  Laboratory Data: Lab Results  Component Value Date   WBC 10.8 03/06/2022   HGB 10.9 (L) 03/06/2022   HCT 35.8 03/06/2022   MCV 84 03/06/2022   PLT 326 03/06/2022    Lab Results  Component Value Date   CREATININE 1.76 (H) 03/06/2022    No results found for: "PSA"  No results found for: "TESTOSTERONE"  Lab Results  Component Value Date   HGBA1C 9.1 (A) 02/02/2022    Urinalysis    Component Value Date/Time   APPEARANCEUR Clear 11/14/2021 1517   GLUCOSEU Negative 11/14/2021 1517   BILIRUBINUR neg 03/06/2022 1416   BILIRUBINUR Negative 11/14/2021 1517   PROTEINUR Negative 03/06/2022 1416   PROTEINUR Negative 11/14/2021 1517   UROBILINOGEN 0.2 03/06/2022 1416   NITRITE neg 03/06/2022 1416   NITRITE Negative 11/14/2021 1517   LEUKOCYTESUR Negative 03/06/2022 1416   LEUKOCYTESUR Negative 11/14/2021 1517    Lab Results  Component Value Date   LABMICR Comment 11/14/2021   WBCUA 0-5 10/03/2021   LABEPIT 0-10 10/03/2021   BACTERIA Few 10/03/2021    Pertinent Imaging: N/a   Assessment & Plan:    1. Overactive bladder She is on max medical therapy. Disc trial of botox or PTNS. She will proceed with botox - disc risk of UTI, hematuria and retention among others.   - BLADDER SCAN AMB NON-IMAGING - Urinalysis, Routine w reflex microscopic   No follow-ups on file.  Festus Aloe, MD  [facility]  [address] [place], [place] [address] [phone]

## 2022-04-17 NOTE — Progress Notes (Unsigned)
post void residual =0mL 

## 2022-04-17 NOTE — Progress Notes (Unsigned)
Referring Provider: Alvira Monday, FNP Primary Care Physician:  Alvira Monday, FNP Primary GI Physician: Jenetta Downer   Chief Complaint  Patient presents with   Constipation    3 month follow up on constipation. Takes one dulcolax every other day. Has stool every day.    HPI:   Jessica Strong is a 76 y.o. female with past medical history of obesity status post Roux-en-Y gastric bypass, anxiety, depression, diabetes, hyperlipidemia, hypertension, myocardial infarction, stroke   Patient presenting today for follow up of constipation.  Last seen September, at that time Recent labs with TSH 5.75 in July 2023, PCP is monitoring TSH and trying to adjust her medications for more optimal results.    taking dulcolax every 2-3 days, thinks she did try miralax in the past but is unsure if this worked. She is having a BM only when she takes the dulcolax. Stools are hard initially then easier to pass thereafter, she reports that she has up to 3 larger BMs when she does take the dulcolax. She denies abdominal pain but feels that her abdomen is enlarged. She continues to have SOB despite previous unremarkable workup. Denies blood in stool or melena. Water intake is very good with around 90oz per day. She is trying to get into a weight loss program to help with her weight as she has gained 40 pounds.   Recommend starting amitiza 3mg BID, continue good water intake, discuss weight loss options with PCP.  Present: She states she is taking dulcolax every other day and is having a BM daily. Sometimes has to strain some to go. She denies rectal bleeding or melena. She did not get amitiza prescribed last time as the pharmacy closed. She does not want to try anything else for the constipation. She is drinking quite a bit of water per day. no nausea or vomiting currently, may have a rare episode of n/v once every 6 months that will resolve thereafter. Feels that BMs are a sufficient volume when she does go to  the restroom. She has some mid/left upper quadrant pain that occurs maybe once per week, no precipitating factors, can be moving around or sitting. Pain will last a few minutes then resolve on its own. Does not feel that eating affects this. No other associated symptoms. She had blood work with PCP this morning, Thyroid meds were increased per patient.   She reports she does not really have an appetite, is maybe eating some less, feels somewhat full all the time. Weight is down to 272 lbs today from 281 in October.   CT A/P w/o contrast: July 21, 2021 Hepatic steatosis.  Small nonobstructive left renal calculus.  Sigmoid diverticulosis without inflammation. EGD:09/17/20- Normal esophagus. - Gastric mucosal atrophy. Biopsied-H pylori positive (neg stool testing in aug 2022) - Normal examined duodenum. Biopsied-normal Last Colonoscopy: 09/17/20- A single non-bleeding colonic angiodysplastic lesion. - One 2 mm polyp in the ascending colon-tubular adenoma - Diverticulosis in the sigmoid colon and in the descending colon. - Non-bleeding internal hemorrhoids.  REPEAT COLONOSCOPY IN 5 YEARS  Past Medical History:  Diagnosis Date   Allergy    Anemia    Anxiety    Arthritis    Cataract    cervical ca 1990   Depression    Diabetes ([place]    Hypercholesterolemia    Hypertension    Myocardial infarction ([facility]    Sleep apnea    on CPAP 14 mm PS since February 2017   Stroke ([facility]  weakness on R side   Vitamin D deficiency     Past Surgical History:  Procedure Laterality Date   ABDOMINAL HYSTERECTOMY     BARIATRIC SURGERY     BIOPSY  09/17/2020   Procedure: BIOPSY;  Surgeon: Harvel Quale, MD;  [facility];  Service: Gastroenterology;;   CARDIAC CATHETERIZATION N/A 12/24/2015   Procedure: Right/Left Heart Cath and Coronary Angiography;  Surgeon: Jettie Booze, MD;  Location: [place] [facility];  Service: Cardiovascular;  Laterality: N/A;   COLONOSCOPY  2018    COLONOSCOPY WITH PROPOFOL N/A 09/17/2020   Procedure: COLONOSCOPY WITH PROPOFOL;  Surgeon: Harvel Quale, MD;  [facility];  Service: Gastroenterology;  Laterality: N/A;  9:00   ESOPHAGOGASTRODUODENOSCOPY (EGD) WITH PROPOFOL N/A 09/17/2020   Procedure: ESOPHAGOGASTRODUODENOSCOPY (EGD) WITH PROPOFOL;  Surgeon: Harvel Quale, MD;  [facility];  Service: Gastroenterology;  Laterality: N/A;   HERNIA REPAIR     LEFT HEART CATH AND CORONARY ANGIOGRAPHY N/A 07/12/2017   Procedure: LEFT HEART CATH AND CORONARY ANGIOGRAPHY;  Surgeon: Belva Crome, MD;  Location: [place] [facility];  Service: Cardiovascular;  Laterality: N/A;    Current Outpatient Medications  Medication Sig Dispense Refill   ACCU-CHEK AVIVA PLUS test strip Inject 1 strip as directed daily.  4   acetaminophen (TYLENOL) 500 MG tablet Take 500 mg by mouth every 6 (six) hours as needed for mild pain or headache. As needed.     aspirin 325 MG tablet Take 325 mg by mouth daily.     bisacodyl (DULCOLAX) 5 MG EC tablet Take 5 mg by mouth. One every other day     Calcium Carb-Cholecalciferol (CALCIUM 600-D PO) Take 1 tablet by mouth daily.     EPINEPHrine 0.3 mg/0.3 mL IJ SOAJ injection Inject 0.3 mg into the muscle as needed for anaphylaxis.     ferrous sulfate 325 (65 FE) MG tablet Take 325 mg by mouth daily with breakfast.     furosemide (LASIX) 20 MG tablet TAKE 1 TABLET (20 MG TOTAL) BY  MOUTH DAILY. (MAY TAKE EXTRA 1  TABLET AS NEEDED FOR SEVERE  SWELLING.) 150 tablet 2   HUMALOG KWIKPEN 100 UNIT/ML KwikPen Inject 5-11 Units into the skin 3 (three) times daily with meals.     Lancets Thin MISC Inject 1 Stick into the skin daily.     levothyroxine (SYNTHROID) 75 MCG tablet Take 1 tablet (75 mcg total) by mouth daily before breakfast. 90 tablet 1   Multiple Vitamins-Minerals (MULTIVITAMIN ADULT) CHEW Chew 1 tablet by mouth daily at 12 noon.     NOVOFINE PEN NEEDLE 32G X 6 MM MISC USE 1  NEEDLE 3 TIMES DAILY 200 each 2   olmesartan (BENICAR) 20 MG tablet TAKE 1 TABLET BY MOUTH DAILY 60 tablet 5   potassium chloride (KLOR-CON M) 10 MEQ tablet TAKE 1 TABLET BY MOUTH DAILY 60 tablet 5   simvastatin (ZOCOR) 20 MG tablet Take 20 mg by mouth at bedtime.     terbinafine (LAMISIL AT) 1 % cream Apply 1 Application topically 2 (two) times daily. 30 g 0   tirzepatide (MOUNJARO) 10 MG/0.5ML Pen Inject 10 mg into the skin once a week. 6 mL 1   tolterodine (DETROL LA) 4 MG 24 hr capsule Take 1 capsule (4 mg total) by mouth daily. 30 capsule 11   TRESIBA FLEXTOUCH 200 UNIT/ML FlexTouch Pen Inject 56 Units into the skin at bedtime. 30 mL 1   Vibegron (  GEMTESA) 75 MG TABS Take 75 mg by mouth daily. 30 tablet 11   lubiprostone (AMITIZA) 8 MCG capsule Take 1 capsule (8 mcg total) by mouth 2 (two) times daily with a meal. (Patient not taking: Reported on 04/17/2022) 60 capsule 1   No current facility-administered medications for this visit.    Allergies as of 04/17/2022 - Review Complete 04/17/2022  Allergen Reaction Noted   Corn oil Rash 12/26/2017   Other Rash 12/22/2015   Peanut allergen powder-dnfp Rash 12/26/2017   Peanut-containing drug products Rash 12/22/2015   Penicillins Rash     Family History  Problem Relation Age of Onset   Cancer Mother    Colon cancer Mother    Diabetes Mother    Hypertension Father    Thyroid disease Father    Thyroid cancer Father    Diabetes Father    Esophageal cancer Father    Breast cancer Maternal Aunt    Colon cancer Maternal Aunt    Breast cancer Maternal Aunt    Colon cancer Maternal Grandmother    Stroke Maternal Grandfather    Stomach cancer Paternal Grandfather    Colon cancer Son    Colon polyps Neg Hx    Rectal cancer Neg Hx     Social History   Socioeconomic History   Marital status: Married    Spouse name: Not on file   Number of children: 2   Years of education: Not on file   Highest education level: Not on file   Occupational History   Occupation: retired  Tobacco Use   Smoking status: Never    Passive exposure: Never   Smokeless tobacco: Never  Vaping Use   Vaping Use: Never used  Substance and Sexual Activity   Alcohol use: Not Currently    Comment: occasionally   Drug use: No   Sexual activity: Never    Partners: Male  Other Topics Concern   Not on file  Social History Narrative   Not on file   Social Determinants of Health   Financial Resource Strain: Not on file  Food Insecurity: Not on file  Transportation Needs: Not on file  Physical Activity: Not on file  Stress: Not on file  Social Connections: Not on file   Review of systems General: negative for malaise, night sweats, fever, chills, weight loss Neck: Negative for lumps, goiter, pain and significant neck swelling Resp: Negative for cough, wheezing, dyspnea at rest CV: Negative for chest pain, leg swelling, palpitations, orthopnea GI: denies melena, hematochezia, nausea, vomiting, diarrhea, dysphagia, odyonophagia, early satiety or unintentional weight loss. +constipation +LUQ pain MSK: Negative for joint pain or swelling, back pain, and muscle pain. Derm: Negative for itching or rash Psych: Denies depression, anxiety, memory loss, confusion. No homicidal or suicidal ideation.  Heme: Negative for prolonged bleeding, bruising easily, and swollen nodes. Endocrine: Negative for cold or heat intolerance, polyuria, polydipsia and goiter. Neuro: negative for tremor, gait imbalance, syncope and seizures. The remainder of the review of systems is noncontributory.  Physical Exam: BP (!) 154/74 (BP Location: Left Arm, Patient Position: Sitting, Cuff Size: Large)   Pulse 85   Temp 97.6 F (36.4 C) (Oral)   Ht 5' (1.524 m)   Wt 272 lb 14.4 oz (123.8 kg)   BMI 53.30 kg/m  General:   Alert and oriented. No distress noted. Pleasant and cooperative. obese Head:  Normocephalic and atraumatic. Eyes:  Conjuctiva clear without  scleral icterus. Mouth:  Oral mucosa pink and moist. Good dentition.  No lesions. Heart: Normal rate and rhythm, s1 and s2 heart sounds present.  Lungs: Clear lung sounds in all lobes. Respirations equal and unlabored. Abdomen:  +BS, soft, non-tender and non-distended. No rebound or guarding. No HSM or masses noted. Derm: No palmar erythema or jaundice Msk:  Symmetrical without gross deformities. Normal posture. Extremities:  Without edema. Neurologic:  Alert and  oriented x4 Psych:  Alert and cooperative. Normal mood and affect.  Invalid input(s): "6 MONTHS"   ASSESSMENT: Jessica Strong is a 76 y.o. female presenting today for follow up of constipation.  Patient with ongoing constipation, she is taking Dulcolax every other day and feels that this is working well for her currently.  She was given Amitiza at her last visit however she never obtained the medication as she states the pharmacy close prior to her getting it.  She is not interested in trying anything else for constipation at this time.  She denies any rectal bleeding or melena.  She is having some intermittent left mid to upper quadrant pain, not associated with any other symptoms or precipitated by anything specific.  No current red flag symptoms.  Denies any GERD symptoms.  pain resolves on its own, abdominal exam today is unremarkable.  She had recent upper endoscopy May 2022 that showed some gastric mucosal atrophy and H. pylori which she was treated for with confirmation of eradication.  Abdominal pain could be functional in nature, we will keep an eye on this if she has worsening pain, pain becomes more frequent or is associated with any other symptoms she will let me know.    PLAN:  Continue dulcolax  2. Continue good water intake, diet high in fruits, veggies and whole grains  3. Pt to make me aware of left abd pain worsens or is associated with any new symptoms 4. Pt to make me aware if she wishes to try other therapy for  constipation.   All questions were answered, patient verbalized understanding and is in agreement with plan as outlined above.   Follow Up: 6 months   Adekunle Rohrbach L. Alver Sorrow, MSN, APRN, AGNP-C Adult-Gerontology Nurse Practitioner [facility]  I have reviewed the note and agree with the APP's assessment as described in this progress note  [name], MD Gastroenterology and Hepatology [facility]

## 2022-04-18 DIAGNOSIS — R1012 Left upper quadrant pain: Secondary | ICD-10-CM | POA: Insufficient documentation

## 2022-04-18 LAB — TSH: TSH: 3.88 u[IU]/mL (ref 0.450–4.500)

## 2022-04-18 LAB — T4, FREE: Free T4: 1.5 ng/dL (ref 0.82–1.77)

## 2022-05-08 ENCOUNTER — Ambulatory Visit: Payer: Medicare Other | Admitting: Nurse Practitioner

## 2022-05-08 ENCOUNTER — Encounter: Payer: Self-pay | Admitting: Nurse Practitioner

## 2022-05-08 VITALS — BP 145/75 | HR 77 | Ht 60.0 in | Wt 275.0 lb

## 2022-05-08 DIAGNOSIS — I10 Essential (primary) hypertension: Secondary | ICD-10-CM | POA: Diagnosis not present

## 2022-05-08 DIAGNOSIS — Z794 Long term (current) use of insulin: Secondary | ICD-10-CM

## 2022-05-08 DIAGNOSIS — E039 Hypothyroidism, unspecified: Secondary | ICD-10-CM | POA: Diagnosis not present

## 2022-05-08 DIAGNOSIS — N1832 Chronic kidney disease, stage 3b: Secondary | ICD-10-CM | POA: Diagnosis not present

## 2022-05-08 DIAGNOSIS — Z808 Family history of malignant neoplasm of other organs or systems: Secondary | ICD-10-CM

## 2022-05-08 DIAGNOSIS — E1122 Type 2 diabetes mellitus with diabetic chronic kidney disease: Secondary | ICD-10-CM | POA: Diagnosis not present

## 2022-05-08 DIAGNOSIS — E782 Mixed hyperlipidemia: Secondary | ICD-10-CM

## 2022-05-08 LAB — POCT GLYCOSYLATED HEMOGLOBIN (HGB A1C): Hemoglobin A1C: 8 % — AB (ref 4.0–5.6)

## 2022-05-08 MED ORDER — TRESIBA FLEXTOUCH 200 UNIT/ML ~~LOC~~ SOPN: 55.0000 [IU] | PEN_INJECTOR | Freq: Every day | SUBCUTANEOUS | 1 refills | Status: DC

## 2022-05-08 MED ORDER — TIRZEPATIDE 12.5 MG/0.5ML ~~LOC~~ SOAJ: 12.5000 mg | SUBCUTANEOUS | 1 refills | Status: DC

## 2022-05-08 MED ORDER — HUMALOG KWIKPEN 100 UNIT/ML ~~LOC~~ SOPN: 5.0000 [IU] | PEN_INJECTOR | Freq: Three times a day (TID) | SUBCUTANEOUS | 3 refills | Status: DC

## 2022-05-08 NOTE — Progress Notes (Signed)
Endocrinology Follow Up Note       05/08/2022, 3:28 PM   Subjective:    Patient ID: Jessica Strong, female    DOB: 03/20/1946.  Jessica Strong is being seen in follow up after being seen in consultation for management of currently uncontrolled symptomatic diabetes requested by  Alvira Monday, [place].   Past Medical History:  Diagnosis Date   Allergy    Anemia    Anxiety    Arthritis    Cataract    cervical ca 1990   Depression    Diabetes ([place])    Hypercholesterolemia    Hypertension    Myocardial infarction ([place])    Sleep apnea    on CPAP 14 mm PS since February 2017   Stroke [facility])    weakness on R side   Vitamin D deficiency     Past Surgical History:  Procedure Laterality Date   ABDOMINAL HYSTERECTOMY     BARIATRIC SURGERY     BIOPSY  09/17/2020   Procedure: BIOPSY;  Surgeon: Harvel Quale, MD;  [facility];  Service: Gastroenterology;;   CARDIAC CATHETERIZATION N/A 12/24/2015   Procedure: Right/Left Heart Cath and Coronary Angiography;  Surgeon: Jettie Booze, MD;  Location: [place] [facility];  Service: Cardiovascular;  Laterality: N/A;   COLONOSCOPY  2018   COLONOSCOPY WITH PROPOFOL N/A 09/17/2020   Procedure: COLONOSCOPY WITH PROPOFOL;  Surgeon: Harvel Quale, MD;  [facility];  Service: Gastroenterology;  Laterality: N/A;  9:00   ESOPHAGOGASTRODUODENOSCOPY (EGD) WITH PROPOFOL N/A 09/17/2020   Procedure: ESOPHAGOGASTRODUODENOSCOPY (EGD) WITH PROPOFOL;  Surgeon: Harvel Quale, MD;  [facility];  Service: Gastroenterology;  Laterality: N/A;   HERNIA REPAIR     LEFT HEART CATH AND CORONARY ANGIOGRAPHY N/A 07/12/2017   Procedure: LEFT HEART CATH AND CORONARY ANGIOGRAPHY;  Surgeon: Belva Crome, MD;  Location: [place] [facility];  Service: Cardiovascular;  Laterality: N/A;    Social History   Socioeconomic History    Marital status: Married    Spouse name: Not on file   Number of children: 2   Years of education: Not on file   Highest education level: Not on file  Occupational History   Occupation: retired  Tobacco Use   Smoking status: Never    Passive exposure: Never   Smokeless tobacco: Never  Vaping Use   Vaping Use: Never used  Substance and Sexual Activity   Alcohol use: Not Currently    Comment: occasionally   Drug use: No   Sexual activity: Never    Partners: Male  Other Topics Concern   Not on file  Social History Narrative   Not on file   Social Determinants of Health   Financial Resource Strain: Not on file  Food Insecurity: Not on file  Transportation Needs: Not on file  Physical Activity: Not on file  Stress: Not on file  Social Connections: Not on file    Family History  Problem Relation Age of Onset   Cancer Mother    Colon cancer Mother    Diabetes Mother    Hypertension Father    Thyroid disease Father  Thyroid cancer Father    Diabetes Father    Esophageal cancer Father    Breast cancer Maternal Aunt    Colon cancer Maternal Aunt    Breast cancer Maternal Aunt    Colon cancer Maternal Grandmother    Stroke Maternal Grandfather    Stomach cancer Paternal Grandfather    Colon cancer Son    Colon polyps Neg Hx    Rectal cancer Neg Hx     Outpatient Encounter Medications as of 05/08/2022  Medication Sig   ACCU-CHEK AVIVA PLUS test strip Inject 1 strip as directed daily.   acetaminophen (TYLENOL) 500 MG tablet Take 500 mg by mouth every 6 (six) hours as needed for mild pain or headache. As needed.   aspirin 325 MG tablet Take 325 mg by mouth daily.   bisacodyl (DULCOLAX) 5 MG EC tablet Take 5 mg by mouth. One every other day   Calcium Carb-Cholecalciferol (CALCIUM 600-D PO) Take 1 tablet by mouth daily.   EPINEPHrine 0.3 mg/0.3 mL IJ SOAJ injection Inject 0.3 mg into the muscle as needed for anaphylaxis.   ferrous sulfate 325 (65 FE) MG tablet Take 325  mg by mouth daily with breakfast.   furosemide (LASIX) 20 MG tablet TAKE 1 TABLET (20 MG TOTAL) BY  MOUTH DAILY. (MAY TAKE EXTRA 1  TABLET AS NEEDED FOR SEVERE  SWELLING.)   Lancets Thin MISC Inject 1 Stick into the skin daily.   levothyroxine (SYNTHROID) 75 MCG tablet Take 1 tablet (75 mcg total) by mouth daily before breakfast.   Multiple Vitamins-Minerals (MULTIVITAMIN ADULT) CHEW Chew 1 tablet by mouth daily at 12 noon.   NOVOFINE PEN NEEDLE 32G X 6 MM MISC USE 1 NEEDLE 3 TIMES DAILY   olmesartan (BENICAR) 20 MG tablet TAKE 1 TABLET BY MOUTH DAILY   potassium chloride (KLOR-CON M) 10 MEQ tablet TAKE 1 TABLET BY MOUTH DAILY   simvastatin (ZOCOR) 20 MG tablet Take 20 mg by mouth at bedtime.   terbinafine (LAMISIL AT) 1 % cream Apply 1 Application topically 2 (two) times daily.   tirzepatide (MOUNJARO) 12.5 MG/0.5ML Pen Inject 12.5 mg into the skin once a week.   tolterodine (DETROL LA) 4 MG 24 hr capsule Take 1 capsule (4 mg total) by mouth daily.   Vibegron (GEMTESA) 75 MG TABS Take 75 mg by mouth daily.   [DISCONTINUED] HUMALOG KWIKPEN 100 UNIT/ML KwikPen Inject 5-11 Units into the skin 3 (three) times daily with meals.   [DISCONTINUED] tirzepatide (MOUNJARO) 10 MG/0.5ML Pen Inject 10 mg into the skin once a week.   [DISCONTINUED] TRESIBA FLEXTOUCH 200 UNIT/ML FlexTouch Pen Inject 56 Units into the skin at bedtime.   HUMALOG KWIKPEN 100 UNIT/ML KwikPen Inject 5-11 Units into the skin 3 (three) times daily with meals.   lubiprostone (AMITIZA) 8 MCG capsule Take 1 capsule (8 mcg total) by mouth 2 (two) times daily with a meal. (Patient not taking: Reported on 04/17/2022)   TRESIBA FLEXTOUCH 200 UNIT/ML FlexTouch Pen Inject 56 Units into the skin at bedtime.   No facility-administered encounter medications on file as of 05/08/2022.    ALLERGIES: Allergies  Allergen Reactions   Corn Oil Rash   Other Rash    PT STATES SHE IS ALLERGIC TO MOST FOODS-STATES IT WAS FOUND IN ALLERGY TESTING    Peanut Allergen Powder-Dnfp Rash   Peanut-Containing Drug Products Rash   Penicillins Rash    Has patient had a PCN reaction causing immediate rash, facial/tongue/throat swelling, SOB or lightheadedness with hypotension:  Yes Has patient had a PCN reaction causing severe rash involving mucus membranes or skin necrosis: No Has patient had a PCN reaction that required hospitalization No Has patient had a PCN reaction occurring within the last 10 years: No. Has not had in 40-50 years If all of the above answers are "NO", then may proceed with Cephalosporin use.    VACCINATION STATUS: Immunization History  Administered Date(s) Administered   Moderna Sars-Covid-2 Vaccination 07/11/2019, 08/08/2019, 08/11/2020   PNEUMOCOCCAL CONJUGATE-20 02/21/2022    Diabetes She presents for her follow-up diabetic visit. She has type 2 diabetes mellitus. Onset time: Diagnosed at approx age of 35. Her disease course has been stable. There are no hypoglycemic associated symptoms. Pertinent negatives for hypoglycemia include no sweats or tremors. Associated symptoms include fatigue, foot paresthesias, polydipsia and polyuria. There are no hypoglycemic complications. Symptoms are improving. Diabetic complications include a CVA, heart disease, nephropathy and peripheral neuropathy. Risk factors for coronary artery disease include diabetes mellitus, dyslipidemia, family history, obesity, hypertension, post-menopausal and sedentary lifestyle. Current diabetic treatment includes intensive insulin program and oral agent (monotherapy). She is compliant with treatment most of the time. Her weight is decreasing steadily. She is following a generally healthy diet. When asked about meal planning, she reported none. She has not had a previous visit with a dietitian. She rarely participates in exercise. Her home blood glucose trend is fluctuating minimally. Her breakfast blood glucose range is generally 180-200 mg/dl. Her lunch blood  glucose range is generally 180-200 mg/dl. Her dinner blood glucose range is generally >200 mg/dl. Her bedtime blood glucose range is generally >200 mg/dl. Her overall blood glucose range is >200 mg/dl. (She presents today, accompanied by her husband with her CGM showing improved but still above target glycemic profile overall.  Her POCT A1c today is 8%, improving from last visit of 9.1%.  Analysis of her CGM shows TIR 25%, TAR 75%, TBR 0% with a GMI of 8.4%.  She notes she has no appetite but has tolerated the Mounjaro well.) An ACE inhibitor/angiotensin II receptor blocker is being taken. She does not see a podiatrist.Eye exam is current.  Hypertension This is a chronic problem. The current episode started more than 1 year ago. The problem has been resolved since onset. The problem is controlled. Pertinent negatives include no sweats. Agents associated with hypertension include thyroid hormones. Risk factors for coronary artery disease include sedentary lifestyle, obesity, post-menopausal state, dyslipidemia, diabetes mellitus and family history. Past treatments include angiotensin blockers and diuretics. The current treatment provides moderate improvement. Compliance problems include diet and exercise.  Hypertensive end-organ damage includes kidney disease, CAD/MI and CVA. Identifiable causes of hypertension include a thyroid problem.     Review of systems  Constitutional: + overall decreasing body weight, current Body mass index is 53.71 kg/m., + fatigue, no subjective hyperthermia, no subjective hypothermia Eyes: no blurry vision, no xerophthalmia ENT: no sore throat, no nodules palpated in throat, no dysphagia/odynophagia, no hoarseness Cardiovascular: no chest pain, no shortness of breath, no palpitations, + leg swelling Respiratory: no cough, + shortness of breath with exertion Gastrointestinal: no nausea/vomiting/diarrhea Musculoskeletal: no muscle/joint aches, right side weakness from  previous CVA, complains of right leg pain Skin: no rashes, no hyperemia Neurological: no tremors, + numbness to left arm and left leg, no dizziness Psychiatric: + significant depression, no anxiety  Objective:     BP (!) 145/75 (BP Location: Left Arm, Patient Position: Sitting, Cuff Size: Large)   Pulse 77   Ht 5' (1.524 m)  Wt 275 lb (124.7 kg)   BMI 53.71 kg/m   Wt Readings from Last 3 Encounters:  05/08/22 275 lb (124.7 kg)  04/17/22 272 lb 14.4 oz (123.8 kg)  04/17/22 281 lb (127.5 kg)     BP Readings from Last 3 Encounters:  05/08/22 (!) 145/75  04/17/22 (!) 154/74  04/17/22 (!) 187/79     Physical Exam- Limited  Constitutional:  Body mass index is 53.71 kg/m. , not in acute distress, normal state of mind Eyes:  EOMI, no exophthalmos Musculoskeletal: minimal right sided weakness- residual from previous stroke Skin:  no rashes, no hyperemia Neurological: no tremor with outstretched hands  [place] Office Visit from 02/21/2022 in [facility]  PHQ-9 Total Score 12        CMP ( most recent) CMP     Component Value Date/Time   NA 142 03/06/2022 1136   K 5.5 (H) 03/06/2022 1136   CL 104 03/06/2022 1136   CO2 19 (L) 03/06/2022 1136   GLUCOSE 187 (H) 03/06/2022 1136   GLUCOSE 172 (H) 09/15/2020 1037   BUN 31 (H) 03/06/2022 1136   CREATININE 1.76 (H) 03/06/2022 1136   CALCIUM 9.4 03/06/2022 1136   PROT 6.8 03/06/2022 1136   ALBUMIN 4.0 03/06/2022 1136   AST 22 03/06/2022 1136   ALT 16 03/06/2022 1136   ALKPHOS 121 03/06/2022 1136   BILITOT 0.2 03/06/2022 1136   GFRNONAA 32 (L) 09/15/2020 1037   GFRAA 48 (L) 07/09/2017 1112     Diabetic Labs (most recent): Lab Results  Component Value Date   HGBA1C 8.0 (A) 05/08/2022   HGBA1C 9.1 (A) 02/02/2022   HGBA1C 10.5 11/02/2021   MICROALBUR 150 07/11/2021     Lipid Panel ( most recent) Lipid Panel     Component Value Date/Time   CHOL 113 03/06/2022 1136   TRIG 146 03/06/2022 1136    HDL 43 03/06/2022 1136   CHOLHDL 2.6 03/06/2022 1136   [place] 45 03/06/2022 1136   LABVLDL 25 03/06/2022 1136      Lab Results  Component Value Date   TSH 3.880 04/17/2022   TSH 5.750 (H) 11/04/2021   TSH 1.533 09/29/2020   FREET4 1.50 04/17/2022   FREET4 1.06 11/04/2021           Assessment & Plan:   1) Type 2 diabetes mellitus with stage 3b chronic kidney disease, with long-term current use of insulin ([place])  She presents today, accompanied by her husband with her CGM showing improved but still above target glycemic profile overall.  Her POCT A1c today is 8%, improving from last visit of 9.1%.  Analysis of her CGM shows TIR 25%, TAR 75%, TBR 0% with a GMI of 8.4%.  She notes she has no appetite but has tolerated the Mounjaro well.  - Jessica Strong has currently uncontrolled symptomatic type 2 DM since 77 years of age.   -Recent labs reviewed.  - I had a long discussion with her about the progressive nature of diabetes and the pathology behind its complications. -her diabetes is complicated by CVA, MI, CAD, OSA, CKD and she remains at a high risk for more acute and chronic complications which include CAD, CVA, CKD, retinopathy, and neuropathy. These are all discussed in detail with her.  The following Lifestyle Medicine recommendations according to [facility]) were discussed and offered to patient and she agrees to start the journey:  A. Whole Foods, Plant-based plate comprising of fruits and vegetables,  plant-based proteins, whole-grain carbohydrates was discussed in detail with the patient.   A list for source of those nutrients were also provided to the patient.  Patient will use only water or unsweetened tea for hydration. B.  The need to stay away from risky substances including alcohol, smoking; obtaining 7 to 9 hours of restorative sleep, at least 150 minutes of moderate intensity exercise weekly, the importance of healthy social  connections,  and stress reduction techniques were discussed. C.  A full color page of  Calorie density of various food groups per pound showing examples of each food groups was provided to the patient.  - Nutritional counseling repeated at each appointment due to patients tendency to fall back in to old habits.  - The patient admits there is a room for improvement in their diet and drink choices. -  Suggestion is made for the patient to avoid simple carbohydrates from their diet including Cakes, Sweet Desserts / Pastries, Ice Cream, Soda (diet and regular), Sweet Tea, Candies, Chips, Cookies, Sweet Pastries, Store Bought Juices, Alcohol in Excess of 1-2 drinks a day, Artificial Sweeteners, Coffee Creamer, and "Sugar-free" Products. This will help patient to have stable blood glucose profile and potentially avoid unintended weight gain.   - I encouraged the patient to switch to unprocessed or minimally processed complex starch and increased protein intake (animal or plant source), fruits, and vegetables.   - Patient is advised to stick to a routine mealtimes to eat 3 meals a day and avoid unnecessary snacks (to snack only to correct hypoglycemia).  - she will be scheduled with Jearld Fenton, RDN, CDE for diabetes education.  - I have approached her with the following individualized plan to manage her diabetes and patient agrees:   -Will increase her Mounjaro to 12.5 mg SQ weekly.  She is advised to continue her Tresiba 55 units SQ nightly, Humalog 10-16 units TID with meals if glucose is above 90 and she is eating (Specific instructions on how to titrate insulin dosage based on glucose readings given to patient in writing).  I anticipate needing to lower her insulin dose on subsequent visits.  -she is encouraged to continue monitoring glucose 4 times daily (using her CGM), before meals and before bed, and to call the clinic if she has readings less than 70 or above 300 for 3 tests in a row.    -  she is warned not to take insulin without proper monitoring per orders. - Adjustment parameters are given to her for hypo and hyperglycemia in writing.  - her Metformin was discontinued, risk outweighs benefit for this patient given recent renal decline.  - Specific targets for  A1c; LDL, HDL, and Triglycerides were discussed with the patient.  2) Blood Pressure /Hypertension:  her blood pressure is controlled to target for her age.   she is advised to continue her current medications including Benicar 20 mg po daily, Lasix 20 mg po daily as needed.  3) Lipids/Hyperlipidemia:    Her most recent lipid panel from 03/06/22 shows controlled LDL of 45.  She is advised to continue Zocor 20 mg po daily at bedtime.   4)  Weight/Diet:  her Body mass index is 53.71 kg/m.  -  clearly complicating her diabetes care.   she is a candidate for weight loss. I discussed with her the fact that loss of 5 - 10% of her  current body weight will have the most impact on her diabetes management.  Exercise, and detailed carbohydrates  information provided  -  detailed on discharge instructions.  5) Chronic Care/Health Maintenance: -she is on ACEI/ARB and not on Statin medications and is encouraged to initiate and continue to follow up with Ophthalmology, Dentist, Podiatrist at least yearly or according to recommendations, and advised to stay away from smoking. I have recommended yearly flu vaccine and pneumonia vaccine at least every 5 years; moderate intensity exercise for up to 150 minutes weekly; and sleep for at least 7 hours a day.  - she is advised to maintain close follow up with Alvira Monday, FNP for primary care needs, as well as her other providers for optimal and coordinated care.     I spent 30 minutes in the care of the patient today including review of labs from [place], Lipids, Thyroid Function, Hematology (current and previous including abstractions from other facilities); face-to-face time discussing   her blood glucose readings/logs, discussing hypoglycemia and hyperglycemia episodes and symptoms, medications doses, her options of short and long term treatment based on the latest standards of care / guidelines;  discussion about incorporating lifestyle medicine;  and documenting the encounter. Risk reduction counseling performed per USPSTF guidelines to reduce obesity and cardiovascular risk factors.     Please refer to Patient Instructions for Blood Glucose Monitoring and Insulin/Medications Dosing Guide"  in media tab for additional information. Please  also refer to " Patient Self Inventory" in the Media  tab for reviewed elements of pertinent patient history.  Jessica Strong participated in the discussions, expressed understanding, and voiced agreement with the above plans.  All questions were answered to her satisfaction. she is encouraged to contact clinic should she have any questions or concerns prior to her return visit.     Follow up plan: - Return in about 4 months (around 09/06/2022) for Diabetes F/U with A1c in office, No previsit labs, Bring meter and logs.   Rayetta Pigg, [facility] [address] [place], [place] [address] Phone: [phone] Fax: [phone]  05/08/2022, 3:28 PM

## 2022-05-24 ENCOUNTER — Ambulatory Visit (INDEPENDENT_AMBULATORY_CARE_PROVIDER_SITE_OTHER): Payer: Medicare Other | Admitting: Family Medicine

## 2022-05-24 ENCOUNTER — Encounter: Payer: Self-pay | Admitting: Family Medicine

## 2022-05-24 VITALS — BP 132/82 | HR 85 | Ht 60.0 in | Wt 274.0 lb

## 2022-05-24 DIAGNOSIS — M545 Low back pain, unspecified: Secondary | ICD-10-CM

## 2022-05-24 DIAGNOSIS — R0602 Shortness of breath: Secondary | ICD-10-CM

## 2022-05-24 DIAGNOSIS — R059 Cough, unspecified: Secondary | ICD-10-CM | POA: Diagnosis not present

## 2022-05-24 DIAGNOSIS — I10 Essential (primary) hypertension: Secondary | ICD-10-CM | POA: Diagnosis not present

## 2022-05-24 DIAGNOSIS — E1101 Type 2 diabetes mellitus with hyperosmolarity with coma: Secondary | ICD-10-CM | POA: Diagnosis not present

## 2022-05-24 DIAGNOSIS — E038 Other specified hypothyroidism: Secondary | ICD-10-CM

## 2022-05-24 DIAGNOSIS — Z794 Long term (current) use of insulin: Secondary | ICD-10-CM

## 2022-05-24 DIAGNOSIS — E7849 Other hyperlipidemia: Secondary | ICD-10-CM

## 2022-05-24 MED ORDER — SIMVASTATIN 20 MG PO TABS: 20.0000 mg | ORAL_TABLET | Freq: Every day | ORAL | 0 refills | Status: DC

## 2022-05-24 MED ORDER — BENZONATATE 100 MG PO CAPS: 100.0000 mg | ORAL_CAPSULE | Freq: Two times a day (BID) | ORAL | 0 refills | Status: DC | PRN

## 2022-05-24 MED ORDER — ALBUTEROL SULFATE HFA 108 (90 BASE) MCG/ACT IN AERS: 2.0000 | INHALATION_SPRAY | Freq: Four times a day (QID) | RESPIRATORY_TRACT | 2 refills | Status: AC | PRN

## 2022-05-24 MED ORDER — OLMESARTAN MEDOXOMIL 20 MG PO TABS: 20.0000 mg | ORAL_TABLET | Freq: Every day | ORAL | 1 refills | Status: DC

## 2022-05-24 NOTE — Patient Instructions (Signed)
I appreciate the opportunity to provide care to you today!    Follow up:  3 months  Labs: next visit  Conservative managements for Low Back Pain include Heat therapy --  helps reduce muscle spasm Cold Therapy- helps reduce edema Take GYF:VCBSWHQ as needed      Please continue to a heart-healthy diet and increase your physical activities. Try to exercise for 5mns at least five times a week.   Physical activity helps: Lower your blood glucose, improve your heart health, lower your blood pressure and cholesterol, burn calories to help manage her weight, gave you energy, lower stress, and improve his sleep.  The American diabetes Association (ADA) recommends being active for 2-1/2 hours (150 minutes) or more week.  Exercise for 30 minutes, 5 days a week (150 minutes total)   Eat three meals per day at times discussed. Cut out all diet bevergages and drink only water Eat whole food plant based meals Cut out junk food, fast food and processed foods Exercise 150 minutes a week Lose 1-2 lbs per week. Keep a food journal Choose foods that grow in a garden or in a fruit orchard and protein of animals with fins or feathers.  Lifestyle Medicine   - Whole Food, Plant Predominant Nutrition is highly recommended: Eat Plenty of vegetables, Mushrooms, fruits, Legumes, Whole Grains, Nuts, seeds in lieu of processed meats, processed snacks/pastries red meat, poultry, eggs.    -It is better to avoid simple carbohydrates including: Cakes, Sweet Desserts, Ice Cream, Soda (diet and regular), Sweet Tea, Candies, Chips, Cookies, Store Bought Juices, Alcohol in Excess of  1-2 drinks a day, Lemonade,  Artificial Sweeteners, Doughnuts, Coffee Creamers, "Sugar-free" Products, etc, etc.  This is not a complete list.....   Exercise: If you are able: 30 -60 minutes a day ,4 days a week, or 150 minutes a week.  The longer the better.  Combine stretch, strength, and aerobic activities.  If you were told in the  past that you have high risk for cardiovascular diseases, you may seek evaluation by your heart doctor prior to initiating moderate to intense exercise programs.        It was a pleasure to see you and I look forward to continuing to work together on your health and well-being. Please do not hesitate to call the office if you need care or have questions about your care.   Have a wonderful day and week. With Gratitude, GAlvira MondayMSN, FNP-BC

## 2022-05-24 NOTE — Progress Notes (Unsigned)
Established Patient Office Visit  Subjective:  Patient ID: Jessica Strong, female    DOB: 1945-06-06  Age: 77 y.o. MRN: [id number]  CC:  Chief Complaint  Patient presents with   Follow-up    Follow up today, pt reports on and off cough for about a month now since 04/19/2022. Has not used anything for this. Has a little bit of wheezing going on. Pt has fmla forms to have completed.    Back Pain    Pt reports low back pain since 02/21/23. Worsens with activity. Would like to have an rx to help with this.     HPI [place] Nhem is a 77 y.o. female with past medical history of cerebrovascular accident, essential hypertension, type 2 diabetes, hypothyroidism, shortness of breath, morbid obesity, and hyperlipidemia presents for f/u of  chronic medical conditions. For the details of today's visit, please refer to the assessment and plan.     Past Medical History:  Diagnosis Date   Allergy    Anemia    Anxiety    Arthritis    Cataract    cervical ca 1990   Depression    Diabetes ([place])    Hypercholesterolemia    Hypertension    Myocardial infarction ([place])    Sleep apnea    on CPAP 14 mm PS since February 2017   Stroke [facility])    weakness on R side   Vitamin D deficiency     Past Surgical History:  Procedure Laterality Date   ABDOMINAL HYSTERECTOMY     BARIATRIC SURGERY     BIOPSY  09/17/2020   Procedure: BIOPSY;  Surgeon: Harvel Quale, MD;  [facility];  Service: Gastroenterology;;   CARDIAC CATHETERIZATION N/A 12/24/2015   Procedure: Right/Left Heart Cath and Coronary Angiography;  Surgeon: Jettie Booze, MD;  Location: [place] [facility];  Service: Cardiovascular;  Laterality: N/A;   COLONOSCOPY  2018   COLONOSCOPY WITH PROPOFOL N/A 09/17/2020   Procedure: COLONOSCOPY WITH PROPOFOL;  Surgeon: Harvel Quale, MD;  [facility];  Service: Gastroenterology;  Laterality: N/A;  9:00   ESOPHAGOGASTRODUODENOSCOPY (EGD) WITH  PROPOFOL N/A 09/17/2020   Procedure: ESOPHAGOGASTRODUODENOSCOPY (EGD) WITH PROPOFOL;  Surgeon: Harvel Quale, MD;  Location[facility];  Service: Gastroenterology;  Laterality: N/A;   HERNIA REPAIR     LEFT HEART CATH AND CORONARY ANGIOGRAPHY N/A 07/12/2017   Procedure: LEFT HEART CATH AND CORONARY ANGIOGRAPHY;  Surgeon: Belva Crome, MD;  Location: [place] [facility];  Service: Cardiovascular;  Laterality: N/A;    Family History  Problem Relation Age of Onset   Cancer Mother    Colon cancer Mother    Diabetes Mother    Hypertension Father    Thyroid disease Father    Thyroid cancer Father    Diabetes Father    Esophageal cancer Father    Breast cancer Maternal Aunt    Colon cancer Maternal Aunt    Breast cancer Maternal Aunt    Colon cancer Maternal Grandmother    Stroke Maternal Grandfather    Stomach cancer Paternal Grandfather    Colon cancer Son    Colon polyps Neg Hx    Rectal cancer Neg Hx     Social History   Socioeconomic History   Marital status: Married    Spouse name: Not on file   Number of children: 2   Years of education: Not on file   Highest education level: Not on file  Occupational History   Occupation: retired  Tobacco Use   Smoking status: Never    Passive exposure: Never   Smokeless tobacco: Never  Vaping Use   Vaping Use: Never used  Substance and Sexual Activity   Alcohol use: Not Currently    Comment: occasionally   Drug use: No   Sexual activity: Never    Partners: Male  Other Topics Concern   Not on file  Social History Narrative   Not on file   Social Determinants of Health   Financial Resource Strain: Not on file  Food Insecurity: Not on file  Transportation Needs: Not on file  Physical Activity: Not on file  Stress: Not on file  Social Connections: Not on file  Intimate Partner Violence: Not on file    Outpatient Medications Prior to Visit  Medication Sig Dispense Refill   ACCU-CHEK AVIVA PLUS test strip  Inject 1 strip as directed daily.  4   acetaminophen (TYLENOL) 500 MG tablet Take 500 mg by mouth every 6 (six) hours as needed for mild pain or headache. As needed.     aspirin 325 MG tablet Take 325 mg by mouth daily.     bisacodyl (DULCOLAX) 5 MG EC tablet Take 5 mg by mouth. One every other day     Calcium Carb-Cholecalciferol (CALCIUM 600-D PO) Take 1 tablet by mouth daily.     EPINEPHrine 0.3 mg/0.3 mL IJ SOAJ injection Inject 0.3 mg into the muscle as needed for anaphylaxis.     ferrous sulfate 325 (65 FE) MG tablet Take 325 mg by mouth daily with breakfast.     furosemide (LASIX) 20 MG tablet TAKE 1 TABLET (20 MG TOTAL) BY  MOUTH DAILY. (MAY TAKE EXTRA 1  TABLET AS NEEDED FOR SEVERE  SWELLING.) 150 tablet 2   HUMALOG KWIKPEN 100 UNIT/ML KwikPen Inject 5-11 Units into the skin 3 (three) times daily with meals. 30 mL 3   Lancets Thin MISC Inject 1 Stick into the skin daily.     levothyroxine (SYNTHROID) 75 MCG tablet Take 1 tablet (75 mcg total) by mouth daily before breakfast. 90 tablet 1   Multiple Vitamins-Minerals (MULTIVITAMIN ADULT) CHEW Chew 1 tablet by mouth daily at 12 noon.     NOVOFINE PEN NEEDLE 32G X 6 MM MISC USE 1 NEEDLE 3 TIMES DAILY 200 each 2   potassium chloride (KLOR-CON M) 10 MEQ tablet TAKE 1 TABLET BY MOUTH DAILY 60 tablet 5   terbinafine (LAMISIL AT) 1 % cream Apply 1 Application topically 2 (two) times daily. 30 g 0   tirzepatide (MOUNJARO) 12.5 MG/0.5ML Pen Inject 12.5 mg into the skin once a week. 6 mL 1   tolterodine (DETROL LA) 4 MG 24 hr capsule Take 1 capsule (4 mg total) by mouth daily. 30 capsule 11   TRESIBA FLEXTOUCH 200 UNIT/ML FlexTouch Pen Inject 56 Units into the skin at bedtime. 30 mL 1   Vibegron (GEMTESA) 75 MG TABS Take 75 mg by mouth daily. 30 tablet 11   olmesartan (BENICAR) 20 MG tablet TAKE 1 TABLET BY MOUTH DAILY 60 tablet 5   simvastatin (ZOCOR) 20 MG tablet Take 20 mg by mouth at bedtime.     lubiprostone (AMITIZA) 8 MCG capsule Take 1  capsule (8 mcg total) by mouth 2 (two) times daily with a meal. (Patient not taking: Reported on 04/17/2022) 60 capsule 1   No facility-administered medications prior to visit.    Allergies  Allergen Reactions   Corn Oil Rash  Other Rash    PT STATES SHE IS ALLERGIC TO MOST FOODS-STATES IT WAS FOUND IN ALLERGY TESTING   Peanut Allergen Powder-Dnfp Rash   Peanut-Containing Drug Products Rash   Penicillins Rash    Has patient had a PCN reaction causing immediate rash, facial/tongue/throat swelling, SOB or lightheadedness with hypotension: Yes Has patient had a PCN reaction causing severe rash involving mucus membranes or skin necrosis: No Has patient had a PCN reaction that required hospitalization No Has patient had a PCN reaction occurring within the last 10 years: No. Has not had in 40-50 years If all of the above answers are "NO", then may proceed with Cephalosporin use.    ROS Review of Systems  Constitutional:  Negative for chills and fever.  HENT:  Negative for congestion, ear pain, rhinorrhea, sinus pressure, sinus pain and sore throat.   Eyes:  Negative for visual disturbance.  Respiratory:  Positive for cough. Negative for chest tightness and shortness of breath.   Musculoskeletal:  Positive for back pain.  Neurological:  Negative for dizziness and headaches.      Objective:    Physical Exam HENT:     Head: Normocephalic.     Mouth/Throat:     Mouth: Mucous membranes are moist.  Cardiovascular:     Rate and Rhythm: Normal rate.     Heart sounds: Normal heart sounds.  Pulmonary:     Effort: Pulmonary effort is normal. No respiratory distress.     Breath sounds: Normal breath sounds. No stridor. No wheezing, rhonchi or rales.  Musculoskeletal:     Comments: No pain with palpation after lumbar spine musculature Negative CVA tenderness No overlying skin changes   Neurological:     Mental Status: She is alert.     BP 132/82   Pulse 85   Ht 5' (1.524 m)    Wt 274 lb 0.6 oz (124.3 kg)   SpO2 95%   BMI 53.52 kg/m  Wt Readings from Last 3 Encounters:  05/24/22 274 lb 0.6 oz (124.3 kg)  05/08/22 275 lb (124.7 kg)  04/17/22 272 lb 14.4 oz (123.8 kg)    Lab Results  Component Value Date   TSH 3.880 04/17/2022   Lab Results  Component Value Date   WBC 10.8 03/06/2022   HGB 10.9 (L) 03/06/2022   HCT 35.8 03/06/2022   MCV 84 03/06/2022   PLT 326 03/06/2022   Lab Results  Component Value Date   NA 142 03/06/2022   K 5.5 (H) 03/06/2022   CO2 19 (L) 03/06/2022   GLUCOSE 187 (H) 03/06/2022   BUN 31 (H) 03/06/2022   CREATININE 1.76 (H) 03/06/2022   BILITOT 0.2 03/06/2022   ALKPHOS 121 03/06/2022   AST 22 03/06/2022   ALT 16 03/06/2022   PROT 6.8 03/06/2022   ALBUMIN 4.0 03/06/2022   CALCIUM 9.4 03/06/2022   ANIONGAP 10 09/15/2020   EGFR 30 (L) 03/06/2022   Lab Results  Component Value Date   CHOL 113 03/06/2022   Lab Results  Component Value Date   HDL 43 03/06/2022   Lab Results  Component Value Date   LDLCALC 45 03/06/2022   Lab Results  Component Value Date   TRIG 146 03/06/2022   Lab Results  Component Value Date   CHOLHDL 2.6 03/06/2022   Lab Results  Component Value Date   HGBA1C 8.0 (A) 05/08/2022      Assessment & Plan:  Type 2 diabetes mellitus with hyperosmolar coma, with long-term current use of  insulin [facility]) Assessment & Plan: She followed up with her endocrinologist on 05/08/2022 Her hemoglobin A1c has decreased from 9.1 to 8.0 Her Mounjaro was increased to 12.'5mg'$  [place] qwk She also takes [place] 55 units SQ nightly, Humalog 10-16 units TID with meals if glucose is above 90 and she is eating She checks her blood sugar 4 times daily before meals and before bedtime and is advised to call if her blood sugars less than 70 or above 300 for 3 tests in the row She denies polyuria, polyphagia, and polydipsia Lab Results  Component Value Date   HGBA1C 8.0 (A) 05/08/2022     Essential  hypertension Assessment & Plan: Controlled She takes olmesartan 20 mg and Lasix 20 mg as needed for peripheral edema No edema noted today Patient is asymptomatic Encouraged low-sodium diet with increased physical activity BP Readings from Last 3 Encounters:  05/24/22 132/82  05/08/22 (!) 145/75  04/17/22 (!) 154/74      Orders: -     Olmesartan Medoxomil; Take 1 tablet (20 mg total) by mouth daily.  Dispense: 90 tablet; Refill: 1  SOB (shortness of breath) on exertion Assessment & Plan: Complains of shortness of breath with exertion She admits to minimal physical activity, noting to become short of breath when walking short distances Reports not using her albuterol inhaler No cardiac symptoms reported Patient has BMI is 53.52 Encouraged to increase her physical activities and use of her albuterol inhaler as needed  Orders: -     Albuterol Sulfate HFA; Inhale 2 puffs into the lungs every 6 (six) hours as needed for wheezing or shortness of breath.  Dispense: 8 g; Refill: 2  Cough in adult Assessment & Plan: Complains of a dry nonproductive cough Off and on since 04/19/2022 She denies fever, chills, congestion, body aches, facial pressure, sore throat, and runny nose Will treat symptomatically with Tessalon Perles   Orders: -     Benzonatate; Take 1 capsule (100 mg total) by mouth 2 (two) times daily as needed for cough.  Dispense: 20 capsule; Refill: 0  Other specified hypothyroidism Assessment & Plan: Compliant with Synthroid 75 mcg daily No concerns or complaints voiced Will assess thyroid levels at her next visit   Other hyperlipidemia Assessment & Plan: She takes simvastatin 20 mg daily Denies muscle aches and pain Encouraged to stay on therapy Lab Results  Component Value Date   CHOL 113 03/06/2022   HDL 43 03/06/2022   LDLCALC 45 03/06/2022   TRIG 146 03/06/2022   CHOLHDL 2.6 03/06/2022    Orders: -     Simvastatin; Take 1 tablet (20 mg total) by  mouth at bedtime.  Dispense: 90 tablet; Refill: 0  Lumbar pain Assessment & Plan: Complains of low back pain when ambulating and standing erect No recent injury or trauma to the lower back No red flag symptoms reported Lumbar pain is not radiating, no symptoms of sciatica voiced No reported treatments taken Patient's BMI is 53.52 Lumbar pain likely due to her BMI Encouraged heat application, over-the-counter Tylenol, and maintaining a healthy BMI Patient voiced understanding       Follow-up: Return in about 3 months (around 08/23/2022).   Alvira Monday, FNP

## 2022-05-25 DIAGNOSIS — M545 Low back pain, unspecified: Secondary | ICD-10-CM | POA: Insufficient documentation

## 2022-05-25 DIAGNOSIS — R059 Cough, unspecified: Secondary | ICD-10-CM | POA: Insufficient documentation

## 2022-05-25 NOTE — Assessment & Plan Note (Signed)
Complains of shortness of breath with exertion She admits to minimal physical activity, noting to become short of breath when walking short distances Reports not using her albuterol inhaler No cardiac symptoms reported Patient has BMI is 53.52 Encouraged to increase her physical activities and use of her albuterol inhaler as needed

## 2022-05-25 NOTE — Assessment & Plan Note (Signed)
Complains of a dry nonproductive cough Off and on since 04/19/2022 She denies fever, chills, congestion, body aches, facial pressure, sore throat, and runny nose Will treat symptomatically with Ladona Ridgel

## 2022-05-25 NOTE — Assessment & Plan Note (Signed)
Complains of low back pain when ambulating and standing erect No recent injury or trauma to the lower back No red flag symptoms reported Lumbar pain is not radiating, no symptoms of sciatica voiced No reported treatments taken Patient's BMI is 53.52 Lumbar pain likely due to her BMI Encouraged heat application, over-the-counter Tylenol, and maintaining a healthy BMI Patient voiced understanding

## 2022-05-25 NOTE — Assessment & Plan Note (Signed)
She followed up with her endocrinologist on 05/08/2022 Her hemoglobin A1c has decreased from 9.1 to 8.0 Her Mounjaro was increased to 12.'5mg'$  [place] qwk She also takes [place] 55 units SQ nightly, Humalog 10-16 units TID with meals if glucose is above 90 and she is eating She checks her blood sugar 4 times daily before meals and before bedtime and is advised to call if her blood sugars less than 70 or above 300 for 3 tests in the row She denies polyuria, polyphagia, and polydipsia Lab Results  Component Value Date   HGBA1C 8.0 (A) 05/08/2022

## 2022-05-25 NOTE — Assessment & Plan Note (Signed)
Compliant with Synthroid 75 mcg daily No concerns or complaints voiced Will assess thyroid levels at her next visit

## 2022-05-25 NOTE — Assessment & Plan Note (Signed)
Controlled She takes olmesartan 20 mg and Lasix 20 mg as needed for peripheral edema No edema noted today Patient is asymptomatic Encouraged low-sodium diet with increased physical activity BP Readings from Last 3 Encounters:  05/24/22 132/82  05/08/22 (!) 145/75  04/17/22 (!) 154/74

## 2022-05-25 NOTE — Assessment & Plan Note (Signed)
She takes simvastatin 20 mg daily Denies muscle aches and pain Encouraged to stay on therapy Lab Results  Component Value Date   CHOL 113 03/06/2022   HDL 43 03/06/2022   LDLCALC 45 03/06/2022   TRIG 146 03/06/2022   CHOLHDL 2.6 03/06/2022

## 2022-06-05 ENCOUNTER — Telehealth: Payer: Self-pay

## 2022-06-05 NOTE — Telephone Encounter (Signed)
-----   Message from Festus Aloe, MD sent at 06/05/2022 10:30 AM EST ----- I'm doing her botox in [place] on 06/16/2022. Please have her see me in [place] about 2 weeks later. Thanks

## 2022-06-05 NOTE — Telephone Encounter (Signed)
Apt scheduled for 03/04.  03/18 apt canceled and patient aware of apt change.  Reminder letter mailed.

## 2022-07-03 ENCOUNTER — Ambulatory Visit: Payer: Medicare Other | Admitting: Urology

## 2022-07-03 VITALS — BP 138/72 | HR 75 | Ht 60.0 in | Wt 274.0 lb

## 2022-07-03 DIAGNOSIS — N3281 Overactive bladder: Secondary | ICD-10-CM | POA: Diagnosis not present

## 2022-07-03 LAB — URINALYSIS, ROUTINE W REFLEX MICROSCOPIC
Bilirubin, UA: NEGATIVE
Glucose, UA: NEGATIVE
Ketones, UA: NEGATIVE
Leukocytes,UA: NEGATIVE
Nitrite, UA: NEGATIVE
Protein,UA: NEGATIVE
RBC, UA: NEGATIVE
Specific Gravity, UA: 1.02 (ref 1.005–1.030)
Urobilinogen, Ur: 0.2 mg/dL (ref 0.2–1.0)
pH, UA: 5 (ref 5.0–7.5)

## 2022-07-03 NOTE — Progress Notes (Signed)
07/03/2022 10:58 AM   Jessica Strong [birth year] [id number]  Referring provider: Alvira Monday, [address] [place],  [place] [address]  No chief complaint on file.   HPI:  F/u -   1) urinary frequency-she has frequency and urgency. She has incontinence. She wears pads and goes through several per day and night. She s/p caldera sling on 01/05/2018 with Dr. Carlota Raspberry. No change in incontinence. Maybe worse. She leaks with urgency. Not so much with stress. No gross hematuria. She tried Myrbetriq. 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 She does have constipation.  CT abdomen and pelvis March 2023 benign GU tract.  Punctate left renal stone.    PVR was 5 ml. Cystoscopy benign Jul 2023.She tried [place] which worked well for a week but then was less than effective. Standing triggers. She c/o urgency and leakage.    She underwent Jul 2023 UDS - pvr 50, C 100 ml, sens 55, desire 86. Unstable with severe leakage. No SUI. Normal voiding. She tried tolterodine which seemed to work for a few weeks and then stopped.   She started combination therapy. Noc improved but still has continual leakage without awareness in the day. When she needs to go to the bathroom she is wet.   Today, seen for the above. She underwent botox injection 100 U x 20 sites 06/16/2022. She is please and leaking less. Pad usage has gone down. Noc improved. She is voiding with an adequate stream.      PMH: Past Medical History:  Diagnosis Date   Allergy    Anemia    Anxiety    Arthritis    Cataract    cervical ca 1990   Depression    Diabetes ([place])    Hypercholesterolemia    Hypertension    Myocardial infarction ([place])    Sleep apnea    on CPAP 14 mm PS since February 2017   Stroke [facility])    weakness on R side   Vitamin D deficiency     Surgical History: Past Surgical History:  Procedure Laterality Date   ABDOMINAL HYSTERECTOMY     BARIATRIC SURGERY     BIOPSY  09/17/2020   Procedure: BIOPSY;  Surgeon:  Harvel Quale, MD;  [facility];  Service: Gastroenterology;;   CARDIAC CATHETERIZATION N/A 12/24/2015   Procedure: Right/Left Heart Cath and Coronary Angiography;  Surgeon: Jettie Booze, MD;  Location: [place] [facility];  Service: Cardiovascular;  Laterality: N/A;   COLONOSCOPY  2018   COLONOSCOPY WITH PROPOFOL N/A 09/17/2020   Procedure: COLONOSCOPY WITH PROPOFOL;  Surgeon: Harvel Quale, MD;  [facility];  Service: Gastroenterology;  Laterality: N/A;  9:00   ESOPHAGOGASTRODUODENOSCOPY (EGD) WITH PROPOFOL N/A 09/17/2020   Procedure: ESOPHAGOGASTRODUODENOSCOPY (EGD) WITH PROPOFOL;  Surgeon: Harvel Quale, MD;  [facility];  Service: Gastroenterology;  Laterality: N/A;   HERNIA REPAIR     LEFT HEART CATH AND CORONARY ANGIOGRAPHY N/A 07/12/2017   Procedure: LEFT HEART CATH AND CORONARY ANGIOGRAPHY;  Surgeon: Belva Crome, MD;  Location: [place] [facility];  Service: Cardiovascular;  Laterality: N/A;    Home Medications:  Allergies as of 07/03/2022       Reactions   Corn Oil Rash   Other Rash   PT STATES SHE IS ALLERGIC TO MOST FOODS-STATES IT WAS FOUND IN ALLERGY TESTING   Peanut Allergen Powder-dnfp Rash   Peanut-containing Drug Products Rash   Penicillins Rash  Has patient had a PCN reaction causing immediate rash, facial/tongue/throat swelling, SOB or lightheadedness with hypotension: Yes Has patient had a PCN reaction causing severe rash involving mucus membranes or skin necrosis: No Has patient had a PCN reaction that required hospitalization No Has patient had a PCN reaction occurring within the last 10 years: No. Has not had in 40-50 years If all of the above answers are "NO", then may proceed with Cephalosporin use.        Medication List        Accurate as of July 03, 2022 10:58 AM. If you have any questions, ask your nurse or doctor.          Accu-Chek Aviva Plus test strip Generic drug:  glucose blood Inject 1 strip as directed daily.   acetaminophen 500 MG tablet Commonly known as: TYLENOL Take 500 mg by mouth every 6 (six) hours as needed for mild pain or headache. As needed.   albuterol 108 (90 Base) MCG/ACT inhaler Commonly known as: VENTOLIN HFA Inhale 2 puffs into the lungs every 6 (six) hours as needed for wheezing or shortness of breath.   aspirin 325 MG tablet Take 325 mg by mouth daily.   benzonatate 100 MG capsule Commonly known as: TESSALON Take 1 capsule (100 mg total) by mouth 2 (two) times daily as needed for cough.   bisacodyl 5 MG EC tablet Commonly known as: DULCOLAX Take 5 mg by mouth. One every other day   CALCIUM 600-D PO Take 1 tablet by mouth daily.   EPINEPHrine 0.3 mg/0.3 mL Soaj injection Commonly known as: EPI-PEN Inject 0.3 mg into the muscle as needed for anaphylaxis.   ferrous sulfate 325 (65 FE) MG tablet Take 325 mg by mouth daily with breakfast.   furosemide 20 MG tablet Commonly known as: LASIX TAKE 1 TABLET (20 MG TOTAL) BY  MOUTH DAILY. (MAY TAKE EXTRA 1  TABLET AS NEEDED FOR SEVERE  SWELLING.)   Gemtesa 75 MG Tabs Generic drug: Vibegron Take 75 mg by mouth daily.   HumaLOG KwikPen 100 UNIT/ML KwikPen Generic drug: insulin lispro Inject 5-11 Units into the skin 3 (three) times daily with meals.   Lancets Thin Misc Inject 1 Stick into the skin daily.   levothyroxine 75 MCG tablet Commonly known as: SYNTHROID Take 1 tablet (75 mcg total) by mouth daily before breakfast.   lubiprostone 8 MCG capsule Commonly known as: Amitiza Take 1 capsule (8 mcg total) by mouth 2 (two) times daily with a meal.   Multivitamin Adult Chew Chew 1 tablet by mouth daily at 12 noon.   Novofine Pen Needle 32G X 6 MM Misc Generic drug: Insulin Pen Needle USE 1 NEEDLE 3 TIMES DAILY   olmesartan 20 MG tablet Commonly known as: BENICAR Take 1 tablet (20 mg total) by mouth daily.   potassium chloride 10 MEQ tablet Commonly known  as: KLOR-CON M TAKE 1 TABLET BY MOUTH DAILY   simvastatin 20 MG tablet Commonly known as: ZOCOR Take 1 tablet (20 mg total) by mouth at bedtime.   terbinafine 1 % cream Commonly known as: LamISIL AT Apply 1 Application topically 2 (two) times daily.   tirzepatide 12.5 MG/0.5ML Pen Commonly known as: MOUNJARO Inject 12.5 mg into the skin once a week.   tolterodine 4 MG 24 hr capsule Commonly known as: DETROL LA Take 1 capsule (4 mg total) by mouth daily.   Tyler Aas FlexTouch 200 UNIT/ML FlexTouch Pen Generic drug: insulin degludec Inject 56 Units into the  skin at bedtime.        Allergies:  Allergies  Allergen Reactions   Corn Oil Rash   Other Rash    PT STATES SHE IS ALLERGIC TO MOST FOODS-STATES IT WAS FOUND IN ALLERGY TESTING   Peanut Allergen Powder-Dnfp Rash   Peanut-Containing Drug Products Rash   Penicillins Rash    Has patient had a PCN reaction causing immediate rash, facial/tongue/throat swelling, SOB or lightheadedness with hypotension: Yes Has patient had a PCN reaction causing severe rash involving mucus membranes or skin necrosis: No Has patient had a PCN reaction that required hospitalization No Has patient had a PCN reaction occurring within the last 10 years: No. Has not had in 40-50 years If all of the above answers are "NO", then may proceed with Cephalosporin use.    Family History: Family History  Problem Relation Age of Onset   Cancer Mother    Colon cancer Mother    Diabetes Mother    Hypertension Father    Thyroid disease Father    Thyroid cancer Father    Diabetes Father    Esophageal cancer Father    Breast cancer Maternal Aunt    Colon cancer Maternal Aunt    Breast cancer Maternal Aunt    Colon cancer Maternal Grandmother    Stroke Maternal Grandfather    Stomach cancer Paternal Grandfather    Colon cancer Son    Colon polyps Neg Hx    Rectal cancer Neg Hx     Social History:  reports that she has never smoked. She has never  been exposed to tobacco smoke. She has never used smokeless tobacco. She reports that she does not currently use alcohol. She reports that she does not use drugs.   Physical Exam: There were no vitals taken for this visit.  Constitutional:  Alert and oriented, No acute distress. HEENT: [place] AT, moist mucus membranes.  Trachea midline, no masses. Cardiovascular: No clubbing, cyanosis, or edema. Respiratory: Normal respiratory effort, no increased work of breathing. GI: Abdomen is soft, nontender, nondistended, no abdominal masses GU: No CVA tenderness Skin: No rashes, bruises or suspicious lesions. Neurologic: Grossly intact, no focal deficits, moving all 4 extremities. Psychiatric: Normal mood and affect.  Laboratory Data: Lab Results  Component Value Date   WBC 10.8 03/06/2022   HGB 10.9 (L) 03/06/2022   HCT 35.8 03/06/2022   MCV 84 03/06/2022   PLT 326 03/06/2022    Lab Results  Component Value Date   CREATININE 1.76 (H) 03/06/2022     Lab Results  Component Value Date   HGBA1C 8.0 (A) 05/08/2022    Urinalysis    Component Value Date/Time   APPEARANCEUR Clear 04/17/2022 1349   GLUCOSEU Negative 04/17/2022 1349   BILIRUBINUR Negative 04/17/2022 1349   PROTEINUR Negative 04/17/2022 1349   UROBILINOGEN 0.2 03/06/2022 1416   NITRITE Negative 04/17/2022 1349   LEUKOCYTESUR Negative 04/17/2022 1349    Lab Results  Component Value Date   LABMICR Comment 04/17/2022   WBCUA 0-5 10/03/2021   LABEPIT 0-10 10/03/2021   BACTERIA Few 10/03/2021      Assessment & Plan:    OAB - doing well after botox.   No follow-ups on file.  Festus Aloe, MD  [facility]  [address] [place], [place] [address] [phone]

## 2022-07-17 ENCOUNTER — Ambulatory Visit: Payer: Medicare Other | Admitting: Urology

## 2022-08-18 LAB — HEPATITIS C ANTIBODY: Hep C Virus Ab: NONREACTIVE

## 2022-08-21 NOTE — Progress Notes (Signed)
Please inform the patient that her hepatitis C was negative

## 2022-08-23 ENCOUNTER — Encounter: Payer: Self-pay | Admitting: Family Medicine

## 2022-08-23 ENCOUNTER — Ambulatory Visit (INDEPENDENT_AMBULATORY_CARE_PROVIDER_SITE_OTHER): Payer: Medicare Other | Admitting: Family Medicine

## 2022-08-23 VITALS — BP 117/73 | HR 69 | Ht 60.0 in | Wt 273.0 lb

## 2022-08-23 DIAGNOSIS — E1101 Type 2 diabetes mellitus with hyperosmolarity with coma: Secondary | ICD-10-CM

## 2022-08-23 DIAGNOSIS — I10 Essential (primary) hypertension: Secondary | ICD-10-CM | POA: Diagnosis not present

## 2022-08-23 DIAGNOSIS — E559 Vitamin D deficiency, unspecified: Secondary | ICD-10-CM

## 2022-08-23 DIAGNOSIS — E038 Other specified hypothyroidism: Secondary | ICD-10-CM

## 2022-08-23 DIAGNOSIS — Z794 Long term (current) use of insulin: Secondary | ICD-10-CM

## 2022-08-23 DIAGNOSIS — E7849 Other hyperlipidemia: Secondary | ICD-10-CM

## 2022-08-23 DIAGNOSIS — E876 Hypokalemia: Secondary | ICD-10-CM

## 2022-08-23 DIAGNOSIS — Z1231 Encounter for screening mammogram for malignant neoplasm of breast: Secondary | ICD-10-CM

## 2022-08-23 MED ORDER — OLMESARTAN MEDOXOMIL 20 MG PO TABS: 20.0000 mg | ORAL_TABLET | Freq: Every day | ORAL | 1 refills | Status: DC

## 2022-08-23 MED ORDER — SIMVASTATIN 20 MG PO TABS: 20.0000 mg | ORAL_TABLET | Freq: Every day | ORAL | 0 refills | Status: DC

## 2022-08-23 MED ORDER — POTASSIUM CHLORIDE CRYS ER 10 MEQ PO TBCR
10.0000 meq | EXTENDED_RELEASE_TABLET | Freq: Every day | ORAL | 5 refills | Status: DC
Start: 2022-08-23 — End: 2023-06-27

## 2022-08-23 NOTE — Progress Notes (Unsigned)
Established Patient Office Visit  Subjective:  Patient ID: Jessica ELICKER, female    DOB: [birth year]  Age: 77 y.o. MRN: [id number]  CC:  Chief Complaint  Patient presents with   Diabetes    HPI Jessica Strong is a 77 y.o. female with past medical history of type 2 diabetes, hypertension, hyperlipidemia, and hypothyroidism presents for f/u of  chronic medical conditions. For the details of today's visit, please refer to the assessment and plan.     Past Medical History:  Diagnosis Date   Allergy    Anemia    Anxiety    Arthritis    Cataract    cervical ca 1990   Depression    Diabetes    Hypercholesterolemia    Hypertension    Myocardial infarction    Sleep apnea    on CPAP 14 mm PS since February 2017   Stroke    weakness on R side   Vitamin D deficiency     Past Surgical History:  Procedure Laterality Date   ABDOMINAL HYSTERECTOMY     BARIATRIC SURGERY     BIOPSY  09/17/2020   Procedure: BIOPSY;  Surgeon: Dolores Frame, MD;  [facility];  Service: Gastroenterology;;   CARDIAC CATHETERIZATION N/A 12/24/2015   Procedure: Right/Left Heart Cath and Coronary Angiography;  Surgeon: Corky Crafts, MD;  [facility]: [facility] INV[facility];  Service: Cardiovascular;  Laterality: N/A;   COLONOSCOPY  2018   COLONOSCOPY WITH PROPOFOL N/A 09/17/2020   Procedure: COLONOSCOPY WITH PROPOFOL;  Surgeon: Dolores Frame, MD;  [facility];  Service: Gastroenterology;  Laterality: N/A;  9:00   ESOPHAGOGASTRODUODENOSCOPY (EGD) WITH PROPOFOL N/A 09/17/2020   Procedure: ESOPHAGOGASTRODUODENOSCOPY (EGD) WITH PROPOFOL;  Surgeon: Dolores Frame, MD;  [facility];  Service: Gastroenterology;  Laterality: N/A;   HERNIA REPAIR     LEFT HEART CATH AND CORONARY ANGIOGRAPHY N/A 07/12/2017   Procedure: LEFT HEART CATH AND CORONARY ANGIOGRAPHY;  Surgeon: Lyn Records, MD;  [facility];  Service: Cardiovascular;   Laterality: N/A;    Family History  Problem Relation Age of Onset   Cancer Mother    Colon cancer Mother    Diabetes Mother    Hypertension Father    Thyroid disease Father    Thyroid cancer Father    Diabetes Father    Esophageal cancer Father    Breast cancer Maternal Aunt    Colon cancer Maternal Aunt    Breast cancer Maternal Aunt    Colon cancer Maternal Grandmother    Stroke Maternal Grandfather    Stomach cancer Paternal Grandfather    Colon cancer Son    Colon polyps Neg Hx    Rectal cancer Neg Hx     Social History   Socioeconomic History   Marital status: Married    Spouse name: Not on file   Number of children: 2   Years of education: Not on file   Highest education level: Not on file  Occupational History   Occupation: retired  Tobacco Use   Smoking status: Never    Passive exposure: Never   Smokeless tobacco: Never  Vaping Use   Vaping Use: Never used  Substance and Sexual Activity   Alcohol use: Not Currently    Comment: occasionally   Drug use: No   Sexual activity: Never    Partners: Male  Other Topics Concern   Not on file  Social History Narrative  Not on file   Social Determinants of Health   Financial Resource Strain: Not on file  Food Insecurity: Not on file  Transportation Needs: Not on file  Physical Activity: Not on file  Stress: Not on file  Social Connections: Not on file  Intimate Partner Violence: Not on file    Outpatient Medications Prior to Visit  Medication Sig Dispense Refill   ACCU-CHEK AVIVA PLUS test strip Inject 1 strip as directed daily.  4   acetaminophen (TYLENOL) 500 MG tablet Take 500 mg by mouth every 6 (six) hours as needed for mild pain or headache. As needed.     albuterol (VENTOLIN HFA) 108 (90 Base) MCG/ACT inhaler Inhale 2 puffs into the lungs every 6 (six) hours as needed for wheezing or shortness of breath. 8 g 2   aspirin 325 MG tablet Take 325 mg by mouth daily.     bisacodyl (DULCOLAX) 5 MG EC  tablet Take 5 mg by mouth. One every other day     Calcium Carb-Cholecalciferol (CALCIUM 600-D PO) Take 1 tablet by mouth daily.     EPINEPHrine 0.3 mg/0.3 mL IJ SOAJ injection Inject 0.3 mg into the muscle as needed for anaphylaxis.     ferrous sulfate 325 (65 FE) MG tablet Take 325 mg by mouth daily with breakfast.     furosemide (LASIX) 20 MG tablet TAKE 1 TABLET (20 MG TOTAL) BY  MOUTH DAILY. (MAY TAKE EXTRA 1  TABLET AS NEEDED FOR SEVERE  SWELLING.) 150 tablet 2   HUMALOG KWIKPEN 100 UNIT/ML KwikPen Inject 5-11 Units into the skin 3 (three) times daily with meals. 30 mL 3   Lancets Thin MISC Inject 1 Stick into the skin daily.     levothyroxine (SYNTHROID) 75 MCG tablet Take 1 tablet (75 mcg total) by mouth daily before breakfast. 90 tablet 1   lubiprostone (AMITIZA) 8 MCG capsule Take 1 capsule (8 mcg total) by mouth 2 (two) times daily with a meal. 60 capsule 1   Multiple Vitamins-Minerals (MULTIVITAMIN ADULT) CHEW Chew 1 tablet by mouth daily at 12 noon.     NOVOFINE PEN NEEDLE 32G X 6 MM MISC USE 1 NEEDLE 3 TIMES DAILY 200 each 2   terbinafine (LAMISIL AT) 1 % cream Apply 1 Application topically 2 (two) times daily. 30 g 0   tirzepatide (MOUNJARO) 12.5 MG/0.5ML Pen Inject 12.5 mg into the skin once a week. 6 mL 1   tolterodine (DETROL LA) 4 MG 24 hr capsule Take 1 capsule (4 mg total) by mouth daily. 30 capsule 11   TRESIBA FLEXTOUCH 200 UNIT/ML FlexTouch Pen Inject 56 Units into the skin at bedtime. 30 mL 1   Vibegron (GEMTESA) 75 MG TABS Take 75 mg by mouth daily. 30 tablet 11   olmesartan (BENICAR) 20 MG tablet Take 1 tablet (20 mg total) by mouth daily. 90 tablet 1   potassium chloride (KLOR-CON M) 10 MEQ tablet TAKE 1 TABLET BY MOUTH DAILY 60 tablet 5   simvastatin (ZOCOR) 20 MG tablet Take 1 tablet (20 mg total) by mouth at bedtime. 90 tablet 0   benzonatate (TESSALON) 100 MG capsule Take 1 capsule (100 mg total) by mouth 2 (two) times daily as needed for cough. (Patient not taking:  Reported on 08/23/2022) 20 capsule 0   No facility-administered medications prior to visit.    Allergies  Allergen Reactions   Corn Oil Rash   Other Rash    PT STATES SHE IS ALLERGIC TO MOST FOODS-STATES IT  WAS FOUND IN ALLERGY TESTING   Peanut Allergen Powder-Dnfp Rash   Peanut-Containing Drug Products Rash   Penicillins Rash    Has patient had a PCN reaction causing immediate rash, facial/tongue/throat swelling, SOB or lightheadedness with hypotension: Yes Has patient had a PCN reaction causing severe rash involving mucus membranes or skin necrosis: No Has patient had a PCN reaction that required hospitalization No Has patient had a PCN reaction occurring within the last 10 years: No. Has not had in 40-50 years If all of the above answers are "NO", then may proceed with Cephalosporin use.    ROS Review of Systems  Constitutional:  Negative for chills and fever.  Eyes:  Negative for visual disturbance.  Respiratory:  Negative for chest tightness and shortness of breath.   Neurological:  Negative for dizziness and headaches.      Objective:    Physical Exam HENT:     Head: Normocephalic.     Mouth/Throat:     Mouth: Mucous membranes are moist.  Cardiovascular:     Rate and Rhythm: Normal rate.     Heart sounds: Normal heart sounds.  Pulmonary:     Effort: Pulmonary effort is normal.     Breath sounds: Normal breath sounds.  Neurological:     Mental Status: She is alert.     BP 117/73   Pulse 69   Ht 5' (1.524 m)   Wt 273 lb (123.8 kg)   SpO2 94%   BMI 53.32 kg/m  Wt Readings from Last 3 Encounters:  08/23/22 273 lb (123.8 kg)  07/03/22 274 lb (124.3 kg)  05/24/22 274 lb 0.6 oz (124.3 kg)    Lab Results  Component Value Date   TSH 3.880 04/17/2022   Lab Results  Component Value Date   WBC 10.8 03/06/2022   HGB 10.9 (L) 03/06/2022   HCT 35.8 03/06/2022   MCV 84 03/06/2022   PLT 326 03/06/2022   Lab Results  Component Value Date   NA 142  03/06/2022   K 5.5 (H) 03/06/2022   CO2 19 (L) 03/06/2022   GLUCOSE 187 (H) 03/06/2022   BUN 31 (H) 03/06/2022   CREATININE 1.76 (H) 03/06/2022   BILITOT 0.2 03/06/2022   ALKPHOS 121 03/06/2022   AST 22 03/06/2022   ALT 16 03/06/2022   PROT 6.8 03/06/2022   ALBUMIN 4.0 03/06/2022   CALCIUM 9.4 03/06/2022   ANIONGAP 10 09/15/2020   EGFR 30 (L) 03/06/2022   Lab Results  Component Value Date   CHOL 113 03/06/2022   Lab Results  Component Value Date   HDL 43 03/06/2022   Lab Results  Component Value Date   LDLCALC 45 03/06/2022   Lab Results  Component Value Date   TRIG 146 03/06/2022   Lab Results  Component Value Date   CHOLHDL 2.6 03/06/2022   Lab Results  Component Value Date   HGBA1C 8.0 (A) 05/08/2022      Assessment & Plan:  Essential hypertension Assessment & Plan: Controlled She takes olmesartan 20 mg and Lasix 20 mg as needed for peripheral edema No edema noted today Patient is asymptomatic Encouraged low-sodium diet with increased physical activity BP Readings from Last 3 Encounters:  08/23/22 117/73  07/03/22 138/72  05/24/22 132/82      Orders: -     Olmesartan Medoxomil; Take 1 tablet (20 mg total) by mouth daily.  Dispense: 90 tablet; Refill: 1  Type 2 diabetes mellitus with hyperosmolar coma, with long-term current use of  insulin -     Microalbumin / creatinine urine ratio  Other hyperlipidemia Assessment & Plan: She takes simvastatin 20 mg daily Denies muscle aches and pain Pending labs Lab Results  Component Value Date   CHOL 113 03/06/2022   HDL 43 03/06/2022   LDLCALC 45 03/06/2022   TRIG 146 03/06/2022   CHOLHDL 2.6 03/06/2022    Orders: -     Simvastatin; Take 1 tablet (20 mg total) by mouth at bedtime.  Dispense: 90 tablet; Refill: 0 -     CMP14+EGFR -     CBC with Differential/Platelet -     Lipid panel  Other specified hypothyroidism Assessment & Plan: Compliant with Synthroid 75 mcg daily No concerns or  complaints voiced Will assess thyroid levels at her next visit Lab Results  Component Value Date   TSH 3.880 04/17/2022     Orders: -     TSH + free T4  Potassium deficiency -     Potassium Chloride Crys ER; Take 1 tablet (10 mEq total) by mouth daily.  Dispense: 60 tablet; Refill: 5  Vitamin D deficiency -     VITAMIN D 25 Hydroxy (Vit-D Deficiency, Fractures)  Breast cancer screening by mammogram -     3D Screening Mammogram, Left and Right    Follow-up: Return in about 3 months (around 11/22/2022).   Gilmore Laroche, FNP

## 2022-08-23 NOTE — Patient Instructions (Addendum)
I appreciate the opportunity to provide care to you today!    Follow up:  3 months  Labs: please stop by the lab during the week to get your blood drawn (CBC, CMP, TSH, Lipid profile, HgA1c, Vit D)   Please schedule mammogram   Please continue to a heart-healthy diet and increase your physical activities. Try to exercise for at least five days a week.      It was a pleasure to see you and I look forward to continuing to work together on your health and well-being. Please do not hesitate to call the office if you need care or have questions about your care.   Have a wonderful day and week. With Gratitude, Gilmore Laroche MSN, FNP-BC

## 2022-08-24 NOTE — Assessment & Plan Note (Signed)
She takes simvastatin 20 mg daily Denies muscle aches and pain Pending labs Lab Results  Component Value Date   CHOL 113 03/06/2022   HDL 43 03/06/2022   LDLCALC 45 03/06/2022   TRIG 146 03/06/2022   CHOLHDL 2.6 03/06/2022

## 2022-08-24 NOTE — Assessment & Plan Note (Signed)
Compliant with Synthroid 75 mcg daily No concerns or complaints voiced Will assess thyroid levels at her next visit Lab Results  Component Value Date   TSH 3.880 04/17/2022

## 2022-08-24 NOTE — Assessment & Plan Note (Signed)
Controlled She takes olmesartan 20 mg and Lasix 20 mg as needed for peripheral edema No edema noted today Patient is asymptomatic Encouraged low-sodium diet with increased physical activity BP Readings from Last 3 Encounters:  08/23/22 117/73  07/03/22 138/72  05/24/22 132/82

## 2022-09-06 ENCOUNTER — Ambulatory Visit: Payer: Medicare Other | Admitting: Nurse Practitioner

## 2022-09-06 ENCOUNTER — Encounter: Payer: Self-pay | Admitting: Nurse Practitioner

## 2022-09-06 VITALS — BP 126/73 | HR 69 | Ht 60.0 in | Wt 270.6 lb

## 2022-09-06 DIAGNOSIS — E782 Mixed hyperlipidemia: Secondary | ICD-10-CM

## 2022-09-06 DIAGNOSIS — E1101 Type 2 diabetes mellitus with hyperosmolarity with coma: Secondary | ICD-10-CM

## 2022-09-06 DIAGNOSIS — Z808 Family history of malignant neoplasm of other organs or systems: Secondary | ICD-10-CM | POA: Diagnosis not present

## 2022-09-06 DIAGNOSIS — E039 Hypothyroidism, unspecified: Secondary | ICD-10-CM

## 2022-09-06 DIAGNOSIS — N1832 Chronic kidney disease, stage 3b: Secondary | ICD-10-CM

## 2022-09-06 DIAGNOSIS — I10 Essential (primary) hypertension: Secondary | ICD-10-CM | POA: Diagnosis not present

## 2022-09-06 DIAGNOSIS — Z794 Long term (current) use of insulin: Secondary | ICD-10-CM

## 2022-09-06 DIAGNOSIS — E1122 Type 2 diabetes mellitus with diabetic chronic kidney disease: Secondary | ICD-10-CM | POA: Diagnosis not present

## 2022-09-06 MED ORDER — NOVOFINE PEN NEEDLE 32G X 6 MM MISC
2 refills | Status: DC
Start: 2022-09-06 — End: 2022-11-27

## 2022-09-06 MED ORDER — HUMALOG KWIKPEN 100 UNIT/ML ~~LOC~~ SOPN: 5.0000 [IU] | PEN_INJECTOR | Freq: Three times a day (TID) | SUBCUTANEOUS | 3 refills | Status: DC

## 2022-09-06 MED ORDER — TRESIBA FLEXTOUCH 200 UNIT/ML ~~LOC~~ SOPN: 55.0000 [IU] | PEN_INJECTOR | Freq: Every day | SUBCUTANEOUS | 3 refills | Status: DC

## 2022-09-06 MED ORDER — TIRZEPATIDE 12.5 MG/0.5ML ~~LOC~~ SOAJ: 12.5000 mg | SUBCUTANEOUS | 1 refills | Status: DC

## 2022-09-06 NOTE — Patient Instructions (Signed)

## 2022-09-06 NOTE — Progress Notes (Signed)
Endocrinology Follow Up Note       09/06/2022, 3:27 PM   Subjective:    Patient ID: Jessica Strong, female    DOB: 1946/01/24.  Jessica Strong is being seen in follow up after being seen in consultation for management of currently uncontrolled symptomatic diabetes requested by  Gilmore Laroche, FNP.   Past Medical History:  Diagnosis Date   Allergy    Anemia    Anxiety    Arthritis    Cataract    cervical ca 1990   Depression    Diabetes (HCC)    Hypercholesterolemia    Hypertension    Myocardial infarction (HCC)    Sleep apnea    on CPAP 14 mm PS since February 2017   Stroke [facility])    weakness on R side   Vitamin D deficiency     Past Surgical History:  Procedure Laterality Date   ABDOMINAL HYSTERECTOMY     BARIATRIC SURGERY     BIOPSY  09/17/2020   Procedure: BIOPSY;  Surgeon: Dolores Frame, MD;  Location: [facility];  Service: Gastroenterology;;   CARDIAC CATHETERIZATION N/A 12/24/2015   Procedure: Right/Left Heart Cath and Coronary Angiography;  Surgeon: Corky Crafts, MD;  Location: [facility] INVASIVE CV LAB;  Service: Cardiovascular;  Laterality: N/A;   COLONOSCOPY  2018   COLONOSCOPY WITH PROPOFOL N/A 09/17/2020   Procedure: COLONOSCOPY WITH PROPOFOL;  Surgeon: Dolores Frame, MD;  Location: [facility];  Service: Gastroenterology;  Laterality: N/A;  9:00   ESOPHAGOGASTRODUODENOSCOPY (EGD) WITH PROPOFOL N/A 09/17/2020   Procedure: ESOPHAGOGASTRODUODENOSCOPY (EGD) WITH PROPOFOL;  Surgeon: Dolores Frame, MD;  Location: [facility];  Service: Gastroenterology;  Laterality: N/A;   HERNIA REPAIR     LEFT HEART CATH AND CORONARY ANGIOGRAPHY N/A 07/12/2017   Procedure: LEFT HEART CATH AND CORONARY ANGIOGRAPHY;  Surgeon: Lyn Records, MD;  Location: [facility] INVASIVE CV LAB;  Service: Cardiovascular;  Laterality: N/A;    Social History   Socioeconomic History    Marital status: Married    Spouse name: Not on file   Number of children: 2   Years of education: Not on file   Highest education level: Not on file  Occupational History   Occupation: retired  Tobacco Use   Smoking status: Never    Passive exposure: Never   Smokeless tobacco: Never  Vaping Use   Vaping Use: Never used  Substance and Sexual Activity   Alcohol use: Not Currently    Comment: occasionally   Drug use: No   Sexual activity: Never    Partners: Male  Other Topics Concern   Not on file  Social History Narrative   Not on file   Social Determinants of Health   Financial Resource Strain: Not on file  Food Insecurity: Not on file  Transportation Needs: Not on file  Physical Activity: Not on file  Stress: Not on file  Social Connections: Not on file    Family History  Problem Relation Age of Onset   Cancer Mother    Colon cancer Mother    Diabetes Mother    Hypertension Father    Thyroid disease Father  Thyroid cancer Father    Diabetes Father    Esophageal cancer Father    Breast cancer Maternal Aunt    Colon cancer Maternal Aunt    Breast cancer Maternal Aunt    Colon cancer Maternal Grandmother    Stroke Maternal Grandfather    Stomach cancer Paternal Grandfather    Colon cancer Son    Colon polyps Neg Hx    Rectal cancer Neg Hx     Outpatient Encounter Medications as of 09/06/2022  Medication Sig   ACCU-CHEK AVIVA PLUS test strip Inject 1 strip as directed daily.   acetaminophen (TYLENOL) 500 MG tablet Take 500 mg by mouth every 6 (six) hours as needed for mild pain or headache. As needed.   albuterol (VENTOLIN HFA) 108 (90 Base) MCG/ACT inhaler Inhale 2 puffs into the lungs every 6 (six) hours as needed for wheezing or shortness of breath.   aspirin 325 MG tablet Take 325 mg by mouth daily.   bisacodyl (DULCOLAX) 5 MG EC tablet Take 5 mg by mouth. One every other day   Calcium Carb-Cholecalciferol (CALCIUM 600-D PO) Take 1 tablet by mouth daily.    EPINEPHrine 0.3 mg/0.3 mL IJ SOAJ injection Inject 0.3 mg into the muscle as needed for anaphylaxis.   ferrous sulfate 325 (65 FE) MG tablet Take 325 mg by mouth daily with breakfast.   furosemide (LASIX) 20 MG tablet TAKE 1 TABLET (20 MG TOTAL) BY  MOUTH DAILY. (MAY TAKE EXTRA 1  TABLET AS NEEDED FOR SEVERE  SWELLING.)   Lancets Thin MISC Inject 1 Stick into the skin daily.   levothyroxine (SYNTHROID) 75 MCG tablet Take 1 tablet (75 mcg total) by mouth daily before breakfast.   lubiprostone (AMITIZA) 8 MCG capsule Take 1 capsule (8 mcg total) by mouth 2 (two) times daily with a meal.   Multiple Vitamins-Minerals (MULTIVITAMIN ADULT) CHEW Chew 1 tablet by mouth daily at 12 noon.   olmesartan (BENICAR) 20 MG tablet Take 1 tablet (20 mg total) by mouth daily.   potassium chloride (KLOR-CON M) 10 MEQ tablet Take 1 tablet (10 mEq total) by mouth daily.   simvastatin (ZOCOR) 20 MG tablet Take 1 tablet (20 mg total) by mouth at bedtime.   terbinafine (LAMISIL AT) 1 % cream Apply 1 Application topically 2 (two) times daily.   tolterodine (DETROL LA) 4 MG 24 hr capsule Take 1 capsule (4 mg total) by mouth daily.   Vibegron (GEMTESA) 75 MG TABS Take 75 mg by mouth daily.   [DISCONTINUED] NOVOFINE PEN NEEDLE 32G X 6 MM MISC USE 1 NEEDLE 3 TIMES DAILY   [DISCONTINUED] tirzepatide (MOUNJARO) 12.5 MG/0.5ML Pen Inject 12.5 mg into the skin once a week.   [DISCONTINUED] TRESIBA FLEXTOUCH 200 UNIT/ML FlexTouch Pen Inject 56 Units into the skin at bedtime.   benzonatate (TESSALON) 100 MG capsule Take 1 capsule (100 mg total) by mouth 2 (two) times daily as needed for cough. (Patient not taking: Reported on 08/23/2022)   HUMALOG KWIKPEN 100 UNIT/ML KwikPen Inject 5-8 Units into the skin 3 (three) times daily.   NOVOFINE PEN NEEDLE 32G X 6 MM MISC Use to inject insulin 4 times daily   tirzepatide (MOUNJARO) 12.5 MG/0.5ML Pen Inject 12.5 mg into the skin once a week.   TRESIBA FLEXTOUCH 200 UNIT/ML FlexTouch Pen  Inject 56 Units into the skin at bedtime.   [DISCONTINUED] HUMALOG KWIKPEN 100 UNIT/ML KwikPen Inject 5-11 Units into the skin 3 (three) times daily with meals. (Patient not taking: Reported  on 09/06/2022)   No facility-administered encounter medications on file as of 09/06/2022.    ALLERGIES: Allergies  Allergen Reactions   Corn Oil Rash   Other Rash    PT STATES SHE IS ALLERGIC TO MOST FOODS-STATES IT WAS FOUND IN ALLERGY TESTING   Peanut Allergen Powder-Dnfp Rash   Peanut-Containing Drug Products Rash   Penicillins Rash    Has patient had a PCN reaction causing immediate rash, facial/tongue/throat swelling, SOB or lightheadedness with hypotension: Yes Has patient had a PCN reaction causing severe rash involving mucus membranes or skin necrosis: No Has patient had a PCN reaction that required hospitalization No Has patient had a PCN reaction occurring within the last 10 years: No. Has not had in 40-50 years If all of the above answers are "NO", then may proceed with Cephalosporin use.    VACCINATION STATUS: Immunization History  Administered Date(s) Administered   Moderna Sars-Covid-2 Vaccination 07/11/2019, 08/08/2019, 08/11/2020   PNEUMOCOCCAL CONJUGATE-20 02/21/2022   Zoster Recombinat (Shingrix) 08/17/2022    Diabetes She presents for her follow-up diabetic visit. She has type 2 diabetes mellitus. Onset time: Diagnosed at approx age of 37. Her disease course has been stable. There are no hypoglycemic associated symptoms. Pertinent negatives for hypoglycemia include no sweats or tremors. Associated symptoms include fatigue, foot paresthesias, polydipsia, polyuria and weight loss. Hypoglycemia complications include nocturnal hypoglycemia. Symptoms are improving. Diabetic complications include a CVA, heart disease, nephropathy and peripheral neuropathy. Risk factors for coronary artery disease include diabetes mellitus, dyslipidemia, family history, obesity, hypertension,  post-menopausal and sedentary lifestyle. Current diabetic treatment includes intensive insulin program and oral agent (monotherapy). She is compliant with treatment most of the time. Her weight is decreasing steadily. She is following a generally healthy diet. When asked about meal planning, she reported none. She has not had a previous visit with a dietitian. She rarely participates in exercise. Her home blood glucose trend is fluctuating minimally. Her breakfast blood glucose range is generally 110-130 mg/dl. Her lunch blood glucose range is generally 140-180 mg/dl. Her dinner blood glucose range is generally 180-200 mg/dl. Her bedtime blood glucose range is generally 180-200 mg/dl. Her overall blood glucose range is 180-200 mg/dl. (She presents today, accompanied by her husband, with her CGM showing at target fasting and above target postprandial glycemic profile.  Her POCT A1c today is 8.1%, essentially unchanged from previous visit.  She notes she has not been taking her Humalog insulin as prescribed due to hypoglycemia.  Analysis of her CGM shows TIR 43%, TAR 57%, TBR 0% with a GMI of 7.9%.  She has lost a few more pounds since last visit and generally feels well.) An ACE inhibitor/angiotensin II receptor blocker is being taken. She does not see a podiatrist.Eye exam is current.  Hypertension This is a chronic problem. The current episode started more than 1 year ago. The problem has been resolved since onset. The problem is controlled. Pertinent negatives include no sweats. Agents associated with hypertension include thyroid hormones. Risk factors for coronary artery disease include sedentary lifestyle, obesity, post-menopausal state, dyslipidemia, diabetes mellitus and family history. Past treatments include angiotensin blockers and diuretics. The current treatment provides moderate improvement. Compliance problems include diet and exercise.  Hypertensive end-organ damage includes kidney disease, CAD/MI  and CVA. Identifiable causes of hypertension include a thyroid problem.     Review of systems  Constitutional: + overall decreasing body weight, current Body mass index is 52.85 kg/m., + fatigue, no subjective hyperthermia, no subjective hypothermia Eyes: no blurry vision,  no xerophthalmia ENT: no sore throat, no nodules palpated in throat, no dysphagia/odynophagia, no hoarseness Cardiovascular: no chest pain, no shortness of breath, no palpitations, + leg swelling Respiratory: no cough, + shortness of breath with exertion Gastrointestinal: no nausea/vomiting/diarrhea Musculoskeletal: no muscle/joint aches, right side weakness from previous CVA, complains of right leg pain Skin: no rashes, no hyperemia Neurological: no tremors, + numbness to left arm and left leg, no dizziness Psychiatric: + significant depression, no anxiety  Objective:     BP 126/73 (BP Location: Left Arm, Patient Position: Sitting, Cuff Size: Large)   Pulse 69   Ht 5' (1.524 m)   Wt 270 lb 9.6 oz (122.7 kg)   BMI 52.85 kg/m   Wt Readings from Last 3 Encounters:  09/06/22 270 lb 9.6 oz (122.7 kg)  08/23/22 273 lb (123.8 kg)  07/03/22 274 lb (124.3 kg)     BP Readings from Last 3 Encounters:  09/06/22 126/73  08/23/22 117/73  07/03/22 138/72     Physical Exam- Limited  Constitutional:  Body mass index is 52.85 kg/m. , not in acute distress, normal state of mind Eyes:  EOMI, no exophthalmos Musculoskeletal: minimal right sided weakness- residual from previous stroke Skin:  no rashes, no hyperemia Neurological: no tremor with outstretched hands  Flowsheet Row Office Visit from 05/24/2022 in [facility]  PHQ-9 Total Score 15        CMP ( most recent) CMP     Component Value Date/Time   NA 142 03/06/2022 1136   K 5.5 (H) 03/06/2022 1136   CL 104 03/06/2022 1136   CO2 19 (L) 03/06/2022 1136   GLUCOSE 187 (H) 03/06/2022 1136   GLUCOSE 172 (H) 09/15/2020 1037   BUN 31  (H) 03/06/2022 1136   CREATININE 1.76 (H) 03/06/2022 1136   CALCIUM 9.4 03/06/2022 1136   PROT 6.8 03/06/2022 1136   ALBUMIN 4.0 03/06/2022 1136   AST 22 03/06/2022 1136   ALT 16 03/06/2022 1136   ALKPHOS 121 03/06/2022 1136   BILITOT 0.2 03/06/2022 1136   GFRNONAA 32 (L) 09/15/2020 1037   GFRAA 48 (L) 07/09/2017 1112     Diabetic Labs (most recent): Lab Results  Component Value Date   HGBA1C 8.0 (A) 05/08/2022   HGBA1C 9.1 (A) 02/02/2022   HGBA1C 10.5 11/02/2021   MICROALBUR 150 07/11/2021     Lipid Panel ( most recent) Lipid Panel     Component Value Date/Time   CHOL 113 03/06/2022 1136   TRIG 146 03/06/2022 1136   HDL 43 03/06/2022 1136   CHOLHDL 2.6 03/06/2022 1136   LDLCALC 45 03/06/2022 1136   LABVLDL 25 03/06/2022 1136      Lab Results  Component Value Date   TSH 3.880 04/17/2022   TSH 5.750 (H) 11/04/2021   TSH 1.533 09/29/2020   FREET4 1.50 04/17/2022   FREET4 1.06 11/04/2021           Assessment & Plan:   1) Type 2 diabetes mellitus with stage 3b chronic kidney disease, with long-term current use of insulin (HCC)  She presents today, accompanied by her husband, with her CGM showing at target fasting and above target postprandial glycemic profile.  Her POCT A1c today is 8.1%, essentially unchanged from previous visit.  She notes she has not been taking her Humalog insulin as prescribed due to hypoglycemia.  Analysis of her CGM shows TIR 43%, TAR 57%, TBR 0% with a GMI of 7.9%.  She has lost a few more  pounds since last visit and generally feels well.  - Jessica Strong has currently uncontrolled symptomatic type 2 DM since 77 years of age.   -Recent labs reviewed.  - I had a long discussion with her about the progressive nature of diabetes and the pathology behind its complications. -her diabetes is complicated by CVA, MI, CAD, OSA, CKD and she remains at a high risk for more acute and chronic complications which include CAD, CVA, CKD, retinopathy,  and neuropathy. These are all discussed in detail with her.  The following Lifestyle Medicine recommendations according to American College of Lifestyle Medicine [facility]) were discussed and offered to patient and she agrees to start the journey:  A. Whole Foods, Plant-based plate comprising of fruits and vegetables, plant-based proteins, whole-grain carbohydrates was discussed in detail with the patient.   A list for source of those nutrients were also provided to the patient.  Patient will use only water or unsweetened tea for hydration. B.  The need to stay away from risky substances including alcohol, smoking; obtaining 7 to 9 hours of restorative sleep, at least 150 minutes of moderate intensity exercise weekly, the importance of healthy social connections,  and stress reduction techniques were discussed. C.  A full color page of  Calorie density of various food groups per pound showing examples of each food groups was provided to the patient.  - Nutritional counseling repeated at each appointment due to patients tendency to fall back in to old habits.  - The patient admits there is a room for improvement in their diet and drink choices. -  Suggestion is made for the patient to avoid simple carbohydrates from their diet including Cakes, Sweet Desserts / Pastries, Ice Cream, Soda (diet and regular), Sweet Tea, Candies, Chips, Cookies, Sweet Pastries, Store Bought Juices, Alcohol in Excess of 1-2 drinks a day, Artificial Sweeteners, Coffee Creamer, and "Sugar-free" Products. This will help patient to have stable blood glucose profile and potentially avoid unintended weight gain.   - I encouraged the patient to switch to unprocessed or minimally processed complex starch and increased protein intake (animal or plant source), fruits, and vegetables.   - Patient is advised to stick to a routine mealtimes to eat 3 meals a day and avoid unnecessary snacks (to snack only to correct hypoglycemia).  - she  will be scheduled with Norm Salt, RDN, CDE for diabetes education.  - I have approached her with the following individualized plan to manage her diabetes and patient agrees:   -She is advised to continue her Tresiba 55 units SQ nightly, adjust her Humalog to 5-8 units TID with meals if glucose is above 90 and she is eating (Specific instructions on how to titrate insulin dosage based on glucose readings given to patient in writing) and continue Mounjaro 12.5 mg SQ weekly (will hold off on increasing due to medication shortage).   -she is encouraged to continue monitoring glucose 4 times daily (using her CGM), before meals and before bed, and to call the clinic if she has readings less than 70 or above 300 for 3 tests in a row.    - she is warned not to take insulin without proper monitoring per orders. - Adjustment parameters are given to her for hypo and hyperglycemia in writing.  - her Metformin was discontinued, risk outweighs benefit for this patient given recent renal decline.  - Specific targets for  A1c; LDL, HDL, and Triglycerides were discussed with the patient.  2) Blood Pressure /Hypertension:  her blood pressure is controlled to target for her age.   she is advised to continue her current medications including Benicar 20 mg po daily, Lasix 20 mg po daily as needed.  3) Lipids/Hyperlipidemia:    Her most recent lipid panel from 03/06/22 shows controlled LDL of 45.  She is advised to continue Zocor 20 mg po daily at bedtime.  Labs ordered by PCP.  4)  Weight/Diet:  her Body mass index is 52.85 kg/m.  -  clearly complicating her diabetes care.   she is a candidate for weight loss. I discussed with her the fact that loss of 5 - 10% of her  current body weight will have the most impact on her diabetes management.  Exercise, and detailed carbohydrates information provided  -  detailed on discharge instructions.  5) Hypothyroidism There are no recent TFTs to review.  She is advised  to continue Levothyroxine 75 mcg po daily before breakfast.  - The correct intake of thyroid hormone (Levothyroxine, Synthroid), is on empty stomach first thing in the morning, with water, separated by at least 30 minutes from breakfast and other medications,  and separated by more than 4 hours from calcium, iron, multivitamins, acid reflux medications (PPIs).  - This medication is a life-long medication and will be needed to correct thyroid hormone imbalances for the rest of your life.  The dose may change from time to time, based on thyroid blood work.  - It is extremely important to be consistent taking this medication, near the same time each morning.  -AVOID TAKING PRODUCTS CONTAINING BIOTIN (commonly found in Hair, Skin, Nails vitamins) AS IT INTERFERES WITH THE VALIDITY OF THYROID FUNCTION BLOOD TESTS.  6) Chronic Care/Health Maintenance: -she is on ACEI/ARB and not on Statin medications and is encouraged to initiate and continue to follow up with Ophthalmology, Dentist, Podiatrist at least yearly or according to recommendations, and advised to stay away from smoking. I have recommended yearly flu vaccine and pneumonia vaccine at least every 5 years; moderate intensity exercise for up to 150 minutes weekly; and sleep for at least 7 hours a day.  - she is advised to maintain close follow up with Gilmore Laroche, FNP for primary care needs, as well as her other providers for optimal and coordinated care.      I spent  35  minutes in the care of the patient today including review of labs from CMP, Lipids, Thyroid Function, Hematology (current and previous including abstractions from other facilities); face-to-face time discussing  her blood glucose readings/logs, discussing hypoglycemia and hyperglycemia episodes and symptoms, medications doses, her options of short and long term treatment based on the latest standards of care / guidelines;  discussion about incorporating lifestyle medicine;   and documenting the encounter. Risk reduction counseling performed per USPSTF guidelines to reduce obesity and cardiovascular risk factors.     Please refer to Patient Instructions for Blood Glucose Monitoring and Insulin/Medications Dosing Guide"  in media tab for additional information. Please  also refer to " Patient Self Inventory" in the Media  tab for reviewed elements of pertinent patient history.  Jessica Strong participated in the discussions, expressed understanding, and voiced agreement with the above plans.  All questions were answered to her satisfaction. she is encouraged to contact clinic should she have any questions or concerns prior to her return visit.     Follow up plan: - Return in about 3 months (around 12/07/2022) for Diabetes F/U with A1c in office, No  previsit labs, Bring meter and logs.   Ronny Bacon, [facility] [address] [place], [name] [address] Phone: [phone] Fax: [phone]  09/06/2022, 3:27 PM

## 2022-09-07 LAB — CBC WITH DIFFERENTIAL/PLATELET
Basophils Absolute: 0.1 10*3/uL (ref 0.0–0.2)
Basos: 1 %
EOS (ABSOLUTE): 0.3 10*3/uL (ref 0.0–0.4)
Eos: 3 %
Hematocrit: 35.7 % (ref 34.0–46.6)
Hemoglobin: 11.2 g/dL (ref 11.1–15.9)
Immature Grans (Abs): 0.1 10*3/uL (ref 0.0–0.1)
Immature Granulocytes: 1 %
Lymphocytes Absolute: 2.5 10*3/uL (ref 0.7–3.1)
Lymphs: 29 %
MCH: 25.5 pg — ABNORMAL LOW (ref 26.6–33.0)
MCHC: 31.4 g/dL — ABNORMAL LOW (ref 31.5–35.7)
MCV: 81 fL (ref 79–97)
Monocytes Absolute: 0.8 10*3/uL (ref 0.1–0.9)
Monocytes: 9 %
Neutrophils Absolute: 5.1 10*3/uL (ref 1.4–7.0)
Neutrophils: 57 %
Platelets: 298 10*3/uL (ref 150–450)
RBC: 4.4 x10E6/uL (ref 3.77–5.28)
RDW: 14.6 % (ref 11.7–15.4)
WBC: 8.8 10*3/uL (ref 3.4–10.8)

## 2022-09-07 LAB — CMP14+EGFR
ALT: 10 IU/L (ref 0–32)
AST: 15 IU/L (ref 0–40)
Albumin/Globulin Ratio: 1.5 (ref 1.2–2.2)
Albumin: 4.1 g/dL (ref 3.8–4.8)
Alkaline Phosphatase: 145 IU/L — ABNORMAL HIGH (ref 44–121)
BUN/Creatinine Ratio: 13 (ref 12–28)
BUN: 26 mg/dL (ref 8–27)
Bilirubin Total: 0.2 mg/dL (ref 0.0–1.2)
CO2: 20 mmol/L (ref 20–29)
Calcium: 9.3 mg/dL (ref 8.7–10.3)
Chloride: 104 mmol/L (ref 96–106)
Creatinine, Ser: 2.01 mg/dL — ABNORMAL HIGH (ref 0.57–1.00)
Globulin, Total: 2.7 g/dL (ref 1.5–4.5)
Glucose: 146 mg/dL — ABNORMAL HIGH (ref 70–99)
Potassium: 5 mmol/L (ref 3.5–5.2)
Sodium: 141 mmol/L (ref 134–144)
Total Protein: 6.8 g/dL (ref 6.0–8.5)
eGFR: 25 mL/min/{1.73_m2} — ABNORMAL LOW (ref >59)

## 2022-09-07 LAB — LIPID PANEL
Chol/HDL Ratio: 2.8 ratio (ref 0.0–4.4)
Cholesterol, Total: 125 mg/dL (ref 100–199)
HDL: 45 mg/dL (ref >39)
LDL Chol Calc (NIH): 52 mg/dL (ref 0–99)
Triglycerides: 164 mg/dL — ABNORMAL HIGH (ref 0–149)
VLDL Cholesterol Cal: 28 mg/dL (ref 5–40)

## 2022-09-07 LAB — TSH+FREE T4
Free T4: 1.23 ng/dL (ref 0.82–1.77)
TSH: 3.33 u[IU]/mL (ref 0.450–4.500)

## 2022-09-10 NOTE — Progress Notes (Signed)
Please inform the patient that her triglyceride is slightly elevated.  I recommend decreasing her intake of greasy fatty foods with increased physical activity.  I also recommend that she takes over-the-counter fish oil 2000 mg twice daily.  Her kidney function has declined from 30 to 25.  Please follow-up on referral placed to nephrology.  Encouraged the patient increase her fluid intake, at least 64 ounces daily.

## 2022-09-11 ENCOUNTER — Encounter (HOSPITAL_COMMUNITY): Payer: Self-pay

## 2022-09-11 ENCOUNTER — Ambulatory Visit (HOSPITAL_COMMUNITY)
Admission: RE | Admit: 2022-09-11 | Discharge: 2022-09-11 | Disposition: A | Discharge status: Home / Self Care | Payer: Medicare Other | Source: Ambulatory Visit | Attending: Family Medicine | Admitting: Family Medicine

## 2022-09-11 DIAGNOSIS — Z1231 Encounter for screening mammogram for malignant neoplasm of breast: Secondary | ICD-10-CM | POA: Diagnosis present

## 2022-09-19 ENCOUNTER — Other Ambulatory Visit: Payer: Self-pay | Admitting: Cardiology

## 2022-10-16 ENCOUNTER — Encounter (INDEPENDENT_AMBULATORY_CARE_PROVIDER_SITE_OTHER): Payer: Self-pay | Admitting: Gastroenterology

## 2022-10-16 ENCOUNTER — Ambulatory Visit (INDEPENDENT_AMBULATORY_CARE_PROVIDER_SITE_OTHER): Payer: Medicare Other | Admitting: Gastroenterology

## 2022-10-16 VITALS — BP 111/75 | HR 72 | Temp 97.9°F | Ht 60.0 in | Wt 270.0 lb

## 2022-10-16 DIAGNOSIS — R10811 Right upper quadrant abdominal tenderness: Secondary | ICD-10-CM | POA: Insufficient documentation

## 2022-10-16 DIAGNOSIS — R1012 Left upper quadrant pain: Secondary | ICD-10-CM | POA: Diagnosis not present

## 2022-10-16 DIAGNOSIS — K5900 Constipation, unspecified: Secondary | ICD-10-CM | POA: Diagnosis not present

## 2022-10-16 NOTE — Patient Instructions (Signed)
We will get you scheduled for an ultrasound of your abdomen to further evaluate your abdominal pain  Continue with dulcolax and good water intake  Follow up 6 months

## 2022-10-16 NOTE — Progress Notes (Addendum)
Referring Provider: Gilmore Laroche, FNP Primary Care Physician:  Gilmore Laroche, FNP Primary GI Physician: Levon Hedger   Chief Complaint  Patient presents with   Constipation    Follow up on constipation. Has BM daily. Takes dulcolax daily.    Abdominal Pain    Having pain in upper abdomen. Has sharp pain that comes and goes. Has swelling and thinks she has something going on with hernia.    HPI:   Jessica Strong is a 77 y.o. female with past medical history of obesity status post Roux-en-Y gastric bypass, anxiety, depression, diabetes, hyperlipidemia, hypertension, myocardial infarction, stroke   Patient presenting today for follow up of constipation and abdominal pain.   Last seen December 2023, at that time,  taking dulcolax every other day and is having a BM daily. did not want to try anything else for the constipation. She is drinking quite a bit of water per day. may have a rare episode of n/v once every 6 months that will resolve thereafter. Feels that BMs are a sufficient volume when she does go to the restroom. She has some mid/left upper quadrant pain that occurs maybe once per week, no precipitating factors, can be moving around or sitting. Pain will last a few minutes then resolve on its own. Does not feel that eating affects this. does not really have an appetite, is maybe eating some less, feels somewhat full all the time. Weight is down to 272 lbs today from 281 in October.  Recommended to continue dulcolax, good water intake, pt to make me aware of worsening abdominal pain(queried functional)  Present:  Reports Constipation is doing well with dulcolax daily, having a BM daily without the need to strain, no rectal bleeding or melena.   She has continued Left abdominal pain, mid to upper. This has been chronic for her for the past few years, though per chart review, appears she initially complained of RUQ pain.  She notes that pain is worse prior to eating, though states  that pain is not improved with eating? No nausea or vomiting. No heartburn or acid reflux symptoms. Appetite is not good. She notes that she gets bloated and feels that her abdomen gets hard, feels full quickly when she eats. Denies flatulence. Weight has fluctuated between 263 and 281 since March of last year, she is 270 lbs today.   CT A/P w/o contrast: July 21, 2021 Hepatic steatosis.  Small nonobstructive left renal calculus.  Sigmoid diverticulosis without inflammation. EGD:09/17/20- Normal esophagus. - Gastric mucosal atrophy. Biopsied-H pylori positive (neg stool testing in aug 2022) - Normal examined duodenum. Biopsied-normal Last Colonoscopy: 09/17/20- A single non-bleeding colonic angiodysplastic lesion. - One 2 mm polyp in the ascending colon-tubular adenoma - Diverticulosis in the sigmoid colon and in the descending colon. - Non-bleeding internal hemorrhoids.   REPEAT COLONOSCOPY IN 5 YEARS  Past Medical History:  Diagnosis Date   Allergy    Anemia    Anxiety    Arthritis    Cataract    cervical ca 1990   Depression    Diabetes (HCC)    Hypercholesterolemia    Hypertension    Myocardial infarction [facility])    Sleep apnea    on CPAP 14 mm PS since February 2017   Stroke [facility])    weakness on R side   Vitamin D deficiency     Past Surgical History:  Procedure Laterality Date   ABDOMINAL HYSTERECTOMY     BARIATRIC SURGERY     BIOPSY  09/17/2020   Procedure: BIOPSY;  Surgeon: Dolores Frame, MD;  Location: [facility];  Service: Gastroenterology;;   BREAST BIOPSY Right    beningn   CARDIAC CATHETERIZATION N/A 12/24/2015   Procedure: Right/Left Heart Cath and Coronary Angiography;  Surgeon: Corky Crafts, MD;  Location: [facility] INVASIVE CV LAB;  Service: Cardiovascular;  Laterality: N/A;   COLONOSCOPY  2018   COLONOSCOPY WITH PROPOFOL N/A 09/17/2020   Procedure: COLONOSCOPY WITH PROPOFOL;  Surgeon: Dolores Frame, MD;  Location: [facility];   Service: Gastroenterology;  Laterality: N/A;  9:00   ESOPHAGOGASTRODUODENOSCOPY (EGD) WITH PROPOFOL N/A 09/17/2020   Procedure: ESOPHAGOGASTRODUODENOSCOPY (EGD) WITH PROPOFOL;  Surgeon: Dolores Frame, MD;  Location: [facility];  Service: Gastroenterology;  Laterality: N/A;   HERNIA REPAIR     LEFT HEART CATH AND CORONARY ANGIOGRAPHY N/A 07/12/2017   Procedure: LEFT HEART CATH AND CORONARY ANGIOGRAPHY;  Surgeon: Lyn Records, MD;  Location: [facility] INVASIVE CV LAB;  Service: Cardiovascular;  Laterality: N/A;    Current Outpatient Medications  Medication Sig Dispense Refill   ACCU-CHEK AVIVA PLUS test strip Inject 1 strip as directed daily.  4   acetaminophen (TYLENOL) 500 MG tablet Take 500 mg by mouth every 6 (six) hours as needed for mild pain or headache. As needed.     albuterol (VENTOLIN HFA) 108 (90 Base) MCG/ACT inhaler Inhale 2 puffs into the lungs every 6 (six) hours as needed for wheezing or shortness of breath. 8 g 2   aspirin 325 MG tablet Take 325 mg by mouth daily.     bisacodyl (DULCOLAX) 5 MG EC tablet Take 5 mg by mouth. One every other day     Calcium Carb-Cholecalciferol (CALCIUM 600-D PO) Take 1 tablet by mouth daily.     EPINEPHrine 0.3 mg/0.3 mL IJ SOAJ injection Inject 0.3 mg into the muscle as needed for anaphylaxis.     ferrous sulfate 325 (65 FE) MG tablet Take 325 mg by mouth daily with breakfast.     furosemide (LASIX) 20 MG tablet TAKE 1 TABLET BY MOUTH DAILY.  (MAY TAKE EXTRA 1 TABLET AS  NEEDED FOR SEVERE SWELLING) 167 tablet 3   HUMALOG KWIKPEN 100 UNIT/ML KwikPen Inject 5-8 Units into the skin 3 (three) times daily. 30 mL 3   Lancets Thin MISC Inject 1 Stick into the skin daily.     levothyroxine (SYNTHROID) 75 MCG tablet Take 1 tablet (75 mcg total) by mouth daily before breakfast. 90 tablet 1   Multiple Vitamins-Minerals (MULTIVITAMIN ADULT) CHEW Chew 1 tablet by mouth daily at 12 noon.     NOVOFINE PEN NEEDLE 32G X 6 MM MISC Use to inject insulin  4 times daily 200 each 2   olmesartan (BENICAR) 20 MG tablet Take 1 tablet (20 mg total) by mouth daily. 90 tablet 1   potassium chloride (KLOR-CON M) 10 MEQ tablet Take 1 tablet (10 mEq total) by mouth daily. 60 tablet 5   simvastatin (ZOCOR) 20 MG tablet Take 1 tablet (20 mg total) by mouth at bedtime. 90 tablet 0   terbinafine (LAMISIL AT) 1 % cream Apply 1 Application topically 2 (two) times daily. 30 g 0   tirzepatide (MOUNJARO) 12.5 MG/0.5ML Pen Inject 12.5 mg into the skin once a week. 6 mL 1   TRESIBA FLEXTOUCH 200 UNIT/ML FlexTouch Pen Inject 56 Units into the skin at bedtime. 45 mL 3   lubiprostone (AMITIZA) 8 MCG capsule Take 1 capsule (8 mcg total) by mouth  2 (two) times daily with a meal. (Patient not taking: Reported on 10/16/2022) 60 capsule 1   No current facility-administered medications for this visit.    Allergies as of 10/16/2022 - Review Complete 10/16/2022  Allergen Reaction Noted   Corn oil Rash 12/26/2017   Other Rash 12/22/2015   Peanut allergen powder-dnfp Rash 12/26/2017   Peanut-containing drug products Rash 12/22/2015   Penicillins Rash     Family History  Problem Relation Age of Onset   Cancer Mother    Colon cancer Mother    Diabetes Mother    Hypertension Father    Thyroid disease Father    Thyroid cancer Father    Diabetes Father    Esophageal cancer Father    Breast cancer Maternal Aunt    Colon cancer Maternal Aunt    Breast cancer Maternal Aunt    Colon cancer Maternal Grandmother    Stroke Maternal Grandfather    Stomach cancer Paternal Grandfather    Colon cancer Son    Colon polyps Neg Hx    Rectal cancer Neg Hx     Social History   Socioeconomic History   Marital status: Married    Spouse name: Not on file   Number of children: 2   Years of education: Not on file   Highest education level: Not on file  Occupational History   Occupation: retired  Tobacco Use   Smoking status: Never    Passive exposure: Never   Smokeless  tobacco: Never  Vaping Use   Vaping Use: Never used  Substance and Sexual Activity   Alcohol use: Not Currently    Comment: occasionally   Drug use: No   Sexual activity: Never    Partners: Male  Other Topics Concern   Not on file  Social History Narrative   Not on file   Social Determinants of Health   Financial Resource Strain: Not on file  Food Insecurity: Not on file  Transportation Needs: Not on file  Physical Activity: Not on file  Stress: Not on file  Social Connections: Not on file    Review of systems General: negative for malaise, night sweats, fever, chills, weight loss Neck: Negative for lumps, goiter, pain and significant neck swelling Resp: Negative for cough, wheezing, dyspnea at rest CV: Negative for chest pain, leg swelling, palpitations, orthopnea GI: denies melena, hematochezia, nausea, vomiting, diarrhea, constipation, dysphagia, odyonophagia, early satiety or unintentional weight loss. +decreased appetite +LUQ pain  MSK: Negative for joint pain or swelling, back pain, and muscle pain. Derm: Negative for itching or rash Psych: Denies depression, anxiety, memory loss, confusion. No homicidal or suicidal ideation.  Heme: Negative for prolonged bleeding, bruising easily, and swollen nodes. Endocrine: Negative for cold or heat intolerance, polyuria, polydipsia and goiter. Neuro: negative for tremor, gait imbalance, syncope and seizures. The remainder of the review of systems is noncontributory.  Physical Exam: BP 111/75 (BP Location: Left Arm, Patient Position: Sitting, Cuff Size: Large)   Pulse 72   Temp 97.9 F (36.6 C) (Oral)   Ht 5' (1.524 m)   Wt 270 lb (122.5 kg)   BMI 52.73 kg/m  General:   Alert and oriented. No distress noted. Pleasant and cooperative.  Head:  Normocephalic and atraumatic. Eyes:  Conjuctiva clear without scleral icterus. Mouth:  Oral mucosa pink and moist. Good dentition. No lesions. Heart: Normal rate and rhythm, s1 and s2  heart sounds present.  Lungs: Clear lung sounds in all lobes. Respirations equal and unlabored. Abdomen:  +  BS, soft, and non-distended. TTP of RUQ. No rebound or guarding. No HSM or masses noted. Derm: No palmar erythema or jaundice Msk:  Symmetrical without gross deformities. Normal posture. Extremities:  Without edema. Neurologic:  Alert and  oriented x4 Psych:  Alert and cooperative. Normal mood and affect.  Invalid input(s): "6 MONTHS"   ASSESSMENT: Jessica Strong is a 77 y.o. female presenting today for follow up of constipation and Abdominal pain.   Constipation well controlled with dulcolax, having a BM daily without the need to strain, no rectal bleeding or melena. Will continue with current regimen, good water intake  Patient with continued left mid to upper abdominal pain, nothing this has been chronic for her for the past few years, though per chart review, appears she initially complained of RUQ pain.  No nausea or vomiting, no overt weight loss. Pain worse prior to eating though she denies improvement of pain with eating? She had upper endoscopy in 2022 after onset of her pain with findings of H pylori for which she was treated for, CT done in march 2023 without findings to explain her pain. Query if there is some aspect of functional abdominal pain present here. Notably on exam today she has tenderness of RUQ. GB is in situ. Will obtain Ultrasound of the abdomen for further evaluation of her RUQ pain, this may also help to assess her LUQ pain as well. May consider repeating EGD if pain persists and [place] is unremarkable.    PLAN:  Continue with dulcolax, good water intake  2. Abdominal US complete  3. Consider EGD if pain persist/US unremarkable   All questions were answered, patient verbalized understanding and is in agreement with plan as outlined above.    Follow Up: 6 months   Josefine Fuhr L. Jeanmarie Hubert, MSN, APRN, AGNP-C Adult-Gerontology Nurse Practitioner [facility]  I have reviewed the note and agree with the APP's assessment as described in this progress note  Katrinka Blazing, MD Gastroenterology and Hepatology [facility] Gastroenterology

## 2022-10-17 ENCOUNTER — Other Ambulatory Visit: Payer: Self-pay | Admitting: Family Medicine

## 2022-10-26 ENCOUNTER — Ambulatory Visit (HOSPITAL_COMMUNITY): Payer: Medicare Other

## 2022-10-26 ENCOUNTER — Other Ambulatory Visit: Payer: Self-pay | Admitting: Family Medicine

## 2022-10-26 DIAGNOSIS — E7849 Other hyperlipidemia: Secondary | ICD-10-CM

## 2022-10-30 ENCOUNTER — Ambulatory Visit (HOSPITAL_COMMUNITY)
Admission: RE | Admit: 2022-10-30 | Discharge: 2022-10-30 | Disposition: A | Discharge status: Home / Self Care | Payer: Medicare Other | Source: Ambulatory Visit | Attending: Gastroenterology | Admitting: Gastroenterology

## 2022-10-30 DIAGNOSIS — R10811 Right upper quadrant abdominal tenderness: Secondary | ICD-10-CM | POA: Diagnosis present

## 2022-10-30 DIAGNOSIS — R1012 Left upper quadrant pain: Secondary | ICD-10-CM | POA: Diagnosis present

## 2022-11-03 ENCOUNTER — Encounter (INDEPENDENT_AMBULATORY_CARE_PROVIDER_SITE_OTHER): Payer: Self-pay

## 2022-11-08 ENCOUNTER — Encounter (INDEPENDENT_AMBULATORY_CARE_PROVIDER_SITE_OTHER): Payer: Self-pay

## 2022-11-09 ENCOUNTER — Telehealth (INDEPENDENT_AMBULATORY_CARE_PROVIDER_SITE_OTHER): Payer: Self-pay | Admitting: Gastroenterology

## 2022-11-09 ENCOUNTER — Telehealth: Payer: Self-pay | Admitting: Family Medicine

## 2022-11-09 DIAGNOSIS — Z0279 Encounter for issue of other medical certificate: Secondary | ICD-10-CM

## 2022-11-09 NOTE — Telephone Encounter (Signed)
Pt contacted with pre op appt info Pre op:11/16/22@1 :45pm Daughter will be coming by to pick up instructions for EGD scheduled on 11/17/22

## 2022-11-09 NOTE — Telephone Encounter (Signed)
FMLA  (daughter to help mother )  Copied Noted Sleeved (put in provider box, forms to expire 07.26.2024)  Call patient daughter when ready [phone]

## 2022-11-14 NOTE — Telephone Encounter (Signed)
Per nurse contacted the patient daughter she will pick up the forms. Forms were faxed and a copy was put in yosseline box for charges.

## 2022-11-15 NOTE — Telephone Encounter (Signed)
Daughter picked up forms

## 2022-11-15 NOTE — Patient Instructions (Signed)
[name] Jessica Strong  11/15/2022     @PREFPERIOPPHARMACY @   Your procedure is scheduled on  11/17/2022.   [facility] to [facility] at  1235 P.M.   Call this number if you have problems the morning of surgery:  [phone]  If you experience any cold or flu symptoms such as cough, fever, chills, shortness of breath, etc. between now and your scheduled surgery, please notify us at the above number.   Remember:  Follow the diet instructions given to you by the office.       Your last dose of mounjaro should have been on 11/09/2022.       Take 28 units of your tresiba the night before your procedure.     DO NOT take any medications for diabetes the morning of your procedure.      Use your inhaler before you come and bring your rescue inhaler with you.      Take these medicines the morning of surgery with A SIP OF WATER                                               levothyroxine.    Do not wear jewelry, make-up or nail polish, including gel polish,  artificial nails, or any other type of covering on natural nails (fingers and  toes).  Do not wear lotions, powders, or perfumes, or deodorant.  Do not shave 48 hours prior to surgery.  Men may shave face and neck.  Do not bring valuables to the hospital.  [facility] is not responsible for any belongings or valuables.  Contacts, dentures or bridgework may not be worn into surgery.  Leave your suitcase in the car.  After surgery it may be brought to your room.  For patients admitted to the hospital, discharge time will be determined by your treatment team.  Patients discharged the day of surgery will not be allowed to drive home and must have someone with them for 24 hours.    Special instructions:   DO NOT smoke tobacco or vape for 24 hours before your procedure.  Please read over the following fact sheets that you were given. Anesthesia Post-op Instructions and Care and Recovery After Surgery      Upper Endoscopy,  Adult, Care After After the procedure, it is common to have a sore throat. It is also common to have: Mild stomach pain or discomfort. Bloating. Nausea. Follow these instructions at home: The instructions below may help you care for yourself at home. Your health care provider may give you more instructions. If you have questions, ask your health care provider. If you were given a sedative during the procedure, it can affect you for several hours. Do not drive or operate machinery until your health care provider says that it is safe. If you will be going home right after the procedure, plan to have a responsible adult: Take you home from the hospital or clinic. You will not be allowed to drive. Care for you for the time you are told. Follow instructions from your health care provider about what you may eat and drink. Return to your normal activities as told by your health care provider. Ask your health care provider what activities are safe for you. Take over-the-counter and prescription medicines only as told by your health care provider. Contact  a health care provider if you: Have a sore throat that lasts longer than one day. Have trouble swallowing. Have a fever. Get help right away if you: Vomit blood or your vomit looks like coffee grounds. Have bloody, black, or tarry stools. Have a very bad sore throat or you cannot swallow. Have difficulty breathing or very bad pain in your chest or abdomen. These symptoms may be an emergency. Get help right away. Call 911. Do not wait to see if the symptoms will go away. Do not drive yourself to the hospital. Summary After the procedure, it is common to have a sore throat, mild stomach discomfort, bloating, and nausea. If you were given a sedative during the procedure, it can affect you for several hours. Do not drive until your health care provider says that it is safe. Follow instructions from your health care provider about what you may eat  and drink. Return to your normal activities as told by your health care provider. This information is not intended to replace advice given to you by your health care provider. Make sure you discuss any questions you have with your health care provider. Document Revised: 07/27/2021 Document Reviewed: 07/27/2021 Elsevier Patient Education  2024 Else[employer] Monitored Anesthesia Care, Care After The following information offers guidance on how to care for yourself after your procedure. Your health care provider may also give you more specific instructions. If you have problems or questions, contact your health care provider. What can I expect after the procedure? After the procedure, it is common to have: Tiredness. Little or no memory about what happened during or after the procedure. Impaired judgment when it comes to making decisions. Nausea or vomiting. Some trouble with balance. Follow these instructions at home: For the time period you were told by your health care provider:  Rest. Do not participate in activities where you could fall or become injured. Do not drive or use machinery. Do not drink alcohol. Do not take sleeping pills or medicines that cause drowsiness. Do not make important decisions or sign legal documents. Do not take care of children on your own. Medicines Take over-the-counter and prescription medicines only as told by your health care provider. If you were prescribed antibiotics, take them as told by your health care provider. Do not stop using the antibiotic even if you start to feel better. Eating and drinking Follow instructions from your health care provider about what you may eat and drink. Drink enough fluid to keep your urine pale yellow. If you vomit: Drink clear fluids slowly and in small amounts as you are able. Clear fluids include water, ice chips, low-calorie sports drinks, and fruit juice that has water added to it (diluted fruit juice). Eat  light and bland foods in small amounts as you are able. These foods include bananas, applesauce, rice, lean meats, toast, and crackers. General instructions  Have a responsible adult stay with you for the time you are told. It is important to have someone help care for you until you are awake and alert. If you have sleep apnea, surgery and some medicines can increase your risk for breathing problems. Follow instructions from your health care provider about wearing your sleep device: When you are sleeping. This includes during daytime naps. While taking prescription pain medicines, sleeping medicines, or medicines that make you drowsy. Do not use any products that contain nicotine or tobacco. These products include cigarettes, chewing tobacco, and vaping devices, such as e-cigarettes. If you need help quitting,  ask your health care provider. Contact a health care provider if: You feel nauseous or vomit every time you eat or drink. You feel light-headed. You are still sleepy or having trouble with balance after 24 hours. You get a rash. You have a fever. You have redness or swelling around the IV site. Get help right away if: You have trouble breathing. You have new confusion after you get home. These symptoms may be an emergency. Get help right away. Call 911. Do not wait to see if the symptoms will go away. Do not drive yourself to the hospital. This information is not intended to replace advice given to you by your health care provider. Make sure you discuss any questions you have with your health care provider. Document Revised: 09/12/2021 Document Reviewed: 09/12/2021 Elsevier Patient Education  2024 [employer].

## 2022-11-16 ENCOUNTER — Encounter (HOSPITAL_COMMUNITY): Payer: Self-pay

## 2022-11-16 ENCOUNTER — Encounter (HOSPITAL_COMMUNITY): Payer: Self-pay | Admitting: Anesthesiology

## 2022-11-16 ENCOUNTER — Encounter (HOSPITAL_COMMUNITY)
Admission: RE | Admit: 2022-11-16 | Discharge: 2022-11-16 | Disposition: A | Discharge status: Home / Self Care | Payer: Medicare Other | Source: Ambulatory Visit | Attending: Gastroenterology | Admitting: Gastroenterology

## 2022-11-16 VITALS — BP 143/58 | HR 69 | Temp 97.8°F | Resp 18 | Ht 60.0 in | Wt 270.1 lb

## 2022-11-16 DIAGNOSIS — Z794 Long term (current) use of insulin: Secondary | ICD-10-CM | POA: Diagnosis not present

## 2022-11-16 DIAGNOSIS — E1101 Type 2 diabetes mellitus with hyperosmolarity with coma: Secondary | ICD-10-CM | POA: Insufficient documentation

## 2022-11-16 DIAGNOSIS — I10 Essential (primary) hypertension: Secondary | ICD-10-CM | POA: Diagnosis not present

## 2022-11-16 DIAGNOSIS — Z01810 Encounter for preprocedural cardiovascular examination: Secondary | ICD-10-CM | POA: Insufficient documentation

## 2022-11-16 HISTORY — DX: Hypothyroidism, unspecified: E03.9

## 2022-11-16 HISTORY — DX: Unspecified asthma, uncomplicated: J45.909

## 2022-11-16 HISTORY — DX: Dyspnea, unspecified: R06.00

## 2022-11-17 DIAGNOSIS — R10811 Right upper quadrant abdominal tenderness: Secondary | ICD-10-CM

## 2022-11-20 ENCOUNTER — Encounter (INDEPENDENT_AMBULATORY_CARE_PROVIDER_SITE_OTHER): Payer: Self-pay

## 2022-11-20 NOTE — Telephone Encounter (Signed)
Forms faxed with confirmation. Charge fee applied.

## 2022-11-27 ENCOUNTER — Encounter: Payer: Self-pay | Admitting: Family Medicine

## 2022-11-27 ENCOUNTER — Ambulatory Visit (INDEPENDENT_AMBULATORY_CARE_PROVIDER_SITE_OTHER): Payer: Medicare Other | Admitting: Family Medicine

## 2022-11-27 VITALS — BP 138/86 | HR 73 | Ht 60.0 in | Wt 275.0 lb

## 2022-11-27 DIAGNOSIS — I10 Essential (primary) hypertension: Secondary | ICD-10-CM | POA: Diagnosis not present

## 2022-11-27 DIAGNOSIS — E038 Other specified hypothyroidism: Secondary | ICD-10-CM

## 2022-11-27 DIAGNOSIS — E7841 Elevated Lipoprotein(a): Secondary | ICD-10-CM | POA: Diagnosis not present

## 2022-11-27 DIAGNOSIS — E1101 Type 2 diabetes mellitus with hyperosmolarity with coma: Secondary | ICD-10-CM

## 2022-11-27 DIAGNOSIS — Z794 Long term (current) use of insulin: Secondary | ICD-10-CM

## 2022-11-27 MED ORDER — NOVOFINE PEN NEEDLE 32G X 6 MM MISC
2 refills | Status: DC
Start: 2022-11-27 — End: 2024-03-04

## 2022-11-27 NOTE — Patient Instructions (Addendum)
I appreciate the opportunity to provide care to you today!    Follow up:  3 months  Labs: next  Schedule medicare annual wellness visit  Nonpharmacologic management of anxiety and depression  Mindfulness and Meditation Practices like mindfulness meditation can help reduce symptoms by promoting relaxation and present-moment awareness.  Exercise  Regular physical activity has been shown to improve mood and reduce anxiety through the release of endorphins and other neurochemicals.  Healthy Diet Eating a balanced diet rich in fruits, vegetables, whole grains, and lean proteins can support overall mental health.  Sleep Hygiene  Establishing a regular sleep routine and ensuring good sleep quality can significantly impact mood and anxiety levels.  Stress Management Techniques Activities such as yoga, tai chi, and deep breathing exercises can help manage stress.  Social Support Maintaining strong relationships and seeking support from friends, family, or support groups can provide emotional comfort and reduce feelings of isolation.  Lifestyle Modifications Reducing alcohol and caffeine intake, quitting smoking, and avoiding recreational drugs can improve symptoms.  Art and Music Therapy Engaging in creative activities like painting, drawing, or playing music can be therapeutic and help express emotions.  Light Therapy Particularly useful for seasonal affective disorder (SAD), exposure to bright light can help regulate mood. .   Please continue to a heart-healthy diet and increase your physical activities. Try to exercise for at least five days a week.    It was a pleasure to see you and I look forward to continuing to work together on your health and well-being. Please do not hesitate to call the office if you need care or have questions about your care.  In case of emergency, please visit the Emergency Department for urgent care, or contact our clinic at [phone] to schedule  an appointment. We're here to help you!   Have a wonderful day and week. With Gratitude, Gilmore Laroche MSN, FNP-BC

## 2022-11-27 NOTE — Progress Notes (Signed)
Established Patient Office Visit  Subjective:  Patient ID: Jessica Strong, female    DOB: 12/04/1945  Age: 77 y.o. MRN: [id number]  CC:  Chief Complaint  Patient presents with   Care Management    3 month f/u, pt reports slightly eating and gaining weight.      Jessica Strong is a 77 y.o. female with past medical history of T2DM, HTN and HLP presents for f/u of  chronic medical conditions. For the details of today's visit, please refer to the assessment and plan.      Past Medical History:  Diagnosis Date   Allergy    Anemia    Anxiety    Arthritis    Asthma    Cataract    cervical ca 1990   Depression    Diabetes (HCC)    Dyspnea    Hypercholesterolemia    Hypertension    Hypothyroidism    Myocardial infarction (HCC) 2005   Sleep apnea    on CPAP 14 mm PS since February 2017   Stroke [facility]) 2007   weakness on R side   Vitamin D deficiency     Past Surgical History:  Procedure Laterality Date   ABDOMINAL HYSTERECTOMY     BARIATRIC SURGERY     BIOPSY  09/17/2020   Procedure: BIOPSY;  Surgeon: Dolores Frame, MD;  Location: [facility];  Service: Gastroenterology;;   BREAST BIOPSY Right    beningn   CARDIAC CATHETERIZATION N/A 12/24/2015   Procedure: Right/Left Heart Cath and Coronary Angiography;  Surgeon: Corky Crafts, MD;  Location: [facility] INVASIVE CV LAB;  Service: Cardiovascular;  Laterality: N/A;   COLONOSCOPY  2018   COLONOSCOPY WITH PROPOFOL N/A 09/17/2020   Procedure: COLONOSCOPY WITH PROPOFOL;  Surgeon: Dolores Frame, MD;  Location: [facility];  Service: Gastroenterology;  Laterality: N/A;  9:00   ESOPHAGOGASTRODUODENOSCOPY (EGD) WITH PROPOFOL N/A 09/17/2020   Procedure: ESOPHAGOGASTRODUODENOSCOPY (EGD) WITH PROPOFOL;  Surgeon: Dolores Frame, MD;  Location: [facility];  Service: Gastroenterology;  Laterality: N/A;   HERNIA REPAIR     LEFT HEART CATH AND CORONARY ANGIOGRAPHY N/A 07/12/2017   Procedure:  LEFT HEART CATH AND CORONARY ANGIOGRAPHY;  Surgeon: Lyn Records, MD;  Location: [facility] INVASIVE CV LAB;  Service: Cardiovascular;  Laterality: N/A;    Family History  Problem Relation Age of Onset   Cancer Mother    Colon cancer Mother    Diabetes Mother    Hypertension Father    Thyroid disease Father    Thyroid cancer Father    Diabetes Father    Esophageal cancer Father    Breast cancer Maternal Aunt    Colon cancer Maternal Aunt    Breast cancer Maternal Aunt    Colon cancer Maternal Grandmother    Stroke Maternal Grandfather    Stomach cancer Paternal Grandfather    Colon cancer Son    Colon polyps Neg Hx    Rectal cancer Neg Hx     Social History   Socioeconomic History   Marital status: Married    Spouse name: Not on file   Number of children: 2   Years of education: Not on file   Highest education level: Not on file  Occupational History   Occupation: retired  Tobacco Use   Smoking status: Never    Passive exposure: Never   Smokeless tobacco: Never  Vaping Use   Vaping status: Never Used  Substance and Sexual Activity   Alcohol  use: Not Currently    Comment: occasionally   Drug use: No   Sexual activity: Never    Partners: Male  Other Topics Concern   Not on file  Social History Narrative   Not on file   Social Determinants of Health   Financial Resource Strain: Low Risk  (11/30/2022)   Overall Financial Resource Strain (CARDIA)    Difficulty of Paying Living Expenses: Not hard at all  Food Insecurity: No Food Insecurity (11/30/2022)   Hunger Vital Sign    Worried About Running Out of Food in the Last Year: Never true    Ran Out of Food in the Last Year: Never true  Transportation Needs: No Transportation Needs (11/30/2022)   PRAPARE - Administrator, Civil Service (Medical): No    Lack of Transportation (Non-Medical): No  Physical Activity: Inactive (11/30/2022)   Exercise Vital Sign    Days of Exercise per Week: 0 days    Minutes of  Exercise per Session: 0 min  Stress: No Stress Concern Present (11/30/2022)   [employer] of Occupational Health - Occupational Stress Questionnaire    Feeling of Stress : Not at all  Social Connections: Moderately Isolated (11/30/2022)   Social Connection and Isolation Panel [NHANES]    Frequency of Communication with Friends and Family: More than three times a week    Frequency of Social Gatherings with Friends and Family: More than three times a week    Attends Religious Services: Never    Database administrator or Organizations: No    Attends Banker Meetings: Never    Marital Status: Married  Catering manager Violence: Not At Risk (11/30/2022)   Humiliation, Afraid, Rape, and Kick questionnaire    Fear of Current or Ex-Partner: No    Emotionally Abused: No    Physically Abused: No    Sexually Abused: No    Outpatient Medications Prior to Visit  Medication Sig Dispense Refill   ACCU-CHEK AVIVA PLUS test strip Inject 1 strip as directed daily.  4   acetaminophen (TYLENOL) 500 MG tablet Take 500 mg by mouth every 6 (six) hours as needed for mild pain or headache. As needed.     albuterol (VENTOLIN HFA) 108 (90 Base) MCG/ACT inhaler Inhale 2 puffs into the lungs every 6 (six) hours as needed for wheezing or shortness of breath. 8 g 2   aspirin 325 MG tablet Take 325 mg by mouth daily.     bisacodyl (DULCOLAX) 5 MG EC tablet Take 5 mg by mouth. One every other day     Calcium Carb-Cholecalciferol (CALCIUM 600-D PO) Take 1 tablet by mouth daily.     EPINEPHrine 0.3 mg/0.3 mL IJ SOAJ injection Inject 0.3 mg into the muscle as needed for anaphylaxis.     ferrous sulfate 325 (65 FE) MG tablet Take 325 mg by mouth daily with breakfast.     furosemide (LASIX) 20 MG tablet TAKE 1 TABLET BY MOUTH DAILY.  (MAY TAKE EXTRA 1 TABLET AS  NEEDED FOR SEVERE SWELLING) 167 tablet 3   HUMALOG KWIKPEN 100 UNIT/ML KwikPen Inject 5-8 Units into the skin 3 (three) times daily. 30 mL 3    Lancets Thin MISC Inject 1 Stick into the skin daily.     levothyroxine (SYNTHROID) 75 MCG tablet Take 1 tablet (75 mcg total) by mouth daily before breakfast. 90 tablet 1   lubiprostone (AMITIZA) 8 MCG capsule Take 1 capsule (8 mcg total) by mouth 2 (two) times  daily with a meal. (Patient not taking: Reported on 11/30/2022) 60 capsule 1   Multiple Vitamins-Minerals (MULTIVITAMIN ADULT) CHEW Chew 1 tablet by mouth daily at 12 noon.     olmesartan (BENICAR) 20 MG tablet Take 1 tablet (20 mg total) by mouth daily. 90 tablet 1   potassium chloride (KLOR-CON M) 10 MEQ tablet Take 1 tablet (10 mEq total) by mouth daily. 60 tablet 5   simvastatin (ZOCOR) 20 MG tablet TAKE 1 TABLET BY MOUTH AT  BEDTIME 90 tablet 3   terbinafine (LAMISIL AT) 1 % cream Apply 1 Application topically 2 (two) times daily. 30 g 0   tirzepatide (MOUNJARO) 12.5 MG/0.5ML Pen Inject 12.5 mg into the skin once a week. 6 mL 1   TRESIBA FLEXTOUCH 200 UNIT/ML FlexTouch Pen Inject 56 Units into the skin at bedtime. 45 mL 3   levothyroxine (SYNTHROID) 50 MCG tablet TAKE 1 TABLET BY MOUTH DAILY 100 tablet 2   NOVOFINE PEN NEEDLE 32G X 6 MM MISC Use to inject insulin 4 times daily 200 each 2   No facility-administered medications prior to visit.    Allergies  Allergen Reactions   Corn Oil Rash   Other Rash    PT STATES SHE IS ALLERGIC TO MOST FOODS-STATES IT WAS FOUND IN ALLERGY TESTING   Peanut Allergen Powder-Dnfp Rash   Peanut-Containing Drug Products Rash   Penicillins Rash    Has patient had a PCN reaction causing immediate rash, facial/tongue/throat swelling, SOB or lightheadedness with hypotension: Yes Has patient had a PCN reaction causing severe rash involving mucus membranes or skin necrosis: No Has patient had a PCN reaction that required hospitalization No Has patient had a PCN reaction occurring within the last 10 years: No. Has not had in 40-50 years If all of the above answers are "NO", then may proceed with  Cephalosporin use.    ROS Review of Systems  Constitutional:  Negative for chills and fever.  Eyes:  Negative for visual disturbance.  Respiratory:  Negative for chest tightness and shortness of breath.   Neurological:  Negative for dizziness and headaches.      Objective:    Physical Exam Constitutional:      Appearance: She is obese.  HENT:     Head: Normocephalic.     Mouth/Throat:     Mouth: Mucous membranes are moist.  Cardiovascular:     Rate and Rhythm: Normal rate.     Heart sounds: Normal heart sounds.  Pulmonary:     Effort: Pulmonary effort is normal.     Breath sounds: Normal breath sounds.  Neurological:     Mental Status: She is alert.     BP 138/86   Pulse 73   Ht 5' (1.524 m)   Wt 275 lb 0.1 oz (124.7 kg)   SpO2 95%   BMI 53.71 kg/m  Wt Readings from Last 3 Encounters:  11/30/22 275 lb (124.7 kg)  11/27/22 275 lb 0.1 oz (124.7 kg)  11/16/22 270 lb 1 oz (122.5 kg)    Lab Results  Component Value Date   TSH 3.020 11/27/2022   Lab Results  Component Value Date   WBC 8.8 09/06/2022   HGB 11.2 09/06/2022   HCT 35.7 09/06/2022   MCV 81 09/06/2022   PLT 298 09/06/2022   Lab Results  Component Value Date   NA 141 09/06/2022   K 5.0 09/06/2022   CO2 20 09/06/2022   GLUCOSE 146 (H) 09/06/2022   BUN 26 09/06/2022  CREATININE 2.01 (H) 09/06/2022   BILITOT 0.2 09/06/2022   ALKPHOS 145 (H) 09/06/2022   AST 15 09/06/2022   ALT 10 09/06/2022   PROT 6.8 09/06/2022   ALBUMIN 4.1 09/06/2022   CALCIUM 9.3 09/06/2022   ANIONGAP 10 09/15/2020   EGFR 25 (L) 09/06/2022   Lab Results  Component Value Date   CHOL 125 09/06/2022   Lab Results  Component Value Date   HDL 45 09/06/2022   Lab Results  Component Value Date   LDLCALC 52 09/06/2022   Lab Results  Component Value Date   TRIG 164 (H) 09/06/2022   Lab Results  Component Value Date   CHOLHDL 2.8 09/06/2022   Lab Results  Component Value Date   HGBA1C 8.0 (A) 05/08/2022       Assessment & Plan:  Other specified hypothyroidism Assessment & Plan: She take synthroid 75 mcg daily C/o weight gain and will like to assess her thyroid levels Pending TSH Encouraged to increase her intake of foods rich in fruits, vegetables, whole grains Lean proteins: chicken, fish, beans, legumes Low Fat dairy products Reduced intake of saturated fats, trans fatty acids, cholesterol Aim to be active at least 5 days a week for 30 minutes each day ( walking briskly)   Orders: -     TSH + free T4  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    -she is encouraged to continue monitoring glucose 4 times daily (using her CGM), before meals and before bed, and to call the clinic if she has readings less than 70 or above 300 for 3 tests in a row.     - she is warned not to take insulin without proper monitoring per orders. - Adjustment parameters are given to her for hypo and hyperglycemia in writing.   - her Metformin was discontinued, risk outweighs benefit for this patient given recent renal decline.  Orders: -     HM Diabetes Foot Exam -     Novofine Pen Needle; Use to inject insulin 4 times daily  Dispense: 200 each; Refill: 2  Essential hypertension Assessment & Plan: Controlled She takes olmesartan 20 mg and Lasix 20 mg as needed for peripheral edema No edema noted today Patient is asymptomatic Encouraged low-sodium diet with increased physical activity BP Readings from Last 3  Encounters:  11/27/22 138/86  11/16/22 (!) 143/58  10/16/22 111/75       Elevated lipoprotein(a) Assessment & Plan: She takes simvastatin 20 mg daily Denies muscle aches and pain Pending labs Lab Results  Component Value Date   CHOL 125 09/06/2022   HDL 45 09/06/2022   LDLCALC 52 09/06/2022   TRIG 164 (H) 09/06/2022   CHOLHDL 2.8 09/06/2022     Note: This chart has been completed using [employer] software, and while attempts have been made to ensure accuracy, certain words and phrases may not be transcribed as intended.    Follow-up: Return in about 3 months (around 02/27/2023).   Gilmore Laroche,  FNP

## 2022-11-28 NOTE — Progress Notes (Signed)
Kindly inform the patient that her thyroid levels are stable

## 2022-11-30 ENCOUNTER — Ambulatory Visit (INDEPENDENT_AMBULATORY_CARE_PROVIDER_SITE_OTHER): Payer: Medicare Other

## 2022-11-30 VITALS — Ht 60.0 in | Wt 275.0 lb

## 2022-11-30 DIAGNOSIS — Z0000 Encounter for general adult medical examination without abnormal findings: Secondary | ICD-10-CM | POA: Diagnosis not present

## 2022-11-30 NOTE — Patient Instructions (Signed)
Jessica Strong , Thank you for taking time to come for your Medicare Wellness Visit. I appreciate your ongoing commitment to your health goals. Please review the following plan we discussed and let me know if I can assist you in the future.   These are the goals we discussed:  Goals      Patient Stated     Patient states that her goal is to feel better.     Patient Stated     Patients goal is to be able to walk more and lose weight        This is a list of the screening recommended for you and due dates:  Health Maintenance  Topic Date Due   Yearly kidney health urinalysis for diabetes  Never done   DTaP/Tdap/Td vaccine (1 - Tdap) Never done   COVID-19 Vaccine (4 - 2023-24 season) 12/30/2021   Hemoglobin A1C  11/06/2022   Flu Shot  11/30/2022   Yearly kidney function blood test for diabetes  09/06/2023   Eye exam for diabetics  10/23/2023   Complete foot exam   11/27/2023   Medicare Annual Wellness Visit  11/30/2023   Colon Cancer Screening  09/17/2025   Pneumonia Vaccine  Completed   DEXA scan (bone density measurement)  Completed   Hepatitis C Screening  Completed   Zoster (Shingles) Vaccine  Completed   HPV Vaccine  Aged Out    Advanced directives: Advance directive discussed with you today. Even though you declined this today, please call our office should you change your mind, and we can give you the proper paperwork for you to fill out. Advance care planning is a way to make decisions about medical care that fits your values in case you are ever unable to make these decisions for yourself.  Information on Advanced Care Planning can be found at [facility] [place] Advance Health Care Directives Advance Health Care Directives ([email or web address]    Conditions/risks identified:  Aim for 30 minutes of exercise or brisk walking, 6-8 glasses of water, and 5 servings of fruits and vegetables each day.   Next appointment: VIRTUAL/TELEPHONE APPOINTMENT Follow up in one year  for your annual wellness visit  January 01, 2024 at 11:00 am telephone visit.    Preventive Care 32 Years and Older, Female Preventive care refers to lifestyle choices and visits with your health care provider that can promote health and wellness. What does preventive care include? A yearly physical exam. This is also called an annual well check. Dental exams once or twice a year. Routine eye exams. Ask your health care provider how often you should have your eyes checked. Personal lifestyle choices, including: Daily care of your teeth and gums. Regular physical activity. Eating a healthy diet. Avoiding tobacco and drug use. Limiting alcohol use. Practicing safe sex. Taking low-dose aspirin every day. Taking vitamin and mineral supplements as recommended by your health care provider. What happens during an annual well check? The services and screenings done by your health care provider during your annual well check will depend on your age, overall health, lifestyle risk factors, and family history of disease. Counseling  Your health care provider may ask you questions about your: Alcohol use. Tobacco use. Drug use. Emotional well-being. Home and relationship well-being. Sexual activity. Eating habits. History of falls. Memory and ability to understand (cognition). Work and work Astronomer. Reproductive health. Screening  You may have the following tests or measurements: Height, weight, and BMI. Blood pressure. Lipid and cholesterol  levels. These may be checked every 5 years, or more frequently if you are over 22 years old. Skin check. Lung cancer screening. You may have this screening every year starting at age 61 if you have a 30-pack-year history of smoking and currently smoke or have quit within the past 15 years. Fecal occult blood test (FOBT) of the stool. You may have this test every year starting at age 9. Flexible sigmoidoscopy or colonoscopy. You may have a  sigmoidoscopy every 5 years or a colonoscopy every 10 years starting at age 25. Hepatitis C blood test. Hepatitis B blood test. Sexually transmitted disease (STD) testing. Diabetes screening. This is done by checking your blood sugar (glucose) after you have not eaten for a while (fasting). You may have this done every 1-3 years. Bone density scan. This is done to screen for osteoporosis. You may have this done starting at age 46. Mammogram. This may be done every 1-2 years. Talk to your health care provider about how often you should have regular mammograms. Talk with your health care provider about your test results, treatment options, and if necessary, the need for more tests. Vaccines  Your health care provider may recommend certain vaccines, such as: Influenza vaccine. This is recommended every year. Tetanus, diphtheria, and acellular pertussis (Tdap, Td) vaccine. You may need a Td booster every 10 years. Zoster vaccine. You may need this after age 31. Pneumococcal 13-valent conjugate (PCV13) vaccine. One dose is recommended after age 55. Pneumococcal polysaccharide (PPSV23) vaccine. One dose is recommended after age 52. Talk to your health care provider about which screenings and vaccines you need and how often you need them. This information is not intended to replace advice given to you by your health care provider. Make sure you discuss any questions you have with your health care provider. Document Released: 05/14/2015 Document Revised: 01/05/2016 Document Reviewed: 02/16/2015 Elsevier Interactive Patient Education  2017 [employer].  Fall Prevention in the Home Falls can cause injuries. They can happen to people of all ages. There are many things you can do to make your home safe and to help prevent falls. What can I do on the outside of my home? Regularly fix the edges of walkways and driveways and fix any cracks. Remove anything that might make you trip as you walk through a  door, such as a raised step or threshold. Trim any bushes or trees on the path to your home. Use bright outdoor lighting. Clear any walking paths of anything that might make someone trip, such as rocks or tools. Regularly check to see if handrails are loose or broken. Make sure that both sides of any steps have handrails. Any raised decks and porches should have guardrails on the edges. Have any leaves, snow, or ice cleared regularly. Use sand or salt on walking paths during winter. Clean up any spills in your garage right away. This includes oil or grease spills. What can I do in the bathroom? Use night lights. Install grab bars by the toilet and in the tub and shower. Do not use towel bars as grab bars. Use non-skid mats or decals in the tub or shower. If you need to sit down in the shower, use a plastic, non-slip stool. Keep the floor dry. Clean up any water that spills on the floor as soon as it happens. Remove soap buildup in the tub or shower regularly. Attach bath mats securely with double-sided non-slip rug tape. Do not have throw rugs and other things  on the floor that can make you trip. What can I do in the bedroom? Use night lights. Make sure that you have a light by your bed that is easy to reach. Do not use any sheets or blankets that are too big for your bed. They should not hang down onto the floor. Have a firm chair that has side arms. You can use this for support while you get dressed. Do not have throw rugs and other things on the floor that can make you trip. What can I do in the kitchen? Clean up any spills right away. Avoid walking on wet floors. Keep items that you use a lot in easy-to-reach places. If you need to reach something above you, use a strong step stool that has a grab bar. Keep electrical cords out of the way. Do not use floor polish or wax that makes floors slippery. If you must use wax, use non-skid floor wax. Do not have throw rugs and other things  on the floor that can make you trip. What can I do with my stairs? Do not leave any items on the stairs. Make sure that there are handrails on both sides of the stairs and use them. Fix handrails that are broken or loose. Make sure that handrails are as long as the stairways. Check any carpeting to make sure that it is firmly attached to the stairs. Fix any carpet that is loose or worn. Avoid having throw rugs at the top or bottom of the stairs. If you do have throw rugs, attach them to the floor with carpet tape. Make sure that you have a light switch at the top of the stairs and the bottom of the stairs. If you do not have them, ask someone to add them for you. What else can I do to help prevent falls? Wear shoes that: Do not have high heels. Have rubber bottoms. Are comfortable and fit you well. Are closed at the toe. Do not wear sandals. If you use a stepladder: Make sure that it is fully opened. Do not climb a closed stepladder. Make sure that both sides of the stepladder are locked into place. Ask someone to hold it for you, if possible. Clearly mark and make sure that you can see: Any grab bars or handrails. First and last steps. Where the edge of each step is. Use tools that help you move around (mobility aids) if they are needed. These include: Canes. Walkers. Scooters. Crutches. Turn on the lights when you go into a dark area. Replace any light bulbs as soon as they burn out. Set up your furniture so you have a clear path. Avoid moving your furniture around. If any of your floors are uneven, fix them. If there are any pets around you, be aware of where they are. Review your medicines with your doctor. Some medicines can make you feel dizzy. This can increase your chance of falling. Ask your doctor what other things that you can do to help prevent falls. This information is not intended to replace advice given to you by your health care provider. Make sure you discuss any  questions you have with your health care provider. Document Released: 02/11/2009 Document Revised: 09/23/2015 Document Reviewed: 05/22/2014 Elsevier Interactive Patient Education  2017 [employer].

## 2022-11-30 NOTE — Progress Notes (Signed)
Because this visit was a virtual/telehealth visit,  certain criteria was not obtained, such a blood pressure, CBG if patient is a diabetic, and timed up and go. Any medications not marked as "taking" was not mentioned during the medication reconciliation part of the visit. Any vitals not documented were not able to be obtained due to this being a telehealth visit. Vitals documented are verbally provided by the patient.  Per patient no change in vitals since last visit, unable to obtain new vitals due to telehealth visit.   Subjective:   Jessica Strong is a 77 y.o. female who presents for Medicare Annual (Subsequent) preventive examination.  Visit Complete: Virtual  I connected with  Jessica Strong on 11/30/22 by a audio enabled telemedicine application and verified that I am speaking with the correct person using two identifiers.  Patient Location: Home  Provider Location: Home Office  I discussed the limitations of evaluation and management by telemedicine. The patient expressed understanding and agreed to proceed.  Patient Medicare AWV questionnaire was completed by the patient on n/a; I have confirmed that all information answered by patient is correct and no changes since this date.  Review of Systems     Cardiac Risk Factors include: advanced age (>60men, >59 women);diabetes mellitus;dyslipidemia;hypertension;sedentary lifestyle;obesity (BMI >30kg/m2)     Objective:    Today's Vitals   11/30/22 1057 11/30/22 1103  Weight: 275 lb (124.7 kg)   Height: 5' (1.524 m)   PainSc:  0-No pain   Body mass index is 53.71 kg/m.     11/30/2022   10:57 AM 11/16/2022    2:21 PM 10/24/2021    3:48 PM 09/17/2020    9:09 AM 09/15/2020    9:38 AM 07/12/2017    9:05 AM 02/12/2017   10:27 AM  Advanced Directives  Does Patient Have a Medical Advance Directive? No Yes Yes Yes Yes Yes Yes  Type of Furniture conservator/restorer;Living will Healthcare Power of [place];Living  will Living will Healthcare Power of [place];Living will Healthcare Power of [place];Living will Healthcare Power of [place];Living will  Does patient want to make changes to medical advance directive?     No - Patient declined No - Patient declined   Copy of Healthcare Power of Attorney in Chart?   No - copy requested No - copy requested Yes - validated most recent copy scanned in chart (See row information) No - copy requested No - copy requested  Would patient like information on creating a medical advance directive? No - Patient declined No - Patient declined         Current Medications (verified) Outpatient Encounter Medications as of 11/30/2022  Medication Sig   ACCU-CHEK AVIVA PLUS test strip Inject 1 strip as directed daily.   acetaminophen (TYLENOL) 500 MG tablet Take 500 mg by mouth every 6 (six) hours as needed for mild pain or headache. As needed.   albuterol (VENTOLIN HFA) 108 (90 Base) MCG/ACT inhaler Inhale 2 puffs into the lungs every 6 (six) hours as needed for wheezing or shortness of breath.   aspirin 325 MG tablet Take 325 mg by mouth daily.   bisacodyl (DULCOLAX) 5 MG EC tablet Take 5 mg by mouth. One every other day   Calcium Carb-Cholecalciferol (CALCIUM 600-D PO) Take 1 tablet by mouth daily.   EPINEPHrine 0.3 mg/0.3 mL IJ SOAJ injection Inject 0.3 mg into the muscle as needed for anaphylaxis.   ferrous sulfate 325 (65 FE) MG tablet Take 325 mg  by mouth daily with breakfast.   furosemide (LASIX) 20 MG tablet TAKE 1 TABLET BY MOUTH DAILY.  (MAY TAKE EXTRA 1 TABLET AS  NEEDED FOR SEVERE SWELLING)   HUMALOG KWIKPEN 100 UNIT/ML KwikPen Inject 5-8 Units into the skin 3 (three) times daily.   Lancets Thin MISC Inject 1 Stick into the skin daily.   levothyroxine (SYNTHROID) 75 MCG tablet Take 1 tablet (75 mcg total) by mouth daily before breakfast.   Multiple Vitamins-Minerals (MULTIVITAMIN ADULT) CHEW Chew 1 tablet by mouth daily at 12 noon.   NOVOFINE PEN NEEDLE 32G X 6 MM  MISC Use to inject insulin 4 times daily   olmesartan (BENICAR) 20 MG tablet Take 1 tablet (20 mg total) by mouth daily.   potassium chloride (KLOR-CON M) 10 MEQ tablet Take 1 tablet (10 mEq total) by mouth daily.   simvastatin (ZOCOR) 20 MG tablet TAKE 1 TABLET BY MOUTH AT  BEDTIME   terbinafine (LAMISIL AT) 1 % cream Apply 1 Application topically 2 (two) times daily.   tirzepatide (MOUNJARO) 12.5 MG/0.5ML Pen Inject 12.5 mg into the skin once a week.   TRESIBA FLEXTOUCH 200 UNIT/ML FlexTouch Pen Inject 56 Units into the skin at bedtime.   lubiprostone (AMITIZA) 8 MCG capsule Take 1 capsule (8 mcg total) by mouth 2 (two) times daily with a meal. (Patient not taking: Reported on 11/30/2022)   No facility-administered encounter medications on file as of 11/30/2022.    Allergies (verified) Corn oil, Other, Peanut allergen powder-dnfp, Peanut-containing drug products, and Penicillins   History: Past Medical History:  Diagnosis Date   Allergy    Anemia    Anxiety    Arthritis    Asthma    Cataract    cervical ca 1990   Depression    Diabetes (HCC)    Dyspnea    Hypercholesterolemia    Hypertension    Hypothyroidism    Myocardial infarction (HCC) 2005   Sleep apnea    on CPAP 14 mm PS since February 2017   Stroke [facility]) 2007   weakness on R side   Vitamin D deficiency    Past Surgical History:  Procedure Laterality Date   ABDOMINAL HYSTERECTOMY     BARIATRIC SURGERY     BIOPSY  09/17/2020   Procedure: BIOPSY;  Surgeon: Dolores Frame, MD;  Location: [facility];  Service: Gastroenterology;;   BREAST BIOPSY Right    beningn   CARDIAC CATHETERIZATION N/A 12/24/2015   Procedure: Right/Left Heart Cath and Coronary Angiography;  Surgeon: Corky Crafts, MD;  Location: [facility] INVASIVE CV LAB;  Service: Cardiovascular;  Laterality: N/A;   COLONOSCOPY  2018   COLONOSCOPY WITH PROPOFOL N/A 09/17/2020   Procedure: COLONOSCOPY WITH PROPOFOL;  Surgeon: Dolores Frame, MD;  Location: [facility];  Service: Gastroenterology;  Laterality: N/A;  9:00   ESOPHAGOGASTRODUODENOSCOPY (EGD) WITH PROPOFOL N/A 09/17/2020   Procedure: ESOPHAGOGASTRODUODENOSCOPY (EGD) WITH PROPOFOL;  Surgeon: Dolores Frame, MD;  Location: [facility];  Service: Gastroenterology;  Laterality: N/A;   HERNIA REPAIR     LEFT HEART CATH AND CORONARY ANGIOGRAPHY N/A 07/12/2017   Procedure: LEFT HEART CATH AND CORONARY ANGIOGRAPHY;  Surgeon: Lyn Records, MD;  Location: [facility] INVASIVE CV LAB;  Service: Cardiovascular;  Laterality: N/A;   Family History  Problem Relation Age of Onset   Cancer Mother    Colon cancer Mother    Diabetes Mother    Hypertension Father    Thyroid disease Father  Thyroid cancer Father    Diabetes Father    Esophageal cancer Father    Breast cancer Maternal Aunt    Colon cancer Maternal Aunt    Breast cancer Maternal Aunt    Colon cancer Maternal Grandmother    Stroke Maternal Grandfather    Stomach cancer Paternal Grandfather    Colon cancer Son    Colon polyps Neg Hx    Rectal cancer Neg Hx    Social History   Socioeconomic History   Marital status: Married    Spouse name: Not on file   Number of children: 2   Years of education: Not on file   Highest education level: Not on file  Occupational History   Occupation: retired  Tobacco Use   Smoking status: Never    Passive exposure: Never   Smokeless tobacco: Never  Vaping Use   Vaping status: Never Used  Substance and Sexual Activity   Alcohol use: Not Currently    Comment: occasionally   Drug use: No   Sexual activity: Never    Partners: Male  Other Topics Concern   Not on file  Social History Narrative   Not on file   Social Determinants of Health   Financial Resource Strain: Low Risk  (11/30/2022)   Overall Financial Resource Strain (CARDIA)    Difficulty of Paying Living Expenses: Not hard at all  Food Insecurity: No Food Insecurity (11/30/2022)   Hunger  Vital Sign    Worried About Running Out of Food in the Last Year: Never true    Ran Out of Food in the Last Year: Never true  Transportation Needs: No Transportation Needs (11/30/2022)   PRAPARE - Administrator, Civil Service (Medical): No    Lack of Transportation (Non-Medical): No  Physical Activity: Inactive (11/30/2022)   Exercise Vital Sign    Days of Exercise per Week: 0 days    Minutes of Exercise per Session: 0 min  Stress: No Stress Concern Present (11/30/2022)   [employer] of Occupational Health - Occupational Stress Questionnaire    Feeling of Stress : Not at all  Social Connections: Moderately Isolated (11/30/2022)   Social Connection and Isolation Panel [NHANES]    Frequency of Communication with Friends and Family: More than three times a week    Frequency of Social Gatherings with Friends and Family: More than three times a week    Attends Religious Services: Never    Database administrator or Organizations: No    Attends Engineer, structural: Never    Marital Status: Married    Tobacco Counseling Counseling given: Yes   Clinical Intake:  Pre-visit preparation completed: Yes  Pain : No/denies pain Pain Score: 0-No pain     BMI - recorded: 53.71 Nutritional Status: BMI > 30  Obese Nutritional Risks: None Diabetes: Yes CBG done?: No (telehealth visit. unable to obtain cbg) Did pt. bring in CBG monitor from home?: No  How often do you need to have someone help you when you read instructions, pamphlets, or other written materials from your doctor or pharmacy?: 1 - Never  Interpreter Needed?: No  Information entered by :: Abby , CMA   Activities of Daily Living    11/30/2022   11:10 AM 11/16/2022    2:23 PM  In your present state of health, do you have any difficulty performing the following activities:  Hearing? 0   Vision? 0   Difficulty concentrating or making decisions? 0  Walking or climbing stairs? 1   Comment has a  very hard time walking and uses a cane   Dressing or bathing? 0   Doing errands, shopping? 0 0  Preparing Food and eating ? N   Using the Toilet? N   In the past six months, have you accidently leaked urine? N   Do you have problems with loss of bowel control? N   Managing your Medications? N   Managing your Finances? N   Housekeeping or managing your Housekeeping? N     Patient Care Team: Gilmore Laroche, FNP as PCP - General (Family Medicine) Wyline Mood Dorothe Pea, MD as PCP - Cardiology (Cardiology)  Indicate any recent Medical Services you may have received from other than [facility] providers in the past year (date may be approximate).     Assessment:   This is a routine wellness examination for [name].  Hearing/Vision screen Hearing Screening - Comments:: Patient denies any hearing difficulties.    Dietary issues and exercise activities discussed:     Goals Addressed             This Visit's Progress    Patient Stated       Patients goal is to be able to walk more and lose weight       Depression Screen    11/30/2022   11:08 AM 11/27/2022    3:18 PM 11/27/2022    2:42 PM 05/24/2022    3:15 PM 02/21/2022    3:23 PM 02/02/2022    3:49 PM 11/22/2021    3:49 PM  PHQ 2/9 Scores  PHQ - 2 Score 0 5 0 6 6 6 5   PHQ- 9 Score 0 19 0 15 12 15 15     Fall Risk    11/30/2022   11:10 AM 11/27/2022    2:42 PM 05/24/2022    3:15 PM 02/21/2022    3:23 PM 11/22/2021    3:49 PM  Fall Risk   Falls in the past year? 0 0 0 0 0  Number falls in past yr: 0 0 0 0 0  Injury with Fall? 0 0 0 0 0  Risk for fall due to : No Fall Risks No Fall Risks No Fall Risks No Fall Risks No Fall Risks  Follow up Falls prevention discussed Falls evaluation completed Falls evaluation completed Falls evaluation completed Falls evaluation completed    MEDICARE RISK AT HOME:  Medicare Risk at Home - 11/30/22 1109     Any stairs in or around the home? No    If so, are there any without handrails? No     Home free of loose throw rugs in walkways, pet beds, electrical cords, etc? Yes    Adequate lighting in your home to reduce risk of falls? Yes    Life alert? No    Use of a cane, walker or w/c? Yes    Grab bars in the bathroom? No    Shower chair or bench in shower? Yes    Elevated toilet seat or a handicapped toilet? No             TIMED UP AND GO:  Was the test performed?  No    Cognitive Function:        11/30/2022   11:05 AM 10/24/2021    3:55 PM  6CIT Screen  What Year? 0 points 0 points  What month? 0 points 0 points  What time? 0 points 0 points  Count back from  20 0 points 0 points  Months in reverse 0 points 0 points  Repeat phrase 8 points 0 points  Total Score 8 points 0 points    Immunizations Immunization History  Administered Date(s) Administered   Moderna Sars-Covid-2 Vaccination 07/11/2019, 08/08/2019, 08/11/2020   PNEUMOCOCCAL CONJUGATE-20 02/21/2022   Zoster Recombinant(Shingrix) 08/17/2022, 10/02/2022    TDAP status: Due, Education has been provided regarding the importance of this vaccine. Advised may receive this vaccine at local pharmacy or Health Dept. Aware to provide a copy of the vaccination record if obtained from local pharmacy or Health Dept. Verbalized acceptance and understanding.  Flu Vaccine status: Up to date  Pneumococcal vaccine status: Up to date  Covid-19 vaccine status: Information provided on how to obtain vaccines.   Qualifies for Shingles Vaccine? Yes   Zostavax completed Yes   Shingrix Completed?: Yes  Screening Tests Health Maintenance  Topic Date Due   Diabetic kidney evaluation - Urine ACR  Never done   DTaP/Tdap/Td (1 - Tdap) Never done   COVID-19 Vaccine (4 - 2023-24 season) 12/30/2021   Medicare Annual Wellness (AWV)  10/25/2022   HEMOGLOBIN A1C  11/06/2022   INFLUENZA VACCINE  11/30/2022   Diabetic kidney evaluation - eGFR measurement  09/06/2023   OPHTHALMOLOGY EXAM  10/23/2023   FOOT EXAM  11/27/2023    Colonoscopy  09/17/2025   Pneumonia Vaccine 27+ Years old  Completed   DEXA SCAN  Completed   Hepatitis C Screening  Completed   Zoster Vaccines- Shingrix  Completed   HPV VACCINES  Aged Out    Health Maintenance  Health Maintenance Due  Topic Date Due   Diabetic kidney evaluation - Urine ACR  Never done   DTaP/Tdap/Td (1 - Tdap) Never done   COVID-19 Vaccine (4 - 2023-24 season) 12/30/2021   Medicare Annual Wellness (AWV)  10/25/2022   HEMOGLOBIN A1C  11/06/2022   INFLUENZA VACCINE  11/30/2022    Colorectal cancer screening: Type of screening: Colonoscopy. Completed 09/17/2020. Repeat every 5 years  Mammogram status: Completed 09/11/2022. Repeat every year  Bone Density status: Completed 11/04/2021. Results reflect: Bone density results: OSTEOPENIA. Repeat every 2 years.  Lung Cancer Screening: (Low Dose CT Chest recommended if Age 34-80 years, 20 pack-year currently smoking OR have quit w/in 15years.) does not qualify.   Additional Screening:  Hepatitis C Screening: does not qualify; Completed 08/17/2022  Vision Screening: Recommended annual ophthalmology exams for early detection of glaucoma and other disorders of the eye. Is the patient up to date with their annual eye exam?  Yes  Who is the provider or what is the name of the office in which the patient attends annual eye exams? Dr[facility] If pt is not established with a provider, would they like to be referred to a provider to establish care? No .   Dental Screening: Recommended annual dental exams for proper oral hygiene  Diabetic Foot Exam: Diabetic Foot Exam: Completed 11/27/2022  Community Resource Referral / Chronic Care Management: CRR required this visit?  No   CCM required this visit?  No     Plan:     I have personally reviewed and noted the following in the patient's chart:   Medical and social history Use of alcohol, tobacco or illicit drugs  Current medications and supplements including  opioid prescriptions. Patient is not currently taking opioid prescriptions. Functional ability and status Nutritional status Physical activity Advanced directives List of other physicians Hospitalizations, surgeries, and ER visits in previous 12 months  Vitals Screenings to include cognitive, depression, and falls Referrals and appointments  In addition, I have reviewed and discussed with patient certain preventive protocols, quality metrics, and best practice recommendations. A written personalized care plan for preventive services as well as general preventive health recommendations were provided to patient.     Jordan Hawks , CMA   11/30/2022   After Visit Summary: (Mail) Due to this being a telephonic visit, the after visit summary with patients personalized plan was offered to patient via mail   Nurse Notes:

## 2022-12-01 NOTE — Assessment & Plan Note (Signed)
She takes simvastatin 20 mg daily Denies muscle aches and pain Pending labs Lab Results  Component Value Date   CHOL 125 09/06/2022   HDL 45 09/06/2022   LDLCALC 52 09/06/2022   TRIG 164 (H) 09/06/2022   CHOLHDL 2.8 09/06/2022

## 2022-12-01 NOTE — Assessment & Plan Note (Signed)
She followed up with endocrinology on 09/06/2022 Plan of care as follow:  -She is advised to continue her Tresiba 55 units SQ nightly, adjust her Humalog to 5-8 units TID with meals if glucose is above 90 and she is eating (Specific instructions on how to titrate insulin dosage based on glucose readings given to patient in writing) and continue Mounjaro 12.5 mg SQ weekly (will hold off on increasing due to medication shortage).    -she is encouraged to continue monitoring glucose 4 times daily (using her CGM), before meals and before bed, and to call the clinic if she has readings less than 70 or above 300 for 3 tests in a row.     - she is warned not to take insulin without proper monitoring per orders. - Adjustment parameters are given to her for hypo and hyperglycemia in writing.   - her Metformin was discontinued, risk outweighs benefit for this patient given recent renal decline.

## 2022-12-01 NOTE — Assessment & Plan Note (Signed)
She take synthroid 75 mcg daily C/o weight gain and will like to assess her thyroid levels Pending TSH Encouraged to increase her intake of foods rich in fruits, vegetables, whole grains Lean proteins: chicken, fish, beans, legumes Low Fat dairy products Reduced intake of saturated fats, trans fatty acids, cholesterol Aim to be active at least 5 days a week for 30 minutes each day ( walking briskly)

## 2022-12-01 NOTE — Assessment & Plan Note (Signed)
Controlled She takes olmesartan 20 mg and Lasix 20 mg as needed for peripheral edema No edema noted today Patient is asymptomatic Encouraged low-sodium diet with increased physical activity BP Readings from Last 3 Encounters:  11/27/22 138/86  11/16/22 (!) 143/58  10/16/22 111/75

## 2022-12-04 MED ORDER — LACTATED RINGERS IV SOLN: INTRAVENOUS | Status: DC

## 2022-12-05 ENCOUNTER — Encounter (HOSPITAL_COMMUNITY)
Admission: RE | Admit: 2022-12-05 | Discharge: 2022-12-05 | Disposition: A | Discharge status: Home / Self Care | Payer: Medicare Other | Source: Ambulatory Visit | Attending: Gastroenterology | Admitting: Gastroenterology

## 2022-12-05 DIAGNOSIS — R10811 Right upper quadrant abdominal tenderness: Secondary | ICD-10-CM

## 2022-12-08 ENCOUNTER — Other Ambulatory Visit: Payer: Self-pay

## 2022-12-08 ENCOUNTER — Ambulatory Visit (HOSPITAL_BASED_OUTPATIENT_CLINIC_OR_DEPARTMENT_OTHER): Payer: Medicare Other | Admitting: Anesthesiology

## 2022-12-08 ENCOUNTER — Ambulatory Visit (HOSPITAL_COMMUNITY)
Admission: RE | Admit: 2022-12-08 | Discharge: 2022-12-08 | Disposition: A | Discharge status: Home / Self Care | Payer: Medicare Other | Source: Home / Self Care | Attending: Gastroenterology | Admitting: Gastroenterology

## 2022-12-08 ENCOUNTER — Encounter (HOSPITAL_COMMUNITY): Payer: Self-pay | Admitting: Gastroenterology

## 2022-12-08 ENCOUNTER — Encounter (HOSPITAL_COMMUNITY)
Admission: RE | Disposition: A | Discharge status: Home / Self Care | Payer: Self-pay | Source: Home / Self Care | Attending: Gastroenterology

## 2022-12-08 ENCOUNTER — Ambulatory Visit (HOSPITAL_COMMUNITY): Payer: Medicare Other | Admitting: Anesthesiology

## 2022-12-08 DIAGNOSIS — E119 Type 2 diabetes mellitus without complications: Secondary | ICD-10-CM | POA: Diagnosis not present

## 2022-12-08 DIAGNOSIS — Z7985 Long-term (current) use of injectable non-insulin antidiabetic drugs: Secondary | ICD-10-CM | POA: Diagnosis not present

## 2022-12-08 DIAGNOSIS — F418 Other specified anxiety disorders: Secondary | ICD-10-CM | POA: Diagnosis not present

## 2022-12-08 DIAGNOSIS — K2980 Duodenitis without bleeding: Secondary | ICD-10-CM | POA: Insufficient documentation

## 2022-12-08 DIAGNOSIS — Z8673 Personal history of transient ischemic attack (TIA), and cerebral infarction without residual deficits: Secondary | ICD-10-CM | POA: Insufficient documentation

## 2022-12-08 DIAGNOSIS — R10811 Right upper quadrant abdominal tenderness: Secondary | ICD-10-CM

## 2022-12-08 DIAGNOSIS — Z794 Long term (current) use of insulin: Secondary | ICD-10-CM | POA: Diagnosis not present

## 2022-12-08 DIAGNOSIS — K2950 Unspecified chronic gastritis without bleeding: Secondary | ICD-10-CM | POA: Diagnosis not present

## 2022-12-08 DIAGNOSIS — E785 Hyperlipidemia, unspecified: Secondary | ICD-10-CM | POA: Insufficient documentation

## 2022-12-08 DIAGNOSIS — Z9884 Bariatric surgery status: Secondary | ICD-10-CM | POA: Diagnosis not present

## 2022-12-08 DIAGNOSIS — F32A Depression, unspecified: Secondary | ICD-10-CM | POA: Insufficient documentation

## 2022-12-08 DIAGNOSIS — K5909 Other constipation: Secondary | ICD-10-CM | POA: Insufficient documentation

## 2022-12-08 DIAGNOSIS — I252 Old myocardial infarction: Secondary | ICD-10-CM | POA: Diagnosis not present

## 2022-12-08 DIAGNOSIS — I10 Essential (primary) hypertension: Secondary | ICD-10-CM | POA: Insufficient documentation

## 2022-12-08 DIAGNOSIS — E039 Hypothyroidism, unspecified: Secondary | ICD-10-CM | POA: Insufficient documentation

## 2022-12-08 DIAGNOSIS — Z6841 Body Mass Index (BMI) 40.0 and over, adult: Secondary | ICD-10-CM | POA: Diagnosis not present

## 2022-12-08 DIAGNOSIS — R1010 Upper abdominal pain, unspecified: Secondary | ICD-10-CM | POA: Diagnosis present

## 2022-12-08 DIAGNOSIS — G4730 Sleep apnea, unspecified: Secondary | ICD-10-CM | POA: Diagnosis not present

## 2022-12-08 DIAGNOSIS — F419 Anxiety disorder, unspecified: Secondary | ICD-10-CM | POA: Insufficient documentation

## 2022-12-08 DIAGNOSIS — Z79899 Other long term (current) drug therapy: Secondary | ICD-10-CM | POA: Diagnosis not present

## 2022-12-08 HISTORY — PX: BIOPSY: SHX5522

## 2022-12-08 HISTORY — PX: ESOPHAGOGASTRODUODENOSCOPY (EGD) WITH PROPOFOL: SHX5813

## 2022-12-08 LAB — GLUCOSE, CAPILLARY: Glucose-Capillary: 132 mg/dL — ABNORMAL HIGH (ref 70–99)

## 2022-12-08 SURGERY — ESOPHAGOGASTRODUODENOSCOPY (EGD) WITH PROPOFOL
Anesthesia: General

## 2022-12-08 MED ORDER — LIDOCAINE HCL 1 % IJ SOLN
INTRAMUSCULAR | Status: DC | PRN
  Administered 2022-12-08: 50 mg via INTRADERMAL

## 2022-12-08 MED ORDER — PROPOFOL 10 MG/ML IV BOLUS
INTRAVENOUS | Status: DC | PRN
Start: 2022-12-08 — End: 2022-12-08
  Administered 2022-12-08 (×2): 50 mg via INTRAVENOUS

## 2022-12-08 MED ORDER — LACTATED RINGERS IV SOLN: INTRAVENOUS | Status: DC

## 2022-12-08 NOTE — H&P (Signed)
Jessica Strong is an 77 y.o. female.   Chief Complaint: abdominal pain HPI: Jessica Strong is a 77 y.o. female with past medical history of obesity status post Roux-en-Y gastric bypass, anxiety, depression, diabetes, hyperlipidemia, hypertension, myocardial infarction, stroke, coming for evaluation of abdominal pain.  Patient has presented chronic intermittent pain in her right upper quadrant and right flank.  Denies any nausea, vomiting, fever, chills.  Endorses chronic constipation which she has managed with Dulcolax as needed.  Past Medical History:  Diagnosis Date   Allergy    Anemia    Anxiety    Arthritis    Asthma    Cataract    cervical ca 1990   Depression    Diabetes (HCC)    Dyspnea    Hypercholesterolemia    Hypertension    Hypothyroidism    Myocardial infarction (HCC) 2005   Sleep apnea    on CPAP 14 mm PS since February 2017   Stroke [facility]) 2007   weakness on R side   Vitamin D deficiency     Past Surgical History:  Procedure Laterality Date   ABDOMINAL HYSTERECTOMY     BARIATRIC SURGERY     BIOPSY  09/17/2020   Procedure: BIOPSY;  Surgeon: Dolores Frame, MD;  Location: [facility];  Service: Gastroenterology;;   BREAST BIOPSY Right    beningn   CARDIAC CATHETERIZATION N/A 12/24/2015   Procedure: Right/Left Heart Cath and Coronary Angiography;  Surgeon: Corky Crafts, MD;  Location: [facility]VASIVE CV LAB;  Service: Cardiovascular;  Laterality: N/A;   COLONOSCOPY  2018   COLONOSCOPY WITH PROPOFOL N/A 09/17/2020   Procedure: COLONOSCOPY WITH PROPOFOL;  Surgeon: Dolores Frame, MD;  Location: [facility];  Service: Gastroenterology;  Laterality: N/A;  9:00   ESOPHAGOGASTRODUODENOSCOPY (EGD) WITH PROPOFOL N/A 09/17/2020   Procedure: ESOPHAGOGASTRODUODENOSCOPY (EGD) WITH PROPOFOL;  Surgeon: Dolores Frame, MD;  Location: [facility];  Service: Gastroenterology;  Laterality: N/A;   HERNIA REPAIR     LEFT HEART CATH AND  CORONARY ANGIOGRAPHY N/A 07/12/2017   Procedure: LEFT HEART CATH AND CORONARY ANGIOGRAPHY;  Surgeon: Lyn Records, MD;  Location: [facility] CV LAB;  Service: Cardiovascular;  Laterality: N/A;    Family History  Problem Relation Age of Onset   Cancer Mother    Colon cancer Mother    Diabetes Mother    Hypertension Father    Thyroid disease Father    Thyroid cancer Father    Diabetes Father    Esophageal cancer Father    Breast cancer Maternal Aunt    Colon cancer Maternal Aunt    Breast cancer Maternal Aunt    Colon cancer Maternal Grandmother    Stroke Maternal Grandfather    Stomach cancer Paternal Grandfather    Colon cancer Son    Colon polyps Neg Hx    Rectal cancer Neg Hx    Social History:  reports that she has never smoked. She has never been exposed to tobacco smoke. She has never used smokeless tobacco. She reports that she does not currently use alcohol. She reports that she does not use drugs.  Allergies:  Allergies  Allergen Reactions   Corn Oil Rash   Other Rash    PT STATES SHE IS ALLERGIC TO MOST FOODS-STATES IT WAS FOUND IN ALLERGY TESTING   Peanut Allergen Powder-Dnfp Rash   Peanut-Containing Drug Products Rash   Penicillins Rash    Has patient had a PCN reaction causing immediate rash,  facial/tongue/throat swelling, SOB or lightheadedness with hypotension: Yes Has patient had a PCN reaction causing severe rash involving mucus membranes or skin necrosis: No Has patient had a PCN reaction that required hospitalization No Has patient had a PCN reaction occurring within the last 10 years: No. Has not had in 40-50 years If all of the above answers are "NO", then may proceed with Cephalosporin use.    Medications Prior to Admission  Medication Sig Dispense Refill   ACCU-CHEK AVIVA PLUS test strip Inject 1 strip as directed daily.  4   acetaminophen (TYLENOL) 500 MG tablet Take 500 mg by mouth every 6 (six) hours as needed for mild pain or headache. As  needed.     albuterol (VENTOLIN HFA) 108 (90 Base) MCG/ACT inhaler Inhale 2 puffs into the lungs every 6 (six) hours as needed for wheezing or shortness of breath. 8 g 2   aspirin 325 MG tablet Take 325 mg by mouth daily.     bisacodyl (DULCOLAX) 5 MG EC tablet Take 5 mg by mouth. One every other day     Calcium Carb-Cholecalciferol (CALCIUM 600-D PO) Take 1 tablet by mouth daily.     furosemide (LASIX) 20 MG tablet TAKE 1 TABLET BY MOUTH DAILY.  (MAY TAKE EXTRA 1 TABLET AS  NEEDED FOR SEVERE SWELLING) 167 tablet 3   HUMALOG KWIKPEN 100 UNIT/ML KwikPen Inject 5-8 Units into the skin 3 (three) times daily. 30 mL 3   levothyroxine (SYNTHROID) 75 MCG tablet Take 1 tablet (75 mcg total) by mouth daily before breakfast. 90 tablet 1   Multiple Vitamins-Minerals (MULTIVITAMIN ADULT) CHEW Chew 1 tablet by mouth daily at 12 noon.     NOVOFINE PEN NEEDLE 32G X 6 MM MISC Use to inject insulin 4 times daily 200 each 2   olmesartan (BENICAR) 20 MG tablet Take 1 tablet (20 mg total) by mouth daily. 90 tablet 1   potassium chloride (KLOR-CON M) 10 MEQ tablet Take 1 tablet (10 mEq total) by mouth daily. 60 tablet 5   simvastatin (ZOCOR) 20 MG tablet TAKE 1 TABLET BY MOUTH AT  BEDTIME 90 tablet 3   terbinafine (LAMISIL AT) 1 % cream Apply 1 Application topically 2 (two) times daily. 30 g 0   TRESIBA FLEXTOUCH 200 UNIT/ML FlexTouch Pen Inject 56 Units into the skin at bedtime. 45 mL 3   EPINEPHrine 0.3 mg/0.3 mL IJ SOAJ injection Inject 0.3 mg into the muscle as needed for anaphylaxis.     ferrous sulfate 325 (65 FE) MG tablet Take 325 mg by mouth daily with breakfast.     Lancets Thin MISC Inject 1 Stick into the skin daily.     lubiprostone (AMITIZA) 8 MCG capsule Take 1 capsule (8 mcg total) by mouth 2 (two) times daily with a meal. (Patient not taking: Reported on 11/30/2022) 60 capsule 1   tirzepatide (MOUNJARO) 12.5 MG/0.5ML Pen Inject 12.5 mg into the skin once a week. 6 mL 1    Results for orders placed or  performed during the hospital encounter of 12/08/22 (from the past 48 hour(s))  Glucose, capillary     Status: Abnormal   Collection Time: 12/08/22  9:04 AM  Result Value Ref Range   Glucose-Capillary 132 (H) 70 - 99 mg/dL    Comment: Glucose reference range applies only to samples taken after fasting for at least 8 hours.   No results found.  Review of Systems  Gastrointestinal:  Positive for abdominal pain.  All other systems reviewed  and are negative.   Blood pressure (!) 117/53, pulse 62, temperature 98.1 F (36.7 C), temperature source Oral, resp. rate 20, height 5' (1.524 m), weight 124.7 kg, SpO2 99%. Physical Exam  GENERAL: The patient is AO x3, in no acute distress. HEENT: Head is normocephalic and atraumatic. EOMI are intact. Mouth is well hydrated and without lesions. NECK: Supple. No masses LUNGS: Clear to auscultation. No presence of rhonchi/wheezing/rales. Adequate chest expansion HEART: RRR, normal s1 and s2. ABDOMEN: Soft, nontender, no guarding, no peritoneal signs, and nondistended. BS +. No masses. EXTREMITIES: Without any cyanosis, clubbing, rash, lesions or edema. NEUROLOGIC: AOx3, no focal motor deficit. SKIN: no jaundice, no rashes  Assessment/Plan Jessica Strong is a 77 y.o. female with past medical history of obesity status post Roux-en-Y gastric bypass, anxiety, depression, diabetes, hyperlipidemia, hypertension, myocardial infarction, stroke, coming for evaluation of abdominal pain.  Will proceed with esophagogastroduodenospy.  Dolores Frame, MD 12/08/2022, 10:06 AM

## 2022-12-08 NOTE — Discharge Instructions (Signed)
You are being discharged to home.  Resume your previous diet.  We are waiting for your pathology results.  

## 2022-12-08 NOTE — Op Note (Signed)
[facility] Patient Name: Jessica Strong Procedure Date: 12/08/2022 10:04 AM MRN: [id number] Date of Birth: February 05, 1946 Attending MD: Katrinka Blazing , , [id number] CSN: [id number] Age: 77 Admit Type: Outpatient Procedure:                Upper GI endoscopy Indications:              Epigastric abdominal pain Providers:                Katrinka Blazing, Crystal Page, Pandora Leiter,                            Technician Referring MD:              Medicines:                Monitored Anesthesia Care Complications:            No immediate complications. Estimated Blood Loss:     Estimated blood loss: none. Procedure:                Pre-Anesthesia Assessment:                           - ASA Grade Assessment: II - A patient with mild                            systemic disease.                           After obtaining informed consent, the endoscope was                            passed under direct vision. Throughout the                            procedure, the patient's blood pressure, pulse, and                            oxygen saturations were monitored continuously. The                            GIF-H190 ([id number]) scope was introduced through the                            mouth, and advanced to the second part of duodenum.                            The upper GI endoscopy was accomplished without                            difficulty. The patient tolerated the procedure                            well. Scope In: 10:19:31 AM Scope Out: 10:22:46 AM Total Procedure Duration: 0 hours 3 minutes 15 seconds  Findings:      The examined esophagus was normal.      The entire examined stomach was normal. Biopsies  were taken with a cold       forceps for Helicobacter pylori testing. Verification of patient       identification for the specimen was done.      Diffuse mild inflammation characterized by congestion (edema) and       erythema was found in the first portion of the  duodenum. Impression:               - Normal esophagus.                           - Normal stomach. Biopsied.                           - Duodenitis. Moderate Sedation:      Per Anesthesia Care Recommendation:           - Discharge patient to home (ambulatory).                           - Resume previous diet.                           - Await pathology results.                           - If normal biopsies, start omeprazole 40 mg qday Procedure Code(s):        --- Professional ---                           [id number], Esophagogastroduodenoscopy, flexible,                            transoral; with biopsy, single or multiple Diagnosis Code(s):        --- Professional ---                           K29.80, Duodenitis without bleeding                           R10.13, Epigastric pain CPT copyright 2022 [employer]. All rights reserved. The codes documented in this report are preliminary and upon coder review may  be revised to meet current compliance requirements. Katrinka Blazing, MD Katrinka Blazing,  12/08/2022 10:30:13 AM This report has been signed electronically. Number of Addenda: 0

## 2022-12-08 NOTE — Anesthesia Postprocedure Evaluation (Signed)
Anesthesia Post Note  Patient: Lemma M Worden  Procedure(s) Performed: ESOPHAGOGASTRODUODENOSCOPY (EGD) WITH PROPOFOL BIOPSY  Patient location during evaluation: Short Stay Anesthesia Type: General Level of consciousness: awake and alert Pain management: pain level controlled Vital Signs Assessment: post-procedure vital signs reviewed and stable Respiratory status: spontaneous breathing Cardiovascular status: blood pressure returned to baseline and stable Postop Assessment: no apparent nausea or vomiting Anesthetic complications: no   No notable events documented.   Last Vitals:  Vitals:   12/08/22 0929  BP: (!) 117/53  Pulse: 62  Resp: 20  Temp: 36.7 C  SpO2: 99%    Last Pain:  Vitals:   12/08/22 1013  TempSrc:   PainSc: 0-No pain                 ,

## 2022-12-08 NOTE — Anesthesia Preprocedure Evaluation (Addendum)
Anesthesia Evaluation  Patient identified by MRN, date of birth, ID band Patient awake    Reviewed: Allergy & Precautions, H&P , NPO status , Patient's Chart, lab work & pertinent test results  Airway Mallampati: III  TM Distance: >3 FB Neck ROM: Full    Dental  (+) Dental Advisory Given, Missing   Pulmonary shortness of breath and with exertion, asthma , sleep apnea and Continuous Positive Airway Pressure Ventilation    Pulmonary exam normal breath sounds clear to auscultation       Cardiovascular Exercise Tolerance: Good hypertension, Pt. on medications + Past MI  Normal cardiovascular exam Rhythm:Regular Rate:Normal  16-Nov-2022 13:44:39 [facility] ROUTINE RECORD [birth year] (77 yr) Female Black Vent. rate 70 BPM PR interval 192 ms QRS duration 68 ms QT/QTcB 376/406 ms P-R-T axes 63 16 -16 Normal sinus rhythm Low voltage QRS Nonspecific T wave abnormality Abnormal ECG When compared with ECG of 15-Sep-2020 09:33, No significant change since last tracing Confirmed by Carolan Clines ([id number]) on 11/16/2022 2:05:46 PM   Neuro/Psych  PSYCHIATRIC DISORDERS Anxiety Depression    CVA, Residual Symptoms    GI/Hepatic negative GI ROS, Neg liver ROS,,,  Endo/Other  diabetes, Well Controlled, Type 2, Oral Hypoglycemic AgentsHypothyroidism  Morbid obesity  Renal/GU negative Renal ROS  negative genitourinary   Musculoskeletal  (+) Arthritis , Osteoarthritis,    Abdominal   Peds negative pediatric ROS (+)  Hematology  (+) Blood dyscrasia, anemia   Anesthesia Other Findings   Reproductive/Obstetrics negative OB ROS                             Anesthesia Physical Anesthesia Plan  ASA: 3  Anesthesia Plan: General   Post-op Pain Management: Minimal or no pain anticipated   Induction: Intravenous  PONV Risk Score and Plan: 1 and Propofol infusion  Airway Management  Planned: Nasal Cannula and Natural Airway  Additional Equipment:   Intra-op Plan:   Post-operative Plan:   Informed Consent: I have reviewed the patients History and Physical, chart, labs and discussed the procedure including the risks, benefits and alternatives for the proposed anesthesia with the patient or authorized representative who has indicated his/her understanding and acceptance.     Dental advisory given  Plan Discussed with: CRNA and Surgeon  Anesthesia Plan Comments:         Anesthesia Quick Evaluation

## 2022-12-08 NOTE — Transfer of Care (Signed)
Immediate Anesthesia Transfer of Care Note  Patient: Jessica Strong  Procedure(s) Performed: ESOPHAGOGASTRODUODENOSCOPY (EGD) WITH PROPOFOL BIOPSY  Patient Location: Short Stay  Anesthesia Type:General  Level of Consciousness: awake  Airway & Oxygen Therapy: Patient Spontanous Breathing  Post-op Assessment: Report given to RN  Post vital signs: Reviewed and stable  Last Vitals:  Vitals Value Taken Time  BP    Temp    Pulse    Resp    SpO2      Last Pain:  Vitals:   12/08/22 1013  TempSrc:   PainSc: 0-No pain         Complications: No notable events documented.

## 2022-12-11 ENCOUNTER — Ambulatory Visit: Payer: Medicare Other | Admitting: Nurse Practitioner

## 2022-12-11 ENCOUNTER — Encounter: Payer: Self-pay | Admitting: Nurse Practitioner

## 2022-12-11 VITALS — BP 136/69 | HR 66 | Ht 60.0 in | Wt 275.2 lb

## 2022-12-11 DIAGNOSIS — Z794 Long term (current) use of insulin: Secondary | ICD-10-CM | POA: Diagnosis not present

## 2022-12-11 DIAGNOSIS — E1122 Type 2 diabetes mellitus with diabetic chronic kidney disease: Secondary | ICD-10-CM

## 2022-12-11 DIAGNOSIS — Z808 Family history of malignant neoplasm of other organs or systems: Secondary | ICD-10-CM | POA: Diagnosis not present

## 2022-12-11 DIAGNOSIS — N1832 Chronic kidney disease, stage 3b: Secondary | ICD-10-CM | POA: Diagnosis not present

## 2022-12-11 DIAGNOSIS — E039 Hypothyroidism, unspecified: Secondary | ICD-10-CM

## 2022-12-11 DIAGNOSIS — E782 Mixed hyperlipidemia: Secondary | ICD-10-CM

## 2022-12-11 DIAGNOSIS — Z7985 Long-term (current) use of injectable non-insulin antidiabetic drugs: Secondary | ICD-10-CM

## 2022-12-11 DIAGNOSIS — I10 Essential (primary) hypertension: Secondary | ICD-10-CM | POA: Diagnosis not present

## 2022-12-11 LAB — POCT GLYCOSYLATED HEMOGLOBIN (HGB A1C): Hemoglobin A1C: 8.8 % — AB (ref 4.0–5.6)

## 2022-12-11 MED ORDER — TRESIBA FLEXTOUCH 200 UNIT/ML ~~LOC~~ SOPN: 55.0000 [IU] | PEN_INJECTOR | Freq: Every day | SUBCUTANEOUS | 3 refills | Status: DC

## 2022-12-11 MED ORDER — LEVOTHYROXINE SODIUM 88 MCG PO TABS: 88.0000 ug | ORAL_TABLET | Freq: Every day | ORAL | 1 refills | Status: DC

## 2022-12-11 MED ORDER — HUMALOG KWIKPEN 100 UNIT/ML ~~LOC~~ SOPN: 8.0000 [IU] | PEN_INJECTOR | Freq: Three times a day (TID) | SUBCUTANEOUS | Status: DC

## 2022-12-11 NOTE — Progress Notes (Signed)
Endocrinology Follow Up Note       12/11/2022, 3:15 PM   Subjective:    Patient ID: Jessica Strong, female    DOB: 1945-11-09.  Jessica Strong is being seen in follow up after being seen in consultation for management of currently uncontrolled symptomatic diabetes requested by  Gilmore Laroche, FNP.   Past Medical History:  Diagnosis Date   Allergy    Anemia    Anxiety    Arthritis    Asthma    Cataract    cervical ca 1990   Depression    Diabetes (HCC)    Dyspnea    Hypercholesterolemia    Hypertension    Hypothyroidism    Myocardial infarction (HCC) 2005   Sleep apnea    on CPAP 14 mm PS since February 2017   Stroke [facility]) 2007   weakness on R side   Vitamin D deficiency     Past Surgical History:  Procedure Laterality Date   ABDOMINAL HYSTERECTOMY     BARIATRIC SURGERY     BIOPSY  09/17/2020   Procedure: BIOPSY;  Surgeon: Dolores Frame, MD;  Location: [facility];  Service: Gastroenterology;;   BREAST BIOPSY Right    beningn   CARDIAC CATHETERIZATION N/A 12/24/2015   Procedure: Right/Left Heart Cath and Coronary Angiography;  Surgeon: Corky Crafts, MD;  Location: [facility] INVASIVE CV LAB;  Service: Cardiovascular;  Laterality: N/A;   COLONOSCOPY  2018   COLONOSCOPY WITH PROPOFOL N/A 09/17/2020   Procedure: COLONOSCOPY WITH PROPOFOL;  Surgeon: Dolores Frame, MD;  Location: [facility];  Service: Gastroenterology;  Laterality: N/A;  9:00   ESOPHAGOGASTRODUODENOSCOPY (EGD) WITH PROPOFOL N/A 09/17/2020   Procedure: ESOPHAGOGASTRODUODENOSCOPY (EGD) WITH PROPOFOL;  Surgeon: Dolores Frame, MD;  Location: [facility];  Service: Gastroenterology;  Laterality: N/A;   HERNIA REPAIR     LEFT HEART CATH AND CORONARY ANGIOGRAPHY N/A 07/12/2017   Procedure: LEFT HEART CATH AND CORONARY ANGIOGRAPHY;  Surgeon: Lyn Records, MD;  Location: [facility] INVASIVE CV LAB;   Service: Cardiovascular;  Laterality: N/A;    Social History   Socioeconomic History   Marital status: Married    Spouse name: Not on file   Number of children: 2   Years of education: Not on file   Highest education level: Not on file  Occupational History   Occupation: retired  Tobacco Use   Smoking status: Never    Passive exposure: Never   Smokeless tobacco: Never  Vaping Use   Vaping status: Never Used  Substance and Sexual Activity   Alcohol use: Not Currently    Comment: occasionally   Drug use: No   Sexual activity: Never    Partners: Male  Other Topics Concern   Not on file  Social History Narrative   Not on file   Social Determinants of Health   Financial Resource Strain: Low Risk  (11/30/2022)   Overall Financial Resource Strain (CARDIA)    Difficulty of Paying Living Expenses: Not hard at all  Food Insecurity: No Food Insecurity (11/30/2022)   Hunger Vital Sign    Worried About Running Out of Food in the Last Year: Never true  Ran Out of Food in the Last Year: Never true  Transportation Needs: No Transportation Needs (11/30/2022)   PRAPARE - Administrator, Civil Service (Medical): No    Lack of Transportation (Non-Medical): No  Physical Activity: Inactive (11/30/2022)   Exercise Vital Sign    Days of Exercise per Week: 0 days    Minutes of Exercise per Session: 0 min  Stress: No Stress Concern Present (11/30/2022)   [employer] of Occupational Health - Occupational Stress Questionnaire    Feeling of Stress : Not at all  Social Connections: Moderately Isolated (11/30/2022)   Social Connection and Isolation Panel [NHANES]    Frequency of Communication with Friends and Family: More than three times a week    Frequency of Social Gatherings with Friends and Family: More than three times a week    Attends Religious Services: Never    Database administrator or Organizations: No    Attends Engineer, structural: Never    Marital Status:  Married    Family History  Problem Relation Age of Onset   Cancer Mother    Colon cancer Mother    Diabetes Mother    Hypertension Father    Thyroid disease Father    Thyroid cancer Father    Diabetes Father    Esophageal cancer Father    Breast cancer Maternal Aunt    Colon cancer Maternal Aunt    Breast cancer Maternal Aunt    Colon cancer Maternal Grandmother    Stroke Maternal Grandfather    Stomach cancer Paternal Grandfather    Colon cancer Son    Colon polyps Neg Hx    Rectal cancer Neg Hx     Outpatient Encounter Medications as of 12/11/2022  Medication Sig   ACCU-CHEK AVIVA PLUS test strip Inject 1 strip as directed daily.   acetaminophen (TYLENOL) 500 MG tablet Take 500 mg by mouth every 6 (six) hours as needed for mild pain or headache. As needed.   albuterol (VENTOLIN HFA) 108 (90 Base) MCG/ACT inhaler Inhale 2 puffs into the lungs every 6 (six) hours as needed for wheezing or shortness of breath.   aspirin 325 MG tablet Take 325 mg by mouth daily.   bisacodyl (DULCOLAX) 5 MG EC tablet Take 5 mg by mouth. One every other day   Calcium Carb-Cholecalciferol (CALCIUM 600-D PO) Take 1 tablet by mouth daily.   EPINEPHrine 0.3 mg/0.3 mL IJ SOAJ injection Inject 0.3 mg into the muscle as needed for anaphylaxis.   ferrous sulfate 325 (65 FE) MG tablet Take 325 mg by mouth daily with breakfast.   furosemide (LASIX) 20 MG tablet TAKE 1 TABLET BY MOUTH DAILY.  (MAY TAKE EXTRA 1 TABLET AS  NEEDED FOR SEVERE SWELLING)   Lancets Thin MISC Inject 1 Stick into the skin daily.   Multiple Vitamins-Minerals (MULTIVITAMIN ADULT) CHEW Chew 1 tablet by mouth daily at 12 noon.   NOVOFINE PEN NEEDLE 32G X 6 MM MISC Use to inject insulin 4 times daily   olmesartan (BENICAR) 20 MG tablet Take 1 tablet (20 mg total) by mouth daily.   potassium chloride (KLOR-CON M) 10 MEQ tablet Take 1 tablet (10 mEq total) by mouth daily.   simvastatin (ZOCOR) 20 MG tablet TAKE 1 TABLET BY MOUTH AT  BEDTIME    terbinafine (LAMISIL AT) 1 % cream Apply 1 Application topically 2 (two) times daily.   tirzepatide (MOUNJARO) 12.5 MG/0.5ML Pen Inject 12.5 mg into the skin once a week.   [  DISCONTINUED] HUMALOG KWIKPEN 100 UNIT/ML KwikPen Inject 5-8 Units into the skin 3 (three) times daily.   [DISCONTINUED] levothyroxine (SYNTHROID) 75 MCG tablet Take 1 tablet (75 mcg total) by mouth daily before breakfast.   [DISCONTINUED] TRESIBA FLEXTOUCH 200 UNIT/ML FlexTouch Pen Inject 56 Units into the skin at bedtime.   HUMALOG KWIKPEN 100 UNIT/ML KwikPen Inject 8-11 Units into the skin 3 (three) times daily.   levothyroxine (SYNTHROID) 88 MCG tablet Take 1 tablet (88 mcg total) by mouth daily before breakfast.   lubiprostone (AMITIZA) 8 MCG capsule Take 1 capsule (8 mcg total) by mouth 2 (two) times daily with a meal. (Patient not taking: Reported on 11/30/2022)   TRESIBA FLEXTOUCH 200 UNIT/ML FlexTouch Pen Inject 56 Units into the skin at bedtime.   No facility-administered encounter medications on file as of 12/11/2022.    ALLERGIES: Allergies  Allergen Reactions   Corn Oil Rash   Other Rash    PT STATES SHE IS ALLERGIC TO MOST FOODS-STATES IT WAS FOUND IN ALLERGY TESTING   Peanut Allergen Powder-Dnfp Rash   Peanut-Containing Drug Products Rash   Penicillins Rash    Has patient had a PCN reaction causing immediate rash, facial/tongue/throat swelling, SOB or lightheadedness with hypotension: Yes Has patient had a PCN reaction causing severe rash involving mucus membranes or skin necrosis: No Has patient had a PCN reaction that required hospitalization No Has patient had a PCN reaction occurring within the last 10 years: No. Has not had in 40-50 years If all of the above answers are "NO", then may proceed with Cephalosporin use.    VACCINATION STATUS: Immunization History  Administered Date(s) Administered   Moderna Sars-Covid-2 Vaccination 07/11/2019, 08/08/2019, 08/11/2020   PNEUMOCOCCAL CONJUGATE-20  02/21/2022   Zoster Recombinant(Shingrix) 08/17/2022, 10/02/2022    Diabetes She presents for her follow-up diabetic visit. She has type 2 diabetes mellitus. Onset time: Diagnosed at approx age of 30. Her disease course has been stable. There are no hypoglycemic associated symptoms. Pertinent negatives for hypoglycemia include no sweats or tremors. Associated symptoms include fatigue, foot paresthesias and weight loss. Pertinent negatives for diabetes include no polydipsia and no polyuria. There are no hypoglycemic complications. Symptoms are improving. Diabetic complications include a CVA, heart disease, nephropathy and peripheral neuropathy. Risk factors for coronary artery disease include diabetes mellitus, dyslipidemia, family history, obesity, hypertension, post-menopausal and sedentary lifestyle. Current diabetic treatment includes intensive insulin program and oral agent (monotherapy). She is compliant with treatment most of the time. Her weight is decreasing steadily. She is following a generally healthy diet. When asked about meal planning, she reported none. She has not had a previous visit with a dietitian. She rarely participates in exercise. Her home blood glucose trend is fluctuating minimally. Her breakfast blood glucose range is generally 140-180 mg/dl. Her overall blood glucose range is >200 mg/dl. (She presents today, accompanied by her husband, with her CGM showing at target fasting and above target postprandial glycemic profile.  Her POCT A1c today is 8.8%, increasing from last visit of 8.1%.  She notes she had to miss several doses of her Mounjaro due to a stomach procedure.  Analysis of her CGM shows TIR 18%, TAR 82%, TBR 0% with a GMI of 8.7%.  ) An ACE inhibitor/angiotensin II receptor blocker is being taken. She does not see a podiatrist.Eye exam is current.  Hypertension This is a chronic problem. The current episode started more than 1 year ago. The problem has been resolved since  onset. The problem is controlled. Pertinent  negatives include no sweats. Agents associated with hypertension include thyroid hormones. Risk factors for coronary artery disease include sedentary lifestyle, obesity, post-menopausal state, dyslipidemia, diabetes mellitus and family history. Past treatments include angiotensin blockers and diuretics. The current treatment provides moderate improvement. Compliance problems include diet and exercise.  Hypertensive end-organ damage includes kidney disease, CAD/MI and CVA. Identifiable causes of hypertension include a thyroid problem.     Review of systems  Constitutional: + stable body weight, current Body mass index is 53.75 kg/m., + fatigue, no subjective hyperthermia, no subjective hypothermia Eyes: no blurry vision, no xerophthalmia ENT: no sore throat, no nodules palpated in throat, no dysphagia/odynophagia, no hoarseness Cardiovascular: no chest pain, no shortness of breath, no palpitations, + leg swelling Respiratory: no cough, + shortness of breath with exertion Gastrointestinal: no nausea/vomiting/diarrhea Musculoskeletal: no muscle/joint aches, right side weakness from previous CVA, complains of right leg pain Skin: no rashes, no hyperemia Neurological: no tremors, + numbness to left arm and left leg, no dizziness Psychiatric: + significant depression, no anxiety  Objective:     BP 136/69 (BP Location: Left Arm, Patient Position: Sitting, Cuff Size: Large)   Pulse 66   Ht 5' (1.524 m)   Wt 275 lb 3.2 oz (124.8 kg)   BMI 53.75 kg/m   Wt Readings from Last 3 Encounters:  12/11/22 275 lb 3.2 oz (124.8 kg)  12/08/22 275 lb (124.7 kg)  11/30/22 275 lb (124.7 kg)     BP Readings from Last 3 Encounters:  12/11/22 136/69  12/08/22 129/62  11/27/22 138/86     Physical Exam- Limited  Constitutional:  Body mass index is 53.75 kg/m. , not in acute distress, normal state of mind Eyes:  EOMI, no exophthalmos Musculoskeletal: minimal  right sided weakness- residual from previous stroke Skin:  no rashes, no hyperemia Neurological: no tremor with outstretched hands  Diabetic Foot Exam - Simple   No data filed     CMP ( most recent) CMP     Component Value Date/Time   NA 141 09/06/2022 1148   K 5.0 09/06/2022 1148   CL 104 09/06/2022 1148   CO2 20 09/06/2022 1148   GLUCOSE 146 (H) 09/06/2022 1148   GLUCOSE 172 (H) 09/15/2020 1037   BUN 26 09/06/2022 1148   CREATININE 2.01 (H) 09/06/2022 1148   CALCIUM 9.3 09/06/2022 1148   PROT 6.8 09/06/2022 1148   ALBUMIN 4.1 09/06/2022 1148   AST 15 09/06/2022 1148   ALT 10 09/06/2022 1148   ALKPHOS 145 (H) 09/06/2022 1148   BILITOT 0.2 09/06/2022 1148   GFRNONAA 32 (L) 09/15/2020 1037   GFRAA 48 (L) 07/09/2017 1112     Diabetic Labs (most recent): Lab Results  Component Value Date   HGBA1C 8.8 (A) 12/11/2022   HGBA1C 8.0 (A) 05/08/2022   HGBA1C 9.1 (A) 02/02/2022   MICROALBUR 150 07/11/2021     Lipid Panel ( most recent) Lipid Panel     Component Value Date/Time   CHOL 125 09/06/2022 1148   TRIG 164 (H) 09/06/2022 1148   HDL 45 09/06/2022 1148   CHOLHDL 2.8 09/06/2022 1148   LDLCALC 52 09/06/2022 1148   LABVLDL 28 09/06/2022 1148      Lab Results  Component Value Date   TSH 3.020 11/27/2022   TSH 3.330 09/06/2022   TSH 3.880 04/17/2022   TSH 5.750 (H) 11/04/2021   TSH 1.533 09/29/2020   FREET4 1.29 11/27/2022   FREET4 1.23 09/06/2022   FREET4 1.50 04/17/2022   FREET4  1.06 11/04/2021           Assessment & Plan:   1) Type 2 diabetes mellitus with stage 3b chronic kidney disease, with long-term current use of insulin (HCC)  She presents today, accompanied by her husband, with her CGM showing at target fasting and above target postprandial glycemic profile.  Her POCT A1c today is 8.8%, increasing from last visit of 8.1%.  She notes she had to miss several doses of her Mounjaro due to a stomach procedure.  Analysis of her CGM shows TIR 18%,  TAR 82%, TBR 0% with a GMI of 8.7%.    - Jessica Ledvina has currently uncontrolled symptomatic type 2 DM since 77 years of age.   -Recent labs reviewed.  - I had a long discussion with her about the progressive nature of diabetes and the pathology behind its complications. -her diabetes is complicated by CVA, MI, CAD, OSA, CKD and she remains at a high risk for more acute and chronic complications which include CAD, CVA, CKD, retinopathy, and neuropathy. These are all discussed in detail with her.  The following Lifestyle Medicine recommendations according to American College of Lifestyle Medicine [facility]) were discussed and offered to patient and she agrees to start the journey:  A. Whole Foods, Plant-based plate comprising of fruits and vegetables, plant-based proteins, whole-grain carbohydrates was discussed in detail with the patient.   A list for source of those nutrients were also provided to the patient.  Patient will use only water or unsweetened tea for hydration. B.  The need to stay away from risky substances including alcohol, smoking; obtaining 7 to 9 hours of restorative sleep, at least 150 minutes of moderate intensity exercise weekly, the importance of healthy social connections,  and stress reduction techniques were discussed. C.  A full color page of  Calorie density of various food groups per pound showing examples of each food groups was provided to the patient.  - Nutritional counseling repeated at each appointment due to patients tendency to fall back in to old habits.  - The patient admits there is a room for improvement in their diet and drink choices. -  Suggestion is made for the patient to avoid simple carbohydrates from their diet including Cakes, Sweet Desserts / Pastries, Ice Cream, Soda (diet and regular), Sweet Tea, Candies, Chips, Cookies, Sweet Pastries, Store Bought Juices, Alcohol in Excess of 1-2 drinks a day, Artificial Sweeteners, Coffee Creamer, and  "Sugar-free" Products. This will help patient to have stable blood glucose profile and potentially avoid unintended weight gain.   - I encouraged the patient to switch to unprocessed or minimally processed complex starch and increased protein intake (animal or plant source), fruits, and vegetables.   - Patient is advised to stick to a routine mealtimes to eat 3 meals a day and avoid unnecessary snacks (to snack only to correct hypoglycemia).  - she will be scheduled with Norm Salt, RDN, CDE for diabetes education.  - I have approached her with the following individualized plan to manage her diabetes and patient agrees:   -She is advised to continue her Tresiba 55 units SQ nightly, adjust her Humalog to 8-11 units TID with meals if glucose is above 90 and she is eating (Specific instructions on how to titrate insulin dosage based on glucose readings given to patient in writing) and continue Mounjaro 12.5 mg SQ weekly (will hold off on increasing due to medication shortage).   -she is encouraged to continue monitoring glucose 4 times  daily (using her CGM), before meals and before bed, and to call the clinic if she has readings less than 70 or above 300 for 3 tests in a row.    - she is warned not to take insulin without proper monitoring per orders. - Adjustment parameters are given to her for hypo and hyperglycemia in writing.  - her Metformin was discontinued, risk outweighs benefit for this patient given recent renal decline.  - Specific targets for  A1c; LDL, HDL, and Triglycerides were discussed with the patient.  2) Blood Pressure /Hypertension:  her blood pressure is controlled to target for her age.   she is advised to continue her current medications including Benicar 20 mg po daily, Lasix 20 mg po daily as needed.  3) Lipids/Hyperlipidemia:    Her most recent lipid panel from 09/06/22 shows controlled LDL of 52.  She is advised to continue Zocor 20 mg po daily at bedtime.  Labs  ordered by PCP.  4)  Weight/Diet:  her Body mass index is 53.75 kg/m.  -  clearly complicating her diabetes care.   she is a candidate for weight loss. I discussed with her the fact that loss of 5 - 10% of her  current body weight will have the most impact on her diabetes management.  Exercise, and detailed carbohydrates information provided  -  detailed on discharge instructions.  5) Hypothyroidism Her previsit TFTs are consistent with slight under-replacement.  She is advised to continue increase her Levothyroxine to 88 mcg po daily before breakfast.  - The correct intake of thyroid hormone (Levothyroxine, Synthroid), is on empty stomach first thing in the morning, with water, separated by at least 30 minutes from breakfast and other medications,  and separated by more than 4 hours from calcium, iron, multivitamins, acid reflux medications (PPIs).  - This medication is a life-long medication and will be needed to correct thyroid hormone imbalances for the rest of your life.  The dose may change from time to time, based on thyroid blood work.  - It is extremely important to be consistent taking this medication, near the same time each morning.  -AVOID TAKING PRODUCTS CONTAINING BIOTIN (commonly found in Hair, Skin, Nails vitamins) AS IT INTERFERES WITH THE VALIDITY OF THYROID FUNCTION BLOOD TESTS.  6) Chronic Care/Health Maintenance: -she is on ACEI/ARB and not on Statin medications and is encouraged to initiate and continue to follow up with Ophthalmology, Dentist, Podiatrist at least yearly or according to recommendations, and advised to stay away from smoking. I have recommended yearly flu vaccine and pneumonia vaccine at least every 5 years; moderate intensity exercise for up to 150 minutes weekly; and sleep for at least 7 hours a day.  - she is advised to maintain close follow up with Gilmore Laroche, FNP for primary care needs, as well as her other providers for optimal and coordinated  care.      I spent  36  minutes in the care of the patient today including review of labs from CMP, Lipids, Thyroid Function, Hematology (current and previous including abstractions from other facilities); face-to-face time discussing  her blood glucose readings/logs, discussing hypoglycemia and hyperglycemia episodes and symptoms, medications doses, her options of short and long term treatment based on the latest standards of care / guidelines;  discussion about incorporating lifestyle medicine;  and documenting the encounter. Risk reduction counseling performed per USPSTF guidelines to reduce obesity and cardiovascular risk factors.     Please refer to Patient Instructions for  Blood Glucose Monitoring and Insulin/Medications Dosing Guide"  in media tab for additional information. Please  also refer to " Patient Self Inventory" in the Media  tab for reviewed elements of pertinent patient history.  [name] [facility] participated in the discussions, expressed understanding, and voiced agreement with the above plans.  All questions were answered to her satisfaction. she is encouraged to contact clinic should she have any questions or concerns prior to her return visit.     Follow up plan: - Return in about 3 months (around 03/13/2023) for Diabetes F/U with A1c in office, Thyroid follow up, Previsit labs, Bring meter and logs.   Ronny Bacon, [facility] [address] [place], [name] [address] Phone: [phone] Fax: [phone]  12/11/2022, 3:15 PM

## 2022-12-13 ENCOUNTER — Other Ambulatory Visit (INDEPENDENT_AMBULATORY_CARE_PROVIDER_SITE_OTHER): Payer: Self-pay | Admitting: Gastroenterology

## 2022-12-13 ENCOUNTER — Encounter (HOSPITAL_COMMUNITY): Payer: Self-pay | Admitting: Gastroenterology

## 2022-12-13 DIAGNOSIS — R109 Unspecified abdominal pain: Secondary | ICD-10-CM

## 2022-12-13 DIAGNOSIS — K2970 Gastritis, unspecified, without bleeding: Secondary | ICD-10-CM

## 2022-12-13 MED ORDER — OMEPRAZOLE 40 MG PO CPDR: 40.0000 mg | DELAYED_RELEASE_CAPSULE | Freq: Every day | ORAL | 3 refills | Status: DC

## 2022-12-14 ENCOUNTER — Encounter (INDEPENDENT_AMBULATORY_CARE_PROVIDER_SITE_OTHER): Payer: Self-pay | Admitting: *Deleted

## 2022-12-18 ENCOUNTER — Ambulatory Visit: Payer: Medicare Other | Admitting: Urology

## 2022-12-18 ENCOUNTER — Encounter: Payer: Self-pay | Admitting: Urology

## 2022-12-18 VITALS — BP 155/67 | HR 80

## 2022-12-18 DIAGNOSIS — N3281 Overactive bladder: Secondary | ICD-10-CM | POA: Diagnosis not present

## 2022-12-18 LAB — URINALYSIS, ROUTINE W REFLEX MICROSCOPIC
Bilirubin, UA: NEGATIVE
Glucose, UA: NEGATIVE
Ketones, UA: NEGATIVE
Leukocytes,UA: NEGATIVE
Nitrite, UA: NEGATIVE
Protein,UA: NEGATIVE
RBC, UA: NEGATIVE
Specific Gravity, UA: 1.025 (ref 1.005–1.030)
Urobilinogen, Ur: 0.2 mg/dL (ref 0.2–1.0)
pH, UA: 6 (ref 5.0–7.5)

## 2022-12-18 NOTE — Progress Notes (Unsigned)
12/18/2022 2:28 PM   Koleen Nimrod MAXIE COLVERT 20-Jul-1945 [id number]  Referring provider: Gilmore Laroche, FNP [address] [place],  [name] [address]  No chief complaint on file.   HPI:  F/u -   1) urinary frequency-she has frequency and urgency. She has incontinence. She wears pads and goes through several per day and night. She s/p caldera sling on 01/05/2018 with Dr. Gala Lewandowsky. No change in incontinence. Maybe worse. She leaks with urgency. Not so much with stress. No gross hematuria. She tried Myrbetriq. 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 She does have constipation.  CT abdomen and pelvis March 2023 benign GU tract.  Punctate left renal stone.    PVR was 5 ml. Cystoscopy benign Jul 2023.She tried [place] which worked well for a week but then was less than effective. Standing triggers. She c/o urgency and leakage.    She underwent Jul 2023 UDS - pvr 50, C 100 ml, sens 55, desire 86. Unstable with severe leakage. No SUI. Normal voiding. She tried tolterodine which seemed to work for a few weeks and then stopped.    She started combination therapy. Noc improved but still has continual leakage without awareness in the day. When she needs to go to the bathroom she is wet.    She underwent botox injection 100 U x 20 sites 06/16/2022. LUTS and incontinence improved. Pad usage has gone down. Noc improved.   Today, seen for the above. Her frequency and nocturia has returned. Incontinence worse.   UA clear.   PMH: Past Medical History:  Diagnosis Date   Allergy    Anemia    Anxiety    Arthritis    Asthma    Cataract    cervical ca 1990   Depression    Diabetes (HCC)    Dyspnea    Hypercholesterolemia    Hypertension    Hypothyroidism    Myocardial infarction (HCC) 2005   Sleep apnea    on CPAP 14 mm PS since February 2017   Stroke [facility]) 2007   weakness on R side   Vitamin D deficiency     Surgical History: Past Surgical History:  Procedure Laterality Date   ABDOMINAL HYSTERECTOMY      BARIATRIC SURGERY     BIOPSY  09/17/2020   Procedure: BIOPSY;  Surgeon: Dolores Frame, MD;  [facility];  Service: Gastroenterology;;   BIOPSY  12/08/2022   Procedure: BIOPSY;  Surgeon: Dolores Frame, MD;  [facility]: [facility];  Service: Gastroenterology;;   BREAST BIOPSY Right    beningn   CARDIAC CATHETERIZATION N/A 12/24/2015   Procedure: Right/Left Heart Cath and Coronary Angiography;  Surgeon: Corky Crafts, MD;  Location: [facility] INVASIVE CV LAB;  Service: Cardiovascular;  Laterality: N/A;   COLONOSCOPY  2018   COLONOSCOPY WITH PROPOFOL N/A 09/17/2020   Procedure: COLONOSCOPY WITH PROPOFOL;  Surgeon: Dolores Frame, MD;  [facility];  Service: Gastroenterology;  Laterality: N/A;  9:00   ESOPHAGOGASTRODUODENOSCOPY (EGD) WITH PROPOFOL N/A 09/17/2020   Procedure: ESOPHAGOGASTRODUODENOSCOPY (EGD) WITH PROPOFOL;  Surgeon: Dolores Frame, MD;  [facility];  Service: Gastroenterology;  Laterality: N/A;   ESOPHAGOGASTRODUODENOSCOPY (EGD) WITH PROPOFOL N/A 12/08/2022   Procedure: ESOPHAGOGASTRODUODENOSCOPY (EGD) WITH PROPOFOL;  Surgeon: Dolores Frame, MD;  [facility]: [facility];  Service: Gastroenterology;  Laterality: N/A;  2:30PM;ASA 3   HERNIA REPAIR     LEFT HEART CATH AND CORONARY ANGIOGRAPHY N/A 07/12/2017   Procedure: LEFT  HEART CATH AND CORONARY ANGIOGRAPHY;  Surgeon: Lyn Records, MD;  Location: [facility] INVASIVE CV LAB;  Service: Cardiovascular;  Laterality: N/A;    Home Medications:  Allergies as of 12/18/2022       Reactions   Corn Oil Rash   Other Rash   PT STATES SHE IS ALLERGIC TO MOST FOODS-STATES IT WAS FOUND IN ALLERGY TESTING   Peanut Allergen Powder-dnfp Rash   Peanut-containing Drug Products Rash   Penicillins Rash   Has patient had a PCN reaction causing immediate rash, facial/tongue/throat swelling, SOB or lightheadedness with hypotension: Yes Has patient had a PCN  reaction causing severe rash involving mucus membranes or skin necrosis: No Has patient had a PCN reaction that required hospitalization No Has patient had a PCN reaction occurring within the last 10 years: No. Has not had in 40-50 years If all of the above answers are "NO", then may proceed with Cephalosporin use.        Medication List        Accurate as of December 18, 2022  2:28 PM. If you have any questions, ask your nurse or doctor.          Accu-Chek Aviva Plus test strip Generic drug: glucose blood Inject 1 strip as directed daily.   acetaminophen 500 MG tablet Commonly known as: TYLENOL Take 500 mg by mouth every 6 (six) hours as needed for mild pain or headache. As needed.   albuterol 108 (90 Base) MCG/ACT inhaler Commonly known as: VENTOLIN HFA Inhale 2 puffs into the lungs every 6 (six) hours as needed for wheezing or shortness of breath.   aspirin 325 MG tablet Take 325 mg by mouth daily.   bisacodyl 5 MG EC tablet Commonly known as: DULCOLAX Take 5 mg by mouth. One every other day   CALCIUM 600-D PO Take 1 tablet by mouth daily.   EPINEPHrine 0.3 mg/0.3 mL Soaj injection Commonly known as: EPI-PEN Inject 0.3 mg into the muscle as needed for anaphylaxis.   ferrous sulfate 325 (65 FE) MG tablet Take 325 mg by mouth daily with breakfast.   furosemide 20 MG tablet Commonly known as: LASIX TAKE 1 TABLET BY MOUTH DAILY.  (MAY TAKE EXTRA 1 TABLET AS  NEEDED FOR SEVERE SWELLING)   HumaLOG KwikPen 100 UNIT/ML KwikPen Generic drug: insulin lispro Inject 8-11 Units into the skin 3 (three) times daily.   Lancets Thin Misc Inject 1 Stick into the skin daily.   levothyroxine 88 MCG tablet Commonly known as: SYNTHROID Take 1 tablet (88 mcg total) by mouth daily before breakfast.   lubiprostone 8 MCG capsule Commonly known as: Amitiza Take 1 capsule (8 mcg total) by mouth 2 (two) times daily with a meal.   Multivitamin Adult Chew Chew 1 tablet by mouth  daily at 12 noon.   Novofine Pen Needle 32G X 6 MM Misc Generic drug: Insulin Pen Needle Use to inject insulin 4 times daily   olmesartan 20 MG tablet Commonly known as: BENICAR Take 1 tablet (20 mg total) by mouth daily.   omeprazole 40 MG capsule Commonly known as: PRILOSEC Take 1 capsule (40 mg total) by mouth daily.   potassium chloride 10 MEQ tablet Commonly known as: KLOR-CON M Take 1 tablet (10 mEq total) by mouth daily.   simvastatin 20 MG tablet Commonly known as: ZOCOR TAKE 1 TABLET BY MOUTH AT  BEDTIME   terbinafine 1 % cream Commonly known as: LamISIL AT Apply 1 Application topically 2 (two) times daily.  tirzepatide 12.5 MG/0.5ML Pen Commonly known as: MOUNJARO Inject 12.5 mg into the skin once a week.   Evaristo Bury FlexTouch 200 UNIT/ML FlexTouch Pen Generic drug: insulin degludec Inject 56 Units into the skin at bedtime.        Allergies:  Allergies  Allergen Reactions   Corn Oil Rash   Other Rash    PT STATES SHE IS ALLERGIC TO MOST FOODS-STATES IT WAS FOUND IN ALLERGY TESTING   Peanut Allergen Powder-Dnfp Rash   Peanut-Containing Drug Products Rash   Penicillins Rash    Has patient had a PCN reaction causing immediate rash, facial/tongue/throat swelling, SOB or lightheadedness with hypotension: Yes Has patient had a PCN reaction causing severe rash involving mucus membranes or skin necrosis: No Has patient had a PCN reaction that required hospitalization No Has patient had a PCN reaction occurring within the last 10 years: No. Has not had in 40-50 years If all of the above answers are "NO", then may proceed with Cephalosporin use.    Family History: Family History  Problem Relation Age of Onset   Cancer Mother    Colon cancer Mother    Diabetes Mother    Hypertension Father    Thyroid disease Father    Thyroid cancer Father    Diabetes Father    Esophageal cancer Father    Breast cancer Maternal Aunt    Colon cancer Maternal Aunt     Breast cancer Maternal Aunt    Colon cancer Maternal Grandmother    Stroke Maternal Grandfather    Stomach cancer Paternal Grandfather    Colon cancer Son    Colon polyps Neg Hx    Rectal cancer Neg Hx     Social History:  reports that she has never smoked. She has never been exposed to tobacco smoke. She has never used smokeless tobacco. She reports that she does not currently use alcohol. She reports that she does not use drugs.   Physical Exam: BP (!) 155/67   Pulse 80   Constitutional:  Alert and oriented, No acute distress. HEENT: [place] AT, moist mucus membranes.  Trachea midline, no masses. Cardiovascular: No clubbing, cyanosis, or edema. Respiratory: Normal respiratory effort, no increased work of breathing. GI: Abdomen is soft, nontender, nondistended, no abdominal masses GU: No CVA tenderness Skin: No rashes, bruises or suspicious lesions. Neurologic: Grossly intact, no focal deficits, moving all 4 extremities. Psychiatric: Normal mood and affect.  Laboratory Data: Lab Results  Component Value Date   WBC 8.8 09/06/2022   HGB 11.2 09/06/2022   HCT 35.7 09/06/2022   MCV 81 09/06/2022   PLT 298 09/06/2022    Lab Results  Component Value Date   CREATININE 2.01 (H) 09/06/2022    No results found for: "PSA"  No results found for: "TESTOSTERONE"  Lab Results  Component Value Date   HGBA1C 8.8 (A) 12/11/2022    Urinalysis    Component Value Date/Time   APPEARANCEUR Clear 07/03/2022 1120   GLUCOSEU Negative 07/03/2022 1120   BILIRUBINUR Negative 07/03/2022 1120   PROTEINUR Negative 07/03/2022 1120   UROBILINOGEN 0.2 03/06/2022 1416   NITRITE Negative 07/03/2022 1120   LEUKOCYTESUR Negative 07/03/2022 1120    Lab Results  Component Value Date   LABMICR Comment 07/03/2022   WBCUA 0-5 10/03/2021   LABEPIT 0-10 10/03/2021   BACTERIA Few 10/03/2021    Pertinent Imaging: N/a    Assessment & Plan:    1. Overactive bladder Discussed repeat botox and  she will proceed.   -  Urinalysis, Routine w reflex microscopic   No follow-ups on file.  Jerilee Field, MD  [facility]  [address] [place], [name] [address] [phone]

## 2022-12-22 ENCOUNTER — Other Ambulatory Visit: Payer: Self-pay | Admitting: Family Medicine

## 2022-12-22 DIAGNOSIS — I10 Essential (primary) hypertension: Secondary | ICD-10-CM

## 2023-02-05 ENCOUNTER — Encounter: Payer: Self-pay | Admitting: Urology

## 2023-02-05 ENCOUNTER — Ambulatory Visit: Payer: Medicare Other | Admitting: Urology

## 2023-02-05 VITALS — BP 148/74 | HR 83

## 2023-02-05 DIAGNOSIS — N3281 Overactive bladder: Secondary | ICD-10-CM

## 2023-02-05 MED ORDER — LIDOCAINE HCL 1 % IJ SOLN
30.0000 mL | Freq: Once | INTRAMUSCULAR | Status: AC
Start: 2023-02-05 — End: 2023-02-05
  Administered 2023-02-05: 30 mL

## 2023-02-05 MED ORDER — ONABOTULINUMTOXINA 100 UNITS IJ SOLR
100.0000 [IU] | Freq: Once | INTRAMUSCULAR | Status: AC
Start: 2023-02-05 — End: 2023-02-05
  Administered 2023-02-05: 100 [IU] via INTRAMUSCULAR

## 2023-02-05 MED ORDER — CIPROFLOXACIN HCL 500 MG PO TABS
500.0000 mg | ORAL_TABLET | Freq: Once | ORAL | Status: AC
Start: 2023-02-05 — End: 2023-02-05
  Administered 2023-02-05: 500 mg via ORAL

## 2023-02-05 NOTE — Progress Notes (Unsigned)
   02/05/23  CC: No chief complaint on file.   HPI:  Here for BOTOX. Doing well. No dysuria.   Blood pressure (!) 148/74, pulse 83. NED. A&Ox3.   No respiratory distress   Abd soft, NT, ND Normal external genitalia with patent urethral meatus  Cystoscopy Procedure Note  Patient identification was confirmed, informed consent was obtained, and patient was prepped using Betadine solution.  Lidocaine jelly was administered per urethral meatus.  Lidocaine was instilled in bladder x 30 min.   Procedure: - Flexible cystoscope introduced, without any difficulty.   - Thorough search of the bladder revealed:    normal urethral meatus    normal urothelium    no stones    no ulcers     no tumors    no urethral polyps    no trabeculation -Botox 100 Units in 10 ml was injected in usual grid fashion 1 ml x 10 injections.    - Ureteral orifices were normal in position and appearance.  Post-Procedure: - Patient tolerated the procedure well  Assessment/ Plan:  F/u 2 weeks for PVR and then in 3 months    No follow-ups on file.  Jerilee Field, MD

## 2023-02-06 LAB — URINALYSIS, ROUTINE W REFLEX MICROSCOPIC
Bilirubin, UA: NEGATIVE
Glucose, UA: NEGATIVE
Ketones, UA: NEGATIVE
Leukocytes,UA: NEGATIVE
Nitrite, UA: NEGATIVE
Protein,UA: NEGATIVE
RBC, UA: NEGATIVE
Specific Gravity, UA: 1.025 (ref 1.005–1.030)
Urobilinogen, Ur: 0.2 mg/dL (ref 0.2–1.0)
pH, UA: 5.5 (ref 5.0–7.5)

## 2023-02-12 NOTE — Progress Notes (Signed)
Name: Jessica Strong DOB: March 19, 1946 MRN: [id number]  History of Present Illness: Ms. Mang is a 77 y.o. female who presents today for follow up visit at [facility] [place]. - GU history: 1. OAB with urinary frequency, nocturia, urgency, and urge incontinence. - July 2023: Urodynamic testing showed"pvr 50, C 100 ml, sens 55, desire 86. Unstable with severe leakage. No SUI. Normal voiding." - Previously failed Tolterodine, Myrbetriq, & Gemtesa (including combo therapy). - 06/16/2022: Underwent bladder Botox procedure (100 units) with good symptomatic improvement. 2. Stress urinary incontinence (resolved). - 01/05/2018: Alene Mires mid-urethral sling placement by Dr. Gala Lewandowsky. 3. Kidney stone(s).  At last visit with Dr. Mena Goes on 02/05/2023: Office cystoscopy with bladder Botox procedure (100 units).  Today: She denies any symptomatic improvement since the bladder Botox procedure - she reports that her symptoms are actually worse now. Denies acute UTI symptoms such as fever, dysuria, gross hematuria, flank pain, or abdominal pain.  She reports that she is having significantly bothersome urinary frequency, urgency, and urge incontinence. Voiding as often as every 15 minutes and reports she is leaking constantly; using multiple pads every day.  She denies any increase in caffeine intake (1 cup of coffee daily).  She continues to take Lasix every morning for BLE edema.  She states that her blood sugar has been improving over the past few months.    Fall Screening: Do you usually have a device to assist in your mobility? No   Medications: Current Outpatient Medications  Medication Sig Dispense Refill   ACCU-CHEK AVIVA PLUS test strip Inject 1 strip as directed daily.  4   acetaminophen (TYLENOL) 500 MG tablet Take 500 mg by mouth every 6 (six) hours as needed for mild pain or headache. As needed.     albuterol (VENTOLIN HFA) 108 (90 Base) MCG/ACT inhaler Inhale 2 puffs  into the lungs every 6 (six) hours as needed for wheezing or shortness of breath. 8 g 2   aspirin 325 MG tablet Take 325 mg by mouth daily.     bisacodyl (DULCOLAX) 5 MG EC tablet Take 5 mg by mouth. One every other day     Calcium Carb-Cholecalciferol (CALCIUM 600-D PO) Take 1 tablet by mouth daily.     EPINEPHrine 0.3 mg/0.3 mL IJ SOAJ injection Inject 0.3 mg into the muscle as needed for anaphylaxis.     ferrous sulfate 325 (65 FE) MG tablet Take 325 mg by mouth daily with breakfast.     furosemide (LASIX) 20 MG tablet TAKE 1 TABLET BY MOUTH DAILY.  (MAY TAKE EXTRA 1 TABLET AS  NEEDED FOR SEVERE SWELLING) 167 tablet 3   HUMALOG KWIKPEN 100 UNIT/ML KwikPen Inject 8-11 Units into the skin 3 (three) times daily.     Lancets Thin MISC Inject 1 Stick into the skin daily.     levothyroxine (SYNTHROID) 88 MCG tablet TAKE 1 TABLET BY MOUTH DAILY  BEFORE BREAKFAST 100 tablet 2   lubiprostone (AMITIZA) 8 MCG capsule Take 1 capsule (8 mcg total) by mouth 2 (two) times daily with a meal. 60 capsule 1   Multiple Vitamins-Minerals (MULTIVITAMIN ADULT) CHEW Chew 1 tablet by mouth daily at 12 noon.     NOVOFINE PEN NEEDLE 32G X 6 MM MISC Use to inject insulin 4 times daily 200 each 2   olmesartan (BENICAR) 20 MG tablet TAKE 1 TABLET BY MOUTH DAILY 100 tablet 2   omeprazole (PRILOSEC) 40 MG capsule Take 1 capsule (40 mg total) by mouth daily. 90  capsule 3   potassium chloride (KLOR-CON M) 10 MEQ tablet Take 1 tablet (10 mEq total) by mouth daily. 60 tablet 5   simvastatin (ZOCOR) 20 MG tablet TAKE 1 TABLET BY MOUTH AT  BEDTIME 90 tablet 3   terbinafine (LAMISIL AT) 1 % cream Apply 1 Application topically 2 (two) times daily. 30 g 0   tirzepatide (MOUNJARO) 12.5 MG/0.5ML Pen Inject 12.5 mg into the skin once a week. 6 mL 1   TRESIBA FLEXTOUCH 200 UNIT/ML FlexTouch Pen Inject 56 Units into the skin at bedtime. 45 mL 3   Vibegron (GEMTESA) 75 MG TABS Take 1 tablet (75 mg total) by mouth daily.     No current  facility-administered medications for this visit.    Allergies: Allergies  Allergen Reactions   Corn Oil Rash   Other Rash    PT STATES SHE IS ALLERGIC TO MOST FOODS-STATES IT WAS FOUND IN ALLERGY TESTING   Peanut Allergen Powder-Dnfp Rash   Peanut-Containing Drug Products Rash   Penicillins Rash    Has patient had a PCN reaction causing immediate rash, facial/tongue/throat swelling, SOB or lightheadedness with hypotension: Yes Has patient had a PCN reaction causing severe rash involving mucus membranes or skin necrosis: No Has patient had a PCN reaction that required hospitalization No Has patient had a PCN reaction occurring within the last 10 years: No. Has not had in 40-50 years If all of the above answers are "NO", then may proceed with Cephalosporin use.    Past Medical History:  Diagnosis Date   Allergy    Anemia    Anxiety    Arthritis    Asthma    Cataract    cervical ca 1990   Depression    Diabetes (HCC)    Dyspnea    Hypercholesterolemia    Hypertension    Hypothyroidism    Myocardial infarction (HCC) 2005   Sleep apnea    on CPAP 14 mm PS since February 2017   Stroke [facility]) 2007   weakness on R side   Vitamin D deficiency    Past Surgical History:  Procedure Laterality Date   ABDOMINAL HYSTERECTOMY     BARIATRIC SURGERY     BIOPSY  09/17/2020   Procedure: BIOPSY;  Surgeon: Dolores Frame, MD;  Location: [facility];  Service: Gastroenterology;;   BIOPSY  12/08/2022   Procedure: BIOPSY;  Surgeon: Dolores Frame, MD;  Location: [facility];  Service: Gastroenterology;;   BREAST BIOPSY Right    beningn   CARDIAC CATHETERIZATION N/A 12/24/2015   Procedure: Right/Left Heart Cath and Coronary Angiography;  Surgeon: Corky Crafts, MD;  Location: [facility] INVASIVE CV LAB;  Service: Cardiovascular;  Laterality: N/A;   COLONOSCOPY  2018   COLONOSCOPY WITH PROPOFOL N/A 09/17/2020   Procedure: COLONOSCOPY WITH PROPOFOL;  Surgeon:  Dolores Frame, MD;  Location: [facility];  Service: Gastroenterology;  Laterality: N/A;  9:00   ESOPHAGOGASTRODUODENOSCOPY (EGD) WITH PROPOFOL N/A 09/17/2020   Procedure: ESOPHAGOGASTRODUODENOSCOPY (EGD) WITH PROPOFOL;  Surgeon: Dolores Frame, MD;  Location: [facility];  Service: Gastroenterology;  Laterality: N/A;   ESOPHAGOGASTRODUODENOSCOPY (EGD) WITH PROPOFOL N/A 12/08/2022   Procedure: ESOPHAGOGASTRODUODENOSCOPY (EGD) WITH PROPOFOL;  Surgeon: Dolores Frame, MD;  Location: [facility];  Service: Gastroenterology;  Laterality: N/A;  2:30PM;ASA 3   HERNIA REPAIR     LEFT HEART CATH AND CORONARY ANGIOGRAPHY N/A 07/12/2017   Procedure: LEFT HEART CATH AND CORONARY ANGIOGRAPHY;  Surgeon: Lyn Records,  MD;  Location: [facility] INVASIVE CV LAB;  Service: Cardiovascular;  Laterality: N/A;   Family History  Problem Relation Age of Onset   Cancer Mother    Colon cancer Mother    Diabetes Mother    Hypertension Father    Thyroid disease Father    Thyroid cancer Father    Diabetes Father    Esophageal cancer Father    Breast cancer Maternal Aunt    Colon cancer Maternal Aunt    Breast cancer Maternal Aunt    Colon cancer Maternal Grandmother    Stroke Maternal Grandfather    Stomach cancer Paternal Grandfather    Colon cancer Son    Colon polyps Neg Hx    Rectal cancer Neg Hx    Social History   Socioeconomic History   Marital status: Married    Spouse name: Not on file   Number of children: 2   Years of education: Not on file   Highest education level: Not on file  Occupational History   Occupation: retired  Tobacco Use   Smoking status: Never    Passive exposure: Never   Smokeless tobacco: Never  Vaping Use   Vaping status: Never Used  Substance and Sexual Activity   Alcohol use: Not Currently    Comment: occasionally   Drug use: No   Sexual activity: Never    Partners: Male  Other Topics Concern   Not on file  Social History  Narrative   Not on file   Social Determinants of Health   Financial Resource Strain: Low Risk  (11/30/2022)   Overall Financial Resource Strain (CARDIA)    Difficulty of Paying Living Expenses: Not hard at all  Food Insecurity: No Food Insecurity (11/30/2022)   Hunger Vital Sign    Worried About Running Out of Food in the Last Year: Never true    Ran Out of Food in the Last Year: Never true  Transportation Needs: No Transportation Needs (11/30/2022)   PRAPARE - Administrator, Civil Service (Medical): No    Lack of Transportation (Non-Medical): No  Physical Activity: Inactive (11/30/2022)   Exercise Vital Sign    Days of Exercise per Week: 0 days    Minutes of Exercise per Session: 0 min  Stress: No Stress Concern Present (11/30/2022)   [employer] of Occupational Health - Occupational Stress Questionnaire    Feeling of Stress : Not at all  Social Connections: Moderately Isolated (11/30/2022)   Social Connection and Isolation Panel [NHANES]    Frequency of Communication with Friends and Family: More than three times a week    Frequency of Social Gatherings with Friends and Family: More than three times a week    Attends Religious Services: Never    Database administrator or Organizations: No    Attends Banker Meetings: Never    Marital Status: Married  Catering manager Violence: Not At Risk (11/30/2022)   Humiliation, Afraid, Rape, and Kick questionnaire    Fear of Current or Ex-Partner: No    Emotionally Abused: No    Physically Abused: No    Sexually Abused: No    Review of Systems Constitutional: Patient denies any unintentional weight loss or change in strength lntegumentary: Patient denies any rashes or pruritus Cardiovascular: Patient denies chest pain or syncope Respiratory: Patient denies shortness of breath Gastrointestinal: Patient denies nausea, vomiting, constipation, or diarrhea Musculoskeletal: Patient denies muscle cramps or  weakness Neurologic: Patient denies convulsions or seizures Allergic/Immunologic: Patient denies  recent allergic reaction(s) Hematologic/Lymphatic: Patient denies bleeding tendencies Endocrine: Patient denies heat/cold intolerance  GU: As per HPI.  OBJECTIVE Vitals:   02/19/23 1430  BP: (!) 145/79  Pulse: 74  Temp: 98.2 F (36.8 C)   There is no height or weight on file to calculate BMI.  Physical Examination Constitutional: No obvious distress; patient is non-toxic appearing  Cardiovascular: No visible lower extremity edema.  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  - We discussed her neurogenic risk factors for OAB-type symptoms including T2DM & prior stroke and that her symptoms are likely exacerbated by diuretic use.  - She is already minimizing caffeine intake. - Previously failed anticholinergic medication and isn't a safe candidate to restart those based on age / comorbidities / potential side effects. - Discussed option to restart a beta-3 agonist medication on top of the recent Botox, which she elected to try.  - We discussed that she is not a good candidate for PTNS due to peripheral neuropathy and BLE edema.  Discussed only remaining treatment options are likely either sacral neuromodulation trial (if eligible based on comorbidities and hemoglobin A1c re: anticipated postop  wound healing) or SP tube placement.   Will plan for follow up in 6 weeks for recheck. Pt verbalized understanding and agreement. All questions were answered.  PLAN Advised the following: 1. Start Gemtesa 75 mg daily (samples given). 2. Continue minimal intake. 3. Continue routine use of CPAP for OSA.  4. Continue working on blood sugar management. 5. Return in about 6 weeks (around 04/02/2023) for UA, PVR, & f/u with Evette Georges NP.  Orders Placed This Encounter  Procedures   Urinalysis, Routine w reflex microscopic   BLADDER SCAN AMB NON-IMAGING    It has been explained that the patient is to follow regularly with their PCP in addition to all other providers involved in their care and to follow instructions provided by these respective offices. Patient advised to contact urology clinic if any urologic-pertaining questions, concerns, new symptoms or problems arise in the interim period.  Patient Instructions     Overactive bladder (OAB) overview for patients:  Symptoms may include: urinary urgency ("gotta go" feeling) urinary frequency (voiding >8 times per day) night time urination (nocturia) urge incontinence of urine (UUI)  While we do not know the exact etiology of OAB, several treatment options exist including:  Behavioral therapy: Reducing fluid intake Decreasing bladder stimulants (such as caffeine) and irritants (such as acidic food, spicy foods, alcohol) Urge suppression strategies Bladder retraining via timed voiding  Pelvic floor physical therapy  Medication(s) - can use one or both of the drug classes below. Anticholinergic / antimuscarinic medications:  Mechanism of action: Activate M3 receptors to reduce detrusor stimulation and increase bladder capacity   (parasympathetic nervous system). Effect: Relaxes the bladder to decrease overactivity, increase bladder storage capacity, and increase time between voids. Onset: Slow acting (may take 8-12 weeks to  determine efficacy). Medications include: Vesicare (Solifenacin), Ditropan (Oxybutynin), Detrol (Tolterodine), Toviaz (Fesoterodine), Sanctura (Trospium), Urispas (Flavoxate), Enablex (  Darifenacin), Bentyl (Dicyclomine), Levsin (Hyoscyamine ). Potential side effects include but are not limited to: Dry eyes, dry mouth, constipation, cognitive impairment, dementia risk with long term use, and urinary retention/ incomplete bladder emptying. Insurance companies generally prefer for patients to try 1-2 anticholinergic / antimuscarinic medications first due to low cost. Some exceptions are made based on patient-specific comorbidities / risk factors. Beta-3 agonist medications: Mechanism of action: Stimulates selective B3 adrenergic receptors to cause smooth muscle bladder relaxation (sympathetic nervous system). Effect: Relaxes the bladder to decrease overactivity, increase bladder storage capacity, and increase time between voids. Onset: Slow acting (may take 8-12 weeks to determine efficacy). Medications include: Myrbetriq (Mirabegron) and Vibegron Leslye Peer). Potential side effects include but are not limited to: urinary retention / incomplete bladder emptying and elevated blood pressure (more likely to occur in individuals with pre-existing uncontrolled hypertension). These medications tend to be more expensive than the anticholinergic / antimuscarinic medications.   For patients with refractory OAB (if the above treatment options have been unsuccessful): Posterior tibial nerve stimulation (PTNS). Small acupuncture-type needle inserted near ankle with electric current to stimulate bladder via posterior tibial nerve pathway. Initially requires 12 weekly in-office treatments lasting 30 minutes each; followed by monthly in-office treatments lasting 30 minutes each for 1 year.  Bladder Botox injections. How it is done: Typically done via in-office cystoscopy; sometimes done in the OR depending on the  situation. The bladder is numbed with lidocaine instilled via a catheter. Then the urologist injects Botox into the bladder muscle wall in about 20 locations. Causes local paralysis of the bladder muscle at the injection sites to reduce bladder muscle overactivity / spasms. The effect lasts for approximately 6 months and cannot be reversed once performed. Risks may included but are not limited to: infection, incomplete bladder emptying/ urinary retention, short term need for self-catheterization or indwelling catheter, and need for repeat therapy. There is a 5-12% chance of needing to catheterize with Botox - that usually resolves in a few months as the Botox wears off. Typically Botox injections would need to be repeated every 3-12 months since this is not a permanent therapy.  Sacral neuromodulation trial (Medtronic lnterStim or Axonics implant). Sacral neuromodulation is FDA-approved for uncontrolled urinary urgency, urinary frequency, urinary urge incontinence, non-obstructive urinary retention, or fecal incontinence. It is not FDA-approved as a treatment for pain. The goal of this therapy is at least a 50% improvement in symptoms. It is NOT realistic to expect a 100% cure. This is a a 2-step outpatient procedure. After a successful test period, a permanent wire and generator are placed in the OR. We discussed the risk of infection. We reviewed the fact that about 30% of patients fail the test phase and are not candidates for permanent generator placement. During the 1-2 week trial phase, symptoms are documented by the patient to determine response. If patient gets at least a 50% improvement in symptoms, they may then proceed with Step 2. Step 1: Trial lead placement. Per physician discretion, may done one of two ways: Percutaneous nerve evaluation (PNE) in the [facility] urology office. Performed by urologist under local anesthesia (numbing the area with lidocaine) using a spinal needle for placement of  test wire, which usually stays in place for 5-7 days to determine therapy response. Test lead placement in OR under anesthesia. Usually stays in place 2 weeks to determine therapy response. > Step 2: Permanent implantation of sacral neuromodulation device, which is performed in the OR.  Sacral neuromodulation implants: All are conditionally MRI  safe. Manufacturer: Medtronic Website: [employer]gl therapy/right-for-you.html Options: lnterStim X: Non-rechargeable. The battery lasts 10 years on average. lnterStim Micro: Rechargeable. The battery lasts 15 years on average and must be charged routinely. Approximately 50% smaller implant than lnterStim X implant.  Manufacturer: Axonics Website: Findrealrelief.axonics.com Options: Non-rechargeable (Axonics F15): The battery lasts 15 years on average. Rechargeable (Axonics R20): The battery lasts 20 years on average and must be charged in office for about 1 hour every 6-10 months on average. Approximately 50% smaller implant than Axonics non-rechargeable implant.  Note: Generally the rechargeable devices are only advised for very small or thin patients who may not have sufficient adipose tissue to comfortably overlay the implanted device.  Suprapubic catheter (SP tube) placement. Only done in severely refractory OAB when all other options have failed or are not a viable treatment choice depending on patient factors. Involves placement of a catheter through the lower abdomen into the bladder to continuously drain the bladder into an external collection bag, which patient can then empty at their convenience every few hours. Done via an outpatient surgical procedure in the OR under anesthesia. Risks may included but are not limited to: surgical site pain, infections, skin irritation / breakdown, chronic bacteriuria, symptomatic UTls. The SP tube must stay in place  continuously. This is a reversible procedure however - the insertion site will close if catheter is removed for more than a few hours. The SP tube must be exchanged routinely every 4 weeks to prevent the catheter from becoming clogged with sediment. SP tube exchanges are typically performed at a urology nurse visit or by a home health nurse.   Electronically signed by:  Donnita Falls, FNP   02/19/23    3:06 PM

## 2023-02-18 ENCOUNTER — Other Ambulatory Visit: Payer: Self-pay | Admitting: Nurse Practitioner

## 2023-02-19 ENCOUNTER — Encounter: Payer: Self-pay | Admitting: Urology

## 2023-02-19 ENCOUNTER — Ambulatory Visit: Payer: Medicare Other | Admitting: Urology

## 2023-02-19 VITALS — BP 145/79 | HR 74 | Temp 98.2°F

## 2023-02-19 DIAGNOSIS — N3281 Overactive bladder: Secondary | ICD-10-CM | POA: Diagnosis not present

## 2023-02-19 LAB — URINALYSIS, ROUTINE W REFLEX MICROSCOPIC
Bilirubin, UA: NEGATIVE
Glucose, UA: NEGATIVE
Ketones, UA: NEGATIVE
Leukocytes,UA: NEGATIVE
Nitrite, UA: NEGATIVE
Protein,UA: NEGATIVE
RBC, UA: NEGATIVE
Specific Gravity, UA: 1.025 (ref 1.005–1.030)
Urobilinogen, Ur: 0.2 mg/dL (ref 0.2–1.0)
pH, UA: 6 (ref 5.0–7.5)

## 2023-02-19 LAB — BLADDER SCAN AMB NON-IMAGING: Scan Result: 43

## 2023-02-19 MED ORDER — GEMTESA 75 MG PO TABS: 1.0000 | ORAL_TABLET | Freq: Every day | ORAL | Status: DC

## 2023-02-19 NOTE — Patient Instructions (Signed)
Overactive bladder (OAB) overview for patients:  Symptoms may include: urinary urgency ("gotta go" feeling) urinary frequency (voiding >8 times per day) night time urination (nocturia) urge incontinence of urine (UUI)  While we do not know the exact etiology of OAB, several treatment options exist including:  Behavioral therapy: Reducing fluid intake Decreasing bladder stimulants (such as caffeine) and irritants (such as acidic food, spicy foods, alcohol) Urge suppression strategies Bladder retraining via timed voiding  Pelvic floor physical therapy  Medication(s) - can use one or both of the drug classes below. Anticholinergic / antimuscarinic medications:  Mechanism of action: Activate M3 receptors to reduce detrusor stimulation and increase bladder capacity  (parasympathetic nervous system). Effect: Relaxes the bladder to decrease overactivity, increase bladder storage capacity, and increase time between voids. Onset: Slow acting (may take 8-12 weeks to determine efficacy). Medications include: Vesicare (Solifenacin), Ditropan (Oxybutynin), Detrol (Tolterodine), Toviaz (Fesoterodine), Sanctura (Trospium), Urispas (Flavoxate), Enablex (Darifenacin), Bentyl (Dicyclomine), Levsin (Hyoscyamine ). Potential side effects include but are not limited to: Dry eyes, dry mouth, constipation, cognitive impairment, dementia risk with long term use, and urinary retention/ incomplete bladder emptying. Insurance companies generally prefer for patients to try 1-2 anticholinergic / antimuscarinic medications first due to low cost. Some exceptions are made based on patient-specific comorbidities / risk factors. Beta-3 agonist medications: Mechanism of action: Stimulates selective B3 adrenergic receptors to cause smooth muscle bladder relaxation (sympathetic nervous system). Effect: Relaxes the bladder to decrease overactivity, increase bladder storage capacity, and increase time  between voids. Onset: Slow acting (may take 8-12 weeks to determine efficacy). Medications include: Myrbetriq (Mirabegron) and Vibegron Leslye Peer). Potential side effects include but are not limited to: urinary retention / incomplete bladder emptying and elevated blood pressure (more likely to occur in individuals with pre-existing uncontrolled hypertension). These medications tend to be more expensive than the anticholinergic / antimuscarinic medications.   For patients with refractory OAB (if the above treatment options have been unsuccessful): Posterior tibial nerve stimulation (PTNS). Small acupuncture-type needle inserted near ankle with electric current to stimulate bladder via posterior tibial nerve pathway. Initially requires 12 weekly in-office treatments lasting 30 minutes each; followed by monthly in-office treatments lasting 30 minutes each for 1 year.  Bladder Botox injections. How it is done: Typically done via in-office cystoscopy; sometimes done in the OR depending on the situation. The bladder is numbed with lidocaine instilled via a catheter. Then the urologist injects Botox into the bladder muscle wall in about 20 locations. Causes local paralysis of the bladder muscle at the injection sites to reduce bladder muscle overactivity / spasms. The effect lasts for approximately 6 months and cannot be reversed once performed. Risks may included but are not limited to: infection, incomplete bladder emptying/ urinary retention, short term need for self-catheterization or indwelling catheter, and need for repeat therapy. There is a 5-12% chance of needing to catheterize with Botox - that usually resolves in a few months as the Botox wears off. Typically Botox injections would need to be repeated every 3-12 months since this is not a permanent therapy.  Sacral neuromodulation trial (Medtronic lnterStim or Axonics implant). Sacral neuromodulation is FDA-approved for uncontrolled urinary  urgency, urinary frequency, urinary urge incontinence, non-obstructive urinary retention, or fecal incontinence. It is not FDA-approved as a treatment for pain. The goal of this therapy is at least a 50% improvement in symptoms. It is NOT realistic to expect a 100% cure. This is a a 2-step outpatient procedure. After a successful  test period, a permanent wire and generator are placed in the OR. We discussed the risk of infection. We reviewed the fact that about 30% of patients fail the test phase and are not candidates for permanent generator placement. During the 1-2 week trial phase, symptoms are documented by the patient to determine response. If patient gets at least a 50% improvement in symptoms, they may then proceed with Step 2. Step 1: Trial lead placement. Per physician discretion, may done one of two ways: Percutaneous nerve evaluation (PNE) in the [facility] urology office. Performed by urologist under local anesthesia (numbing the area with lidocaine) using a spinal needle for placement of test wire, which usually stays in place for 5-7 days to determine therapy response. Test lead placement in OR under anesthesia. Usually stays in place 2 weeks to determine therapy response. > Step 2: Permanent implantation of sacral neuromodulation device, which is performed in the OR.  Sacral neuromodulation implants: All are conditionally MRI safe. Manufacturer: Medtronic Website: [employer].gl therapy/right-for-you.html Options: lnterStim X: Non-rechargeable. The battery lasts 10 years on average. lnterStim Micro: Rechargeable. The battery lasts 15 years on average and must be charged routinely. Approximately 50% smaller implant than lnterStim X implant.  Manufacturer: Axonics Website: Findrealrelief.axonics.com Options: Non-rechargeable (Axonics F15): The battery lasts 15 years on average. Rechargeable (Axonics R20):  The battery lasts 20 years on average and must be charged in office for about 1 hour every 6-10 months on average. Approximately 50% smaller implant than Axonics non-rechargeable implant.  Note: Generally the rechargeable devices are only advised for very small or thin patients who may not have sufficient adipose tissue to comfortably overlay the implanted device.  Suprapubic catheter (SP tube) placement. Only done in severely refractory OAB when all other options have failed or are not a viable treatment choice depending on patient factors. Involves placement of a catheter through the lower abdomen into the bladder to continuously drain the bladder into an external collection bag, which patient can then empty at their convenience every few hours. Done via an outpatient surgical procedure in the OR under anesthesia. Risks may included but are not limited to: surgical site pain, infections, skin irritation / breakdown, chronic bacteriuria, symptomatic UTls. The SP tube must stay in place continuously. This is a reversible procedure however - the insertion site will close if catheter is removed for more than a few hours. The SP tube must be exchanged routinely every 4 weeks to prevent the catheter from becoming clogged with sediment. SP tube exchanges are typically performed at a urology nurse visit or by a home health nurse.

## 2023-02-22 LAB — TSH: TSH: 1.83 u[IU]/mL (ref 0.450–4.500)

## 2023-02-22 LAB — T4, FREE: Free T4: 1.35 ng/dL (ref 0.82–1.77)

## 2023-02-27 ENCOUNTER — Telehealth: Payer: Self-pay | Admitting: Nurse Practitioner

## 2023-02-27 ENCOUNTER — Other Ambulatory Visit: Payer: Self-pay

## 2023-02-27 NOTE — Telephone Encounter (Signed)
Pt needs prescription for the Dexcom g7 Sensors needs to be called into [facility].

## 2023-02-27 NOTE — Telephone Encounter (Signed)
Spoke with [facility]. They state that they have calling pt concerning refills with no answer or response. Notified pt and she states she will call them today.

## 2023-03-05 ENCOUNTER — Ambulatory Visit (INDEPENDENT_AMBULATORY_CARE_PROVIDER_SITE_OTHER): Payer: Medicare Other | Admitting: Family Medicine

## 2023-03-05 ENCOUNTER — Encounter: Payer: Self-pay | Admitting: Family Medicine

## 2023-03-05 VITALS — BP 138/75 | HR 70 | Wt 278.0 lb

## 2023-03-05 DIAGNOSIS — E038 Other specified hypothyroidism: Secondary | ICD-10-CM | POA: Diagnosis not present

## 2023-03-05 DIAGNOSIS — E1100 Type 2 diabetes mellitus with hyperosmolarity without nonketotic hyperglycemic-hyperosmolar coma (NKHHC): Secondary | ICD-10-CM

## 2023-03-05 DIAGNOSIS — I10 Essential (primary) hypertension: Secondary | ICD-10-CM

## 2023-03-05 DIAGNOSIS — E7849 Other hyperlipidemia: Secondary | ICD-10-CM

## 2023-03-05 DIAGNOSIS — Z794 Long term (current) use of insulin: Secondary | ICD-10-CM

## 2023-03-05 DIAGNOSIS — E559 Vitamin D deficiency, unspecified: Secondary | ICD-10-CM

## 2023-03-05 NOTE — Progress Notes (Unsigned)
Established Patient Office Visit  Subjective:  Patient ID: Jessica Strong, female    DOB: 08/20/1945  Age: 77 y.o. MRN: [id number]  CC:  Chief Complaint  Patient presents with   Care Management    3 month f/u, has questions about thyroid medications. Had labs done to recheck would like this reviewed.     HPI Jessica Strong is a 77 y.o. female with past medical history of type 2 diabetes, hypothyroidism and hypertension presents for f/u of  chronic medical conditions. For the details of today's visit, please refer to the assessment and plan.    Past Medical History:  Diagnosis Date   Allergy    Anemia    Anxiety    Arthritis    Asthma    Cataract    cervical ca 1990   Depression    Diabetes (HCC)    Dyspnea    Hypercholesterolemia    Hypertension    Hypothyroidism    Myocardial infarction (HCC) 2005   Sleep apnea    on CPAP 14 mm PS since February 2017   Stroke [facility]) 2007   weakness on R side   Vitamin D deficiency     Past Surgical History:  Procedure Laterality Date   ABDOMINAL HYSTERECTOMY     BARIATRIC SURGERY     BIOPSY  09/17/2020   Procedure: BIOPSY;  Surgeon: Dolores Frame, MD;  Location: [facility];  Service: Gastroenterology;;   BIOPSY  12/08/2022   Procedure: BIOPSY;  Surgeon: Dolores Frame, MD;  Location: [facility];  Service: Gastroenterology;;   BREAST BIOPSY Right    beningn   CARDIAC CATHETERIZATION N/A 12/24/2015   Procedure: Right/Left Heart Cath and Coronary Angiography;  Surgeon: Corky Crafts, MD;  Location: [facility] INVASIVE CV LAB;  Service: Cardiovascular;  Laterality: N/A;   COLONOSCOPY  2018   COLONOSCOPY WITH PROPOFOL N/A 09/17/2020   Procedure: COLONOSCOPY WITH PROPOFOL;  Surgeon: Dolores Frame, MD;  Location: [facility];  Service: Gastroenterology;  Laterality: N/A;  9:00   ESOPHAGOGASTRODUODENOSCOPY (EGD) WITH PROPOFOL N/A 09/17/2020   Procedure: ESOPHAGOGASTRODUODENOSCOPY (EGD) WITH  PROPOFOL;  Surgeon: Dolores Frame, MD;  Location: [facility];  Service: Gastroenterology;  Laterality: N/A;   ESOPHAGOGASTRODUODENOSCOPY (EGD) WITH PROPOFOL N/A 12/08/2022   Procedure: ESOPHAGOGASTRODUODENOSCOPY (EGD) WITH PROPOFOL;  Surgeon: Dolores Frame, MD;  Location: [facility];  Service: Gastroenterology;  Laterality: N/A;  2:30PM;ASA 3   HERNIA REPAIR     LEFT HEART CATH AND CORONARY ANGIOGRAPHY N/A 07/12/2017   Procedure: LEFT HEART CATH AND CORONARY ANGIOGRAPHY;  Surgeon: Lyn Records, MD;  Location: [facility] INVASIVE CV LAB;  Service: Cardiovascular;  Laterality: N/A;    Family History  Problem Relation Age of Onset   Cancer Mother    Colon cancer Mother    Diabetes Mother    Hypertension Father    Thyroid disease Father    Thyroid cancer Father    Diabetes Father    Esophageal cancer Father    Breast cancer Maternal Aunt    Colon cancer Maternal Aunt    Breast cancer Maternal Aunt    Colon cancer Maternal Grandmother    Stroke Maternal Grandfather    Stomach cancer Paternal Grandfather    Colon cancer Son    Colon polyps Neg Hx    Rectal cancer Neg Hx     Social History   Socioeconomic History   Marital status: Married    Spouse name: Not on file  Number of children: 2   Years of education: Not on file   Highest education level: Not on file  Occupational History   Occupation: retired  Tobacco Use   Smoking status: Never    Passive exposure: Never   Smokeless tobacco: Never  Vaping Use   Vaping status: Never Used  Substance and Sexual Activity   Alcohol use: Not Currently    Comment: occasionally   Drug use: No   Sexual activity: Never    Partners: Male  Other Topics Concern   Not on file  Social History Narrative   Not on file   Social Determinants of Health   Financial Resource Strain: Low Risk  (11/30/2022)   Overall Financial Resource Strain (CARDIA)    Difficulty of Paying Living Expenses: Not hard at all  Food  Insecurity: No Food Insecurity (11/30/2022)   Hunger Vital Sign    Worried About Running Out of Food in the Last Year: Never true    Ran Out of Food in the Last Year: Never true  Transportation Needs: No Transportation Needs (11/30/2022)   PRAPARE - Administrator, Civil Service (Medical): No    Lack of Transportation (Non-Medical): No  Physical Activity: Inactive (11/30/2022)   Exercise Vital Sign    Days of Exercise per Week: 0 days    Minutes of Exercise per Session: 0 min  Stress: No Stress Concern Present (11/30/2022)   [employer] of Occupational Health - Occupational Stress Questionnaire    Feeling of Stress : Not at all  Social Connections: Moderately Isolated (11/30/2022)   Social Connection and Isolation Panel [NHANES]    Frequency of Communication with Friends and Family: More than three times a week    Frequency of Social Gatherings with Friends and Family: More than three times a week    Attends Religious Services: Never    Database administrator or Organizations: No    Attends Banker Meetings: Never    Marital Status: Married  Catering manager Violence: Not At Risk (11/30/2022)   Humiliation, Afraid, Rape, and Kick questionnaire    Fear of Current or Ex-Partner: No    Emotionally Abused: No    Physically Abused: No    Sexually Abused: No    Outpatient Medications Prior to Visit  Medication Sig Dispense Refill   ACCU-CHEK AVIVA PLUS test strip Inject 1 strip as directed daily.  4   acetaminophen (TYLENOL) 500 MG tablet Take 500 mg by mouth every 6 (six) hours as needed for mild pain or headache. As needed.     albuterol (VENTOLIN HFA) 108 (90 Base) MCG/ACT inhaler Inhale 2 puffs into the lungs every 6 (six) hours as needed for wheezing or shortness of breath. 8 g 2   aspirin 325 MG tablet Take 325 mg by mouth daily.     bisacodyl (DULCOLAX) 5 MG EC tablet Take 5 mg by mouth. One every other day     Calcium Carb-Cholecalciferol (CALCIUM 600-D PO)  Take 1 tablet by mouth daily.     Continuous Glucose Sensor (DEXCOM G7 SENSOR) MISC by Does not apply route.     EPINEPHrine 0.3 mg/0.3 mL IJ SOAJ injection Inject 0.3 mg into the muscle as needed for anaphylaxis.     ferrous sulfate 325 (65 FE) MG tablet Take 325 mg by mouth daily with breakfast.     furosemide (LASIX) 20 MG tablet TAKE 1 TABLET BY MOUTH DAILY.  (MAY TAKE EXTRA 1 TABLET AS  NEEDED  FOR SEVERE SWELLING) 167 tablet 3   HUMALOG KWIKPEN 100 UNIT/ML KwikPen Inject 8-11 Units into the skin 3 (three) times daily.     Lancets Thin MISC Inject 1 Stick into the skin daily.     levothyroxine (SYNTHROID) 88 MCG tablet TAKE 1 TABLET BY MOUTH DAILY  BEFORE BREAKFAST 100 tablet 2   lubiprostone (AMITIZA) 8 MCG capsule Take 1 capsule (8 mcg total) by mouth 2 (two) times daily with a meal. 60 capsule 1   Multiple Vitamins-Minerals (MULTIVITAMIN ADULT) CHEW Chew 1 tablet by mouth daily at 12 noon.     NOVOFINE PEN NEEDLE 32G X 6 MM MISC Use to inject insulin 4 times daily 200 each 2   olmesartan (BENICAR) 20 MG tablet TAKE 1 TABLET BY MOUTH DAILY 100 tablet 2   omeprazole (PRILOSEC) 40 MG capsule Take 1 capsule (40 mg total) by mouth daily. 90 capsule 3   potassium chloride (KLOR-CON M) 10 MEQ tablet Take 1 tablet (10 mEq total) by mouth daily. 60 tablet 5   simvastatin (ZOCOR) 20 MG tablet TAKE 1 TABLET BY MOUTH AT  BEDTIME 90 tablet 3   terbinafine (LAMISIL AT) 1 % cream Apply 1 Application topically 2 (two) times daily. 30 g 0   tirzepatide (MOUNJARO) 12.5 MG/0.5ML Pen Inject 12.5 mg into the skin once a week. 6 mL 1   TRESIBA FLEXTOUCH 200 UNIT/ML FlexTouch Pen Inject 56 Units into the skin at bedtime. 45 mL 3   Vibegron (GEMTESA) 75 MG TABS Take 1 tablet (75 mg total) by mouth daily.     No facility-administered medications prior to visit.    Allergies  Allergen Reactions   Corn Oil Rash   Other Rash    PT STATES SHE IS ALLERGIC TO MOST FOODS-STATES IT WAS FOUND IN ALLERGY TESTING    Peanut Allergen Powder-Dnfp Rash   Peanut-Containing Drug Products Rash   Penicillins Rash    Has patient had a PCN reaction causing immediate rash, facial/tongue/throat swelling, SOB or lightheadedness with hypotension: Yes Has patient had a PCN reaction causing severe rash involving mucus membranes or skin necrosis: No Has patient had a PCN reaction that required hospitalization No Has patient had a PCN reaction occurring within the last 10 years: No. Has not had in 40-50 years If all of the above answers are "NO", then may proceed with Cephalosporin use.    ROS Review of Systems  Constitutional:  Negative for chills and fever.  Eyes:  Negative for visual disturbance.  Respiratory:  Negative for chest tightness and shortness of breath.   Neurological:  Negative for dizziness and headaches.      Objective:    Physical Exam HENT:     Head: Normocephalic.     Mouth/Throat:     Mouth: Mucous membranes are moist.  Cardiovascular:     Rate and Rhythm: Normal rate.     Heart sounds: Normal heart sounds.  Pulmonary:     Effort: Pulmonary effort is normal.     Breath sounds: Normal breath sounds.  Neurological:     Mental Status: She is alert.     BP 138/75   Pulse 70   Wt 278 lb 0.6 oz (126.1 kg)   SpO2 94%   BMI 54.30 kg/m  Wt Readings from Last 3 Encounters:  03/05/23 278 lb 0.6 oz (126.1 kg)  12/11/22 275 lb 3.2 oz (124.8 kg)  12/08/22 275 lb (124.7 kg)    Lab Results  Component Value Date  TSH 1.830 02/21/2023   Lab Results  Component Value Date   WBC 8.8 09/06/2022   HGB 11.2 09/06/2022   HCT 35.7 09/06/2022   MCV 81 09/06/2022   PLT 298 09/06/2022   Lab Results  Component Value Date   NA 141 09/06/2022   K 5.0 09/06/2022   CO2 20 09/06/2022   GLUCOSE 146 (H) 09/06/2022   BUN 26 09/06/2022   CREATININE 2.01 (H) 09/06/2022   BILITOT 0.2 09/06/2022   ALKPHOS 145 (H) 09/06/2022   AST 15 09/06/2022   ALT 10 09/06/2022   PROT 6.8 09/06/2022    ALBUMIN 4.1 09/06/2022   CALCIUM 9.3 09/06/2022   ANIONGAP 10 09/15/2020   EGFR 25 (L) 09/06/2022   Lab Results  Component Value Date   CHOL 125 09/06/2022   Lab Results  Component Value Date   HDL 45 09/06/2022   Lab Results  Component Value Date   LDLCALC 52 09/06/2022   Lab Results  Component Value Date   TRIG 164 (H) 09/06/2022   Lab Results  Component Value Date   CHOLHDL 2.8 09/06/2022   Lab Results  Component Value Date   HGBA1C 8.8 (A) 12/11/2022      Assessment & Plan:  Essential hypertension Assessment & Plan: Controlled She takes olmesartan 20 mg and Lasix 20 mg as needed for peripheral edema No edema noted today Patient is asymptomatic Encouraged low-sodium diet with increased physical activity BP Readings from Last 3 Encounters:  03/05/23 138/75  02/19/23 (!) 145/79  02/05/23 (!) 148/74       Type 2 diabetes mellitus with hyperosmolarity without coma, with long-term current use of insulin (HCC) Assessment & Plan: Encouraged to continue following-up with endocrinology   Other specified hypothyroidism Assessment & Plan: She takes Synthroid 88 mcg daily. Recent thyroid levels were reviewed and are within the stable range.  Lab Results  Component Value Date   TSH 1.830 02/21/2023       Vitamin D deficiency -     VITAMIN D 25 Hydroxy (Vit-D Deficiency, Fractures)  Other hyperlipidemia -     Lipid panel -     CMP14+EGFR -     CBC with Differential/Platelet   Note: This chart has been completed using Engineer, civil (consulting) software, and while attempts have been made to ensure accuracy, certain words and phrases may not be transcribed as intended.   Follow-up: No follow-ups on file.   Gilmore Laroche, FNP

## 2023-03-05 NOTE — Patient Instructions (Addendum)
I appreciate the opportunity to provide care to you today!    Follow up:  3 months  Labs: please stop by the lab during the week  to get your blood drawn (CBC, CMP, Lipid profile, Vit D)  -please continue taking synthroid 88 mcg daily on an empty stomach   Encourage high-calorie and high-protein diet Recommended eating  5 to 6 small meals a day, instead of 3 large meals Encouraged to keep snacks around that are easy to eat. Try to eat often, even if you don't feel hungry Encouraged to drink high-protein nutritional supplement drinks (sample brand names: Ensure, Boost, Carnation Instant Breakfast   What foods give me extra calories or protein?  Eat these foods or mix them with other foods to increase calories and protein.  ?Grains - Add butter, cream cheese, or nut butter to bread, crackers, or pancakes. Mix pasta or quinoa with meats or vegetables. Sprinkle granola on yogurts and hot cereals. Choose croissants, muffins, and biscuits.  ?Fruits - Mix fresh, frozen, or dried fruit into cereal or smoothies. Drink 100 percent fruit juice. Eat fresh, frozen, or canned fruit packed in its own juice.  ?Vegetables - Add cheese, butter, or sauces to vegetables. Put avocado in sandwiches. Eat bean dip, guacamole, or hummus with chips.  ?Dairy - Use full-fat milk or milk products. Add cheese to sandwiches and casseroles. Add [place] yogurt, heavy cream, or whipping cream to smoothies and shakes. Put sour cream or butter in other foods. Eat ice cream, custard, pudding, and cottage cheese.  ?Meats, poultry, seafood, and proteins - Add meats or eggs to salads, casseroles, and vegetables. Use gravies and sauces. Snack on nuts and nut butter.  ?Other foods or drinks - Drink high-protein nutritional supplement drinks (sample brand names: Ensure, Boost, [employer]).  Some tips to help you increase calories and protein in your diet:  ?Eat 5 to 6 small meals a day, instead of 3 large  meals. You can also choose high-calorie snacks and drinks between meals.  ?Keep snacks around that are easy to eat. Try to eat often, even if you don't feel hungry.  ?Add butter, olive oil, pesto, nuts, sauces, gravy, powdered milk, protein powder, or cream to your foods. This gives them extra calories and protein.  ?Drink 100 percent fruit juice, milkshakes, and smoothies instead of water. Try to drink at the end of meals so you don't fill up too soon.  ?Add syrup, jams and jellies, honey, or brown sugar to other foods. Snack on protein bars.    Please continue to a heart-healthy diet and increase your physical activities. Try to exercise for at least five days a week.    It was a pleasure to see you and I look forward to continuing to work together on your health and well-being. Please do not hesitate to call the office if you need care or have questions about your care.  In case of emergency, please visit the Emergency Department for urgent care, or contact our clinic at [phone] to schedule an appointment. We're here to help you!   Have a wonderful day and week. With Gratitude, Gilmore Laroche MSN, FNP-BC

## 2023-03-08 NOTE — Assessment & Plan Note (Signed)
Encouraged to continue following-up with endocrinology

## 2023-03-08 NOTE — Assessment & Plan Note (Signed)
Controlled She takes olmesartan 20 mg and Lasix 20 mg as needed for peripheral edema No edema noted today Patient is asymptomatic Encouraged low-sodium diet with increased physical activity BP Readings from Last 3 Encounters:  03/05/23 138/75  02/19/23 (!) 145/79  02/05/23 (!) 148/74

## 2023-03-08 NOTE — Assessment & Plan Note (Addendum)
She takes Synthroid 88 mcg daily. Recent thyroid levels were reviewed and are within the stable range.  Lab Results  Component Value Date   TSH 1.830 02/21/2023

## 2023-03-13 ENCOUNTER — Ambulatory Visit: Payer: Medicare Other | Admitting: Nurse Practitioner

## 2023-03-13 ENCOUNTER — Encounter: Payer: Self-pay | Admitting: Nurse Practitioner

## 2023-03-13 VITALS — BP 134/68 | HR 74 | Ht 60.0 in | Wt 279.8 lb

## 2023-03-13 DIAGNOSIS — Z808 Family history of malignant neoplasm of other organs or systems: Secondary | ICD-10-CM

## 2023-03-13 DIAGNOSIS — E1122 Type 2 diabetes mellitus with diabetic chronic kidney disease: Secondary | ICD-10-CM

## 2023-03-13 DIAGNOSIS — E782 Mixed hyperlipidemia: Secondary | ICD-10-CM

## 2023-03-13 DIAGNOSIS — E039 Hypothyroidism, unspecified: Secondary | ICD-10-CM | POA: Diagnosis not present

## 2023-03-13 DIAGNOSIS — I10 Essential (primary) hypertension: Secondary | ICD-10-CM

## 2023-03-13 DIAGNOSIS — Z794 Long term (current) use of insulin: Secondary | ICD-10-CM | POA: Diagnosis not present

## 2023-03-13 DIAGNOSIS — N1832 Chronic kidney disease, stage 3b: Secondary | ICD-10-CM | POA: Diagnosis not present

## 2023-03-13 DIAGNOSIS — E7849 Other hyperlipidemia: Secondary | ICD-10-CM

## 2023-03-13 DIAGNOSIS — Z7985 Long-term (current) use of injectable non-insulin antidiabetic drugs: Secondary | ICD-10-CM

## 2023-03-13 LAB — POCT GLYCOSYLATED HEMOGLOBIN (HGB A1C): Hemoglobin A1C: 7.7 % — AB (ref 4.0–5.6)

## 2023-03-13 MED ORDER — TIRZEPATIDE 15 MG/0.5ML ~~LOC~~ SOAJ
15.0000 mg | SUBCUTANEOUS | 3 refills | Status: DC
Start: 2023-03-13 — End: 2024-01-16

## 2023-03-13 MED ORDER — SIMVASTATIN 20 MG PO TABS: 20.0000 mg | ORAL_TABLET | Freq: Every day | ORAL | 3 refills | Status: DC

## 2023-03-13 MED ORDER — LEVOTHYROXINE SODIUM 88 MCG PO TABS: 88.0000 ug | ORAL_TABLET | Freq: Every day | ORAL | 3 refills | Status: DC

## 2023-03-13 MED ORDER — HUMALOG KWIKPEN 100 UNIT/ML ~~LOC~~ SOPN: 5.0000 [IU] | PEN_INJECTOR | Freq: Three times a day (TID) | SUBCUTANEOUS | Status: DC

## 2023-03-13 NOTE — Progress Notes (Signed)
Endocrinology Follow Up Note       03/13/2023, 3:16 PM   Subjective:    Patient ID: Jessica Strong, female    DOB: [birth year].  Jessica Strong is being seen in follow up after being seen in consultation for management of currently uncontrolled symptomatic diabetes requested by  Gilmore Laroche, FNP.   Past Medical History:  Diagnosis Date   Allergy    Anemia    Anxiety    Arthritis    Asthma    Cataract    cervical ca 1990   Depression    Diabetes (HCC)    Dyspnea    Hypercholesterolemia    Hypertension    Hypothyroidism    Myocardial infarction (HCC) 2005   Sleep apnea    on CPAP 14 mm PS since February 2017   Stroke [facility]) 2007   weakness on R side   Vitamin D deficiency     Past Surgical History:  Procedure Laterality Date   ABDOMINAL HYSTERECTOMY     BARIATRIC SURGERY     BIOPSY  09/17/2020   Procedure: BIOPSY;  Surgeon: Dolores Frame, MD;  Location: [facility];  Service: Gastroenterology;;   BIOPSY  12/08/2022   Procedure: BIOPSY;  Surgeon: Dolores Frame, MD;  Location: [facility];  Service: Gastroenterology;;   BREAST BIOPSY Right    beningn   CARDIAC CATHETERIZATION N/A 12/24/2015   Procedure: Right/Left Heart Cath and Coronary Angiography;  Surgeon: Corky Crafts, MD;  Location: [facility] INVASIVE CV LAB;  Service: Cardiovascular;  Laterality: N/A;   COLONOSCOPY  2018   COLONOSCOPY WITH PROPOFOL N/A 09/17/2020   Procedure: COLONOSCOPY WITH PROPOFOL;  Surgeon: Dolores Frame, MD;  Location: [facility];  Service: Gastroenterology;  Laterality: N/A;  9:00   ESOPHAGOGASTRODUODENOSCOPY (EGD) WITH PROPOFOL N/A 09/17/2020   Procedure: ESOPHAGOGASTRODUODENOSCOPY (EGD) WITH PROPOFOL;  Surgeon: Dolores Frame, MD;  [facility]: [facility];  Service: Gastroenterology;  Laterality: N/A;   ESOPHAGOGASTRODUODENOSCOPY (EGD) WITH PROPOFOL N/A 12/08/2022    Procedure: ESOPHAGOGASTRODUODENOSCOPY (EGD) WITH PROPOFOL;  Surgeon: Dolores Frame, MD;  Location: [facility];  Service: Gastroenterology;  Laterality: N/A;  2:30PM;ASA 3   HERNIA REPAIR     LEFT HEART CATH AND CORONARY ANGIOGRAPHY N/A 07/12/2017   Procedure: LEFT HEART CATH AND CORONARY ANGIOGRAPHY;  Surgeon: Lyn Records, MD;  Location: [facility] INVASIVE CV LAB;  Service: Cardiovascular;  Laterality: N/A;    Social History   Socioeconomic History   Marital status: Married    Spouse name: Not on file   Number of children: 2   Years of education: Not on file   Highest education level: Not on file  Occupational History   Occupation: retired  Tobacco Use   Smoking status: Never    Passive exposure: Never   Smokeless tobacco: Never  Vaping Use   Vaping status: Never Used  Substance and Sexual Activity   Alcohol use: Not Currently    Comment: occasionally   Drug use: No   Sexual activity: Never    Partners: Male  Other Topics Concern   Not on file  Social History Narrative   Not on file   Social  Determinants of Health   Financial Resource Strain: Low Risk  (11/30/2022)   Overall Financial Resource Strain (CARDIA)    Difficulty of Paying Living Expenses: Not hard at all  Food Insecurity: No Food Insecurity (11/30/2022)   Hunger Vital Sign    Worried About Running Out of Food in the Last Year: Never true    Ran Out of Food in the Last Year: Never true  Transportation Needs: No Transportation Needs (11/30/2022)   PRAPARE - Administrator, Civil Service (Medical): No    Lack of Transportation (Non-Medical): No  Physical Activity: Inactive (11/30/2022)   Exercise Vital Sign    Days of Exercise per Week: 0 days    Minutes of Exercise per Session: 0 min  Stress: No Stress Concern Present (11/30/2022)   [employer] of Occupational Health - Occupational Stress Questionnaire    Feeling of Stress : Not at all  Social Connections: Moderately Isolated  (11/30/2022)   Social Connection and Isolation Panel [NHANES]    Frequency of Communication with Friends and Family: More than three times a week    Frequency of Social Gatherings with Friends and Family: More than three times a week    Attends Religious Services: Never    Database administrator or Organizations: No    Attends Engineer, structural: Never    Marital Status: Married    Family History  Problem Relation Age of Onset   Cancer Mother    Colon cancer Mother    Diabetes Mother    Hypertension Father    Thyroid disease Father    Thyroid cancer Father    Diabetes Father    Esophageal cancer Father    Breast cancer Maternal Aunt    Colon cancer Maternal Aunt    Breast cancer Maternal Aunt    Colon cancer Maternal Grandmother    Stroke Maternal Grandfather    Stomach cancer Paternal Grandfather    Colon cancer Son    Colon polyps Neg Hx    Rectal cancer Neg Hx     Outpatient Encounter Medications as of 03/13/2023  Medication Sig   ACCU-CHEK AVIVA PLUS test strip Inject 1 strip as directed daily.   acetaminophen (TYLENOL) 500 MG tablet Take 500 mg by mouth every 6 (six) hours as needed for mild pain or headache. As needed.   albuterol (VENTOLIN HFA) 108 (90 Base) MCG/ACT inhaler Inhale 2 puffs into the lungs every 6 (six) hours as needed for wheezing or shortness of breath.   aspirin 325 MG tablet Take 325 mg by mouth daily.   bisacodyl (DULCOLAX) 5 MG EC tablet Take 5 mg by mouth. One every other day   Calcium Carb-Cholecalciferol (CALCIUM 600-D PO) Take 1 tablet by mouth daily.   Continuous Glucose Sensor (DEXCOM G7 SENSOR) MISC by Does not apply route.   EPINEPHrine 0.3 mg/0.3 mL IJ SOAJ injection Inject 0.3 mg into the muscle as needed for anaphylaxis.   ferrous sulfate 325 (65 FE) MG tablet Take 325 mg by mouth daily with breakfast.   furosemide (LASIX) 20 MG tablet TAKE 1 TABLET BY MOUTH DAILY.  (MAY TAKE EXTRA 1 TABLET AS  NEEDED FOR SEVERE SWELLING)    Lancets Thin MISC Inject 1 Stick into the skin daily.   lubiprostone (AMITIZA) 8 MCG capsule Take 1 capsule (8 mcg total) by mouth 2 (two) times daily with a meal.   Multiple Vitamins-Minerals (MULTIVITAMIN ADULT) CHEW Chew 1 tablet by mouth daily at 12 noon.  NOVOFINE PEN NEEDLE 32G X 6 MM MISC Use to inject insulin 4 times daily   olmesartan (BENICAR) 20 MG tablet TAKE 1 TABLET BY MOUTH DAILY   omeprazole (PRILOSEC) 40 MG capsule Take 1 capsule (40 mg total) by mouth daily.   potassium chloride (KLOR-CON M) 10 MEQ tablet Take 1 tablet (10 mEq total) by mouth daily.   terbinafine (LAMISIL AT) 1 % cream Apply 1 Application topically 2 (two) times daily.   tirzepatide (MOUNJARO) 15 MG/0.5ML Pen Inject 15 mg into the skin once a week.   TRESIBA FLEXTOUCH 200 UNIT/ML FlexTouch Pen Inject 56 Units into the skin at bedtime.   Vibegron (GEMTESA) 75 MG TABS Take 1 tablet (75 mg total) by mouth daily.   [DISCONTINUED] HUMALOG KWIKPEN 100 UNIT/ML KwikPen Inject 8-11 Units into the skin 3 (three) times daily.   [DISCONTINUED] levothyroxine (SYNTHROID) 88 MCG tablet TAKE 1 TABLET BY MOUTH DAILY  BEFORE BREAKFAST   [DISCONTINUED] simvastatin (ZOCOR) 20 MG tablet TAKE 1 TABLET BY MOUTH AT  BEDTIME   [DISCONTINUED] tirzepatide (MOUNJARO) 12.5 MG/0.5ML Pen Inject 12.5 mg into the skin once a week.   HUMALOG KWIKPEN 100 UNIT/ML KwikPen Inject 5-8 Units into the skin 3 (three) times daily.   levothyroxine (SYNTHROID) 88 MCG tablet Take 1 tablet (88 mcg total) by mouth daily before breakfast.   simvastatin (ZOCOR) 20 MG tablet Take 1 tablet (20 mg total) by mouth at bedtime.   No facility-administered encounter medications on file as of 03/13/2023.    ALLERGIES: Allergies  Allergen Reactions   Corn Oil Rash   Other Rash    PT STATES SHE IS ALLERGIC TO MOST FOODS-STATES IT WAS FOUND IN ALLERGY TESTING   Peanut Allergen Powder-Dnfp Rash   Peanut-Containing Drug Products Rash   Penicillins Rash    Has  patient had a PCN reaction causing immediate rash, facial/tongue/throat swelling, SOB or lightheadedness with hypotension: Yes Has patient had a PCN reaction causing severe rash involving mucus membranes or skin necrosis: No Has patient had a PCN reaction that required hospitalization No Has patient had a PCN reaction occurring within the last 10 years: No. Has not had in 40-50 years If all of the above answers are "NO", then may proceed with Cephalosporin use.    VACCINATION STATUS: Immunization History  Administered Date(s) Administered   Moderna Sars-Covid-2 Vaccination 07/11/2019, 08/08/2019, 08/11/2020   PNEUMOCOCCAL CONJUGATE-20 02/21/2022   Zoster Recombinant(Shingrix) 08/17/2022, 10/02/2022    Diabetes She presents for her follow-up diabetic visit. She has type 2 diabetes mellitus. Onset time: Diagnosed at approx age of 54. Her disease course has been stable. Hypoglycemia symptoms include nervousness/anxiousness, sweats and tremors. Associated symptoms include fatigue and foot paresthesias. Pertinent negatives for diabetes include no polydipsia, no polyuria and no weight loss. There are no hypoglycemic complications. Symptoms are improving. Diabetic complications include a CVA, heart disease, nephropathy and peripheral neuropathy. Risk factors for coronary artery disease include diabetes mellitus, dyslipidemia, family history, obesity, hypertension, post-menopausal and sedentary lifestyle. Current diabetic treatment includes intensive insulin program (and Mounjaro). She is compliant with treatment most of the time. Her weight is decreasing steadily. She is following a generally healthy diet. When asked about meal planning, she reported none. She has not had a previous visit with a dietitian. She rarely participates in exercise. Her home blood glucose trend is decreasing steadily. Her breakfast blood glucose range is generally 110-130 mg/dl. Her overall blood glucose range is 140-180 mg/dl.  (She presents today, accompanied by her husband, with her  CGM showing mostly at goal glycemic profile.  Her POCT A1c today is 7.7%, improving from last visit of 8.8%.  Analysis of her CGM shows TIR 63%, TAR 37%, TBR 0% with a GMI of 7.3%.  She is frustrated that she has not lost weight.) An ACE inhibitor/angiotensin II receptor blocker is being taken. She does not see a podiatrist.Eye exam is current.  Hypertension This is a chronic problem. The current episode started more than 1 year ago. The problem has been resolved since onset. The problem is controlled. Associated symptoms include sweats. Agents associated with hypertension include thyroid hormones. Risk factors for coronary artery disease include sedentary lifestyle, obesity, post-menopausal state, dyslipidemia, diabetes mellitus and family history. Past treatments include angiotensin blockers and diuretics. The current treatment provides moderate improvement. Compliance problems include diet and exercise.  Hypertensive end-organ damage includes kidney disease, CAD/MI and CVA. Identifiable causes of hypertension include a thyroid problem.     Review of systems  Constitutional: + stable body weight, current Body mass index is 54.64 kg/m., + fatigue, no subjective hyperthermia, no subjective hypothermia Eyes: no blurry vision, no xerophthalmia ENT: no sore throat, no nodules palpated in throat, no dysphagia/odynophagia, no hoarseness Cardiovascular: no chest pain, no shortness of breath, no palpitations, + leg swelling Respiratory: no cough, + shortness of breath with exertion Gastrointestinal: no nausea/vomiting/diarrhea Musculoskeletal: no muscle/joint aches, right side weakness from previous CVA Skin: no rashes, no hyperemia Neurological: no tremors, no dizziness Psychiatric: + significant depression, no anxiety  Objective:     BP 134/68 (BP Location: Left Arm, Patient Position: Sitting, Cuff Size: Large)   Pulse 74   Ht 5' (1.524 m)    Wt 279 lb 12.8 oz (126.9 kg)   BMI 54.64 kg/m   Wt Readings from Last 3 Encounters:  03/13/23 279 lb 12.8 oz (126.9 kg)  03/05/23 278 lb 0.6 oz (126.1 kg)  12/11/22 275 lb 3.2 oz (124.8 kg)     BP Readings from Last 3 Encounters:  03/13/23 134/68  03/05/23 138/75  02/19/23 (!) 145/79     Physical Exam- Limited  Constitutional:  Body mass index is 54.64 kg/m. , not in acute distress, normal state of mind Eyes:  EOMI, no exophthalmos Musculoskeletal: minimal right sided weakness- residual from previous stroke Skin:  no rashes, no hyperemia Neurological: no tremor with outstretched hands  Diabetic Foot Exam - Simple   No data filed     CMP ( most recent) CMP     Component Value Date/Time   NA 141 09/06/2022 1148   K 5.0 09/06/2022 1148   CL 104 09/06/2022 1148   CO2 20 09/06/2022 1148   GLUCOSE 146 (H) 09/06/2022 1148   GLUCOSE 172 (H) 09/15/2020 1037   BUN 26 09/06/2022 1148   CREATININE 2.01 (H) 09/06/2022 1148   CALCIUM 9.3 09/06/2022 1148   PROT 6.8 09/06/2022 1148   ALBUMIN 4.1 09/06/2022 1148   AST 15 09/06/2022 1148   ALT 10 09/06/2022 1148   ALKPHOS 145 (H) 09/06/2022 1148   BILITOT 0.2 09/06/2022 1148   GFRNONAA 32 (L) 09/15/2020 1037   GFRAA 48 (L) 07/09/2017 1112     Diabetic Labs (most recent): Lab Results  Component Value Date   HGBA1C 7.7 (A) 03/13/2023   HGBA1C 8.8 (A) 12/11/2022   HGBA1C 8.0 (A) 05/08/2022   MICROALBUR 150 07/11/2021     Lipid Panel ( most recent) Lipid Panel     Component Value Date/Time   CHOL 125 09/06/2022 1148  TRIG 164 (H) 09/06/2022 1148   HDL 45 09/06/2022 1148   CHOLHDL 2.8 09/06/2022 1148   LDLCALC 52 09/06/2022 1148   LABVLDL 28 09/06/2022 1148      Lab Results  Component Value Date   TSH 1.830 02/21/2023   TSH 3.020 11/27/2022   TSH 3.330 09/06/2022   TSH 3.880 04/17/2022   TSH 5.750 (H) 11/04/2021   TSH 1.533 09/29/2020   FREET4 1.35 02/21/2023   FREET4 1.29 11/27/2022   FREET4 1.23  09/06/2022   FREET4 1.50 04/17/2022   FREET4 1.06 11/04/2021           Assessment & Plan:   1) Type 2 diabetes mellitus with stage 3b chronic kidney disease, with long-term current use of insulin (HCC)  She presents today, accompanied by her husband, with her CGM showing mostly at goal glycemic profile.  Her POCT A1c today is 7.7%, improving from last visit of 8.8%.  Analysis of her CGM shows TIR 63%, TAR 37%, TBR 0% with a GMI of 7.3%.  She is frustrated that she has not lost weight.  - Jessica Shelden has currently uncontrolled symptomatic type 2 DM since 77 years of age.   -Recent labs reviewed.  - I had a long discussion with her about the progressive nature of diabetes and the pathology behind its complications. -her diabetes is complicated by CVA, MI, CAD, OSA, CKD and she remains at a high risk for more acute and chronic complications which include CAD, CVA, CKD, retinopathy, and neuropathy. These are all discussed in detail with her.  The following Lifestyle Medicine recommendations according to American College of Lifestyle Medicine [facility]) were discussed and offered to patient and she agrees to start the journey:  A. Whole Foods, Plant-based plate comprising of fruits and vegetables, plant-based proteins, whole-grain carbohydrates was discussed in detail with the patient.   A list for source of those nutrients were also provided to the patient.  Patient will use only water or unsweetened tea for hydration. B.  The need to stay away from risky substances including alcohol, smoking; obtaining 7 to 9 hours of restorative sleep, at least 150 minutes of moderate intensity exercise weekly, the importance of healthy social connections,  and stress reduction techniques were discussed. C.  A full color page of  Calorie density of various food groups per pound showing examples of each food groups was provided to the patient.  - Nutritional counseling repeated at each appointment due to  patients tendency to fall back in to old habits.  - The patient admits there is a room for improvement in their diet and drink choices. -  Suggestion is made for the patient to avoid simple carbohydrates from their diet including Cakes, Sweet Desserts / Pastries, Ice Cream, Soda (diet and regular), Sweet Tea, Candies, Chips, Cookies, Sweet Pastries, Store Bought Juices, Alcohol in Excess of 1-2 drinks a day, Artificial Sweeteners, Coffee Creamer, and "Sugar-free" Products. This will help patient to have stable blood glucose profile and potentially avoid unintended weight gain.   - I encouraged the patient to switch to unprocessed or minimally processed complex starch and increased protein intake (animal or plant source), fruits, and vegetables.   - Patient is advised to stick to a routine mealtimes to eat 3 meals a day and avoid unnecessary snacks (to snack only to correct hypoglycemia).  - she will be scheduled with Norm Salt, RDN, CDE for diabetes education.  - I have approached her with the following individualized plan to  manage her diabetes and patient agrees:   -She is advised to continue her Tresiba 56 units SQ nightly, adjust her Humalog to 5-8 units TID with meals if glucose is above 90 and she is eating (Specific instructions on how to titrate insulin dosage based on glucose readings given to patient in writing) and increase Mounjaro to 15 mg SQ weekly.  Once she is on the higher dose of Mounjaro for 2 weeks, she can stop the Humalog altogether.   -she is encouraged to continue monitoring glucose 4 times daily (using her CGM), before meals and before bed, and to call the clinic if she has readings less than 70 or above 300 for 3 tests in a row.    - she is warned not to take insulin without proper monitoring per orders. - Adjustment parameters are given to her for hypo and hyperglycemia in writing.  - her Metformin was discontinued, risk outweighs benefit for this patient given  recent renal decline.  - Specific targets for  A1c; LDL, HDL, and Triglycerides were discussed with the patient.  2) Blood Pressure /Hypertension:  her blood pressure is controlled to target for her age.   she is advised to continue her current medications including Benicar 20 mg po daily, Lasix 20 mg po daily as needed.  3) Lipids/Hyperlipidemia:    Her most recent lipid panel from 09/06/22 shows controlled LDL of 52.  She is advised to continue Zocor 20 mg po daily at bedtime.  Labs ordered by PCP.  4)  Weight/Diet:  her Body mass index is 54.64 kg/m.  -  clearly complicating her diabetes care.   she is a candidate for weight loss. I discussed with her the fact that loss of 5 - 10% of her  current body weight will have the most impact on her diabetes management.  Exercise, and detailed carbohydrates information provided  -  detailed on discharge instructions.  5) Hypothyroidism Her previsit TFTs are consistent with appropriate hormone replacement.  She is advised to continue Levothyroxine 88 mcg po daily before breakfast.  - The correct intake of thyroid hormone (Levothyroxine, Synthroid), is on empty stomach first thing in the morning, with water, separated by at least 30 minutes from breakfast and other medications,  and separated by more than 4 hours from calcium, iron, multivitamins, acid reflux medications (PPIs).  - This medication is a life-long medication and will be needed to correct thyroid hormone imbalances for the rest of your life.  The dose may change from time to time, based on thyroid blood work.  - It is extremely important to be consistent taking this medication, near the same time each morning.  -AVOID TAKING PRODUCTS CONTAINING BIOTIN (commonly found in Hair, Skin, Nails vitamins) AS IT INTERFERES WITH THE VALIDITY OF THYROID FUNCTION BLOOD TESTS.  6) Chronic Care/Health Maintenance: -she is on ACEI/ARB and not on Statin medications and is encouraged to initiate and  continue to follow up with Ophthalmology, Dentist, Podiatrist at least yearly or according to recommendations, and advised to stay away from smoking. I have recommended yearly flu vaccine and pneumonia vaccine at least every 5 years; moderate intensity exercise for up to 150 minutes weekly; and sleep for at least 7 hours a day.  - she is advised to maintain close follow up with Gilmore Laroche, FNP for primary care needs, as well as her other providers for optimal and coordinated care.      I spent  43  minutes in the care of  the patient today including review of labs from CMP, Lipids, Thyroid Function, Hematology (current and previous including abstractions from other facilities); face-to-face time discussing  her blood glucose readings/logs, discussing hypoglycemia and hyperglycemia episodes and symptoms, medications doses, her options of short and long term treatment based on the latest standards of care / guidelines;  discussion about incorporating lifestyle medicine;  and documenting the encounter. Risk reduction counseling performed per USPSTF guidelines to reduce obesity and cardiovascular risk factors.     Please refer to Patient Instructions for Blood Glucose Monitoring and Insulin/Medications Dosing Guide"  in media tab for additional information. Please  also refer to " Patient Self Inventory" in the Media  tab for reviewed elements of pertinent patient history.  [name] [facility] participated in the discussions, expressed understanding, and voiced agreement with the above plans.  All questions were answered to her satisfaction. she is encouraged to contact clinic should she have any questions or concerns prior to her return visit.     Follow up plan: - Return in about 3 months (around 06/13/2023) for Diabetes F/U with A1c in office, No previsit labs, Bring meter and logs.  Ronny Bacon, [facility] [address] [place], [name] [address] Phone:  [phone] Fax: [phone]  03/13/2023, 3:16 PM

## 2023-03-14 LAB — CBC WITH DIFFERENTIAL/PLATELET
Basophils Absolute: 0 10*3/uL (ref 0.0–0.2)
Basos: 1 %
EOS (ABSOLUTE): 0.3 10*3/uL (ref 0.0–0.4)
Eos: 4 %
Hematocrit: 33.5 % — ABNORMAL LOW (ref 34.0–46.6)
Hemoglobin: 10.3 g/dL — ABNORMAL LOW (ref 11.1–15.9)
Immature Grans (Abs): 0 10*3/uL (ref 0.0–0.1)
Immature Granulocytes: 0 %
Lymphocytes Absolute: 1.7 10*3/uL (ref 0.7–3.1)
Lymphs: 24 %
MCH: 25.6 pg — ABNORMAL LOW (ref 26.6–33.0)
MCHC: 30.7 g/dL — ABNORMAL LOW (ref 31.5–35.7)
MCV: 83 fL (ref 79–97)
Monocytes Absolute: 0.7 10*3/uL (ref 0.1–0.9)
Monocytes: 10 %
Neutrophils Absolute: 4.4 10*3/uL (ref 1.4–7.0)
Neutrophils: 61 %
Platelets: 252 10*3/uL (ref 150–450)
RBC: 4.02 x10E6/uL (ref 3.77–5.28)
RDW: 14 % (ref 11.7–15.4)
WBC: 7.2 10*3/uL (ref 3.4–10.8)

## 2023-03-14 LAB — CMP14+EGFR
ALT: 11 [IU]/L (ref 0–32)
AST: 15 [IU]/L (ref 0–40)
Albumin: 4 g/dL (ref 3.8–4.8)
Alkaline Phosphatase: 168 [IU]/L — ABNORMAL HIGH (ref 44–121)
BUN/Creatinine Ratio: 15 (ref 12–28)
BUN: 26 mg/dL (ref 8–27)
Bilirubin Total: 0.2 mg/dL (ref 0.0–1.2)
CO2: 19 mmol/L — ABNORMAL LOW (ref 20–29)
Calcium: 9.2 mg/dL (ref 8.7–10.3)
Chloride: 109 mmol/L — ABNORMAL HIGH (ref 96–106)
Creatinine, Ser: 1.74 mg/dL — ABNORMAL HIGH (ref 0.57–1.00)
Globulin, Total: 2.6 g/dL (ref 1.5–4.5)
Glucose: 165 mg/dL — ABNORMAL HIGH (ref 70–99)
Potassium: 5.2 mmol/L (ref 3.5–5.2)
Sodium: 144 mmol/L (ref 134–144)
Total Protein: 6.6 g/dL (ref 6.0–8.5)
eGFR: 30 mL/min/{1.73_m2} — ABNORMAL LOW

## 2023-03-14 LAB — LIPID PANEL
Chol/HDL Ratio: 2.8 ratio (ref 0.0–4.4)
Cholesterol, Total: 124 mg/dL (ref 100–199)
HDL: 44 mg/dL (ref >39)
LDL Chol Calc (NIH): 55 mg/dL (ref 0–99)
Triglycerides: 148 mg/dL (ref 0–149)
VLDL Cholesterol Cal: 25 mg/dL (ref 5–40)

## 2023-03-14 LAB — VITAMIN D 25 HYDROXY (VIT D DEFICIENCY, FRACTURES): Vit D, 25-Hydroxy: 59.4 ng/mL (ref 30.0–100.0)

## 2023-03-14 NOTE — Progress Notes (Signed)
Please inform the patient to increase her fluid intake to at least 64 ounces daily. Her cholesterol levels are stable, and I recommend increasing her intake of iron-rich foods, such as red meat, poultry, fish, spinach, beans, lentils, tofu, and fortified cereals.

## 2023-04-05 ENCOUNTER — Ambulatory Visit: Payer: Medicare Other | Admitting: Urology

## 2023-04-16 ENCOUNTER — Ambulatory Visit (INDEPENDENT_AMBULATORY_CARE_PROVIDER_SITE_OTHER): Payer: Medicare Other | Admitting: Gastroenterology

## 2023-04-16 VITALS — BP 145/84 | HR 72 | Temp 97.6°F | Ht 60.0 in | Wt 273.9 lb

## 2023-04-16 DIAGNOSIS — K5900 Constipation, unspecified: Secondary | ICD-10-CM

## 2023-04-16 DIAGNOSIS — K2970 Gastritis, unspecified, without bleeding: Secondary | ICD-10-CM

## 2023-04-16 DIAGNOSIS — K2980 Duodenitis without bleeding: Secondary | ICD-10-CM

## 2023-04-16 NOTE — Progress Notes (Addendum)
Referring Provider: Gilmore Laroche, FNP Primary Care Physician:  Gilmore Laroche, FNP Primary GI Physician: Dr. Levon Hedger   Chief Complaint  Patient presents with   Abdominal Pain    Follow up on abdominal pain. States pain is better.    HPI:   Jessica Strong is a 77 y.o. female with past medical history of obesity status post Roux-en-Y gastric bypass, anxiety, depression, diabetes, hyperlipidemia, hypertension, myocardial infarction, stroke   Patient presenting today for for follow up of constipation and abdominal pain  Last seen June 2024, at that time constipation doing well with Dulcolax daily, having a BM daily.  Patient with continued left/Right upper abdominal pain.  No nausea or vomiting.  No reflux symptoms.  Appetite not good, feeling bloated.  Patient recommended to continue with Dulcolax, good water intake, abdominal ultrasound complete, consider EGD if pain persist/ultrasound unremarkable.  Ultrasound on 10/30/2022 showed hepatic steatosis but no other significant abnormalities, patient was recommended to proceed with upper endoscopy which was performed in August and showed duodenitis, advised to start omeprazole 40 mg daily.  Present: States she is feeling better. She does not think she is taking omeprazole but states she is taking something she thinks is for acid reflux that was prescribed by [place]. No further abdominal pain. Denies GERD symptoms.   She recently started [facility] and no longer is having issues with constipation. Not taking anything else currently for constipation. Having 1-2 BMs per day. No rectal bleeding or melena.   She has no GI complaints today.   CT A/P w/o contrast: July 21, 2021 Hepatic steatosis.  Small nonobstructive left renal calculus.  Sigmoid diverticulosis without inflammation. EGD:11/2022 duodenitis.  Last Colonoscopy: 09/17/20- A single non-bleeding colonic angiodysplastic lesion. - One 2 mm polyp in the ascending colon-tubular  adenoma - Diverticulosis in the sigmoid colon and in the descending colon. - Non-bleeding internal hemorrhoids.   REPEAT COLONOSCOPY IN 5 YEARS   Past Medical History:  Diagnosis Date   Allergy    Anemia    Anxiety    Arthritis    Asthma    Cataract    cervical ca 1990   Depression    Diabetes (HCC)    Dyspnea    Hypercholesterolemia    Hypertension    Hypothyroidism    Myocardial infarction (HCC) 2005   Sleep apnea    on CPAP 14 mm PS since February 2017   Stroke [facility]) 2007   weakness on R side   Vitamin D deficiency     Past Surgical History:  Procedure Laterality Date   ABDOMINAL HYSTERECTOMY     BARIATRIC SURGERY     BIOPSY  09/17/2020   Procedure: BIOPSY;  Surgeon: Dolores Frame, MD;  [facility];  Service: Gastroenterology;;   BIOPSY  12/08/2022   Procedure: BIOPSY;  Surgeon: Dolores Frame, MD;  [facility];  Service: Gastroenterology;;   BREAST BIOPSY Right    beningn   CARDIAC CATHETERIZATION N/A 12/24/2015   Procedure: Right/Left Heart Cath and Coronary Angiography;  Surgeon: Corky Crafts, MD;  Location: [facility] INVASIVE CV LAB;  Service: Cardiovascular;  Laterality: N/A;   COLONOSCOPY  2018   COLONOSCOPY WITH PROPOFOL N/A 09/17/2020   Procedure: COLONOSCOPY WITH PROPOFOL;  Surgeon: Dolores Frame, MD;  [facility]: [facility];  Service: Gastroenterology;  Laterality: N/A;  9:00   ESOPHAGOGASTRODUODENOSCOPY (EGD) WITH PROPOFOL N/A 09/17/2020   Procedure: ESOPHAGOGASTRODUODENOSCOPY (EGD) WITH PROPOFOL;  Surgeon: Dolores Frame, MD;  Location: [facility];  Service: Gastroenterology;  Laterality: N/A;   ESOPHAGOGASTRODUODENOSCOPY (EGD) WITH PROPOFOL N/A 12/08/2022   Procedure: ESOPHAGOGASTRODUODENOSCOPY (EGD) WITH PROPOFOL;  Surgeon: Dolores Frame, MD;  [facility]: [facility];  Service: Gastroenterology;  Laterality: N/A;  2:30PM;ASA 3   HERNIA REPAIR     LEFT HEART CATH AND  CORONARY ANGIOGRAPHY N/A 07/12/2017   Procedure: LEFT HEART CATH AND CORONARY ANGIOGRAPHY;  Surgeon: Lyn Records, MD;  Location: [facility] INVASIVE CV LAB;  Service: Cardiovascular;  Laterality: N/A;    Current Outpatient Medications  Medication Sig Dispense Refill   ACCU-CHEK AVIVA PLUS test strip Inject 1 strip as directed daily.  4   acetaminophen (TYLENOL) 500 MG tablet Take 500 mg by mouth every 6 (six) hours as needed for mild pain or headache. As needed.     albuterol (VENTOLIN HFA) 108 (90 Base) MCG/ACT inhaler Inhale 2 puffs into the lungs every 6 (six) hours as needed for wheezing or shortness of breath. 8 g 2   aspirin 325 MG tablet Take 325 mg by mouth daily.     bisacodyl (DULCOLAX) 5 MG EC tablet Take 5 mg by mouth. One every other day     Calcium Carb-Cholecalciferol (CALCIUM 600-D PO) Take 1 tablet by mouth daily.     Continuous Glucose Sensor (DEXCOM G7 SENSOR) MISC by Does not apply route.     EPINEPHrine 0.3 mg/0.3 mL IJ SOAJ injection Inject 0.3 mg into the muscle as needed for anaphylaxis.     ferrous sulfate 325 (65 FE) MG tablet Take 325 mg by mouth daily with breakfast.     furosemide (LASIX) 20 MG tablet TAKE 1 TABLET BY MOUTH DAILY.  (MAY TAKE EXTRA 1 TABLET AS  NEEDED FOR SEVERE SWELLING) 167 tablet 3   HUMALOG KWIKPEN 100 UNIT/ML KwikPen Inject 5-8 Units into the skin 3 (three) times daily.     Lancets Thin MISC Inject 1 Stick into the skin daily.     levothyroxine (SYNTHROID) 88 MCG tablet Take 1 tablet (88 mcg total) by mouth daily before breakfast. 90 tablet 3   lubiprostone (AMITIZA) 8 MCG capsule Take 1 capsule (8 mcg total) by mouth 2 (two) times daily with a meal. 60 capsule 1   Multiple Vitamins-Minerals (MULTIVITAMIN ADULT) CHEW Chew 1 tablet by mouth daily at 12 noon.     NOVOFINE PEN NEEDLE 32G X 6 MM MISC Use to inject insulin 4 times daily 200 each 2   olmesartan (BENICAR) 20 MG tablet TAKE 1 TABLET BY MOUTH DAILY 100 tablet 2   omeprazole (PRILOSEC) 40 MG  capsule Take 1 capsule (40 mg total) by mouth daily. 90 capsule 3   potassium chloride (KLOR-CON M) 10 MEQ tablet Take 1 tablet (10 mEq total) by mouth daily. 60 tablet 5   simvastatin (ZOCOR) 20 MG tablet Take 1 tablet (20 mg total) by mouth at bedtime. 90 tablet 3   terbinafine (LAMISIL AT) 1 % cream Apply 1 Application topically 2 (two) times daily. 30 g 0   tirzepatide (MOUNJARO) 15 MG/0.5ML Pen Inject 15 mg into the skin once a week. 6 mL 3   TRESIBA FLEXTOUCH 200 UNIT/ML FlexTouch Pen Inject 56 Units into the skin at bedtime. 45 mL 3   Vibegron (GEMTESA) 75 MG TABS Take 1 tablet (75 mg total) by mouth daily.     No current facility-administered medications for this visit.    Allergies as of 04/16/2023 - Review Complete 04/16/2023  Allergen Reaction Noted   Corn oil  Rash 12/26/2017   Other Rash 12/22/2015   Peanut allergen powder-dnfp Rash 12/26/2017   Peanut-containing drug products Rash 12/22/2015   Penicillins Rash     Family History  Problem Relation Age of Onset   Cancer Mother    Colon cancer Mother    Diabetes Mother    Hypertension Father    Thyroid disease Father    Thyroid cancer Father    Diabetes Father    Esophageal cancer Father    Breast cancer Maternal Aunt    Colon cancer Maternal Aunt    Breast cancer Maternal Aunt    Colon cancer Maternal Grandmother    Stroke Maternal Grandfather    Stomach cancer Paternal Grandfather    Colon cancer Son    Colon polyps Neg Hx    Rectal cancer Neg Hx     Social History   Socioeconomic History   Marital status: Married    Spouse name: Not on file   Number of children: 2   Years of education: Not on file   Highest education level: Not on file  Occupational History   Occupation: retired  Tobacco Use   Smoking status: Never    Passive exposure: Never   Smokeless tobacco: Never  Vaping Use   Vaping status: Never Used  Substance and Sexual Activity   Alcohol use: Not Currently    Comment: occasionally    Drug use: No   Sexual activity: Never    Partners: Male  Other Topics Concern   Not on file  Social History Narrative   Not on file   Social Drivers of Health   Financial Resource Strain: Low Risk  (11/30/2022)   Overall Financial Resource Strain (CARDIA)    Difficulty of Paying Living Expenses: Not hard at all  Food Insecurity: No Food Insecurity (11/30/2022)   Hunger Vital Sign    Worried About Running Out of Food in the Last Year: Never true    Ran Out of Food in the Last Year: Never true  Transportation Needs: No Transportation Needs (11/30/2022)   PRAPARE - Administrator, Civil Service (Medical): No    Lack of Transportation (Non-Medical): No  Physical Activity: Inactive (11/30/2022)   Exercise Vital Sign    Days of Exercise per Week: 0 days    Minutes of Exercise per Session: 0 min  Stress: No Stress Concern Present (11/30/2022)   [employer] of Occupational Health - Occupational Stress Questionnaire    Feeling of Stress : Not at all  Social Connections: Moderately Isolated (11/30/2022)   Social Connection and Isolation Panel [NHANES]    Frequency of Communication with Friends and Family: More than three times a week    Frequency of Social Gatherings with Friends and Family: More than three times a week    Attends Religious Services: Never    Database administrator or Organizations: No    Attends Engineer, structural: Never    Marital Status: Married    Review of systems General: negative for malaise, night sweats, fever, chills, weight loss Neck: Negative for lumps, goiter, pain and significant neck swelling Resp: Negative for cough, wheezing, dyspnea at rest CV: Negative for chest pain, leg swelling, palpitations, orthopnea GI: denies melena, hematochezia, nausea, vomiting, diarrhea, constipation, dysphagia, odyonophagia, early satiety or unintentional weight loss.  The remainder of the review of systems is noncontributory.  Physical Exam: There  were no vitals taken for this visit. General:   Alert and oriented. No distress noted.  Pleasant and cooperative.  Head:  Normocephalic and atraumatic. Eyes:  Conjuctiva clear without scleral icterus. Mouth:  Oral mucosa pink and moist. Good dentition. No lesions. Heart: Normal rate and rhythm, s1 and s2 heart sounds present.  Lungs: Clear lung sounds in all lobes. Respirations equal and unlabored. Abdomen:  +BS, soft, non-tender and non-distended. No rebound or guarding. No HSM or masses noted. Neurologic:  Alert and  oriented x4 Psych:  Alert and cooperative. Normal mood and affect.  Invalid input(s): "6 MONTHS"   ASSESSMENT: Jessica Strong is a 77 y.o. female presenting today for follow up of constipation and abdominal pain  Constipation: improved since starting Mounjaro. Not currently taking anything for constipation, having spontaneous regular BMs. Should continue with good water intake, diet high in fruits, veggies, whole grains  Upper abdominal pain: resolved. EGD with findings of duodenitis for which she was started on omeprazole 40 mg once daily.  She notes she is taking a medication for stomach but does not think it is omeprazole, however do not see any other prescriptions in her chart for medication for her stomach other than Amitiza which she is not taking.  Suspect she is taking omeprazole as she has had resolution in her abdominal pain.  I did ask her to confirm when she gets home what medication she is taking.  Would recommend she continue with PPI therapy at this time.   PLAN:  Continue with omeprazole 40mg  daily  2.  Continue good water intake, diet high in fruits, veggies, whole grains  All questions were answered, patient verbalized understanding and is in agreement with plan as outlined above.    Follow Up: 6 months   Spurgeon Gancarz L. Jeanmarie Hubert, MSN, APRN, AGNP-C Adult-Gerontology Nurse Practitioner [facility]   I have reviewed the note and agree  with the APP's assessment as described in this progress note  Katrinka Blazing, MD Gastroenterology and Hepatology [facility] Gastroenterology

## 2023-04-16 NOTE — Patient Instructions (Signed)
Please let me know which medication you are taking once you get home, if it is omeprazole, we will continue this once daily Please discuss bone pain with your PCP  Follow up 6 months

## 2023-04-17 ENCOUNTER — Telehealth (INDEPENDENT_AMBULATORY_CARE_PROVIDER_SITE_OTHER): Payer: Self-pay | Admitting: *Deleted

## 2023-04-17 NOTE — Telephone Encounter (Signed)
Pt.notified

## 2023-04-17 NOTE — Telephone Encounter (Signed)
Tried to call no answer

## 2023-04-17 NOTE — Telephone Encounter (Signed)
Pt seen yesterday and called today to confirm med she takes is omeprazole 40mg  tablets.   [phone]

## 2023-05-07 ENCOUNTER — Ambulatory Visit: Payer: Medicare Other | Admitting: Urology

## 2023-05-14 ENCOUNTER — Telehealth: Payer: Self-pay | Admitting: Family Medicine

## 2023-05-14 NOTE — Telephone Encounter (Signed)
 FMLA   Copied Noted Sleeved (put in provider box)  Call patient when ready to pick up forms.

## 2023-05-25 NOTE — Telephone Encounter (Signed)
Pt informed forms ready for pick up.

## 2023-06-02 ENCOUNTER — Other Ambulatory Visit: Payer: Self-pay | Admitting: Cardiology

## 2023-06-11 ENCOUNTER — Ambulatory Visit (HOSPITAL_COMMUNITY)
Admission: RE | Admit: 2023-06-11 | Discharge: 2023-06-11 | Disposition: A | Discharge status: Home / Self Care | Payer: Medicare Other | Source: Ambulatory Visit | Attending: Family Medicine | Admitting: Family Medicine

## 2023-06-11 ENCOUNTER — Ambulatory Visit (INDEPENDENT_AMBULATORY_CARE_PROVIDER_SITE_OTHER): Payer: Medicare Other | Admitting: Family Medicine

## 2023-06-11 ENCOUNTER — Encounter: Payer: Self-pay | Admitting: Family Medicine

## 2023-06-11 VITALS — BP 155/90 | HR 80 | Ht 60.0 in | Wt 271.1 lb

## 2023-06-11 DIAGNOSIS — R220 Localized swelling, mass and lump, head: Secondary | ICD-10-CM

## 2023-06-11 DIAGNOSIS — I10 Essential (primary) hypertension: Secondary | ICD-10-CM | POA: Diagnosis not present

## 2023-06-11 DIAGNOSIS — R0789 Other chest pain: Secondary | ICD-10-CM | POA: Insufficient documentation

## 2023-06-11 DIAGNOSIS — E7849 Other hyperlipidemia: Secondary | ICD-10-CM | POA: Diagnosis not present

## 2023-06-11 DIAGNOSIS — E559 Vitamin D deficiency, unspecified: Secondary | ICD-10-CM

## 2023-06-11 DIAGNOSIS — R7301 Impaired fasting glucose: Secondary | ICD-10-CM

## 2023-06-11 DIAGNOSIS — B351 Tinea unguium: Secondary | ICD-10-CM

## 2023-06-11 DIAGNOSIS — E038 Other specified hypothyroidism: Secondary | ICD-10-CM | POA: Diagnosis not present

## 2023-06-11 MED ORDER — TERBINAFINE HCL 1 % EX CREA: 1.0000 | TOPICAL_CREAM | Freq: Two times a day (BID) | CUTANEOUS | 1 refills | Status: DC

## 2023-06-11 NOTE — Assessment & Plan Note (Signed)
 She takes simvastatin  20 mg daily The patient was encouraged to make lifestyle changes, including avoiding simple carbohydrates such as cakes, sweet desserts, ice cream, soda (diet or regular), sweet tea, candies, chips, cookies, store-bought juices, excessive alcohol  (more than 1-2 drinks per day), lemonade, artificial sweeteners, donuts, coffee creamers, and sugar-free products. Additionally, reducing the consumption of greasy, fatty foods and increasing physical activity were recommended. The patient verbalized understanding and is aware of the plan of care.     Lab Results  Component Value Date   CHOL 124 03/13/2023   HDL 44 03/13/2023   LDLCALC 55 03/13/2023   TRIG 148 03/13/2023   CHOLHDL 2.8 03/13/2023

## 2023-06-11 NOTE — Patient Instructions (Addendum)
 I appreciate the opportunity to provide care to you today!    Follow up:  4 months   Labs: please stop by the lab during the week to get your blood drawn (CBC, CMP, TSH, Lipid profile, HgA1c, Vit D)  Please stop by [facility] to get chest x-ray  Attached with your AVS, you will find valuable resources for self-education. I highly recommend dedicating some time to thoroughly examine them.   Please continue to a heart-healthy diet and increase your physical activities. Try to exercise for at least five days a week.   Referral: Dermatology  It was a pleasure to see you and I look forward to continuing to work together on your health and well-being. Please do not hesitate to call the office if you need care or have questions about your care.  In case of emergency, please visit the Emergency Department for urgent care, or contact our clinic at [phone] to schedule an appointment. We're here to help you!   Have a wonderful day and week. With Gratitude, Shandreka Dante MSN, FNP-BC

## 2023-06-11 NOTE — Assessment & Plan Note (Addendum)
 The patient complains of a dull, achy sensation in her chest while lying down. The pain is not radiating. She denies fever, chills, chest tightness, shortness of breath (SOB), palpitations, and sweating. She reports that her symptoms last about 3-4 minutes and usually come and go. A chest X-ray will be obtained to rule out cardiovascular disease. Nonpharmacologic interventions were discussed, and the patient verbalized understanding

## 2023-06-11 NOTE — Progress Notes (Signed)
 Established Patient Office Visit  Subjective:  Patient ID: Jessica Strong  Jessica Strong, female    DOB: 10-23-1945  Age: 78 y.o. MRN: [id number]  CC:  Chief Complaint  Patient presents with   Follow-up    3 month    HPI Jessica  BREE Strong is a 78 y.o. female with past medical history of hypertension, hyperlipidemia, and hypothyroidism presents for f/u of  chronic medical conditions.  For the details of today's visit, please refer to the assessment and plan.    Past Medical History:  Diagnosis Date   Allergy    Anemia    Anxiety    Arthritis    Asthma    Cataract    cervical ca 1990   Depression    Diabetes (HCC)    Dyspnea    Hypercholesterolemia    Hypertension    Hypothyroidism    Myocardial infarction (HCC) 2005   Sleep apnea    on CPAP 14 mm PS since February 2017   Stroke [facility]) 2007   weakness on R side   Vitamin D  deficiency     Past Surgical History:  Procedure Laterality Date   ABDOMINAL HYSTERECTOMY     BARIATRIC SURGERY     BIOPSY  09/17/2020   Procedure: BIOPSY;  Surgeon: Urban Garden, MD;  Location: [facility];  Service: Gastroenterology;;   BIOPSY  12/08/2022   Procedure: BIOPSY;  Surgeon: Urban Garden, MD;  Location: [facility];  Service: Gastroenterology;;   BREAST BIOPSY Right    beningn   CARDIAC CATHETERIZATION N/A 12/24/2015   Procedure: Right/Left Heart Cath and Coronary Angiography;  Surgeon: Lucendia Rusk, MD;  Location: [facility] INVASIVE CV LAB;  Service: Cardiovascular;  Laterality: N/A;   COLONOSCOPY  2018   COLONOSCOPY WITH PROPOFOL  N/A 09/17/2020   Procedure: COLONOSCOPY WITH PROPOFOL ;  Surgeon: Urban Garden, MD;  Location: [facility];  Service: Gastroenterology;  Laterality: N/A;  9:00   ESOPHAGOGASTRODUODENOSCOPY (EGD) WITH PROPOFOL  N/A 09/17/2020   Procedure: ESOPHAGOGASTRODUODENOSCOPY (EGD) WITH PROPOFOL ;  Surgeon: Urban Garden, MD;  Location: [facility];  Service: Gastroenterology;   Laterality: N/A;   ESOPHAGOGASTRODUODENOSCOPY (EGD) WITH PROPOFOL  N/A 12/08/2022   Procedure: ESOPHAGOGASTRODUODENOSCOPY (EGD) WITH PROPOFOL ;  Surgeon: Urban Garden, MD;  Location: [facility];  Service: Gastroenterology;  Laterality: N/A;  2:30PM;ASA 3   HERNIA REPAIR     LEFT HEART CATH AND CORONARY ANGIOGRAPHY N/A 07/12/2017   Procedure: LEFT HEART CATH AND CORONARY ANGIOGRAPHY;  Surgeon: Arty Binning, MD;  Location: [facility] INVASIVE CV LAB;  Service: Cardiovascular;  Laterality: N/A;    Family History  Problem Relation Age of Onset   Cancer Mother    Colon cancer Mother    Diabetes Mother    Hypertension Father    Thyroid  disease Father    Thyroid  cancer Father    Diabetes Father    Esophageal cancer Father    Breast cancer Maternal Aunt    Colon cancer Maternal Aunt    Breast cancer Maternal Aunt    Colon cancer Maternal Grandmother    Stroke Maternal Grandfather    Stomach cancer Paternal Grandfather    Colon cancer Son    Colon polyps Neg Hx    Rectal cancer Neg Hx     Social History   Socioeconomic History   Marital status: Married    Spouse name: Not on file   Number of children: 2   Years of education: Not on file   Highest education  level: Not on file  Occupational History   Occupation: retired  Tobacco Use   Smoking status: Never    Passive exposure: Never   Smokeless tobacco: Never  Vaping Use   Vaping status: Never Used  Substance and Sexual Activity   Alcohol  use: Not Currently    Comment: occasionally   Drug use: No   Sexual activity: Never    Partners: Male  Other Topics Concern   Not on file  Social History Narrative   Not on file   Social Drivers of Health   Financial Resource Strain: Low Risk  (11/30/2022)   Overall Financial Resource Strain (CARDIA)    Difficulty of Paying Living Expenses: Not hard at all  Food Insecurity: No Food Insecurity (11/30/2022)   Hunger Vital Sign    Worried About Running Out of Food in the Last Year:  Never true    Ran Out of Food in the Last Year: Never true  Transportation Needs: No Transportation Needs (11/30/2022)   PRAPARE - Administrator, Civil Service (Medical): No    Lack of Transportation (Non-Medical): No  Physical Activity: Inactive (11/30/2022)   Exercise Vital Sign    Days of Exercise per Week: 0 days    Minutes of Exercise per Session: 0 min  Stress: No Stress Concern Present (11/30/2022)   [employer] of Occupational Health - Occupational Stress Questionnaire    Feeling of Stress : Not at all  Social Connections: Moderately Isolated (11/30/2022)   Social Connection and Isolation Panel [NHANES]    Frequency of Communication with Friends and Family: More than three times a week    Frequency of Social Gatherings with Friends and Family: More than three times a week    Attends Religious Services: Never    Database administrator or Organizations: No    Attends Banker Meetings: Never    Marital Status: Married  Catering manager Violence: Not At Risk (11/30/2022)   Humiliation, Afraid, Rape, and Kick questionnaire    Fear of Current or Ex-Partner: No    Emotionally Abused: No    Physically Abused: No    Sexually Abused: No    Outpatient Medications Prior to Visit  Medication Sig Dispense Refill   ACCU-CHEK AVIVA PLUS test strip Inject 1 strip as directed daily.  4   acetaminophen  (TYLENOL ) 500 MG tablet Take 500 mg by mouth every 6 (six) hours as needed for mild pain or headache. As needed.     albuterol  (VENTOLIN  HFA) 108 (90 Base) MCG/ACT inhaler Inhale 2 puffs into the lungs every 6 (six) hours as needed for wheezing or shortness of breath. 8 g 2   aspirin  325 MG tablet Take 325 mg by mouth daily.     bisacodyl (DULCOLAX) 5 MG EC tablet Take 5 mg by mouth. One every other day     Calcium  Carb-Cholecalciferol (CALCIUM  600-D PO) Take 1 tablet by mouth daily.     Continuous Glucose Sensor (DEXCOM G7 SENSOR) MISC by Does not apply route.      EPINEPHrine 0.3 mg/0.3 mL IJ SOAJ injection Inject 0.3 mg into the muscle as needed for anaphylaxis.     ferrous sulfate 325 (65 FE) MG tablet Take 325 mg by mouth daily with breakfast.     furosemide  (LASIX ) 20 MG tablet TAKE 1 TABLET BY MOUTH DAILY.  (MAY TAKE EXTRA 1 TABLET AS  NEEDED FOR SEVERE SWELLING) 200 tablet 2   HUMALOG  KWIKPEN 100 UNIT/ML KwikPen Inject 5-8 Units  into the skin 3 (three) times daily.     Lancets Thin MISC Inject 1 Stick into the skin daily.     levothyroxine  (SYNTHROID ) 88 MCG tablet Take 1 tablet (88 mcg total) by mouth daily before breakfast. 90 tablet 3   Multiple Vitamins-Minerals (MULTIVITAMIN ADULT) CHEW Chew 1 tablet by mouth daily at 12 noon.     NOVOFINE PEN NEEDLE 32G X 6 MM MISC Use to inject insulin  4 times daily 200 each 2   olmesartan  (BENICAR ) 20 MG tablet TAKE 1 TABLET BY MOUTH DAILY 100 tablet 2   omeprazole  (PRILOSEC) 40 MG capsule Take 1 capsule (40 mg total) by mouth daily. 90 capsule 3   potassium chloride  (KLOR-CON  M) 10 MEQ tablet Take 1 tablet (10 mEq total) by mouth daily. 60 tablet 5   simvastatin  (ZOCOR ) 20 MG tablet Take 1 tablet (20 mg total) by mouth at bedtime. 90 tablet 3   tirzepatide  (MOUNJARO ) 15 MG/0.5ML Pen Inject 15 mg into the skin once a week. 6 mL 3   TRESIBA  FLEXTOUCH 200 UNIT/ML FlexTouch Pen Inject 56 Units into the skin at bedtime. 45 mL 3   Vibegron  (GEMTESA ) 75 MG TABS Take 1 tablet (75 mg total) by mouth daily.     terbinafine  (LAMISIL  AT) 1 % cream Apply 1 Application topically 2 (two) times daily. 30 g 0   No facility-administered medications prior to visit.    Allergies  Allergen Reactions   Corn Oil Rash   Other Rash    PT STATES SHE IS ALLERGIC TO MOST FOODS-STATES IT WAS FOUND IN ALLERGY TESTING   Peanut Allergen Powder-Dnfp Rash   Peanut-Containing Drug Products Rash   Penicillins Rash    Has patient had a PCN reaction causing immediate rash, facial/tongue/throat swelling, SOB or lightheadedness with  hypotension: Yes Has patient had a PCN reaction causing severe rash involving mucus membranes or skin necrosis: No Has patient had a PCN reaction that required hospitalization No Has patient had a PCN reaction occurring within the last 10 years: No. Has not had in 40-50 years If all of the above answers are "NO", then may proceed with Cephalosporin use.    ROS Review of Systems  Constitutional:  Negative for chills and fever.  Eyes:  Negative for visual disturbance.  Respiratory:  Negative for chest tightness and shortness of breath.   Cardiovascular:  Positive for chest pain.  Neurological:  Negative for dizziness and headaches.      Objective:    Physical Exam HENT:     Head: Normocephalic.     Mouth/Throat:     Mouth: Mucous membranes are moist.  Cardiovascular:     Rate and Rhythm: Normal rate.     Heart sounds: Normal heart sounds.  Pulmonary:     Effort: Pulmonary effort is normal.     Breath sounds: Normal breath sounds.  Neurological:     Mental Status: She is alert.     BP (!) 155/90   Pulse 80   Ht 5' (1.524 m)   Wt 271 lb 1.9 oz (123 kg)   SpO2 96%   BMI 52.95 kg/m  Wt Readings from Last 3 Encounters:  06/11/23 271 lb 1.9 oz (123 kg)  04/16/23 273 lb 14.4 oz (124.2 kg)  03/13/23 279 lb 12.8 oz (126.9 kg)    Lab Results  Component Value Date   TSH 1.830 02/21/2023   Lab Results  Component Value Date   WBC 7.2 03/13/2023   HGB 10.3 (L)  03/13/2023   HCT 33.5 (L) 03/13/2023   MCV 83 03/13/2023   PLT 252 03/13/2023   Lab Results  Component Value Date   NA 144 03/13/2023   K 5.2 03/13/2023   CO2 19 (L) 03/13/2023   GLUCOSE 165 (H) 03/13/2023   BUN 26 03/13/2023   CREATININE 1.74 (H) 03/13/2023   BILITOT 0.2 03/13/2023   ALKPHOS 168 (H) 03/13/2023   AST 15 03/13/2023   ALT 11 03/13/2023   PROT 6.6 03/13/2023   ALBUMIN  4.0 03/13/2023   CALCIUM  9.2 03/13/2023   ANIONGAP 10 09/15/2020   EGFR 30 (L) 03/13/2023   Lab Results  Component  Value Date   CHOL 124 03/13/2023   Lab Results  Component Value Date   HDL 44 03/13/2023   Lab Results  Component Value Date   LDLCALC 55 03/13/2023   Lab Results  Component Value Date   TRIG 148 03/13/2023   Lab Results  Component Value Date   CHOLHDL 2.8 03/13/2023   Lab Results  Component Value Date   HGBA1C 7.7 (A) 03/13/2023      Assessment & Plan:  Essential hypertension Assessment & Plan: Uncontrolled BP The patient reports not taking her medications today. She takes Olmesartan  20 mg daily and Lasix  20 mg as needed for peripheral edema.Medication compliance was encouraged, and she was advised to check her ambulatory blood pressure readings daily and schedule an appointment if her BP exceeds 140/90 mmHg.A low-sodium diet of less than 2,300 mg daily is recommended, along with increased physical activity of moderate intensity, aiming for 150 minutes weekly. The patient is encouraged to continue these lifestyle modifications to help manage her blood pressure effectively.  BP Readings from Last 3 Encounters:  06/11/23 (!) 155/90  04/16/23 (!) 145/84  03/13/23 134/68       Other hyperlipidemia Assessment & Plan: She takes simvastatin  20 mg daily The patient was encouraged to make lifestyle changes, including avoiding simple carbohydrates such as cakes, sweet desserts, ice cream, soda (diet or regular), sweet tea, candies, chips, cookies, store-bought juices, excessive alcohol  (more than 1-2 drinks per day), lemonade, artificial sweeteners, donuts, coffee creamers, and sugar-free products. Additionally, reducing the consumption of greasy, fatty foods and increasing physical activity were recommended. The patient verbalized understanding and is aware of the plan of care.     Lab Results  Component Value Date   CHOL 124 03/13/2023   HDL 44 03/13/2023   LDLCALC 55 03/13/2023   TRIG 148 03/13/2023   CHOLHDL 2.8 03/13/2023    Orders: -     Lipid panel -      CMP14+EGFR -     CBC with Differential/Platelet  Other specified hypothyroidism Assessment & Plan: Encouraged to continue taking Synthroid  88 mcg daily. The patient is encouraged to follow up if experiencing symptoms of hypothyroidism, despite compliance with the current treatment regimen, such as fatigue, weight gain, constipation, dry skin, cold intolerance, hair loss, or depression.  Lab Results  Component Value Date   TSH 1.830 02/21/2023       Other chest pain Assessment & Plan: The patient complains of a dull, achy sensation in her chest while lying down. The pain is not radiating. She denies fever, chills, chest tightness, shortness of breath (SOB), palpitations, and sweating. She reports that her symptoms last about 3-4 minutes and usually come and go. A chest X-ray will be obtained to rule out cardiovascular disease. Nonpharmacologic interventions were discussed, and the patient verbalized understanding   Orders: -  DG Chest 2 View  Onychomycosis of right great toe -     Terbinafine  HCl; Apply 1 Application topically 2 (two) times daily.  Dispense: 42 g; Refill: 1  Lump on face -     Ambulatory referral to Dermatology  IFG (impaired fasting glucose) -     Hemoglobin A1c  Vitamin D  deficiency -     VITAMIN D  25 Hydroxy (Vit-D Deficiency, Fractures)  TSH (thyroid -stimulating hormone deficiency) -     TSH + free T4  Note: This chart has been completed using Engineer, civil (consulting) software, and while attempts have been made to ensure accuracy, certain words and phrases may not be transcribed as intended.    Follow-up: Return in about 4 months (around 10/09/2023).   Daven Montz, FNP

## 2023-06-11 NOTE — Assessment & Plan Note (Signed)
 Uncontrolled BP The patient reports not taking her medications today. She takes Olmesartan  20 mg daily and Lasix  20 mg as needed for peripheral edema.Medication compliance was encouraged, and she was advised to check her ambulatory blood pressure readings daily and schedule an appointment if her BP exceeds 140/90 mmHg.A low-sodium diet of less than 2,300 mg daily is recommended, along with increased physical activity of moderate intensity, aiming for 150 minutes weekly. The patient is encouraged to continue these lifestyle modifications to help manage her blood pressure effectively.  BP Readings from Last 3 Encounters:  06/11/23 (!) 155/90  04/16/23 (!) 145/84  03/13/23 134/68

## 2023-06-11 NOTE — Assessment & Plan Note (Signed)
 Encouraged to continue taking Synthroid  88 mcg daily. The patient is encouraged to follow up if experiencing symptoms of hypothyroidism, despite compliance with the current treatment regimen, such as fatigue, weight gain, constipation, dry skin, cold intolerance, hair loss, or depression.  Lab Results  Component Value Date   TSH 1.830 02/21/2023

## 2023-06-18 ENCOUNTER — Ambulatory Visit: Payer: Medicare Other | Admitting: Urology

## 2023-06-19 LAB — CMP14+EGFR
ALT: 14 IU/L (ref 0–32)
AST: 19 IU/L (ref 0–40)
Albumin: 4.2 g/dL (ref 3.8–4.8)
Alkaline Phosphatase: 155 IU/L — ABNORMAL HIGH (ref 44–121)
BUN/Creatinine Ratio: 14 (ref 12–28)
BUN: 28 mg/dL — ABNORMAL HIGH (ref 8–27)
Bilirubin Total: <0.2 mg/dL (ref 0.0–1.2)
CO2: 22 mmol/L (ref 20–29)
Calcium: 9.6 mg/dL (ref 8.7–10.3)
Chloride: 105 mmol/L (ref 96–106)
Creatinine, Ser: 2.06 mg/dL — ABNORMAL HIGH (ref 0.57–1.00)
Globulin, Total: 2.3 g/dL (ref 1.5–4.5)
Glucose: 178 mg/dL — ABNORMAL HIGH (ref 70–99)
Potassium: 5.5 mmol/L — ABNORMAL HIGH (ref 3.5–5.2)
Sodium: 141 mmol/L (ref 134–144)
Total Protein: 6.5 g/dL (ref 6.0–8.5)
eGFR: 25 mL/min/1.73 — ABNORMAL LOW

## 2023-06-19 LAB — CBC WITH DIFFERENTIAL/PLATELET
Basophils Absolute: 0.1 x10E3/uL (ref 0.0–0.2)
Basos: 1 %
EOS (ABSOLUTE): 0.3 x10E3/uL (ref 0.0–0.4)
Eos: 3 %
Hematocrit: 35.3 % (ref 34.0–46.6)
Hemoglobin: 10.9 g/dL — ABNORMAL LOW (ref 11.1–15.9)
Immature Grans (Abs): 0.1 x10E3/uL (ref 0.0–0.1)
Immature Granulocytes: 1 %
Lymphocytes Absolute: 2.6 x10E3/uL (ref 0.7–3.1)
Lymphs: 30 %
MCH: 25.5 pg — ABNORMAL LOW (ref 26.6–33.0)
MCHC: 30.9 g/dL — ABNORMAL LOW (ref 31.5–35.7)
MCV: 83 fL (ref 79–97)
Monocytes Absolute: 0.8 x10E3/uL (ref 0.1–0.9)
Monocytes: 9 %
Neutrophils Absolute: 5 x10E3/uL (ref 1.4–7.0)
Neutrophils: 56 %
Platelets: 299 x10E3/uL (ref 150–450)
RBC: 4.28 x10E6/uL (ref 3.77–5.28)
RDW: 13.9 % (ref 11.7–15.4)
WBC: 8.8 x10E3/uL (ref 3.4–10.8)

## 2023-06-19 LAB — LIPID PANEL
Chol/HDL Ratio: 3 ratio (ref 0.0–4.4)
Cholesterol, Total: 132 mg/dL (ref 100–199)
HDL: 44 mg/dL
LDL Chol Calc (NIH): 56 mg/dL (ref 0–99)
Triglycerides: 193 mg/dL — ABNORMAL HIGH (ref 0–149)
VLDL Cholesterol Cal: 32 mg/dL (ref 5–40)

## 2023-06-19 LAB — TSH+FREE T4
Free T4: 1.37 ng/dL (ref 0.82–1.77)
TSH: 1.6 u[IU]/mL (ref 0.450–4.500)

## 2023-06-19 LAB — VITAMIN D 25 HYDROXY (VIT D DEFICIENCY, FRACTURES): Vit D, 25-Hydroxy: 55.1 ng/mL (ref 30.0–100.0)

## 2023-06-19 LAB — HEMOGLOBIN A1C
Est. average glucose Bld gHb Est-mCnc: 214 mg/dL
Hgb A1c MFr Bld: 9.1 % — ABNORMAL HIGH (ref 4.8–5.6)

## 2023-06-21 ENCOUNTER — Ambulatory Visit: Payer: Medicare Other | Admitting: Nurse Practitioner

## 2023-06-22 IMAGING — MG MM DIGITAL SCREENING BILAT W/ TOMO AND CAD
6 of 10 series · 6 of 30 positions shown · non-contrast
Comparison: Previous exam(s).

CLINICAL DATA: Screening.

EXAM:
DIGITAL SCREENING BILATERAL MAMMOGRAM WITH TOMOSYNTHESIS AND CAD
TECHNIQUE: Bilateral screening digital craniocaudal and mediolateral oblique
mammograms were obtained. Bilateral screening digital breast
tomosynthesis was performed. The images were evaluated with
computer-aided detection.

[L MLO synth-2D (1 of 2)]
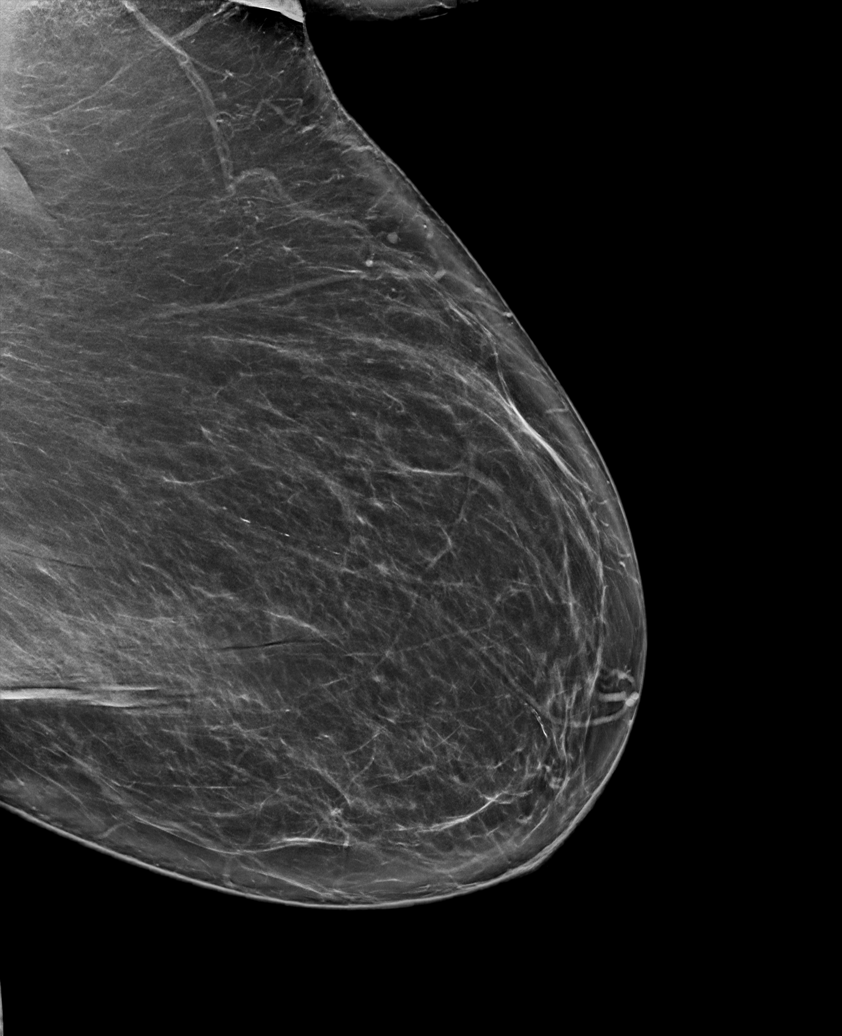

[R CC synth-2D]
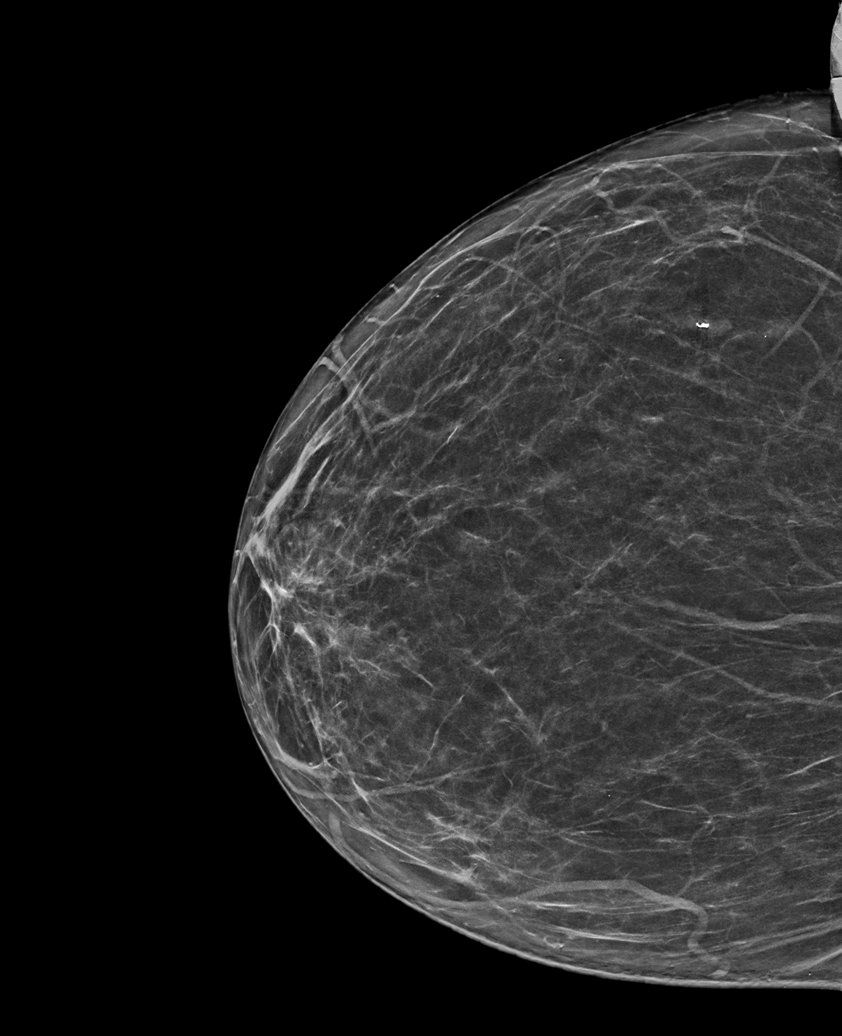

[L MLO synth-2D (2 of 2)]
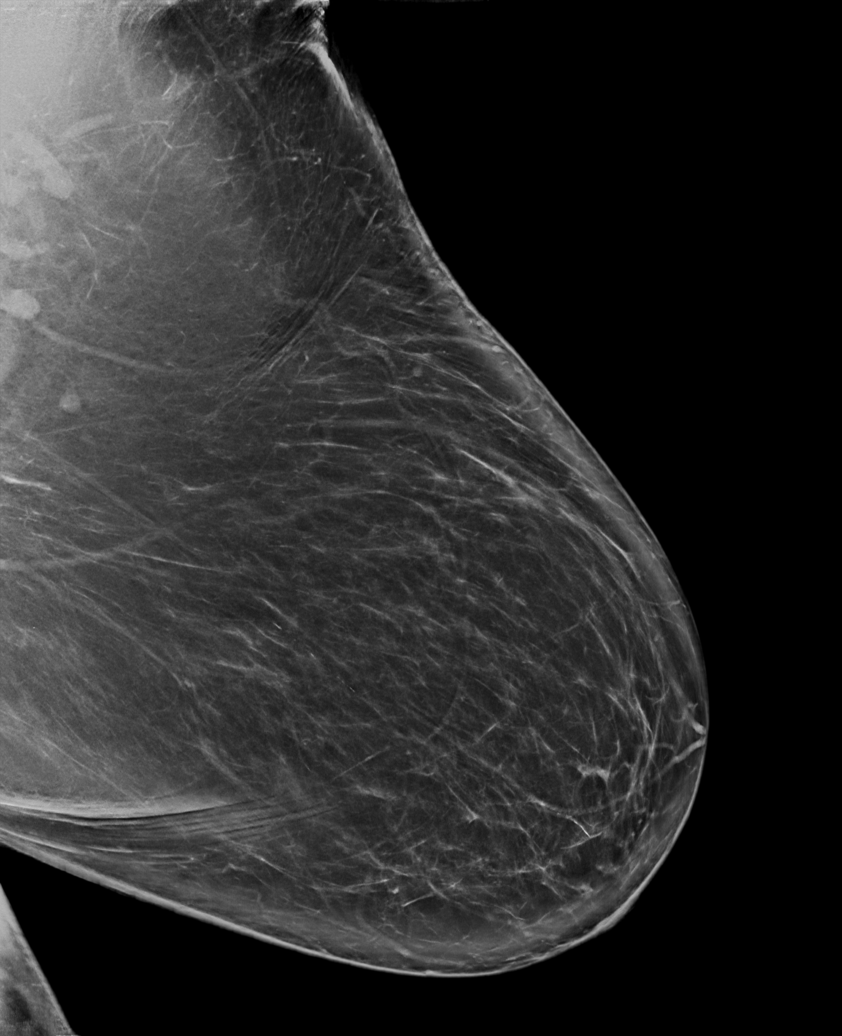

[R MLO synth-2D]
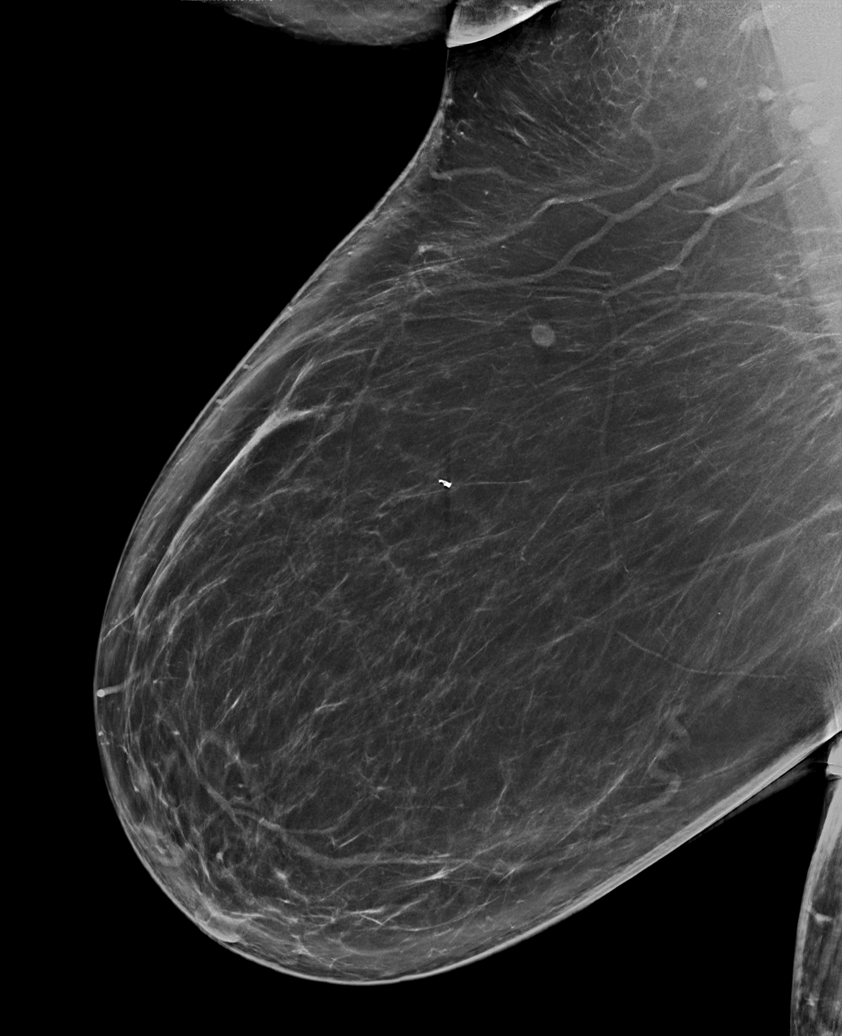

[L CC synth-2D]
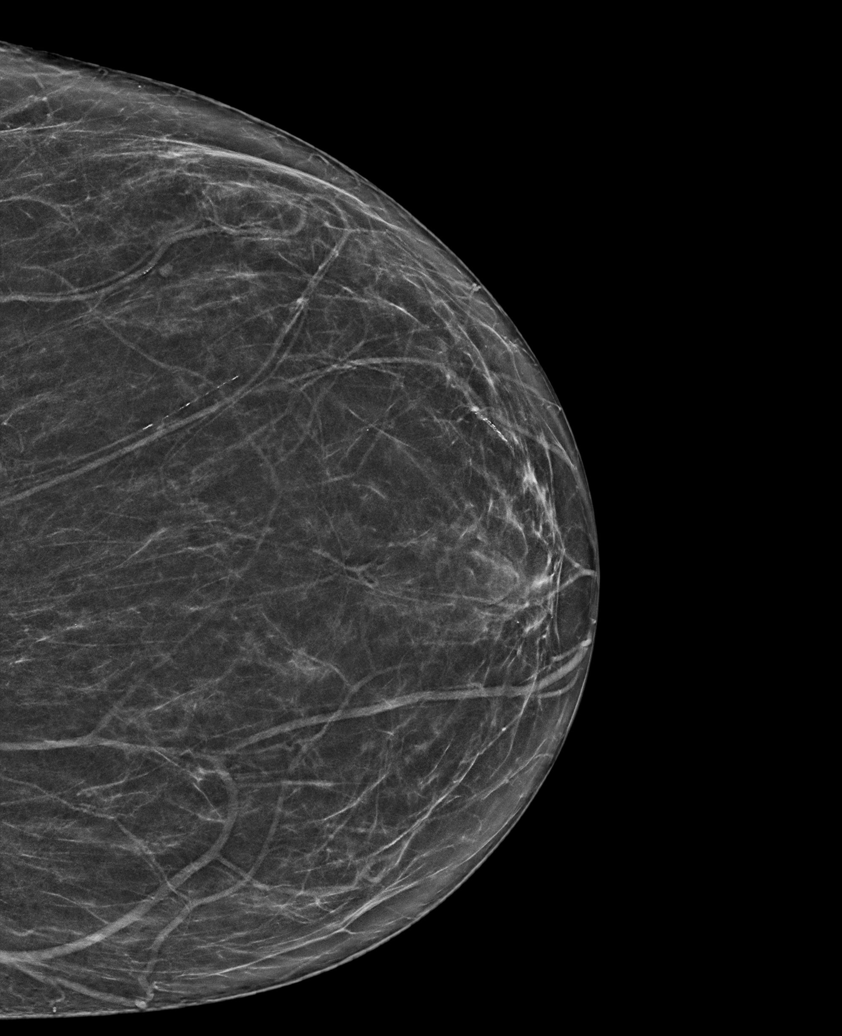

[L MLO tomo · tomo slice 49/98.0]
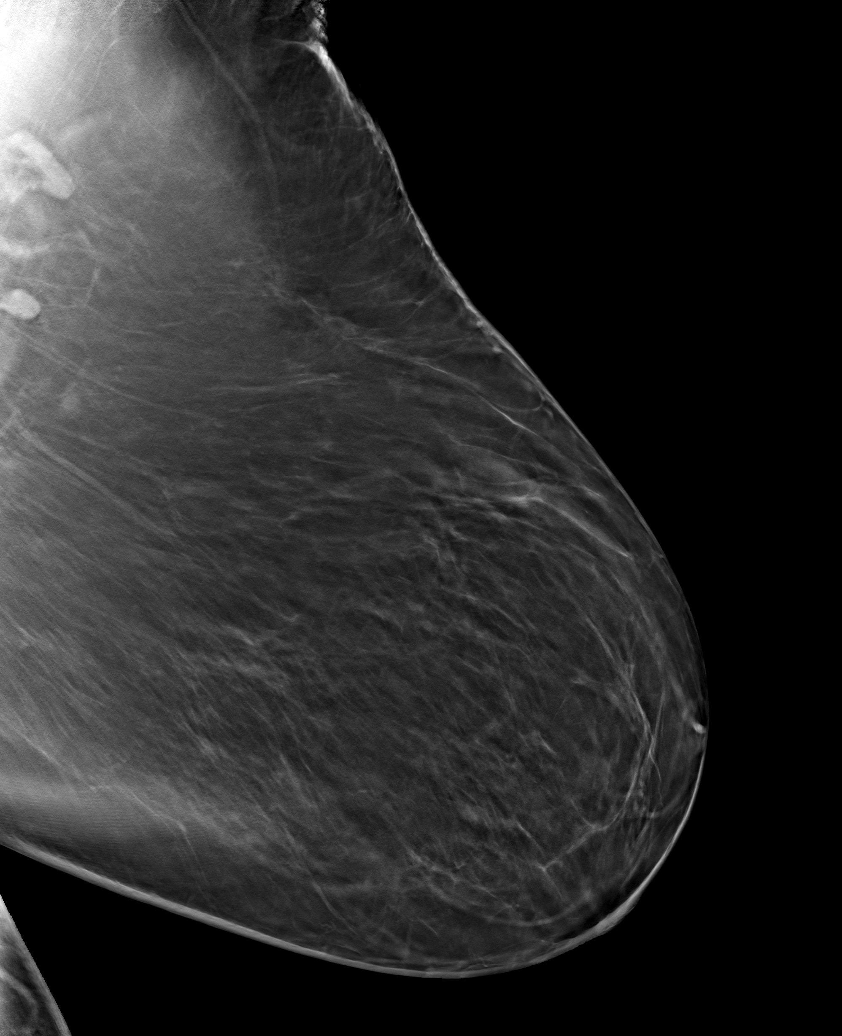

[6 of 30 positions shown; findings below may reference images not displayed]

ACR Breast Density Category b: There are scattered areas of
fibroglandular density.
FINDINGS: There are no findings suspicious for malignancy.
IMPRESSION: No mammographic evidence of malignancy. A result letter of this
screening mammogram will be mailed directly to the patient.

RECOMMENDATION:
Screening mammogram in one year. (Code:51-O-LD2)

BI-RADS CATEGORY  1: Negative.

## 2023-06-25 ENCOUNTER — Other Ambulatory Visit: Payer: Self-pay | Admitting: Family Medicine

## 2023-06-25 DIAGNOSIS — E1122 Type 2 diabetes mellitus with diabetic chronic kidney disease: Secondary | ICD-10-CM

## 2023-06-25 NOTE — Progress Notes (Signed)
 Hello Jessica Strong I completed Jessica Strong's Hemoglobin A1c test during her appointment. Kindly review the results

## 2023-06-25 NOTE — Progress Notes (Signed)
 Please inform the patient that a referral has been placed to nephrology for close monitoring and management of her kidney function.  Her Hemoglobin A1c has increased to 9.1, and her endocrinologist has been made aware of the results.  Inform the patient that her potassium levels are slightly elevated, and I recommend that she remains mindful of her potassium intake by limiting high-potassium foods such as bananas, oranges, potatoes, spinach, tomatoes, beans, and dairy products.  I recommend a follow-up for labs in one week to reassess her potassium levels and monitor any changes.

## 2023-06-26 ENCOUNTER — Other Ambulatory Visit: Payer: Self-pay | Admitting: Family Medicine

## 2023-06-26 DIAGNOSIS — E876 Hypokalemia: Secondary | ICD-10-CM

## 2023-06-26 NOTE — Progress Notes (Signed)
 Thank you for forwarding the results to me!  I will follow up with her.

## 2023-07-02 ENCOUNTER — Encounter: Payer: Self-pay | Admitting: Urology

## 2023-07-02 ENCOUNTER — Ambulatory Visit: Payer: Medicare Other | Admitting: Urology

## 2023-07-02 VITALS — BP 178/77 | HR 92

## 2023-07-02 DIAGNOSIS — N3281 Overactive bladder: Secondary | ICD-10-CM

## 2023-07-02 LAB — URINALYSIS, ROUTINE W REFLEX MICROSCOPIC
Bilirubin, UA: NEGATIVE
Glucose, UA: NEGATIVE
Ketones, UA: NEGATIVE
Leukocytes,UA: NEGATIVE
Nitrite, UA: NEGATIVE
Protein,UA: NEGATIVE
RBC, UA: NEGATIVE
Specific Gravity, UA: 1.025 (ref 1.005–1.030)
Urobilinogen, Ur: 0.2 mg/dL (ref 0.2–1.0)
pH, UA: 6 (ref 5.0–7.5)

## 2023-07-02 MED ORDER — GEMTESA 75 MG PO TABS: 1.0000 | ORAL_TABLET | Freq: Every day | ORAL | 11 refills | Status: AC

## 2023-07-02 NOTE — Progress Notes (Unsigned)
 Bladder Scan completed today.  Patient can void prior to the bladder scan. Bladder scan result: 0  Performed By: Beatriz Stallion. CMA  Additional notes-

## 2023-07-02 NOTE — Progress Notes (Unsigned)
 07/02/2023 2:10 PM   Jessica Strong [birth year] [id number]  Referring provider: Gilmore Laroche, FNP [address] [place],  [name] [address]  No chief complaint on file.   HPI:  F/u -   1) urinary frequency-she has frequency and urgency. She has incontinence. She wears pads and goes through several per day and night. She s/p caldera sling on 01/05/2018 with Dr. Gala Lewandowsky. No change in incontinence. Maybe worse. She leaks with urgency. Not so much with stress. No gross hematuria. She tried Myrbetriq. 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 She does have constipation.  CT abdomen and pelvis March 2023 benign GU tract.  Punctate left renal stone.    PVR was 5 ml. Cystoscopy benign Jul 2023.She tried [place] which worked well for a week but then was less than effective. Standing triggers. She c/o urgency and leakage.    She underwent Jul 2023 UDS - pvr 50, C 100 ml, sens 55, desire 86. Unstable with severe leakage. No SUI. Normal voiding. She tried tolterodine which seemed to work for a few weeks and then stopped.    She started combination therapy. Noc improved but still has continual leakage without awareness in the day. When she needs to go to the bathroom she is wet.    She underwent botox injection 100 U x 20 sites 06/16/2022. LUTS and incontinence improved. Pad usage has gone down. Noc improved. Her frequency and nocturia has returned. Incontinence worse. Jul 2024 Abd [place] - no hydro. Repeated BOTOX 100 U Oct 2024. F/u PVR 43 ml.   Today, seen for the above. Rx for [facility] Oct 2024. Did not help.    PMH: Past Medical History:  Diagnosis Date   Allergy    Anemia    Anxiety    Arthritis    Asthma    Cataract    cervical ca 1990   Depression    Diabetes (HCC)    Dyspnea    Hypercholesterolemia    Hypertension    Hypothyroidism    Myocardial infarction (HCC) 2005   Sleep apnea    on CPAP 14 mm PS since February 2017   Stroke [facility]) 2007   weakness on R side   Vitamin D deficiency      Surgical History: Past Surgical History:  Procedure Laterality Date   ABDOMINAL HYSTERECTOMY     BARIATRIC SURGERY     BIOPSY  09/17/2020   Procedure: BIOPSY;  Surgeon: Dolores Frame, MD;  [facility]: [facility];  Service: Gastroenterology;;   BIOPSY  12/08/2022   Procedure: BIOPSY;  Surgeon: Dolores Frame, MD;  [facility]: [facility];  Service: Gastroenterology;;   BREAST BIOPSY Right    beningn   CARDIAC CATHETERIZATION N/A 12/24/2015   Procedure: Right/Left Heart Cath and Coronary Angiography;  Surgeon: Corky Crafts, MD;  Location: [facility] INVASIVE CV LAB;  Service: Cardiovascular;  Laterality: N/A;   COLONOSCOPY  2018   COLONOSCOPY WITH PROPOFOL N/A 09/17/2020   Procedure: COLONOSCOPY WITH PROPOFOL;  Surgeon: Dolores Frame, MD;  [facility]: [facility];  Service: Gastroenterology;  Laterality: N/A;  9:00   ESOPHAGOGASTRODUODENOSCOPY (EGD) WITH PROPOFOL N/A 09/17/2020   Procedure: ESOPHAGOGASTRODUODENOSCOPY (EGD) WITH PROPOFOL;  Surgeon: Dolores Frame, MD;  [facility];  Service: Gastroenterology;  Laterality: N/A;   ESOPHAGOGASTRODUODENOSCOPY (EGD) WITH PROPOFOL N/A 12/08/2022   Procedure: ESOPHAGOGASTRODUODENOSCOPY (EGD) WITH PROPOFOL;  Surgeon: Dolores Frame, MD;  [facility]: [facility];  Service: Gastroenterology;  Laterality: N/A;  2:30PM;ASA 3   HERNIA REPAIR     LEFT HEART CATH AND CORONARY ANGIOGRAPHY N/A 07/12/2017   Procedure: LEFT HEART CATH AND CORONARY ANGIOGRAPHY;  Surgeon: Lyn Records, MD;  Location: [facility] INVASIVE CV LAB;  Service: Cardiovascular;  Laterality: N/A;    Home Medications:  Allergies as of 07/02/2023       Reactions   Corn Oil Rash   Other Rash   PT STATES SHE IS ALLERGIC TO MOST FOODS-STATES IT WAS FOUND IN ALLERGY TESTING   Peanut Allergen Powder-dnfp Rash   Peanut-containing Drug Products Rash   Penicillins Rash   Has patient had a PCN reaction causing immediate rash,  facial/tongue/throat swelling, SOB or lightheadedness with hypotension: Yes Has patient had a PCN reaction causing severe rash involving mucus membranes or skin necrosis: No Has patient had a PCN reaction that required hospitalization No Has patient had a PCN reaction occurring within the last 10 years: No. Has not had in 40-50 years If all of the above answers are "NO", then may proceed with Cephalosporin use.        Medication List        Accurate as of July 02, 2023  2:10 PM. If you have any questions, ask your nurse or doctor.          Accu-Chek Aviva Plus test strip Generic drug: glucose blood Inject 1 strip as directed daily.   acetaminophen 500 MG tablet Commonly known as: TYLENOL Take 500 mg by mouth every 6 (six) hours as needed for mild pain or headache. As needed.   albuterol 108 (90 Base) MCG/ACT inhaler Commonly known as: VENTOLIN HFA Inhale 2 puffs into the lungs every 6 (six) hours as needed for wheezing or shortness of breath.   aspirin 325 MG tablet Take 325 mg by mouth daily.   bisacodyl 5 MG EC tablet Commonly known as: DULCOLAX Take 5 mg by mouth. One every other day   CALCIUM 600-D PO Take 1 tablet by mouth daily.   Dexcom G7 Sensor Misc by Does not apply route.   EPINEPHrine 0.3 mg/0.3 mL Soaj injection Commonly known as: EPI-PEN Inject 0.3 mg into the muscle as needed for anaphylaxis.   ferrous sulfate 325 (65 FE) MG tablet Take 325 mg by mouth daily with breakfast.   furosemide 20 MG tablet Commonly known as: LASIX TAKE 1 TABLET BY MOUTH DAILY.  (MAY TAKE EXTRA 1 TABLET AS  NEEDED FOR SEVERE SWELLING)   Gemtesa 75 MG Tabs Generic drug: Vibegron Take 1 tablet (75 mg total) by mouth daily.   HumaLOG KwikPen 100 UNIT/ML KwikPen Generic drug: insulin lispro Inject 5-8 Units into the skin 3 (three) times daily.   Lancets Thin Misc Inject 1 Stick into the skin daily.   levothyroxine 88 MCG tablet Commonly known as: SYNTHROID Take 1  tablet (88 mcg total) by mouth daily before breakfast.   Multivitamin Adult Chew Chew 1 tablet by mouth daily at 12 noon.   Novofine Pen Needle 32G X 6 MM Misc Generic drug: Insulin Pen Needle Use to inject insulin 4 times daily   olmesartan 20 MG tablet Commonly known as: BENICAR TAKE 1 TABLET BY MOUTH DAILY   omeprazole 40 MG capsule Commonly known as: PRILOSEC Take 1 capsule (40 mg total) by mouth daily.   potassium chloride 10 MEQ tablet Commonly known as: KLOR-CON M TAKE 1 TABLET BY MOUTH DAILY   simvastatin 20 MG tablet Commonly known as: ZOCOR Take 1 tablet (20 mg total) by  mouth at bedtime.   terbinafine 1 % cream Commonly known as: LamISIL AT Apply 1 Application topically 2 (two) times daily.   tirzepatide 15 MG/0.5ML Pen Commonly known as: MOUNJARO Inject 15 mg into the skin once a week.   Evaristo Bury FlexTouch 200 UNIT/ML FlexTouch Pen Generic drug: insulin degludec Inject 56 Units into the skin at bedtime.        Allergies:  Allergies  Allergen Reactions   Corn Oil Rash   Other Rash    PT STATES SHE IS ALLERGIC TO MOST FOODS-STATES IT WAS FOUND IN ALLERGY TESTING   Peanut Allergen Powder-Dnfp Rash   Peanut-Containing Drug Products Rash   Penicillins Rash    Has patient had a PCN reaction causing immediate rash, facial/tongue/throat swelling, SOB or lightheadedness with hypotension: Yes Has patient had a PCN reaction causing severe rash involving mucus membranes or skin necrosis: No Has patient had a PCN reaction that required hospitalization No Has patient had a PCN reaction occurring within the last 10 years: No. Has not had in 40-50 years If all of the above answers are "NO", then may proceed with Cephalosporin use.    Family History: Family History  Problem Relation Age of Onset   Cancer Mother    Colon cancer Mother    Diabetes Mother    Hypertension Father    Thyroid disease Father    Thyroid cancer Father    Diabetes Father     Esophageal cancer Father    Breast cancer Maternal Aunt    Colon cancer Maternal Aunt    Breast cancer Maternal Aunt    Colon cancer Maternal Grandmother    Stroke Maternal Grandfather    Stomach cancer Paternal Grandfather    Colon cancer Son    Colon polyps Neg Hx    Rectal cancer Neg Hx     Social History:  reports that she has never smoked. She has never been exposed to tobacco smoke. She has never used smokeless tobacco. She reports that she does not currently use alcohol. She reports that she does not use drugs.   Physical Exam: There were no vitals taken for this visit.  Constitutional:  Alert and oriented, No acute distress. HEENT: [place] AT, moist mucus membranes.  Trachea midline, no masses. Cardiovascular: No clubbing, cyanosis, or edema. Respiratory: Normal respiratory effort, no increased work of breathing. GI: Abdomen is soft, nontender, nondistended, no abdominal masses GU: No CVA tenderness Skin: No rashes, bruises or suspicious lesions. Neurologic: Grossly intact, no focal deficits, moving all 4 extremities. Psychiatric: Normal mood and affect.  Laboratory Data: Lab Results  Component Value Date   WBC 8.8 06/18/2023   HGB 10.9 (L) 06/18/2023   HCT 35.3 06/18/2023   MCV 83 06/18/2023   PLT 299 06/18/2023    Lab Results  Component Value Date   CREATININE 2.06 (H) 06/18/2023    No results found for: "PSA"  No results found for: "TESTOSTERONE"  Lab Results  Component Value Date   HGBA1C 9.1 (H) 06/18/2023    Urinalysis    Component Value Date/Time   APPEARANCEUR Clear 02/19/2023 1405   GLUCOSEU Negative 02/19/2023 1405   BILIRUBINUR Negative 02/19/2023 1405   PROTEINUR Negative 02/19/2023 1405   UROBILINOGEN 0.2 03/06/2022 1416   NITRITE Negative 02/19/2023 1405   LEUKOCYTESUR Negative 02/19/2023 1405    Lab Results  Component Value Date   LABMICR Comment 02/19/2023   WBCUA 0-5 10/03/2021   LABEPIT 0-10 10/03/2021   BACTERIA Few 10/03/2021  Pertinent Imaging: N/a  Assessment & Plan:    1. Overactive bladder (Primary) Disc referral to see specialists in [place] or [facility]. She will start [facility].   - Urinalysis, Routine w reflex microscopic - BLADDER SCAN AMB NON-IMAGING   No follow-ups on file.  Jerilee Field, MD  [facility]  [address] [place], [name] [address] [phone]

## 2023-07-05 ENCOUNTER — Other Ambulatory Visit (HOSPITAL_COMMUNITY): Payer: Self-pay | Admitting: Nephrology

## 2023-07-05 DIAGNOSIS — N184 Chronic kidney disease, stage 4 (severe): Secondary | ICD-10-CM

## 2023-07-05 DIAGNOSIS — I129 Hypertensive chronic kidney disease with stage 1 through stage 4 chronic kidney disease, or unspecified chronic kidney disease: Secondary | ICD-10-CM

## 2023-07-05 DIAGNOSIS — D638 Anemia in other chronic diseases classified elsewhere: Secondary | ICD-10-CM

## 2023-07-10 ENCOUNTER — Encounter: Payer: Self-pay | Admitting: Nurse Practitioner

## 2023-07-10 ENCOUNTER — Ambulatory Visit: Payer: Medicare Other | Admitting: Nurse Practitioner

## 2023-07-10 VITALS — BP 130/80 | HR 74 | Ht 60.0 in | Wt 270.0 lb

## 2023-07-10 DIAGNOSIS — E782 Mixed hyperlipidemia: Secondary | ICD-10-CM

## 2023-07-10 DIAGNOSIS — E1122 Type 2 diabetes mellitus with diabetic chronic kidney disease: Secondary | ICD-10-CM

## 2023-07-10 DIAGNOSIS — E039 Hypothyroidism, unspecified: Secondary | ICD-10-CM | POA: Diagnosis not present

## 2023-07-10 DIAGNOSIS — Z794 Long term (current) use of insulin: Secondary | ICD-10-CM | POA: Diagnosis not present

## 2023-07-10 DIAGNOSIS — I10 Essential (primary) hypertension: Secondary | ICD-10-CM | POA: Diagnosis not present

## 2023-07-10 DIAGNOSIS — N1832 Chronic kidney disease, stage 3b: Secondary | ICD-10-CM

## 2023-07-10 DIAGNOSIS — E7849 Other hyperlipidemia: Secondary | ICD-10-CM

## 2023-07-10 DIAGNOSIS — Z808 Family history of malignant neoplasm of other organs or systems: Secondary | ICD-10-CM | POA: Diagnosis not present

## 2023-07-10 DIAGNOSIS — E785 Hyperlipidemia, unspecified: Secondary | ICD-10-CM

## 2023-07-10 DIAGNOSIS — Z7985 Long-term (current) use of injectable non-insulin antidiabetic drugs: Secondary | ICD-10-CM

## 2023-07-10 MED ORDER — TRESIBA FLEXTOUCH 200 UNIT/ML ~~LOC~~ SOPN
66.0000 [IU] | PEN_INJECTOR | Freq: Every day | SUBCUTANEOUS | 3 refills | Status: DC
Start: 2023-07-10 — End: 2024-03-03

## 2023-07-10 NOTE — Progress Notes (Signed)
 Endocrinology Follow Up Note       07/10/2023, 3:47 PM   Subjective:    Patient ID: Jessica Strong, female    DOB: June 08, 1945.  Jessica Strong is being seen in follow up after being seen in consultation for management of currently uncontrolled symptomatic diabetes requested by  Gilmore Laroche, FNP.   Past Medical History:  Diagnosis Date   Allergy    Anemia    Anxiety    Arthritis    Asthma    Cataract    cervical ca 1990   Depression    Diabetes (HCC)    Dyspnea    Hypercholesterolemia    Hypertension    Hypothyroidism    Myocardial infarction (HCC) 2005   Sleep apnea    on CPAP 14 mm PS since February 2017   Stroke [facility]) 2007   weakness on R side   Vitamin D deficiency     Past Surgical History:  Procedure Laterality Date   ABDOMINAL HYSTERECTOMY     BARIATRIC SURGERY     BIOPSY  09/17/2020   Procedure: BIOPSY;  Surgeon: Dolores Frame, MD;  [facility]: [facility];  Service: Gastroenterology;;   BIOPSY  12/08/2022   Procedure: BIOPSY;  Surgeon: Dolores Frame, MD;  Location: [facility];  Service: Gastroenterology;;   BREAST BIOPSY Right    beningn   CARDIAC CATHETERIZATION N/A 12/24/2015   Procedure: Right/Left Heart Cath and Coronary Angiography;  Surgeon: Corky Crafts, MD;  Location: [facility] INVASIVE CV LAB;  Service: Cardiovascular;  Laterality: N/A;   COLONOSCOPY  2018   COLONOSCOPY WITH PROPOFOL N/A 09/17/2020   Procedure: COLONOSCOPY WITH PROPOFOL;  Surgeon: Dolores Frame, MD;  Location: [facility];  Service: Gastroenterology;  Laterality: N/A;  9:00   ESOPHAGOGASTRODUODENOSCOPY (EGD) WITH PROPOFOL N/A 09/17/2020   Procedure: ESOPHAGOGASTRODUODENOSCOPY (EGD) WITH PROPOFOL;  Surgeon: Dolores Frame, MD;  [facility]: [facility];  Service: Gastroenterology;  Laterality: N/A;   ESOPHAGOGASTRODUODENOSCOPY (EGD) WITH PROPOFOL N/A 12/08/2022    Procedure: ESOPHAGOGASTRODUODENOSCOPY (EGD) WITH PROPOFOL;  Surgeon: Dolores Frame, MD;  Location: [facility];  Service: Gastroenterology;  Laterality: N/A;  2:30PM;ASA 3   HERNIA REPAIR     LEFT HEART CATH AND CORONARY ANGIOGRAPHY N/A 07/12/2017   Procedure: LEFT HEART CATH AND CORONARY ANGIOGRAPHY;  Surgeon: Lyn Records, MD;  Location: [facility] INVASIVE CV LAB;  Service: Cardiovascular;  Laterality: N/A;    Social History   Socioeconomic History   Marital status: Married    Spouse name: Not on file   Number of children: 2   Years of education: Not on file   Highest education level: Not on file  Occupational History   Occupation: retired  Tobacco Use   Smoking status: Never    Passive exposure: Never   Smokeless tobacco: Never  Vaping Use   Vaping status: Never Used  Substance and Sexual Activity   Alcohol use: Not Currently    Comment: occasionally   Drug use: No   Sexual activity: Never    Partners: Male  Other Topics Concern   Not on file  Social History Narrative   Not on file   Social  Drivers of Health   Financial Resource Strain: Low Risk  (11/30/2022)   Overall Financial Resource Strain (CARDIA)    Difficulty of Paying Living Expenses: Not hard at all  Food Insecurity: No Food Insecurity (11/30/2022)   Hunger Vital Sign    Worried About Running Out of Food in the Last Year: Never true    Ran Out of Food in the Last Year: Never true  Transportation Needs: No Transportation Needs (11/30/2022)   PRAPARE - Administrator, Civil Service (Medical): No    Lack of Transportation (Non-Medical): No  Physical Activity: Inactive (11/30/2022)   Exercise Vital Sign    Days of Exercise per Week: 0 days    Minutes of Exercise per Session: 0 min  Stress: No Stress Concern Present (11/30/2022)   [employer] of Occupational Health - Occupational Stress Questionnaire    Feeling of Stress : Not at all  Social Connections: Moderately Isolated (11/30/2022)    Social Connection and Isolation Panel [NHANES]    Frequency of Communication with Friends and Family: More than three times a week    Frequency of Social Gatherings with Friends and Family: More than three times a week    Attends Religious Services: Never    Database administrator or Organizations: No    Attends Engineer, structural: Never    Marital Status: Married    Family History  Problem Relation Age of Onset   Cancer Mother    Colon cancer Mother    Diabetes Mother    Hypertension Father    Thyroid disease Father    Thyroid cancer Father    Diabetes Father    Esophageal cancer Father    Breast cancer Maternal Aunt    Colon cancer Maternal Aunt    Breast cancer Maternal Aunt    Colon cancer Maternal Grandmother    Stroke Maternal Grandfather    Stomach cancer Paternal Grandfather    Colon cancer Son    Colon polyps Neg Hx    Rectal cancer Neg Hx     Outpatient Encounter Medications as of 07/10/2023  Medication Sig   ACCU-CHEK AVIVA PLUS test strip Inject 1 strip as directed daily.   acetaminophen (TYLENOL) 500 MG tablet Take 500 mg by mouth every 6 (six) hours as needed for mild pain or headache. As needed.   albuterol (VENTOLIN HFA) 108 (90 Base) MCG/ACT inhaler Inhale 2 puffs into the lungs every 6 (six) hours as needed for wheezing or shortness of breath.   aspirin 325 MG tablet Take 325 mg by mouth daily.   bisacodyl (DULCOLAX) 5 MG EC tablet Take 5 mg by mouth. One every other day   Calcium Carb-Cholecalciferol (CALCIUM 600-D PO) Take 1 tablet by mouth daily.   Continuous Glucose Sensor (DEXCOM G7 SENSOR) MISC by Does not apply route.   EPINEPHrine 0.3 mg/0.3 mL IJ SOAJ injection Inject 0.3 mg into the muscle as needed for anaphylaxis.   ferrous sulfate 325 (65 FE) MG tablet Take 325 mg by mouth daily with breakfast.   furosemide (LASIX) 20 MG tablet TAKE 1 TABLET BY MOUTH DAILY.  (MAY TAKE EXTRA 1 TABLET AS  NEEDED FOR SEVERE SWELLING)   Lancets Thin MISC  Inject 1 Stick into the skin daily.   levothyroxine (SYNTHROID) 88 MCG tablet Take 1 tablet (88 mcg total) by mouth daily before breakfast.   Multiple Vitamins-Minerals (MULTIVITAMIN ADULT) CHEW Chew 1 tablet by mouth daily at 12 noon.   NOVOFINE PEN NEEDLE  32G X 6 MM MISC Use to inject insulin 4 times daily   olmesartan (BENICAR) 20 MG tablet TAKE 1 TABLET BY MOUTH DAILY   omeprazole (PRILOSEC) 40 MG capsule Take 1 capsule (40 mg total) by mouth daily.   potassium chloride (KLOR-CON M) 10 MEQ tablet TAKE 1 TABLET BY MOUTH DAILY   simvastatin (ZOCOR) 20 MG tablet Take 1 tablet (20 mg total) by mouth at bedtime.   terbinafine (LAMISIL AT) 1 % cream Apply 1 Application topically 2 (two) times daily.   tirzepatide (MOUNJARO) 15 MG/0.5ML Pen Inject 15 mg into the skin once a week.   Vibegron (GEMTESA) 75 MG TABS Take 1 tablet (75 mg total) by mouth daily.   [DISCONTINUED] TRESIBA FLEXTOUCH 200 UNIT/ML FlexTouch Pen Inject 56 Units into the skin at bedtime.   TRESIBA FLEXTOUCH 200 UNIT/ML FlexTouch Pen Inject 66 Units into the skin daily at 10 pm.   [DISCONTINUED] HUMALOG KWIKPEN 100 UNIT/ML KwikPen Inject 5-8 Units into the skin 3 (three) times daily. (Patient not taking: Reported on 07/10/2023)   No facility-administered encounter medications on file as of 07/10/2023.    ALLERGIES: Allergies  Allergen Reactions   Corn Oil Rash   Other Rash    PT STATES SHE IS ALLERGIC TO MOST FOODS-STATES IT WAS FOUND IN ALLERGY TESTING   Peanut Allergen Powder-Dnfp Rash   Peanut-Containing Drug Products Rash   Penicillins Rash    Has patient had a PCN reaction causing immediate rash, facial/tongue/throat swelling, SOB or lightheadedness with hypotension: Yes Has patient had a PCN reaction causing severe rash involving mucus membranes or skin necrosis: No Has patient had a PCN reaction that required hospitalization No Has patient had a PCN reaction occurring within the last 10 years: No. Has not had in 40-50  years If all of the above answers are "NO", then may proceed with Cephalosporin use.    VACCINATION STATUS: Immunization History  Administered Date(s) Administered   Moderna Sars-Covid-2 Vaccination 07/11/2019, 08/08/2019, 08/11/2020   PNEUMOCOCCAL CONJUGATE-20 02/21/2022   Zoster Recombinant(Shingrix) 08/17/2022, 10/02/2022    Diabetes She presents for her follow-up diabetic visit. She has type 2 diabetes mellitus. Onset time: Diagnosed at approx age of 79. Her disease course has been stable. There are no hypoglycemic associated symptoms. Associated symptoms include fatigue and foot paresthesias. Pertinent negatives for diabetes include no polydipsia, no polyuria and no weight loss. There are no hypoglycemic complications. Symptoms are improving. Diabetic complications include a CVA, heart disease, nephropathy and peripheral neuropathy. Risk factors for coronary artery disease include diabetes mellitus, dyslipidemia, family history, obesity, hypertension, post-menopausal and sedentary lifestyle. Current diabetic treatment includes intensive insulin program (and Mounjaro). She is compliant with treatment most of the time. Her weight is decreasing steadily. She is following a generally healthy diet. When asked about meal planning, she reported none. She has not had a previous visit with a dietitian. She rarely participates in exercise. Her home blood glucose trend is fluctuating minimally. Her breakfast blood glucose range is generally 110-130 mg/dl. Her overall blood glucose range is >200 mg/dl. (She presents today, accompanied by her husband, with her CGM showing slightly above target glycemic profile overall.  Her most recent A1c on 2/17 was 9.1%, increasing from last visit of 7.7%.  Analysis of her CGM shows TIR 20%, TAR 80%, TBR 0% with a GMI of 8.4%. She has had some medical set backs.  She is following with kidney specialist for worsening kidney function, also she is seeing urologist as well.) An  ACE  inhibitor/angiotensin II receptor blocker is being taken. She does not see a podiatrist.Eye exam is current.  Hypertension This is a chronic problem. The current episode started more than 1 year ago. The problem has been resolved since onset. The problem is controlled. Agents associated with hypertension include thyroid hormones. Risk factors for coronary artery disease include sedentary lifestyle, obesity, post-menopausal state, dyslipidemia, diabetes mellitus and family history. Past treatments include angiotensin blockers and diuretics. The current treatment provides moderate improvement. Compliance problems include diet and exercise.  Hypertensive end-organ damage includes kidney disease, CAD/MI and CVA. Identifiable causes of hypertension include a thyroid problem.     Review of systems  Constitutional: + stable body weight, current Body mass index is 52.73 kg/m., + fatigue, no subjective hyperthermia, no subjective hypothermia Eyes: no blurry vision, no xerophthalmia ENT: no sore throat, no nodules palpated in throat, no dysphagia/odynophagia, no hoarseness Cardiovascular: no chest pain, no shortness of breath, no palpitations, + leg swelling Respiratory: no cough, + shortness of breath with exertion Gastrointestinal: no nausea/vomiting/diarrhea Musculoskeletal: no muscle/joint aches, right side weakness from previous CVA Skin: no rashes, no hyperemia Neurological: no tremors, no dizziness Psychiatric: + significant depression, no anxiety  Objective:     BP 130/80 (BP Location: Left Arm, Patient Position: Sitting, Cuff Size: Large)   Pulse 74   Ht 5' (1.524 m)   Wt 270 lb (122.5 kg)   BMI 52.73 kg/m   Wt Readings from Last 3 Encounters:  07/10/23 270 lb (122.5 kg)  06/11/23 271 lb 1.9 oz (123 kg)  04/16/23 273 lb 14.4 oz (124.2 kg)     BP Readings from Last 3 Encounters:  07/10/23 130/80  07/02/23 (!) 178/77  06/11/23 (!) 155/90     Physical Exam-  Limited  Constitutional:  Body mass index is 52.73 kg/m. , not in acute distress, normal state of mind Eyes:  EOMI, no exophthalmos Musculoskeletal: minimal right sided weakness- residual from previous stroke Skin:  no rashes, no hyperemia Neurological: no tremor with outstretched hands  Diabetic Foot Exam - Simple   No data filed     CMP ( most recent) CMP     Component Value Date/Time   NA 141 06/18/2023 1047   K 5.5 (H) 06/18/2023 1047   CL 105 06/18/2023 1047   CO2 22 06/18/2023 1047   GLUCOSE 178 (H) 06/18/2023 1047   GLUCOSE 172 (H) 09/15/2020 1037   BUN 28 (H) 06/18/2023 1047   CREATININE 2.06 (H) 06/18/2023 1047   CALCIUM 9.6 06/18/2023 1047   PROT 6.5 06/18/2023 1047   ALBUMIN 4.2 06/18/2023 1047   AST 19 06/18/2023 1047   ALT 14 06/18/2023 1047   ALKPHOS 155 (H) 06/18/2023 1047   BILITOT <0.2 06/18/2023 1047   GFRNONAA 32 (L) 09/15/2020 1037   GFRAA 48 (L) 07/09/2017 1112     Diabetic Labs (most recent): Lab Results  Component Value Date   HGBA1C 9.1 (H) 06/18/2023   HGBA1C 7.7 (A) 03/13/2023   HGBA1C 8.8 (A) 12/11/2022   MICROALBUR 150 07/11/2021     Lipid Panel ( most recent) Lipid Panel     Component Value Date/Time   CHOL 132 06/18/2023 1047   TRIG 193 (H) 06/18/2023 1047   HDL 44 06/18/2023 1047   CHOLHDL 3.0 06/18/2023 1047   LDLCALC 56 06/18/2023 1047   LABVLDL 32 06/18/2023 1047      Lab Results  Component Value Date   TSH 1.600 06/18/2023   TSH 1.830 02/21/2023   TSH 3.020  11/27/2022   TSH 3.330 09/06/2022   TSH 3.880 04/17/2022   TSH 5.750 (H) 11/04/2021   TSH 1.533 09/29/2020   FREET4 1.37 06/18/2023   FREET4 1.35 02/21/2023   FREET4 1.29 11/27/2022   FREET4 1.23 09/06/2022   FREET4 1.50 04/17/2022   FREET4 1.06 11/04/2021           Assessment & Plan:   1) Type 2 diabetes mellitus with stage 3b chronic kidney disease, with long-term current use of insulin (HCC)  She presents today, accompanied by her husband,  with her CGM showing slightly above target glycemic profile overall.  Her most recent A1c on 2/17 was 9.1%, increasing from last visit of 7.7%.  Analysis of her CGM shows TIR 20%, TAR 80%, TBR 0% with a GMI of 8.4%. She has had some medical set backs.  She is following with kidney specialist for worsening kidney function, also she is seeing urologist as well.  - Jessica Bixler has currently uncontrolled symptomatic type 2 DM since 78 years of age.   -Recent labs reviewed.  - I had a long discussion with her about the progressive nature of diabetes and the pathology behind its complications. -her diabetes is complicated by CVA, MI, CAD, OSA, CKD and she remains at a high risk for more acute and chronic complications which include CAD, CVA, CKD, retinopathy, and neuropathy. These are all discussed in detail with her.  The following Lifestyle Medicine recommendations according to American College of Lifestyle Medicine [facility]) were discussed and offered to patient and she agrees to start the journey:  A. Whole Foods, Plant-based plate comprising of fruits and vegetables, plant-based proteins, whole-grain carbohydrates was discussed in detail with the patient.   A list for source of those nutrients were also provided to the patient.  Patient will use only water or unsweetened tea for hydration. B.  The need to stay away from risky substances including alcohol, smoking; obtaining 7 to 9 hours of restorative sleep, at least 150 minutes of moderate intensity exercise weekly, the importance of healthy social connections,  and stress reduction techniques were discussed. C.  A full color page of  Calorie density of various food groups per pound showing examples of each food groups was provided to the patient.  - Nutritional counseling repeated at each appointment due to patients tendency to fall back in to old habits.  - The patient admits there is a room for improvement in their diet and drink choices. -   Suggestion is made for the patient to avoid simple carbohydrates from their diet including Cakes, Sweet Desserts / Pastries, Ice Cream, Soda (diet and regular), Sweet Tea, Candies, Chips, Cookies, Sweet Pastries, Store Bought Juices, Alcohol in Excess of 1-2 drinks a day, Artificial Sweeteners, Coffee Creamer, and "Sugar-free" Products. This will help patient to have stable blood glucose profile and potentially avoid unintended weight gain.   - I encouraged the patient to switch to unprocessed or minimally processed complex starch and increased protein intake (animal or plant source), fruits, and vegetables.   - Patient is advised to stick to a routine mealtimes to eat 3 meals a day and avoid unnecessary snacks (to snack only to correct hypoglycemia).  - she will be scheduled with Norm Salt, RDN, CDE for diabetes education.  - I have approached her with the following individualized plan to manage her diabetes and patient agrees:   -She is advised to increase Tresiba to 66 units SQ nightly and continue Mounjaro 15 mg SQ weekly.  She can stay off prandial insulin for now.  -she is encouraged to continue monitoring glucose 4 times daily (using her CGM), before meals and before bed, and to call the clinic if she has readings less than 70 or above 300 for 3 tests in a row.    - she is warned not to take insulin without proper monitoring per orders. - Adjustment parameters are given to her for hypo and hyperglycemia in writing.  - her Metformin was discontinued, risk outweighs benefit for this patient given recent renal decline.  - Specific targets for  A1c; LDL, HDL, and Triglycerides were discussed with the patient.  2) Blood Pressure /Hypertension:  her blood pressure is controlled to target for her age.   she is advised to continue her current medications including Benicar 20 mg po daily, Lasix 20 mg po daily as needed.  3) Lipids/Hyperlipidemia:    Her most recent lipid panel from 09/06/22  shows controlled LDL of 52.  She is advised to continue Zocor 20 mg po daily at bedtime.  Labs ordered by PCP.  4)  Weight/Diet:  her Body mass index is 52.73 kg/m.  -  clearly complicating her diabetes care.   she is a candidate for weight loss. I discussed with her the fact that loss of 5 - 10% of her  current body weight will have the most impact on her diabetes management.  Exercise, and detailed carbohydrates information provided  -  detailed on discharge instructions.  5) Hypothyroidism Her previsit TFTs are consistent with appropriate hormone replacement.  She is advised to continue Levothyroxine 88 mcg po daily before breakfast.   The correct intake of thyroid hormone (Levothyroxine, Synthroid), is on empty stomach first thing in the morning, with water, separated by at least 30 minutes from breakfast and other medications,  and separated by more than 4 hours from calcium, iron, multivitamins, acid reflux medications (PPIs).  - This medication is a life-long medication and will be needed to correct thyroid hormone imbalances for the rest of your life.  The dose may change from time to time, based on thyroid blood work.  - It is extremely important to be consistent taking this medication, near the same time each morning.  -AVOID TAKING PRODUCTS CONTAINING BIOTIN (commonly found in Hair, Skin, Nails vitamins) AS IT INTERFERES WITH THE VALIDITY OF THYROID FUNCTION BLOOD TESTS.  6) Chronic Care/Health Maintenance: -she is on ACEI/ARB and not on Statin medications and is encouraged to initiate and continue to follow up with Ophthalmology, Dentist, Podiatrist at least yearly or according to recommendations, and advised to stay away from smoking. I have recommended yearly flu vaccine and pneumonia vaccine at least every 5 years; moderate intensity exercise for up to 150 minutes weekly; and sleep for at least 7 hours a day.  - she is advised to maintain close follow up with Gilmore Laroche, FNP  for primary care needs, as well as her other providers for optimal and coordinated care.     I spent  38  minutes in the care of the patient today including review of labs from CMP, Lipids, Thyroid Function, Hematology (current and previous including abstractions from other facilities); face-to-face time discussing  her blood glucose readings/logs, discussing hypoglycemia and hyperglycemia episodes and symptoms, medications doses, her options of short and long term treatment based on the latest standards of care / guidelines;  discussion about incorporating lifestyle medicine;  and documenting the encounter. Risk reduction counseling performed per USPSTF guidelines to reduce  obesity and cardiovascular risk factors.     Please refer to Patient Instructions for Blood Glucose Monitoring and Insulin/Medications Dosing Guide"  in media tab for additional information. Please  also refer to " Patient Self Inventory" in the Media  tab for reviewed elements of pertinent patient history.  Jessica Strong participated in the discussions, expressed understanding, and voiced agreement with the above plans.  All questions were answered to her satisfaction. she is encouraged to contact clinic should she have any questions or concerns prior to her return visit.     Follow up plan: - Return in about 3 months (around 10/10/2023) for Diabetes F/U with A1c in office, Bring meter and logs, No previsit labs.  Ronny Bacon, [facility] [address] [place], [name] [address] Phone: [phone] Fax: [phone]  07/10/2023, 3:47 PM

## 2023-07-12 LAB — CMP14+EGFR
ALT: 11 IU/L (ref 0–32)
AST: 17 IU/L (ref 0–40)
Albumin: 4.2 g/dL (ref 3.8–4.8)
Alkaline Phosphatase: 158 IU/L — ABNORMAL HIGH (ref 44–121)
BUN/Creatinine Ratio: 16 (ref 12–28)
BUN: 29 mg/dL — ABNORMAL HIGH (ref 8–27)
Bilirubin Total: <0.2 mg/dL (ref 0.0–1.2)
CO2: 20 mmol/L (ref 20–29)
Calcium: 9.4 mg/dL (ref 8.7–10.3)
Chloride: 104 mmol/L (ref 96–106)
Creatinine, Ser: 1.86 mg/dL — ABNORMAL HIGH (ref 0.57–1.00)
Globulin, Total: 2.4 g/dL (ref 1.5–4.5)
Glucose: 202 mg/dL — ABNORMAL HIGH (ref 70–99)
Potassium: 5.3 mmol/L — ABNORMAL HIGH (ref 3.5–5.2)
Sodium: 140 mmol/L (ref 134–144)
Total Protein: 6.6 g/dL (ref 6.0–8.5)
eGFR: 28 mL/min/{1.73_m2} — ABNORMAL LOW (ref >59)

## 2023-07-14 ENCOUNTER — Encounter: Payer: Self-pay | Admitting: Family Medicine

## 2023-07-28 NOTE — Progress Notes (Unsigned)
 Jessica Strong, female    DOB: 1946/02/16,     MRN: [id number]   Brief patient profile:  61 yobf never smoker worked as RT  And started having year round rhinitis in her 30s - 40's much better and no flares off p 10 year with new onset doe age 78 and progressively worse since with cardiac eval by [facility] neg so referred to pulmonary clinic in [facility]  09/29/2020 by Dr  Sherril Croon.     Wt was around 214 when last could walk fine per husband but in 10/2015 wt 220 c/o sob "with any exertion" with pfts wnl    History of Present Illness  09/29/2020  Pulmonary/ 1st office eval/ Jessica Strong / [place] [facility]  Chief Complaint  Patient presents with   Pulmonary Consult    Referred by Dr Hulen Skains. Pt c/o DOE for 2 years, progressively worsening to the point she is winded walking room to room.   Dyspnea:  Stopped working  2007 at same wt as is now, not limited by breathing. By 2017 was being eval for sob with nl pfts performed on 12/10/15. Last walked streets in front of her house  x 3-4 years ago Last grocery store [employer] pushing basket one week prior to OV    Cough: dry hs  Sleep: bed is flat one pillow / some noct choking on cpap - Rx  per [facility]as  SABA use: none  Rec  Stop lisinopril and start olmesartan 20/12.5 in its place Omeprazole 40 mg   Take  30-60 min before first meal of the day  Labs ok  / did not go for cxr   11/10/2020  f/u ov/[place] [facility]/Jessica Strong re: ? Acei case f/u / needs cxr  Chief Complaint  Patient presents with   Follow-up    Breathing is unchanged since the last visit. No new co's.    Dyspnea:  no change doe room to room  Cough: better / no choking office   Sleeping: fine  now  new / flat bed  SABA use: none  02: none  Covid status:  vax x 4  Lung cancer screening: never smoker  Worse leg swelling x  6 months  Rec      07/31/2023  f/u ov/[place] office/Jessica Strong re: *** maint on ***  No chief complaint on file.   Dyspnea:  *** Cough: *** Sleeping: ***   resp cc  SABA  use: *** 02: ***  Lung cancer screening: ***   No obvious day to day or daytime variability or assoc excess/ purulent sputum or mucus plugs or hemoptysis or cp or chest tightness, subjective wheeze or overt sinus or hb symptoms.    Also denies any obvious fluctuation of symptoms with weather or environmental changes or other aggravating or alleviating factors except as outlined above   No unusual exposure hx or h/o childhood pna/ asthma or knowledge of premature birth.  Current Allergies, Complete Past Medical History, Past Surgical History, Family History, and Social History were reviewed in [employer] record.  ROS  The following are not active complaints unless bolded Hoarseness, sore throat, dysphagia, dental problems, itching, sneezing,  nasal congestion or discharge of excess mucus or purulent secretions, ear ache,   fever, chills, sweats, unintended wt loss or wt gain, classically pleuritic or exertional cp,  orthopnea pnd or arm/hand swelling  or leg swelling, presyncope, palpitations, abdominal pain, anorexia, nausea, vomiting, diarrhea  or change in bowel habits or change in bladder habits, change in  stools or change in urine, dysuria, hematuria,  rash, arthralgias, visual complaints, headache, numbness, weakness or ataxia or problems with walking or coordination,  change in mood or  memory.        No outpatient medications have been marked as taking for the 07/31/23 encounter (Appointment) with Nyoka Cowden, MD.              Past Medical History:  Diagnosis Date   Allergy    Anemia    Anxiety    Arthritis    Cataract    cervical ca 1990   Depression    Diabetes (HCC)    Hypercholesterolemia    Hypertension    Myocardial infarction [facility])    Sleep apnea    on CPAP 14 mm PS since February 2017   Stroke [facility])    weakness on R side   Vitamin D deficiency      Objective:    Wts  07/31/2023         ***   11/10/20 238 lb (108 kg)  11/05/20  237 lb 9.6 oz (107.8 kg)  10/13/20 239 lb 12.8 oz (108.8 kg)    Vital signs reviewed  07/31/2023  - Note at rest 02 sats  ***% on ***   General appearance:    ***               Assessment

## 2023-07-30 ENCOUNTER — Ambulatory Visit (HOSPITAL_COMMUNITY)
Admission: RE | Admit: 2023-07-30 | Discharge: 2023-07-30 | Disposition: A | Discharge status: Home / Self Care | Source: Ambulatory Visit | Attending: Nephrology | Admitting: Nephrology

## 2023-07-30 DIAGNOSIS — N184 Chronic kidney disease, stage 4 (severe): Secondary | ICD-10-CM | POA: Insufficient documentation

## 2023-07-30 DIAGNOSIS — D638 Anemia in other chronic diseases classified elsewhere: Secondary | ICD-10-CM | POA: Insufficient documentation

## 2023-07-30 DIAGNOSIS — I129 Hypertensive chronic kidney disease with stage 1 through stage 4 chronic kidney disease, or unspecified chronic kidney disease: Secondary | ICD-10-CM | POA: Diagnosis present

## 2023-07-31 ENCOUNTER — Ambulatory Visit: Payer: Medicare Other | Admitting: Internal Medicine

## 2023-07-31 ENCOUNTER — Encounter: Payer: Self-pay | Admitting: Internal Medicine

## 2023-07-31 VITALS — BP 145/82 | HR 82 | Ht 60.0 in | Wt 269.0 lb

## 2023-07-31 DIAGNOSIS — R0609 Other forms of dyspnea: Secondary | ICD-10-CM | POA: Diagnosis not present

## 2023-07-31 NOTE — Patient Instructions (Addendum)
 Also  Ok to try albuterol 15 min before an activity (on alternating days)  that you know would usually make you short of breath and see if it makes any difference and if makes none then don't take albuterol after activity unless you can't catch your breath as this means it's the resting that helps, not the albuterol.     Your breathing should gradually improve if you continue to lose weight as it is the main problem with your breathing   My office will be contacting you by phone for referral to PFTs  - if you don't hear back from my office within one week please call us back or notify us thru MyChart and we'll address it right away.    Please remember to go to the lab department   for your tests - we will call you with the results when they are available.

## 2023-07-31 NOTE — Assessment & Plan Note (Signed)
 Body mass index is 52.54 kg/m.  -  trending down now per pt  Lab Results  Component Value Date   TSH 1.600 06/18/2023      Contributing to doe and risk of GERD/dvt/ PE  >>>   reviewed the need and the process to achieve and maintain neg calorie balance > defer f/u primary care including intermittently monitoring thyroid status     Each maintenance medication was reviewed in detail including emphasizing most importantly the difference between maintenance and prns and under what circumstances the prns are to be triggered using an action plan format where appropriate.  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

## 2023-07-31 NOTE — Assessment & Plan Note (Signed)
 Onset around 2017 -  nl pfts  12/10/15 @ wt 220  - try off acei and on gerd max rx 09/29/2020  - 11/10/2020 at wt 238    Walked RA  approx   200  ft  @ moderate pace  stopped due to  Sob with sats  99% at end - 07/31/2023  @ 269 lb  Walked on RA  x  2  lap(s) =  approx 300  ft  @ mod pace, stopped due to sob with lowest 02 sats 88% but climbed back to 90% before end of study   - The proper method of use, as well as anticipated side effects, of a metered-dose inhaler were discussed and demonstrated to the patient using teach back method   She is convinced she wheezes and needs saba with ex but that was not the case today and she has not tried to use the saba preex      Pfts ordered with f/u if showing anything but the effects of obesity or if she notes improved ex tol by using saba pre exertion.

## 2023-08-01 ENCOUNTER — Ambulatory Visit (INDEPENDENT_AMBULATORY_CARE_PROVIDER_SITE_OTHER)

## 2023-08-01 ENCOUNTER — Telehealth: Payer: Self-pay | Admitting: Internal Medicine

## 2023-08-01 DIAGNOSIS — N3281 Overactive bladder: Secondary | ICD-10-CM | POA: Diagnosis not present

## 2023-08-01 NOTE — Telephone Encounter (Signed)
 Spoke with patient regarding the Thursday 08/30/23 3:00pm PFT appointment at [facility] time is 2:45 pm--1st floor registration desk---will mail appointment information and instructions to patient and she voiced her understanding

## 2023-08-01 NOTE — Patient Instructions (Signed)

## 2023-08-01 NOTE — Progress Notes (Signed)
 PTNS  Session # 1 of 12  PTNS was prescribed for the patient's Overactive Bladder symptoms. The voiding diary and/or patients symptoms were reviewed prior to the start of treatment.   Patient Goals: Sleep through the night and no leaks during intercourse   Health & Social Factors: No change Caffeine: coffee 1 /day Alcohol: no Daytime voids #per day: 5 Night-time voids #per night: 0 Urgency: Severe Incontinence Episodes #per day: 5/ everytime Treatment Plan/Comments: N/A  Ankle used: right Treatment Setting: 4 Feeling/ Response: tingling sensation on the bottom of the foot  PTNS Treatment The needle electrode was inserted into the lower, inner aspect of patient's leg. The surface electrode was placed on the inside arch of the foot on the treatment leg.The lead set was connected to the stimulator, and the needle electrode clip was connected to the needle electrode. The stimulator that produces an adjustable electrical pulse that travels to the sacral nerve plexus via the tibial nerve was increased until a patient response was observed.    Performed By: Alfonse Spruce CMA  Follow Up: a week as scheduled

## 2023-08-07 ENCOUNTER — Ambulatory Visit (INDEPENDENT_AMBULATORY_CARE_PROVIDER_SITE_OTHER)

## 2023-08-07 DIAGNOSIS — N3281 Overactive bladder: Secondary | ICD-10-CM

## 2023-08-07 NOTE — Patient Instructions (Signed)

## 2023-08-07 NOTE — Progress Notes (Signed)
 PTNS  Session # 2  of 12  PTNS was prescribed for the patient's Overactive Bladder symptoms. The voiding diary and/or patients symptoms were reviewed prior to the start of treatment.   Patient Goals: reduce frequency, sleep through the night  Health & Social Factors:  Caffeine:  1 Alcohol: no Daytime voids #per day: 6 Night-time voids #per night: 0 Urgency: severe Incontinence Episodes #per day: 6 Treatment Plan/Comments: n/a  Ankle used: left Treatment Setting: 1 Feeling/ Response: positive sensation  PTNS Treatment The needle electrode was inserted into the lower, inner aspect of patient's leg. The surface electrode was placed on the inside arch of the foot on the treatment leg.The lead set was connected to the stimulator, and the needle electrode clip was connected to the needle electrode. The stimulator that produces an adjustable electrical pulse that travels to the sacral nerve plexus via the tibial nerve was increased until a patient response was observed.    Performed By: [facility] LPN  Follow Up: Keep scheduled NV

## 2023-08-08 LAB — IGE: IgE (Immunoglobulin E), Serum: 351 [IU]/mL (ref 6–495)

## 2023-08-08 LAB — BRAIN NATRIURETIC PEPTIDE: BNP: 56.8 pg/mL (ref 0.0–100.0)

## 2023-08-08 LAB — D-DIMER, QUANTITATIVE: D-DIMER: 1.76 mg{FEU}/L — ABNORMAL HIGH (ref 0.00–0.49)

## 2023-08-09 ENCOUNTER — Other Ambulatory Visit: Payer: Self-pay

## 2023-08-09 DIAGNOSIS — R0609 Other forms of dyspnea: Secondary | ICD-10-CM

## 2023-08-09 DIAGNOSIS — R7989 Other specified abnormal findings of blood chemistry: Secondary | ICD-10-CM

## 2023-08-09 NOTE — Addendum Note (Signed)
 Addended by: Dorna Mai R on: 08/09/2023 11:00 AM   Modules accepted: Orders

## 2023-08-10 ENCOUNTER — Ambulatory Visit (HOSPITAL_COMMUNITY)
Admission: RE | Admit: 2023-08-10 | Discharge: 2023-08-10 | Disposition: A | Discharge status: Home / Self Care | Source: Ambulatory Visit | Attending: Internal Medicine | Admitting: Internal Medicine

## 2023-08-10 ENCOUNTER — Ambulatory Visit (HOSPITAL_BASED_OUTPATIENT_CLINIC_OR_DEPARTMENT_OTHER)
Admission: RE | Admit: 2023-08-10 | Discharge: 2023-08-10 | Disposition: A | Discharge status: Home / Self Care | Source: Ambulatory Visit | Attending: Internal Medicine | Admitting: Internal Medicine

## 2023-08-10 DIAGNOSIS — R0609 Other forms of dyspnea: Secondary | ICD-10-CM | POA: Diagnosis present

## 2023-08-10 DIAGNOSIS — R7989 Other specified abnormal findings of blood chemistry: Secondary | ICD-10-CM | POA: Diagnosis present

## 2023-08-10 MED ORDER — TECHNETIUM TO 99M ALBUMIN AGGREGATED
4.4000 | Freq: Once | INTRAVENOUS | Status: AC | PRN
  Administered 2023-08-10: 4.4 via INTRAVENOUS

## 2023-08-13 ENCOUNTER — Ambulatory Visit

## 2023-08-14 ENCOUNTER — Other Ambulatory Visit: Payer: Self-pay | Admitting: Family Medicine

## 2023-08-14 DIAGNOSIS — I10 Essential (primary) hypertension: Secondary | ICD-10-CM

## 2023-08-16 ENCOUNTER — Encounter: Payer: Self-pay | Admitting: *Deleted

## 2023-08-17 ENCOUNTER — Ambulatory Visit

## 2023-08-17 DIAGNOSIS — N3281 Overactive bladder: Secondary | ICD-10-CM

## 2023-08-17 NOTE — Patient Instructions (Signed)

## 2023-08-17 NOTE — Progress Notes (Signed)
 PTNS  Session # 3 of 12  PTNS was prescribed for the patient's Overactive Bladder symptoms. The voiding diary and/or patients symptoms were reviewed prior to the start of treatment.   Patient Goals: void every 4 hours  Health & Social Factors:  Caffeine:  1 caffeine beverage daily Alcohol : no Daytime voids #per day: 6 Night-time voids #per night: 0 Urgency: yes strong Incontinence Episodes #per day: 6 Treatment Plan/Comments: continue monthly treatments  Ankle used: right Treatment Setting: 2 Feeling/ Response: positive sensation  PTNS Treatment The needle electrode was inserted into the lower, inner aspect of patient's leg. The surface electrode was placed on the inside arch of the foot on the treatment leg.The lead set was connected to the stimulator, and the needle electrode clip was connected to the needle electrode. The stimulator that produces an adjustable electrical pulse that travels to the sacral nerve plexus via the tibial nerve was increased until a patient response was observed.    Performed By: Gillian Lacrosse, CMA  Follow Up: keep weekly treatment appointment

## 2023-08-20 ENCOUNTER — Ambulatory Visit

## 2023-08-27 ENCOUNTER — Ambulatory Visit

## 2023-08-29 ENCOUNTER — Ambulatory Visit (INDEPENDENT_AMBULATORY_CARE_PROVIDER_SITE_OTHER)

## 2023-08-29 DIAGNOSIS — N3281 Overactive bladder: Secondary | ICD-10-CM

## 2023-08-29 NOTE — Patient Instructions (Signed)

## 2023-08-29 NOTE — Progress Notes (Signed)
 PTNS  Session # 4 of 12  PTNS was prescribed for the patient's Overactive Bladder symptoms. The voiding diary and/or patients symptoms were reviewed prior to the start of treatment.   Patient Goals: Reduced urgency and sleep through the night  Health & Social Factors: No Change Caffeine: coffee 1 /day Alcohol : no Daytime voids #per day: 5 Night-time voids #per night: 0 Urgency: Severe Incontinence Episodes #per day: 5 Treatment Plan/Comments: Toilet every 3 hours  Ankle used: left Treatment Setting: 4 Feeling/ Response: Bottom of foot tingle sensation  PTNS Treatment The needle electrode was inserted into the lower, inner aspect of patient's leg. The surface electrode was placed on the inside arch of the foot on the treatment leg.The lead set was connected to the stimulator, and the needle electrode clip was connected to the needle electrode. The stimulator that produces an adjustable electrical pulse that travels to the sacral nerve plexus via the tibial nerve was increased until a patient response was observed.    Performed By: Melvenia Stabs CMA  Follow Up: As scheduled

## 2023-08-30 ENCOUNTER — Ambulatory Visit (HOSPITAL_COMMUNITY)
Admission: RE | Admit: 2023-08-30 | Discharge: 2023-08-30 | Disposition: A | Discharge status: Home / Self Care | Source: Ambulatory Visit | Attending: Internal Medicine | Admitting: Internal Medicine

## 2023-08-30 DIAGNOSIS — R0609 Other forms of dyspnea: Secondary | ICD-10-CM | POA: Insufficient documentation

## 2023-08-30 LAB — PULMONARY FUNCTION TEST
DL/VA % pred: 103 %
DL/VA: 4.42 ml/min/mmHg/L
DLCO unc % pred: 72 %
DLCO unc: 11.78 ml/min/mmHg
FEF 25-75 Post: 1.56 L/s
FEF 25-75 Pre: 1.3 L/s
FEF2575-%Change-Post: 19 %
FEF2575-%Pred-Post: 112 %
FEF2575-%Pred-Pre: 93 %
FEV1-%Change-Post: 3 %
FEV1-%Pred-Post: 80 %
FEV1-%Pred-Pre: 77 %
FEV1-Post: 1.37 L
FEV1-Pre: 1.33 L
FEV1FVC-%Change-Post: 5 %
FEV1FVC-%Pred-Pre: 107 %
FEV6-%Change-Post: -2 %
FEV6-%Pred-Post: 74 %
FEV6-%Pred-Pre: 76 %
FEV6-Post: 1.63 L
FEV6-Pre: 1.66 L
FEV6FVC-%Pred-Post: 105 %
FEV6FVC-%Pred-Pre: 105 %
FVC-%Change-Post: -2 %
FVC-%Pred-Post: 70 %
FVC-%Pred-Pre: 72 %
FVC-Post: 1.63 L
FVC-Pre: 1.66 L
Post FEV1/FVC ratio: 84 %
Post FEV6/FVC ratio: 100 %
Pre FEV1/FVC ratio: 80 %
Pre FEV6/FVC Ratio: 100 %
RV % pred: 74 %
RV: 1.56 L
TLC % pred: 74 %
TLC: 3.3 L

## 2023-08-30 MED ORDER — ALBUTEROL SULFATE (2.5 MG/3ML) 0.083% IN NEBU
2.5000 mg | INHALATION_SOLUTION | Freq: Once | RESPIRATORY_TRACT | Status: AC
  Administered 2023-08-30: 2.5 mg via RESPIRATORY_TRACT

## 2023-09-05 ENCOUNTER — Ambulatory Visit (INDEPENDENT_AMBULATORY_CARE_PROVIDER_SITE_OTHER)

## 2023-09-05 DIAGNOSIS — N3281 Overactive bladder: Secondary | ICD-10-CM | POA: Diagnosis not present

## 2023-09-05 NOTE — Patient Instructions (Signed)

## 2023-09-05 NOTE — Progress Notes (Signed)
 PTNS  Session # 5 of 12  PTNS was prescribed for the patient's Overactive Bladder symptoms. The voiding diary and/or patients symptoms were reviewed prior to the start of treatment.   Patient Goals: Sleep through the night and void every . hours  Health & Social Factors: No Changes  Caffeine: coffee 1 /day Alcohol : no Daytime voids #per day: 4-5 Night-time voids #per night: 0 Urgency: Severe Incontinence Episodes #per day: 3 Treatment Plan/Comments: Toilet every 3 hours  Ankle used: right Treatment Setting: 2 Feeling/ Response: heel tingle sensation  PTNS Treatment The needle electrode was inserted into the lower, inner aspect of patient's leg. The surface electrode was placed on the inside arch of the foot on the treatment leg.The lead set was connected to the stimulator, and the needle electrode clip was connected to the needle electrode. The stimulator that produces an adjustable electrical pulse that travels to the sacral nerve plexus via the tibial nerve was increased until a patient response was observed.    Performed By: Melvenia Stabs CMA  Follow Up: As scheduled

## 2023-09-12 ENCOUNTER — Ambulatory Visit (INDEPENDENT_AMBULATORY_CARE_PROVIDER_SITE_OTHER)

## 2023-09-12 DIAGNOSIS — N3281 Overactive bladder: Secondary | ICD-10-CM | POA: Diagnosis not present

## 2023-09-12 NOTE — Patient Instructions (Signed)

## 2023-09-12 NOTE — Progress Notes (Signed)
 PTNS  Session # 6 of 12  PTNS was prescribed for the patient's Overactive Bladder symptoms. The voiding diary and/or patients symptoms were reviewed prior to the start of treatment.   Patient Goals: sleep through the night  Health & Social Factors: None Caffeine: 1 caffeine Alcohol : no Daytime voids #per day: 7 Night-time voids #per night: 0 Urgency: yes strong Incontinence Episodes #per day: 5 Treatment Plan/Comments: Toilet every 2 hours  Ankle used: left Treatment Setting: 4 Feeling/ Response: positive sensation  PTNS Treatment The needle electrode was inserted into the lower, inner aspect of patient's leg. The surface electrode was placed on the inside arch of the foot on the treatment leg.The lead set was connected to the stimulator, and the needle electrode clip was connected to the needle electrode. The stimulator that produces an adjustable electrical pulse that travels to the sacral nerve plexus via the tibial nerve was increased until a patient response was observed.    Performed By: Zenon Hilda, LPN/ Freya.Fus, CMA  Follow Up: 1 week PTNS with nurse

## 2023-09-19 ENCOUNTER — Ambulatory Visit

## 2023-09-21 ENCOUNTER — Other Ambulatory Visit (HOSPITAL_COMMUNITY): Payer: Self-pay | Admitting: Family Medicine

## 2023-09-21 DIAGNOSIS — Z1231 Encounter for screening mammogram for malignant neoplasm of breast: Secondary | ICD-10-CM

## 2023-09-28 ENCOUNTER — Ambulatory Visit (INDEPENDENT_AMBULATORY_CARE_PROVIDER_SITE_OTHER)

## 2023-09-28 DIAGNOSIS — N3281 Overactive bladder: Secondary | ICD-10-CM | POA: Diagnosis not present

## 2023-09-28 NOTE — Progress Notes (Signed)
 PTNS  Session # 7 of 12  PTNS was prescribed for the patient's Overactive Bladder symptoms. The voiding diary and/or patients symptoms were reviewed prior to the start of treatment.   Patient Goals: sleep through the night   Health & Social Factors: No Change Caffeine: coffee 1 /day Alcohol : no Daytime voids #per day: 7 Night-time voids #per night: 0 Urgency: severe Incontinence Episodes #per day: 7 Treatment Plan/Comments: toilet every 2-3 houurs  Ankle used: right Treatment Setting: 7 Feeling/ Response: foot tingle sensation  PTNS Treatment The needle electrode was inserted into the lower, inner aspect of patient's leg. The surface electrode was placed on the inside arch of the foot on the treatment leg.The lead set was connected to the stimulator, and the needle electrode clip was connected to the needle electrode. The stimulator that produces an adjustable electrical pulse that travels to the sacral nerve plexus via the tibial nerve was increased until a patient response was observed.    Performed By: Melvenia Stabs CMA  Follow Up: 1 week as scheduled

## 2023-10-03 ENCOUNTER — Ambulatory Visit (INDEPENDENT_AMBULATORY_CARE_PROVIDER_SITE_OTHER)

## 2023-10-03 DIAGNOSIS — N3281 Overactive bladder: Secondary | ICD-10-CM

## 2023-10-03 NOTE — Progress Notes (Signed)
 PTNS  Session # 8 of 12  PTNS was prescribed for the patient's Overactive Bladder symptoms. The voiding diary and/or patients symptoms were reviewed prior to the start of treatment.   Patient Goals: Sleep through the night  Health & Social Factors: no Caffeine: 1 a day Alcohol : no Daytime voids #per day: 5 Night-time voids #per night: 0 Urgency: yes strong Incontinence Episodes #per day: 5 Treatment Plan/Comments: toilet every 3 hours  Ankle used: left Treatment Setting: 4 Feeling/ Response: positive sensation  PTNS Treatment The needle electrode was inserted into the lower, inner aspect of patient's leg. The surface electrode was placed on the inside arch of the foot on the treatment leg.The lead set was connected to the stimulator, and the needle electrode clip was connected to the needle electrode. The stimulator that produces an adjustable electrical pulse that travels to the sacral nerve plexus via the tibial nerve was increased until a patient response was observed.    Performed By: Gorden Latino, CMA  Follow Up: weekly PTNS

## 2023-10-03 NOTE — Patient Instructions (Signed)

## 2023-10-11 ENCOUNTER — Encounter: Payer: Self-pay | Admitting: Family Medicine

## 2023-10-11 ENCOUNTER — Ambulatory Visit (INDEPENDENT_AMBULATORY_CARE_PROVIDER_SITE_OTHER): Payer: Medicare Other | Admitting: Family Medicine

## 2023-10-11 VITALS — BP 148/75 | HR 80 | Resp 16 | Ht 60.0 in | Wt 269.4 lb

## 2023-10-11 DIAGNOSIS — I10 Essential (primary) hypertension: Secondary | ICD-10-CM

## 2023-10-11 DIAGNOSIS — E559 Vitamin D deficiency, unspecified: Secondary | ICD-10-CM | POA: Diagnosis not present

## 2023-10-11 DIAGNOSIS — E038 Other specified hypothyroidism: Secondary | ICD-10-CM

## 2023-10-11 DIAGNOSIS — R7301 Impaired fasting glucose: Secondary | ICD-10-CM | POA: Diagnosis not present

## 2023-10-11 DIAGNOSIS — E7849 Other hyperlipidemia: Secondary | ICD-10-CM

## 2023-10-11 NOTE — Assessment & Plan Note (Signed)
 She takes simvastatin  20 mg daily The patient was encouraged to make lifestyle changes, including avoiding simple carbohydrates such as cakes, sweet desserts, ice cream, soda (diet or regular), sweet tea, candies, chips, cookies, store-bought juices, excessive alcohol  (more than 1-2 drinks per day), lemonade, artificial sweeteners, donuts, coffee creamers, and sugar-free products. Additionally, reducing the consumption of greasy, fatty foods and increasing physical activity were recommended. The patient verbalized understanding and is aware of the plan of care.     Lab Results  Component Value Date   CHOL 132 06/18/2023   HDL 44 06/18/2023   LDLCALC 56 06/18/2023   TRIG 193 (H) 06/18/2023   CHOLHDL 3.0 06/18/2023

## 2023-10-11 NOTE — Progress Notes (Signed)
 Established Patient Office Visit  Subjective:  Patient ID: Jessica  Jessica Strong, female    DOB: [birth year]  Age: 78 y.o. MRN: [id number]  CC:  Chief Complaint  Patient presents with   Hypertension    Follow up visit     HPI Jessica  Jessica Strong is a 78 y.o. female with past medical history of type 2 diabetes, hyperlipidemia,hypothyroidism presents for f/u of  chronic medical conditions. For the details of today's visit, please refer to the assessment and plan.     Past Medical History:  Diagnosis Date   Allergy    Anemia    Anxiety    Arthritis    Asthma    Cataract    cervical ca 1990   Depression    Diabetes (HCC)    Dyspnea    Hypercholesterolemia    Hypertension    Hypothyroidism    Myocardial infarction (HCC) 2005   Sleep apnea    on CPAP 14 mm PS since February 2017   Stroke [facility]) 2007   weakness on R side   Vitamin D  deficiency     Past Surgical History:  Procedure Laterality Date   ABDOMINAL HYSTERECTOMY     BARIATRIC SURGERY     BIOPSY  09/17/2020   Procedure: BIOPSY;  Surgeon: Urban Garden, MD;  Location: [facility];  Service: Gastroenterology;;   BIOPSY  12/08/2022   Procedure: BIOPSY;  Surgeon: Urban Garden, MD;  Location: [facility];  Service: Gastroenterology;;   BREAST BIOPSY Right    beningn   CARDIAC CATHETERIZATION N/A 12/24/2015   Procedure: Right/Left Heart Cath and Coronary Angiography;  Surgeon: Lucendia Rusk, MD;  Location: [facility]VAS[facility] CV LAB;  Service: Cardiovascular;  Laterality: N/A;   COLONOSCOPY  2018   COLONOSCOPY WITH PROPOFOL  N/A 09/17/2020   Procedure: COLONOSCOPY WITH PROPOFOL ;  Surgeon: Urban Garden, MD;  Location: [facility];  Service: Gastroenterology;  Laterality: N/A;  9:00   ESOPHAGOGASTRODUODENOSCOPY (EGD) WITH PROPOFOL  N/A 09/17/2020   Procedure: ESOPHAGOGASTRODUODENOSCOPY (EGD) WITH PROPOFOL ;  Surgeon: Urban Garden, MD;  Location: [facility];  Service:  Gastroenterology;  Laterality: N/A;   ESOPHAGOGASTRODUODENOSCOPY (EGD) WITH PROPOFOL  N/A 12/08/2022   Procedure: ESOPHAGOGASTRODUODENOSCOPY (EGD) WITH PROPOFOL ;  Surgeon: Urban Garden, MD;  Location: [facility];  Service: Gastroenterology;  Laterality: N/A;  2:30PM;ASA 3   HERNIA REPAIR     LEFT HEART CATH AND CORONARY ANGIOGRAPHY N/A 07/12/2017   Procedure: LEFT HEART CATH AND CORONARY ANGIOGRAPHY;  Surgeon: Arty Binning, MD;  Location: [facility]VAS[facility] CV LAB;  Service: Cardiovascular;  Laterality: N/A;    Family History  Problem Relation Age of Onset   Cancer Mother    Colon cancer Mother    Diabetes Mother    Hypertension Father    Thyroid  disease Father    Thyroid  cancer Father    Diabetes Father    Esophageal cancer Father    Breast cancer Maternal Aunt    Colon cancer Maternal Aunt    Breast cancer Maternal Aunt    Colon cancer Maternal Grandmother    Stroke Maternal Grandfather    Stomach cancer Paternal Grandfather    Colon cancer Son    Colon polyps Neg Hx    Rectal cancer Neg Hx     Social History   Socioeconomic History   Marital status: Married    Spouse name: Not on file   Number of children: 2   Years of education: Not on file  Highest education level: Not on file  Occupational History   Occupation: retired  Tobacco Use   Smoking status: Never    Passive exposure: Never   Smokeless tobacco: Never  Vaping Use   Vaping status: Never Used  Substance and Sexual Activity   Alcohol  use: Not Currently    Comment: occasionally   Drug use: No   Sexual activity: Never    Partners: Male  Other Topics Concern   Not on file  Social History Narrative   Not on file   Social Drivers of Health   Financial Resource Strain: Low Risk  (11/30/2022)   Overall Financial Resource Strain (CARDIA)    Difficulty of Paying Living Expenses: Not hard at all  Food Insecurity: No Food Insecurity (11/30/2022)   Hunger Vital Sign    Worried About Running Out of  Food in the Last Year: Never true    Ran Out of Food in the Last Year: Never true  Transportation Needs: No Transportation Needs (11/30/2022)   PRAPARE - Administrator, Civil Service (Medical): No    Lack of Transportation (Non-Medical): No  Physical Activity: Inactive (11/30/2022)   Exercise Vital Sign    Days of Exercise per Week: 0 days    Minutes of Exercise per Session: 0 min  Stress: No Stress Concern Present (11/30/2022)   [employer] of Occupational Health - Occupational Stress Questionnaire    Feeling of Stress : Not at all  Social Connections: Moderately Isolated (11/30/2022)   Social Connection and Isolation Panel    Frequency of Communication with Friends and Family: More than three times a week    Frequency of Social Gatherings with Friends and Family: More than three times a week    Attends Religious Services: Never    Database administrator or Organizations: No    Attends Banker Meetings: Never    Marital Status: Married  Catering manager Violence: Not At Risk (11/30/2022)   Humiliation, Afraid, Rape, and Kick questionnaire    Fear of Current or Ex-Partner: No    Emotionally Abused: No    Physically Abused: No    Sexually Abused: No    Outpatient Medications Prior to Visit  Medication Sig Dispense Refill   ACCU-CHEK AVIVA PLUS test strip Inject 1 strip as directed daily.  4   acetaminophen  (TYLENOL ) 500 MG tablet Take 500 mg by mouth every 6 (six) hours as needed for mild pain or headache. As needed.     albuterol  (VENTOLIN  HFA) 108 (90 Base) MCG/ACT inhaler Inhale 2 puffs into the lungs every 6 (six) hours as needed for wheezing or shortness of breath. 8 g 2   aspirin  325 MG tablet Take 325 mg by mouth daily.     bisacodyl (DULCOLAX) 5 MG EC tablet Take 5 mg by mouth. One every other day     calcitRIOL (ROCALTROL) 0.25 MCG capsule Take 0.25 mcg by mouth daily.     Calcium  Carb-Cholecalciferol (CALCIUM  600-D PO) Take 1 tablet by mouth daily.      Continuous Glucose Sensor (DEXCOM G7 SENSOR) MISC by Does not apply route.     EPINEPHrine 0.3 mg/0.3 mL IJ SOAJ injection Inject 0.3 mg into the muscle as needed for anaphylaxis.     ferrous sulfate 325 (65 FE) MG tablet Take 325 mg by mouth daily with breakfast.     furosemide  (LASIX ) 20 MG tablet TAKE 1 TABLET BY MOUTH DAILY.  (MAY TAKE EXTRA 1 TABLET AS  NEEDED  FOR SEVERE SWELLING) 200 tablet 2   Lancets Thin MISC Inject 1 Stick into the skin daily.     levothyroxine  (SYNTHROID ) 88 MCG tablet Take 1 tablet (88 mcg total) by mouth daily before breakfast. 90 tablet 3   Multiple Vitamins-Minerals (MULTIVITAMIN ADULT) CHEW Chew 1 tablet by mouth daily at 12 noon.     NOVOFINE PEN NEEDLE 32G X 6 MM MISC Use to inject insulin  4 times daily 200 each 2   olmesartan  (BENICAR ) 20 MG tablet TAKE 1 TABLET BY MOUTH DAILY 100 tablet 2   omeprazole  (PRILOSEC) 40 MG capsule Take 1 capsule (40 mg total) by mouth daily. 90 capsule 3   potassium chloride  (KLOR-CON  M) 10 MEQ tablet TAKE 1 TABLET BY MOUTH DAILY 100 tablet 2   simvastatin  (ZOCOR ) 20 MG tablet Take 1 tablet (20 mg total) by mouth at bedtime. 90 tablet 3   terbinafine  (LAMISIL  AT) 1 % cream Apply 1 Application topically 2 (two) times daily. 42 g 1   tirzepatide  (MOUNJARO ) 15 MG/0.5ML Pen Inject 15 mg into the skin once a week. 6 mL 3   TRESIBA  FLEXTOUCH 200 UNIT/ML FlexTouch Pen Inject 66 Units into the skin daily at 10 pm. 45 mL 3   Vibegron  (GEMTESA ) 75 MG TABS Take 1 tablet (75 mg total) by mouth daily. 30 tablet 11   No facility-administered medications prior to visit.    Allergies  Allergen Reactions   Corn Oil Rash   Other Rash    PT STATES SHE IS ALLERGIC TO MOST FOODS-STATES IT WAS FOUND IN ALLERGY TESTING   Peanut Allergen Powder-Dnfp Rash   Peanut-Containing Drug Products Rash   Penicillins Rash    Has patient had a PCN reaction causing immediate rash, facial/tongue/throat swelling, SOB or lightheadedness with hypotension:  Yes Has patient had a PCN reaction causing severe rash involving mucus membranes or skin necrosis: No Has patient had a PCN reaction that required hospitalization No Has patient had a PCN reaction occurring within the last 10 years: No. Has not had in 40-50 years If all of the above answers are NO, then may proceed with Cephalosporin use.    ROS Review of Systems  Constitutional:  Negative for chills and fever.  Eyes:  Negative for visual disturbance.  Respiratory:  Negative for chest tightness and shortness of breath.   Neurological:  Negative for dizziness and headaches.      Objective:    Physical Exam HENT:     Head: Normocephalic.     Mouth/Throat:     Mouth: Mucous membranes are moist.   Cardiovascular:     Rate and Rhythm: Normal rate.     Heart sounds: Normal heart sounds.  Pulmonary:     Effort: Pulmonary effort is normal.     Breath sounds: Normal breath sounds.   Neurological:     Mental Status: She is alert.     BP (!) 148/75   Pulse 80   Resp 16   Ht 5' (1.524 m)   Wt 269 lb 6.4 oz (122.2 kg)   SpO2 95%   BMI 52.61 kg/m  Wt Readings from Last 3 Encounters:  10/11/23 269 lb 6.4 oz (122.2 kg)  07/31/23 269 lb (122 kg)  07/10/23 270 lb (122.5 kg)    Lab Results  Component Value Date   TSH 1.600 06/18/2023   Lab Results  Component Value Date   WBC 8.8 06/18/2023   HGB 10.9 (L) 06/18/2023   HCT 35.3 06/18/2023  MCV 83 06/18/2023   PLT 299 06/18/2023   Lab Results  Component Value Date   NA 140 07/10/2023   K 5.3 (H) 07/10/2023   CO2 20 07/10/2023   GLUCOSE 202 (H) 07/10/2023   BUN 29 (H) 07/10/2023   CREATININE 1.86 (H) 07/10/2023   BILITOT <0.2 07/10/2023   ALKPHOS 158 (H) 07/10/2023   AST 17 07/10/2023   ALT 11 07/10/2023   PROT 6.6 07/10/2023   ALBUMIN  4.2 07/10/2023   CALCIUM  9.4 07/10/2023   ANIONGAP 10 09/15/2020   EGFR 28 (L) 07/10/2023   Lab Results  Component Value Date   CHOL 132 06/18/2023   Lab Results   Component Value Date   HDL 44 06/18/2023   Lab Results  Component Value Date   LDLCALC 56 06/18/2023   Lab Results  Component Value Date   TRIG 193 (H) 06/18/2023   Lab Results  Component Value Date   CHOLHDL 3.0 06/18/2023   Lab Results  Component Value Date   HGBA1C 9.1 (H) 06/18/2023      Assessment & Plan:  Essential hypertension Assessment & Plan: Uncontrolled BP The patient reports not taking her medications today. She takes Olmesartan  20 mg daily and Lasix  20 mg as needed for peripheral edema.Medication compliance was encouraged, and she was advised to check her ambulatory blood pressure readings daily and schedule an appointment if her BP exceeds 140/90 mmHg.A low-sodium diet of less than 2,300 mg daily is recommended, along with increased physical activity of moderate intensity, aiming for 150 minutes weekly. The patient is encouraged to continue these lifestyle modifications to help manage her blood pressure effectively.  BP Readings from Last 3 Encounters:  10/11/23 (!) 148/75  07/31/23 (!) 145/82  07/10/23 130/80       Other hyperlipidemia Assessment & Plan: She takes simvastatin  20 mg daily The patient was encouraged to make lifestyle changes, including avoiding simple carbohydrates such as cakes, sweet desserts, ice cream, soda (diet or regular), sweet tea, candies, chips, cookies, store-bought juices, excessive alcohol  (more than 1-2 drinks per day), lemonade, artificial sweeteners, donuts, coffee creamers, and sugar-free products. Additionally, reducing the consumption of greasy, fatty foods and increasing physical activity were recommended. The patient verbalized understanding and is aware of the plan of care.     Lab Results  Component Value Date   CHOL 132 06/18/2023   HDL 44 06/18/2023   LDLCALC 56 06/18/2023   TRIG 193 (H) 06/18/2023   CHOLHDL 3.0 06/18/2023    Orders: -     Lipid panel -     CMP14+EGFR -     CBC with  Differential/Platelet  IFG (impaired fasting glucose) -     Hemoglobin A1c  Vitamin D  deficiency -     VITAMIN D  25 Hydroxy (Vit-D Deficiency, Fractures)  TSH (thyroid -stimulating hormone deficiency) -     TSH + free T4  Note: This chart has been completed using Engineer, civil (consulting) software, and while attempts have been made to ensure accuracy, certain words and phrases may not be transcribed as intended.    Follow-up: Return in about 4 months (around 02/10/2024).   Vidyuth Belsito, FNP

## 2023-10-11 NOTE — Patient Instructions (Addendum)
 I appreciate the opportunity to provide care to you today!    Follow up:  4 months  Labs: please stop by the lab during the week to get your blood drawn (CBC, CMP, TSH, Lipid profile, HgA1c, Vit D)  Schedule medicare annual visit  For a Healthier YOU, I Recommend: Reducing your intake of sugar, sodium, carbohydrates, and saturated fats. Increasing your fiber intake by incorporating more whole grains, fruits, and vegetables into your meals. Setting healthy goals with a focus on lowering your consumption of carbs, sugar, and unhealthy fats. Adding variety to your diet by including a wide range of fruits and vegetables. Cutting back on soda and limiting processed foods as much as possible. Staying active: In addition to taking your weight loss medication, aim for at least 150 minutes of moderate-intensity physical activity each week for optimal results.    Please continue to a heart-healthy diet and increase your physical activities. Try to exercise for at least five days a week.    It was a pleasure to see you and I look forward to continuing to work together on your health and well-being. Please do not hesitate to call the office if you need care or have questions about your care.  In case of emergency, please visit the Emergency Department for urgent care, or contact our clinic at [phone] to schedule an appointment. We're here to help you!   Have a wonderful day and week. With Gratitude, Brodin Gelpi MSN, FNP-BC

## 2023-10-11 NOTE — Assessment & Plan Note (Signed)
 Uncontrolled BP The patient reports not taking her medications today. She takes Olmesartan  20 mg daily and Lasix  20 mg as needed for peripheral edema.Medication compliance was encouraged, and she was advised to check her ambulatory blood pressure readings daily and schedule an appointment if her BP exceeds 140/90 mmHg.A low-sodium diet of less than 2,300 mg daily is recommended, along with increased physical activity of moderate intensity, aiming for 150 minutes weekly. The patient is encouraged to continue these lifestyle modifications to help manage her blood pressure effectively.  BP Readings from Last 3 Encounters:  10/11/23 (!) 148/75  07/31/23 (!) 145/82  07/10/23 130/80

## 2023-10-15 ENCOUNTER — Ambulatory Visit (INDEPENDENT_AMBULATORY_CARE_PROVIDER_SITE_OTHER)

## 2023-10-15 DIAGNOSIS — N3281 Overactive bladder: Secondary | ICD-10-CM

## 2023-10-15 NOTE — Progress Notes (Unsigned)
 PTNS  Session # 9 of 12  PTNS was prescribed for the patient's Overactive Bladder symptoms. The voiding diary and/or patients symptoms were reviewed prior to the start of treatment.   Patient Goals: Reduce urgency  Health & Social Factors: No Change Caffeine: 1 Alcohol : no Daytime voids #per day: 5 Night-time voids #per night: 0 Urgency: yes Severe Incontinence Episodes #per day: Every one Treatment Plan/Comments: Toilet every 2 hours  Ankle used: right Treatment Setting: 1 Feeling/ Response: positive sensation  PTNS Treatment The needle electrode was inserted into the lower, inner aspect of patient's leg. The surface electrode was placed on the inside arch of the foot on the treatment leg.The lead set was connected to the stimulator, and the needle electrode clip was connected to the needle electrode. The stimulator that produces an adjustable electrical pulse that travels to the sacral nerve plexus via the tibial nerve was increased until a patient response was observed.    Performed By: Marguarite Shields, CMA  Follow Up: Keep Nurse visited

## 2023-10-15 NOTE — Patient Instructions (Signed)

## 2023-10-16 ENCOUNTER — Encounter (INDEPENDENT_AMBULATORY_CARE_PROVIDER_SITE_OTHER): Payer: Self-pay | Admitting: Gastroenterology

## 2023-10-16 ENCOUNTER — Ambulatory Visit (INDEPENDENT_AMBULATORY_CARE_PROVIDER_SITE_OTHER): Payer: Medicare Other | Admitting: Gastroenterology

## 2023-10-16 VITALS — BP 137/78 | HR 78 | Temp 97.5°F | Ht 60.0 in | Wt 268.4 lb

## 2023-10-16 DIAGNOSIS — K5900 Constipation, unspecified: Secondary | ICD-10-CM

## 2023-10-16 DIAGNOSIS — K2970 Gastritis, unspecified, without bleeding: Secondary | ICD-10-CM

## 2023-10-16 LAB — CBC WITH DIFFERENTIAL/PLATELET
Basophils Absolute: 0.1 10*3/uL (ref 0.0–0.2)
Basos: 1 %
EOS (ABSOLUTE): 0.3 10*3/uL (ref 0.0–0.4)
Eos: 3 %
Hematocrit: 36 % (ref 34.0–46.6)
Hemoglobin: 10.7 g/dL — ABNORMAL LOW (ref 11.1–15.9)
Immature Grans (Abs): 0 10*3/uL (ref 0.0–0.1)
Immature Granulocytes: 0 %
Lymphocytes Absolute: 2.3 10*3/uL (ref 0.7–3.1)
Lymphs: 26 %
MCH: 25.2 pg — ABNORMAL LOW (ref 26.6–33.0)
MCHC: 29.7 g/dL — ABNORMAL LOW (ref 31.5–35.7)
MCV: 85 fL (ref 79–97)
Monocytes Absolute: 0.8 10*3/uL (ref 0.1–0.9)
Monocytes: 9 %
Neutrophils Absolute: 5.4 10*3/uL (ref 1.4–7.0)
Neutrophils: 61 %
Platelets: 282 10*3/uL (ref 150–450)
RBC: 4.24 x10E6/uL (ref 3.77–5.28)
RDW: 14.3 % (ref 11.7–15.4)
WBC: 8.9 10*3/uL (ref 3.4–10.8)

## 2023-10-16 LAB — CMP14+EGFR
ALT: 14 IU/L (ref 0–32)
AST: 18 IU/L (ref 0–40)
Albumin: 4.1 g/dL (ref 3.8–4.8)
Alkaline Phosphatase: 159 IU/L — ABNORMAL HIGH (ref 44–121)
BUN/Creatinine Ratio: 15 (ref 12–28)
BUN: 29 mg/dL — ABNORMAL HIGH (ref 8–27)
Bilirubin Total: <0.2 mg/dL (ref 0.0–1.2)
CO2: 17 mmol/L — ABNORMAL LOW (ref 20–29)
Calcium: 9.3 mg/dL (ref 8.7–10.3)
Chloride: 106 mmol/L (ref 96–106)
Creatinine, Ser: 1.89 mg/dL — ABNORMAL HIGH (ref 0.57–1.00)
Globulin, Total: 2.7 g/dL (ref 1.5–4.5)
Glucose: 150 mg/dL — ABNORMAL HIGH (ref 70–99)
Potassium: 5.2 mmol/L (ref 3.5–5.2)
Sodium: 141 mmol/L (ref 134–144)
Total Protein: 6.8 g/dL (ref 6.0–8.5)
eGFR: 27 mL/min/{1.73_m2} — ABNORMAL LOW (ref >59)

## 2023-10-16 LAB — TSH+FREE T4
Free T4: 1.25 ng/dL (ref 0.82–1.77)
TSH: 1.3 u[IU]/mL (ref 0.450–4.500)

## 2023-10-16 LAB — VITAMIN D 25 HYDROXY (VIT D DEFICIENCY, FRACTURES): Vit D, 25-Hydroxy: 44.4 ng/mL (ref 30.0–100.0)

## 2023-10-16 LAB — LIPID PANEL
Chol/HDL Ratio: 3.3 ratio (ref 0.0–4.4)
Cholesterol, Total: 147 mg/dL (ref 100–199)
HDL: 45 mg/dL (ref >39)
LDL Chol Calc (NIH): 67 mg/dL (ref 0–99)
Triglycerides: 214 mg/dL — ABNORMAL HIGH (ref 0–149)
VLDL Cholesterol Cal: 35 mg/dL (ref 5–40)

## 2023-10-16 LAB — HEMOGLOBIN A1C
Est. average glucose Bld gHb Est-mCnc: 189 mg/dL
Hgb A1c MFr Bld: 8.2 % — ABNORMAL HIGH (ref 4.8–5.6)

## 2023-10-16 NOTE — Patient Instructions (Signed)
 You may continue with omeprazole  40mg  daily for now, you can try stopping this in the next month or two to see how you do, if you have recurrence of abdominal pain, I would resume omeprazole .  Increase water intake, aim for atleast 64 oz per day Increase fruits, veggies and whole grains, kiwi and prunes are especially good for constipation  Follow up 1 year

## 2023-10-16 NOTE — Progress Notes (Addendum)
 Referring Provider: Zarwolo, Gloria, FNP Primary Care Physician:  Zarwolo, Gloria, FNP Primary GI Physician: Dr. Sammi Crick   Chief Complaint  Patient presents with   Follow-up    Patient here today for a follow up. Patient denies any current gi related issues.   HPI:   Jessica Strong is a 78 y.o. female with past medical history of  obesity status post Roux-en-Y gastric bypass, anxiety, depression, diabetes, hyperlipidemia, hypertension, myocardial infarction, stroke    Patient presenting today for:  Follow up of abdominal pain/duodenitis and constipation  Last seen December 2024, at that time feeling better, unsure if she was taking omeprazole . Recently started Mounjaro , no further issues with constipation. Having 1-2 BMs per day  Recommended continue with omeprazole  40mg  daily, continue good water intake, high fiber diet   Present:  Doing well today. No GI complaints. No abdominal pain. No issues with constipation or diarrhea. No rectal bleeding or melena. Inquires about how long she should stay on PPI, states she just got a refill.   No red flag symptoms. Patient denies melena, hematochezia, nausea, vomiting, diarrhea, constipation, dysphagia, odyonophagia, early satiety or weight loss.   CT A/P w/o contrast: July 21, 2021 Hepatic steatosis.  Small nonobstructive left renal calculus.  Sigmoid diverticulosis without inflammation. EGD:11/2022 duodenitis.  Last Colonoscopy: 09/17/20- A single non-bleeding colonic angiodysplastic lesion. - One 2 mm polyp in the ascending colon-tubular adenoma - Diverticulosis in the sigmoid colon and in the descending colon. - Non-bleeding internal hemorrhoids.   REPEAT COLONOSCOPY IN 5 YEARS  Past Medical History:  Diagnosis Date   Allergy    Anemia    Anxiety    Arthritis    Asthma    Cataract    cervical ca 1990   Depression    Diabetes (HCC)    Dyspnea    Hypercholesterolemia    Hypertension    Hypothyroidism    Myocardial  infarction (HCC) 2005   Sleep apnea    on CPAP 14 mm PS since February 2017   Stroke [facility]) 2007   weakness on R side   Vitamin D  deficiency     Past Surgical History:  Procedure Laterality Date   ABDOMINAL HYSTERECTOMY     BARIATRIC SURGERY     BIOPSY  09/17/2020   Procedure: BIOPSY;  Surgeon: Urban Garden, MD;  [facility]: [facility];  Service: Gastroenterology;;   BIOPSY  12/08/2022   Procedure: BIOPSY;  Surgeon: Urban Garden, MD;  Location: [facility];  Service: Gastroenterology;;   BREAST BIOPSY Right    beningn   CARDIAC CATHETERIZATION N/A 12/24/2015   Procedure: Right/Left Heart Cath and Coronary Angiography;  Surgeon: Lucendia Rusk, MD;  Location: [facility] INVASIVE CV LAB;  Service: Cardiovascular;  Laterality: N/A;   COLONOSCOPY  2018   COLONOSCOPY WITH PROPOFOL  N/A 09/17/2020   Procedure: COLONOSCOPY WITH PROPOFOL ;  Surgeon: Urban Garden, MD;  Location: [facility];  Service: Gastroenterology;  Laterality: N/A;  9:00   ESOPHAGOGASTRODUODENOSCOPY (EGD) WITH PROPOFOL  N/A 09/17/2020   Procedure: ESOPHAGOGASTRODUODENOSCOPY (EGD) WITH PROPOFOL ;  Surgeon: Urban Garden, MD;  [facility]: [facility];  Service: Gastroenterology;  Laterality: N/A;   ESOPHAGOGASTRODUODENOSCOPY (EGD) WITH PROPOFOL  N/A 12/08/2022   Procedure: ESOPHAGOGASTRODUODENOSCOPY (EGD) WITH PROPOFOL ;  Surgeon: Urban Garden, MD;  Location: [facility];  Service: Gastroenterology;  Laterality: N/A;  2:30PM;ASA 3   HERNIA REPAIR     LEFT HEART CATH AND CORONARY ANGIOGRAPHY N/A 07/12/2017   Procedure: LEFT HEART CATH AND  CORONARY ANGIOGRAPHY;  Surgeon: Arty Binning, MD;  Location: [facility] INVASIVE CV LAB;  Service: Cardiovascular;  Laterality: N/A;    Current Outpatient Medications  Medication Sig Dispense Refill   ACCU-CHEK AVIVA PLUS test strip Inject 1 strip as directed daily.  4   acetaminophen  (TYLENOL ) 500 MG tablet Take 500 mg by mouth every 6  (six) hours as needed for mild pain or headache. As needed.     albuterol  (VENTOLIN  HFA) 108 (90 Base) MCG/ACT inhaler Inhale 2 puffs into the lungs every 6 (six) hours as needed for wheezing or shortness of breath. 8 g 2   aspirin  325 MG tablet Take 325 mg by mouth daily.     bisacodyl (DULCOLAX) 5 MG EC tablet Take 5 mg by mouth. One every other day     calcitRIOL (ROCALTROL) 0.25 MCG capsule Take 0.25 mcg by mouth daily.     Calcium  Carb-Cholecalciferol (CALCIUM  600-D PO) Take 1 tablet by mouth daily.     Continuous Glucose Sensor (DEXCOM G7 SENSOR) MISC by Does not apply route.     EPINEPHrine 0.3 mg/0.3 mL IJ SOAJ injection Inject 0.3 mg into the muscle as needed for anaphylaxis.     ferrous sulfate 325 (65 FE) MG tablet Take 325 mg by mouth daily with breakfast.     furosemide  (LASIX ) 20 MG tablet TAKE 1 TABLET BY MOUTH DAILY.  (MAY TAKE EXTRA 1 TABLET AS  NEEDED FOR SEVERE SWELLING) 200 tablet 2   Lancets Thin MISC Inject 1 Stick into the skin daily.     levothyroxine  (SYNTHROID ) 88 MCG tablet Take 1 tablet (88 mcg total) by mouth daily before breakfast. 90 tablet 3   Multiple Vitamins-Minerals (MULTIVITAMIN ADULT) CHEW Chew 1 tablet by mouth daily at 12 noon.     NOVOFINE PEN NEEDLE 32G X 6 MM MISC Use to inject insulin  4 times daily 200 each 2   olmesartan  (BENICAR ) 20 MG tablet TAKE 1 TABLET BY MOUTH DAILY 100 tablet 2   omeprazole  (PRILOSEC) 40 MG capsule Take 1 capsule (40 mg total) by mouth daily. 90 capsule 3   potassium chloride  (KLOR-CON  M) 10 MEQ tablet TAKE 1 TABLET BY MOUTH DAILY 100 tablet 2   simvastatin  (ZOCOR ) 20 MG tablet Take 1 tablet (20 mg total) by mouth at bedtime. 90 tablet 3   terbinafine  (LAMISIL  AT) 1 % cream Apply 1 Application topically 2 (two) times daily. 42 g 1   tirzepatide  (MOUNJARO ) 15 MG/0.5ML Pen Inject 15 mg into the skin once a week. 6 mL 3   TRESIBA  FLEXTOUCH 200 UNIT/ML FlexTouch Pen Inject 66 Units into the skin daily at 10 pm. 45 mL 3   Vibegron   (GEMTESA ) 75 MG TABS Take 1 tablet (75 mg total) by mouth daily. 30 tablet 11   No current facility-administered medications for this visit.    Allergies as of 10/16/2023 - Review Complete 10/16/2023  Allergen Reaction Noted   Corn oil Rash 12/26/2017   Other Rash 12/22/2015   Peanut allergen powder-dnfp Rash 12/26/2017   Peanut-containing drug products Rash 12/22/2015   Penicillins Rash     Social History   Socioeconomic History   Marital status: Married    Spouse name: Not on file   Number of children: 2   Years of education: Not on file   Highest education level: Not on file  Occupational History   Occupation: retired  Tobacco Use   Smoking status: Never    Passive exposure: Never   Smokeless tobacco:  Never  Vaping Use   Vaping status: Never Used  Substance and Sexual Activity   Alcohol  use: Not Currently    Comment: occasionally   Drug use: No   Sexual activity: Never    Partners: Male  Other Topics Concern   Not on file  Social History Narrative   Not on file   Social Drivers of Health   Financial Resource Strain: Low Risk  (11/30/2022)   Overall Financial Resource Strain (CARDIA)    Difficulty of Paying Living Expenses: Not hard at all  Food Insecurity: No Food Insecurity (11/30/2022)   Hunger Vital Sign    Worried About Running Out of Food in the Last Year: Never true    Ran Out of Food in the Last Year: Never true  Transportation Needs: No Transportation Needs (11/30/2022)   PRAPARE - Administrator, Civil Service (Medical): No    Lack of Transportation (Non-Medical): No  Physical Activity: Inactive (11/30/2022)   Exercise Vital Sign    Days of Exercise per Week: 0 days    Minutes of Exercise per Session: 0 min  Stress: No Stress Concern Present (11/30/2022)   [employer] of Occupational Health - Occupational Stress Questionnaire    Feeling of Stress : Not at all  Social Connections: Moderately Isolated (11/30/2022)   Social Connection and  Isolation Panel    Frequency of Communication with Friends and Family: More than three times a week    Frequency of Social Gatherings with Friends and Family: More than three times a week    Attends Religious Services: Never    Database administrator or Organizations: No    Attends Engineer, structural: Never    Marital Status: Married    Review of systems General: negative for malaise, night sweats, fever, chills, weight loss Neck: Negative for lumps, goiter, pain and significant neck swelling Resp: Negative for cough, wheezing, dyspnea at rest CV: Negative for chest pain, leg swelling, palpitations, orthopnea GI: denies melena, hematochezia, nausea, vomiting, diarrhea, constipation, dysphagia, odyonophagia, early satiety or unintentional weight loss.  The remainder of the review of systems is noncontributory.  Physical Exam: BP 137/78 (BP Location: Left Arm, Patient Position: Sitting, Cuff Size: Normal)   Pulse 78   Temp (!) 97.5 F (36.4 C) (Temporal)   Ht 5' (1.524 m)   Wt 268 lb 6.4 oz (121.7 kg)   BMI 52.42 kg/m  General:   Alert and oriented. No distress noted. Pleasant and cooperative.  Head:  Normocephalic and atraumatic. Eyes:  Conjuctiva clear without scleral icterus. Mouth:  Oral mucosa pink and moist. Good dentition. No lesions. Heart: Normal rate and rhythm, s1 and s2 heart sounds present.  Lungs: Clear lung sounds in all lobes. Respirations equal and unlabored. Abdomen:  +BS, soft, non-tender and non-distended. No rebound or guarding. No HSM or masses noted. Neurologic:  Alert and  oriented x4 Psych:  Alert and cooperative. Normal mood and affect.  Invalid input(s): 6 MONTHS   ASSESSMENT: Jessica Strong is a 78 y.o. female presenting today for follow up of duodenitis, abdominal pain and constipation  Duodenitis/abdominal pain: resolved with use of omeprazole  40mg  daily. She has done well since being on this since August. She inquired how long she  should remain on this, we discussed she can trial off of PPI at this time and see how she does, if she has recurrence of symptoms, she will need to resume. States she just got a refill and will probably  finish that then trial off of it.  Constipation: no current issues since being on Mounjaro . Having regular BMs.   PLAN:  -can trial off of PPI but will need to resume if symptoms recur -Increase water intake, aim for atleast 64 oz per day -Increase fruits, veggies and whole grains, kiwi and prunes are especially good for constipation  All questions were answered, patient verbalized understanding and is in agreement with plan as outlined above.   Follow Up: 1 year   Luke Rigsbee L. Adrien Alberta, MSN, APRN, AGNP-C Adult-Gerontology Nurse Practitioner [facility]  I have reviewed the note and agree with the APP's assessment as described in this progress note  Samantha Cress, MD Gastroenterology and Hepatology [facility] Gastroenterology

## 2023-10-18 ENCOUNTER — Encounter: Payer: Self-pay | Admitting: Nurse Practitioner

## 2023-10-18 ENCOUNTER — Ambulatory Visit: Admitting: Nurse Practitioner

## 2023-10-18 VITALS — BP 110/70 | HR 71 | Ht 60.0 in | Wt 268.6 lb

## 2023-10-18 DIAGNOSIS — E7849 Other hyperlipidemia: Secondary | ICD-10-CM

## 2023-10-18 DIAGNOSIS — E1122 Type 2 diabetes mellitus with diabetic chronic kidney disease: Secondary | ICD-10-CM | POA: Diagnosis not present

## 2023-10-18 DIAGNOSIS — Z794 Long term (current) use of insulin: Secondary | ICD-10-CM | POA: Diagnosis not present

## 2023-10-18 DIAGNOSIS — E039 Hypothyroidism, unspecified: Secondary | ICD-10-CM

## 2023-10-18 DIAGNOSIS — Z808 Family history of malignant neoplasm of other organs or systems: Secondary | ICD-10-CM | POA: Diagnosis not present

## 2023-10-18 DIAGNOSIS — E785 Hyperlipidemia, unspecified: Secondary | ICD-10-CM

## 2023-10-18 DIAGNOSIS — N1832 Chronic kidney disease, stage 3b: Secondary | ICD-10-CM

## 2023-10-18 DIAGNOSIS — Z7985 Long-term (current) use of injectable non-insulin antidiabetic drugs: Secondary | ICD-10-CM

## 2023-10-18 DIAGNOSIS — I10 Essential (primary) hypertension: Secondary | ICD-10-CM

## 2023-10-18 DIAGNOSIS — E782 Mixed hyperlipidemia: Secondary | ICD-10-CM

## 2023-10-18 NOTE — Progress Notes (Signed)
 Endocrinology Follow Up Note       10/18/2023, 2:43 PM   Subjective:    Patient ID: Jessica Strong, female    DOB: 01/09/1946.  Jessica Strong is being seen in follow up after being seen in consultation for management of currently uncontrolled symptomatic diabetes requested by  Zarwolo, Gloria, FNP.   Past Medical History:  Diagnosis Date   Allergy    Anemia    Anxiety    Arthritis    Asthma    Cataract    cervical ca 1990   Depression    Diabetes (HCC)    Dyspnea    Hypercholesterolemia    Hypertension    Hypothyroidism    Myocardial infarction (HCC) 2005   Sleep apnea    on CPAP 14 mm PS since February 2017   Stroke [facility]) 2007   weakness on R side   Vitamin D  deficiency     Past Surgical History:  Procedure Laterality Date   ABDOMINAL HYSTERECTOMY     BARIATRIC SURGERY     BIOPSY  09/17/2020   Procedure: BIOPSY;  Surgeon: Urban Garden, MD;  Location: [facility];  Service: Gastroenterology;;   BIOPSY  12/08/2022   Procedure: BIOPSY;  Surgeon: Urban Garden, MD;  Location: [facility];  Service: Gastroenterology;;   BREAST BIOPSY Right    beningn   CARDIAC CATHETERIZATION N/A 12/24/2015   Procedure: Right/Left Heart Cath and Coronary Angiography;  Surgeon: Lucendia Rusk, MD;  Location: [facility] INVASIVE CV LAB;  Service: Cardiovascular;  Laterality: N/A;   COLONOSCOPY  2018   COLONOSCOPY WITH PROPOFOL  N/A 09/17/2020   Procedure: COLONOSCOPY WITH PROPOFOL ;  Surgeon: Urban Garden, MD;  Location: [facility];  Service: Gastroenterology;  Laterality: N/A;  9:00   ESOPHAGOGASTRODUODENOSCOPY (EGD) WITH PROPOFOL  N/A 09/17/2020   Procedure: ESOPHAGOGASTRODUODENOSCOPY (EGD) WITH PROPOFOL ;  Surgeon: Urban Garden, MD;  [facility]: [facility];  Service: Gastroenterology;  Laterality: N/A;   ESOPHAGOGASTRODUODENOSCOPY (EGD) WITH PROPOFOL  N/A 12/08/2022    Procedure: ESOPHAGOGASTRODUODENOSCOPY (EGD) WITH PROPOFOL ;  Surgeon: Urban Garden, MD;  Location: [facility];  Service: Gastroenterology;  Laterality: N/A;  2:30PM;ASA 3   HERNIA REPAIR     LEFT HEART CATH AND CORONARY ANGIOGRAPHY N/A 07/12/2017   Procedure: LEFT HEART CATH AND CORONARY ANGIOGRAPHY;  Surgeon: Arty Binning, MD;  Location: [facility] INVASIVE CV LAB;  Service: Cardiovascular;  Laterality: N/A;    Social History   Socioeconomic History   Marital status: Married    Spouse name: Not on file   Number of children: 2   Years of education: Not on file   Highest education level: Not on file  Occupational History   Occupation: retired  Tobacco Use   Smoking status: Never    Passive exposure: Never   Smokeless tobacco: Never  Vaping Use   Vaping status: Never Used  Substance and Sexual Activity   Alcohol  use: Not Currently    Comment: occasionally   Drug use: No   Sexual activity: Never    Partners: Male  Other Topics Concern   Not on file  Social History Narrative   Not on file   Social  Drivers of Health   Financial Resource Strain: Low Risk  (11/30/2022)   Overall Financial Resource Strain (CARDIA)    Difficulty of Paying Living Expenses: Not hard at all  Food Insecurity: No Food Insecurity (11/30/2022)   Hunger Vital Sign    Worried About Running Out of Food in the Last Year: Never true    Ran Out of Food in the Last Year: Never true  Transportation Needs: No Transportation Needs (11/30/2022)   PRAPARE - Administrator, Civil Service (Medical): No    Lack of Transportation (Non-Medical): No  Physical Activity: Inactive (11/30/2022)   Exercise Vital Sign    Days of Exercise per Week: 0 days    Minutes of Exercise per Session: 0 min  Stress: No Stress Concern Present (11/30/2022)   [employer] of Occupational Health - Occupational Stress Questionnaire    Feeling of Stress : Not at all  Social Connections: Moderately Isolated (11/30/2022)    Social Connection and Isolation Panel    Frequency of Communication with Friends and Family: More than three times a week    Frequency of Social Gatherings with Friends and Family: More than three times a week    Attends Religious Services: Never    Database administrator or Organizations: No    Attends Engineer, structural: Never    Marital Status: Married    Family History  Problem Relation Age of Onset   Cancer Mother    Colon cancer Mother    Diabetes Mother    Hypertension Father    Thyroid  disease Father    Thyroid  cancer Father    Diabetes Father    Esophageal cancer Father    Breast cancer Maternal Aunt    Colon cancer Maternal Aunt    Breast cancer Maternal Aunt    Colon cancer Maternal Grandmother    Stroke Maternal Grandfather    Stomach cancer Paternal Grandfather    Colon cancer Son    Colon polyps Neg Hx    Rectal cancer Neg Hx     Outpatient Encounter Medications as of 10/18/2023  Medication Sig   ACCU-CHEK AVIVA PLUS test strip Inject 1 strip as directed daily.   acetaminophen  (TYLENOL ) 500 MG tablet Take 500 mg by mouth every 6 (six) hours as needed for mild pain or headache. As needed.   albuterol  (VENTOLIN  HFA) 108 (90 Base) MCG/ACT inhaler Inhale 2 puffs into the lungs every 6 (six) hours as needed for wheezing or shortness of breath.   aspirin  325 MG tablet Take 325 mg by mouth daily.   bisacodyl (DULCOLAX) 5 MG EC tablet Take 5 mg by mouth. One every other day   calcitRIOL (ROCALTROL) 0.25 MCG capsule Take 0.25 mcg by mouth daily.   Calcium  Carb-Cholecalciferol (CALCIUM  600-D PO) Take 1 tablet by mouth daily.   Continuous Glucose Sensor (DEXCOM G7 SENSOR) MISC by Does not apply route.   EPINEPHrine 0.3 mg/0.3 mL IJ SOAJ injection Inject 0.3 mg into the muscle as needed for anaphylaxis.   ferrous sulfate 325 (65 FE) MG tablet Take 325 mg by mouth daily with breakfast.   furosemide  (LASIX ) 20 MG tablet TAKE 1 TABLET BY MOUTH DAILY.  (MAY TAKE  EXTRA 1 TABLET AS  NEEDED FOR SEVERE SWELLING)   Lancets Thin MISC Inject 1 Stick into the skin daily.   levothyroxine  (SYNTHROID ) 88 MCG tablet Take 1 tablet (88 mcg total) by mouth daily before breakfast.   Multiple Vitamin (QUINTABS) TABS Take 1 tablet  by mouth daily.   Multiple Vitamins-Minerals (MULTIVITAMIN ADULT) CHEW Chew 1 tablet by mouth daily at 12 noon.   NOVOFINE PEN NEEDLE 32G X 6 MM MISC Use to inject insulin  4 times daily   olmesartan  (BENICAR ) 20 MG tablet TAKE 1 TABLET BY MOUTH DAILY   omeprazole  (PRILOSEC) 40 MG capsule Take 1 capsule (40 mg total) by mouth daily.   potassium chloride  (KLOR-CON  M) 10 MEQ tablet TAKE 1 TABLET BY MOUTH DAILY (Patient taking differently: Take 10 mEq by mouth daily. Patient is taking 5 mg daily)   simvastatin  (ZOCOR ) 20 MG tablet Take 1 tablet (20 mg total) by mouth at bedtime.   tirzepatide  (MOUNJARO ) 15 MG/0.5ML Pen Inject 15 mg into the skin once a week.   TRESIBA  FLEXTOUCH 200 UNIT/ML FlexTouch Pen Inject 66 Units into the skin daily at 10 pm.   Vibegron  (GEMTESA ) 75 MG TABS Take 1 tablet (75 mg total) by mouth daily.   terbinafine  (LAMISIL  AT) 1 % cream Apply 1 Application topically 2 (two) times daily. (Patient not taking: Reported on 10/18/2023)   No facility-administered encounter medications on file as of 10/18/2023.    ALLERGIES: Allergies  Allergen Reactions   Corn Oil Rash   Other Rash    PT STATES SHE IS ALLERGIC TO MOST FOODS-STATES IT WAS FOUND IN ALLERGY TESTING   Peanut Allergen Powder-Dnfp Rash   Peanut-Containing Drug Products Rash   Penicillins Rash    Has patient had a PCN reaction causing immediate rash, facial/tongue/throat swelling, SOB or lightheadedness with hypotension: Yes Has patient had a PCN reaction causing severe rash involving mucus membranes or skin necrosis: No Has patient had a PCN reaction that required hospitalization No Has patient had a PCN reaction occurring within the last 10 years: No. Has not had  in 40-50 years If all of the above answers are NO, then may proceed with Cephalosporin use.    VACCINATION STATUS: Immunization History  Administered Date(s) Administered   Moderna Sars-Covid-2 Vaccination 07/11/2019, 08/08/2019, 08/11/2020   PNEUMOCOCCAL CONJUGATE-20 02/21/2022   Zoster Recombinant(Shingrix) 08/17/2022, 10/02/2022    Diabetes She presents for her follow-up diabetic visit. She has type 2 diabetes mellitus. Onset time: Diagnosed at approx age of 23. Her disease course has been improving. There are no hypoglycemic associated symptoms. Associated symptoms include fatigue and foot paresthesias. Pertinent negatives for diabetes include no polydipsia, no polyuria and no weight loss. There are no hypoglycemic complications. Symptoms are improving. Diabetic complications include a CVA, heart disease, nephropathy and peripheral neuropathy. Risk factors for coronary artery disease include diabetes mellitus, dyslipidemia, family history, obesity, hypertension, post-menopausal and sedentary lifestyle. Current diabetic treatment includes insulin  injections (and Mounjaro ). She is compliant with treatment most of the time. Her weight is decreasing steadily. She is following a generally healthy diet. When asked about meal planning, she reported none. She has not had a previous visit with a dietitian. She rarely participates in exercise. Her home blood glucose trend is decreasing steadily. Her breakfast blood glucose range is generally 130-140 mg/dl. Her overall blood glucose range is 140-180 mg/dl. (She presents today, accompanied by her husband, with her CGM showing mostly at target glycemic profile.  Her most recent A1c on 6/16 was 8.2%, improving from last visit of 9.1%.  Analysis of her CGM shows TIR 62%, TAR 38%, TBR 0% with a GMI of 7.5%. ) An ACE inhibitor/angiotensin II receptor blocker is being taken. She does not see a podiatrist.Eye exam is current.  Hypertension This is a chronic  problem. The current episode started more than 1 year ago. The problem has been resolved since onset. The problem is controlled. Agents associated with hypertension include thyroid  hormones. Risk factors for coronary artery disease include sedentary lifestyle, obesity, post-menopausal state, dyslipidemia, diabetes mellitus and family history. Past treatments include angiotensin blockers and diuretics. The current treatment provides moderate improvement. Compliance problems include diet and exercise.  Hypertensive end-organ damage includes kidney disease, CAD/MI and CVA. Identifiable causes of hypertension include a thyroid  problem.     Review of systems  Constitutional: + stable body weight, current Body mass index is 52.46 kg/m., + fatigue, no subjective hyperthermia, no subjective hypothermia Eyes: no blurry vision, no xerophthalmia ENT: no sore throat, no nodules palpated in throat, no dysphagia/odynophagia, no hoarseness Cardiovascular: no chest pain, no shortness of breath, no palpitations, + leg swelling Respiratory: no cough, + shortness of breath with exertion Gastrointestinal: no nausea/vomiting/diarrhea Musculoskeletal: no muscle/joint aches, right side weakness from previous CVA Skin: no rashes, no hyperemia Neurological: no tremors, no dizziness Psychiatric: + significant depression, no anxiety  Objective:     BP 110/70 (BP Location: Left Arm, Patient Position: Sitting, Cuff Size: Large)   Pulse 71   Ht 5' (1.524 m)   Wt 268 lb 9.6 oz (121.8 kg)   BMI 52.46 kg/m   Wt Readings from Last 3 Encounters:  10/18/23 268 lb 9.6 oz (121.8 kg)  10/16/23 268 lb 6.4 oz (121.7 kg)  10/11/23 269 lb 6.4 oz (122.2 kg)     BP Readings from Last 3 Encounters:  10/18/23 110/70  10/16/23 137/78  10/11/23 (!) 148/75     Physical Exam- Limited  Constitutional:  Body mass index is 52.46 kg/m. , not in acute distress, normal state of mind Eyes:  EOMI, no exophthalmos Musculoskeletal:  minimal right sided weakness- residual from previous stroke Skin:  no rashes, no hyperemia Neurological: no tremor with outstretched hands  Diabetic Foot Exam - Simple   No data filed     CMP ( most recent) CMP     Component Value Date/Time   NA 141 10/15/2023 1102   K 5.2 10/15/2023 1102   CL 106 10/15/2023 1102   CO2 17 (L) 10/15/2023 1102   GLUCOSE 150 (H) 10/15/2023 1102   GLUCOSE 172 (H) 09/15/2020 1037   BUN 29 (H) 10/15/2023 1102   CREATININE 1.89 (H) 10/15/2023 1102   CALCIUM  9.3 10/15/2023 1102   PROT 6.8 10/15/2023 1102   ALBUMIN  4.1 10/15/2023 1102   AST 18 10/15/2023 1102   ALT 14 10/15/2023 1102   ALKPHOS 159 (H) 10/15/2023 1102   BILITOT <0.2 10/15/2023 1102   GFRNONAA 32 (L) 09/15/2020 1037   GFRAA 48 (L) 07/09/2017 1112     Diabetic Labs (most recent): Lab Results  Component Value Date   HGBA1C 8.2 (H) 10/15/2023   HGBA1C 9.1 (H) 06/18/2023   HGBA1C 7.7 (A) 03/13/2023   MICROALBUR 150 07/11/2021     Lipid Panel ( most recent) Lipid Panel     Component Value Date/Time   CHOL 147 10/15/2023 1102   TRIG 214 (H) 10/15/2023 1102   HDL 45 10/15/2023 1102   CHOLHDL 3.3 10/15/2023 1102   LDLCALC 67 10/15/2023 1102   LABVLDL 35 10/15/2023 1102      Lab Results  Component Value Date   TSH 1.300 10/15/2023   TSH 1.600 06/18/2023   TSH 1.830 02/21/2023   TSH 3.020 11/27/2022   TSH 3.330 09/06/2022   TSH 3.880 04/17/2022   TSH  5.750 (H) 11/04/2021   TSH 1.533 09/29/2020   FREET4 1.25 10/15/2023   FREET4 1.37 06/18/2023   FREET4 1.35 02/21/2023   FREET4 1.29 11/27/2022   FREET4 1.23 09/06/2022   FREET4 1.50 04/17/2022   FREET4 1.06 11/04/2021           Assessment & Plan:   1) Type 2 diabetes mellitus with stage 3b chronic kidney disease, with long-term current use of insulin  (HCC)  She presents today, accompanied by her husband, with her CGM showing mostly at target glycemic profile.  Her most recent A1c on 6/16 was 8.2%, improving  from last visit of 9.1%.  Analysis of her CGM shows TIR 62%, TAR 38%, TBR 0% with a GMI of 7.5%.   - Smt  Melven Stable Tibbs has currently uncontrolled symptomatic type 2 DM since 78 years of age.   -Recent labs reviewed.  - I had a long discussion with her about the progressive nature of diabetes and the pathology behind its complications. -her diabetes is complicated by CVA, MI, CAD, OSA, CKD and she remains at a high risk for more acute and chronic complications which include CAD, CVA, CKD, retinopathy, and neuropathy. These are all discussed in detail with her.  The following Lifestyle Medicine recommendations according to American College of Lifestyle Medicine [facility]) were discussed and offered to patient and she agrees to start the journey:  A. Whole Foods, Plant-based plate comprising of fruits and vegetables, plant-based proteins, whole-grain carbohydrates was discussed in detail with the patient.   A list for source of those nutrients were also provided to the patient.  Patient will use only water or unsweetened tea for hydration. B.  The need to stay away from risky substances including alcohol , smoking; obtaining 7 to 9 hours of restorative sleep, at least 150 minutes of moderate intensity exercise weekly, the importance of healthy social connections,  and stress reduction techniques were discussed. C.  A full color page of  Calorie density of various food groups per pound showing examples of each food groups was provided to the patient.  - Nutritional counseling repeated at each appointment due to patients tendency to fall back in to old habits.  - The patient admits there is a room for improvement in their diet and drink choices. -  Suggestion is made for the patient to avoid simple carbohydrates from their diet including Cakes, Sweet Desserts / Pastries, Ice Cream, Soda (diet and regular), Sweet Tea, Candies, Chips, Cookies, Sweet Pastries, Store Bought Juices, Alcohol  in Excess of 1-2 drinks a  day, Artificial Sweeteners, Coffee Creamer, and Sugar-free Products. This will help patient to have stable blood glucose profile and potentially avoid unintended weight gain.   - I encouraged the patient to switch to unprocessed or minimally processed complex starch and increased protein intake (animal or plant source), fruits, and vegetables.   - Patient is advised to stick to a routine mealtimes to eat 3 meals a day and avoid unnecessary snacks (to snack only to correct hypoglycemia).  - she will be scheduled with Penny Crumpton, RDN, CDE for diabetes education.  - I have approached her with the following individualized plan to manage her diabetes and patient agrees:   -Based on her stable, at goal glycemic profile, no changes will be made to her regimen today.  She is advised to continue Tresiba  66 units SQ nightly and continue Mounjaro  15 mg SQ weekly.  She can stay off prandial insulin  for now.  -she is encouraged to continue monitoring glucose  4 times daily (using her CGM), before meals and before bed, and to call the clinic if she has readings less than 70 or above 300 for 3 tests in a row.    - she is warned not to take insulin  without proper monitoring per orders. - Adjustment parameters are given to her for hypo and hyperglycemia in writing.  - her Metformin  was discontinued, risk outweighs benefit for this patient given recent renal decline.  - Specific targets for  A1c; LDL, HDL, and Triglycerides were discussed with the patient.  2) Blood Pressure /Hypertension:  her blood pressure is controlled to target for her age.   she is advised to continue her current medications including Benicar  20 mg po daily, Lasix  20 mg po daily as needed.  3) Lipids/Hyperlipidemia:    Her most recent lipid panel from 10/15/23 shows controlled LDL of 67 and elevated triglycerides of 214.  She is advised to continue Zocor  20 mg po daily at bedtime.    4)  Weight/Diet:  her Body mass index is 52.46  kg/m.  -  clearly complicating her diabetes care.   she is a candidate for weight loss. I discussed with her the fact that loss of 5 - 10% of her  current body weight will have the most impact on her diabetes management.  Exercise, and detailed carbohydrates information provided  -  detailed on discharge instructions.  5) Hypothyroidism Her previsit TFTs are consistent with appropriate hormone replacement.  She is advised to continue Levothyroxine  88 mcg po daily before breakfast.   The correct intake of thyroid  hormone (Levothyroxine , Synthroid ), is on empty stomach first thing in the morning, with water, separated by at least 30 minutes from breakfast and other medications,  and separated by more than 4 hours from calcium , iron, multivitamins, acid reflux medications (PPIs).  - This medication is a life-long medication and will be needed to correct thyroid  hormone imbalances for the rest of your life.  The dose may change from time to time, based on thyroid  blood work.  - It is extremely important to be consistent taking this medication, near the same time each morning.  -AVOID TAKING PRODUCTS CONTAINING BIOTIN (commonly found in Hair, Skin, Nails vitamins) AS IT INTERFERES WITH THE VALIDITY OF THYROID  FUNCTION BLOOD TESTS.  6) Chronic Care/Health Maintenance: -she is on ACEI/ARB and not on Statin medications and is encouraged to initiate and continue to follow up with Ophthalmology, Dentist, Podiatrist at least yearly or according to recommendations, and advised to stay away from smoking. I have recommended yearly flu vaccine and pneumonia vaccine at least every 5 years; moderate intensity exercise for up to 150 minutes weekly; and sleep for at least 7 hours a day.  - she is advised to maintain close follow up with Zarwolo, Gloria, FNP for primary care needs, as well as her other providers for optimal and coordinated care.     I spent  26  minutes in the care of the patient today including  review of labs from CMP, Lipids, Thyroid  Function, Hematology (current and previous including abstractions from other facilities); face-to-face time discussing  her blood glucose readings/logs, discussing hypoglycemia and hyperglycemia episodes and symptoms, medications doses, her options of short and long term treatment based on the latest standards of care / guidelines;  discussion about incorporating lifestyle medicine;  and documenting the encounter. Risk reduction counseling performed per USPSTF guidelines to reduce obesity and cardiovascular risk factors.     Please refer to Patient Instructions  for Blood Glucose Monitoring and Insulin /Medications Dosing Guide  in media tab for additional information. Please  also refer to  Patient Self Inventory in the Media  tab for reviewed elements of pertinent patient history.  Cathye  CHAYAH MCKEE participated in the discussions, expressed understanding, and voiced agreement with the above plans.  All questions were answered to her satisfaction. she is encouraged to contact clinic should she have any questions or concerns prior to her return visit.     Follow up plan: - Return in about 4 months (around 02/17/2024) for Diabetes F/U with A1c in office, No previsit labs, Bring meter and logs.  Hulon Magic, [facility] [address] [place], [name] [address] Phone: [phone] Fax: [phone]  10/18/2023, 2:43 PM

## 2023-10-19 ENCOUNTER — Ambulatory Visit (HOSPITAL_COMMUNITY)
Admission: RE | Admit: 2023-10-19 | Discharge: 2023-10-19 | Disposition: A | Discharge status: Home / Self Care | Source: Ambulatory Visit | Attending: Family Medicine | Admitting: Family Medicine

## 2023-10-19 ENCOUNTER — Encounter (HOSPITAL_COMMUNITY): Payer: Self-pay

## 2023-10-19 DIAGNOSIS — Z1231 Encounter for screening mammogram for malignant neoplasm of breast: Secondary | ICD-10-CM | POA: Diagnosis present

## 2023-10-20 ENCOUNTER — Encounter (HOSPITAL_COMMUNITY): Payer: Self-pay

## 2023-10-20 ENCOUNTER — Emergency Department (HOSPITAL_COMMUNITY)

## 2023-10-20 ENCOUNTER — Other Ambulatory Visit: Payer: Self-pay

## 2023-10-20 ENCOUNTER — Inpatient Hospital Stay (HOSPITAL_COMMUNITY)
Admission: EM | Admit: 2023-10-20 | Discharge: 2023-10-26 | DRG: 100 | Disposition: A | Discharge status: Home Health | Attending: Family Medicine | Admitting: Family Medicine

## 2023-10-20 DIAGNOSIS — E1122 Type 2 diabetes mellitus with diabetic chronic kidney disease: Secondary | ICD-10-CM | POA: Diagnosis present

## 2023-10-20 DIAGNOSIS — E7800 Pure hypercholesterolemia, unspecified: Secondary | ICD-10-CM | POA: Diagnosis present

## 2023-10-20 DIAGNOSIS — N184 Chronic kidney disease, stage 4 (severe): Secondary | ICD-10-CM | POA: Diagnosis present

## 2023-10-20 DIAGNOSIS — I129 Hypertensive chronic kidney disease with stage 1 through stage 4 chronic kidney disease, or unspecified chronic kidney disease: Secondary | ICD-10-CM | POA: Diagnosis present

## 2023-10-20 DIAGNOSIS — G9389 Other specified disorders of brain: Secondary | ICD-10-CM | POA: Diagnosis present

## 2023-10-20 DIAGNOSIS — E66813 Obesity, class 3: Secondary | ICD-10-CM

## 2023-10-20 DIAGNOSIS — Z8249 Family history of ischemic heart disease and other diseases of the circulatory system: Secondary | ICD-10-CM | POA: Diagnosis not present

## 2023-10-20 DIAGNOSIS — Z823 Family history of stroke: Secondary | ICD-10-CM

## 2023-10-20 DIAGNOSIS — Z79899 Other long term (current) drug therapy: Secondary | ICD-10-CM | POA: Diagnosis not present

## 2023-10-20 DIAGNOSIS — I679 Cerebrovascular disease, unspecified: Secondary | ICD-10-CM | POA: Diagnosis present

## 2023-10-20 DIAGNOSIS — E785 Hyperlipidemia, unspecified: Secondary | ICD-10-CM | POA: Diagnosis present

## 2023-10-20 DIAGNOSIS — Z9884 Bariatric surgery status: Secondary | ICD-10-CM

## 2023-10-20 DIAGNOSIS — Z6841 Body Mass Index (BMI) 40.0 and over, adult: Secondary | ICD-10-CM

## 2023-10-20 DIAGNOSIS — R413 Other amnesia: Secondary | ICD-10-CM | POA: Diagnosis present

## 2023-10-20 DIAGNOSIS — G9340 Encephalopathy, unspecified: Secondary | ICD-10-CM | POA: Insufficient documentation

## 2023-10-20 DIAGNOSIS — I69351 Hemiplegia and hemiparesis following cerebral infarction affecting right dominant side: Secondary | ICD-10-CM

## 2023-10-20 DIAGNOSIS — Z794 Long term (current) use of insulin: Secondary | ICD-10-CM

## 2023-10-20 DIAGNOSIS — Z8349 Family history of other endocrine, nutritional and metabolic diseases: Secondary | ICD-10-CM

## 2023-10-20 DIAGNOSIS — G4733 Obstructive sleep apnea (adult) (pediatric): Secondary | ICD-10-CM | POA: Diagnosis present

## 2023-10-20 DIAGNOSIS — I252 Old myocardial infarction: Secondary | ICD-10-CM

## 2023-10-20 DIAGNOSIS — R4182 Altered mental status, unspecified: Principal | ICD-10-CM

## 2023-10-20 DIAGNOSIS — D631 Anemia in chronic kidney disease: Secondary | ICD-10-CM | POA: Diagnosis present

## 2023-10-20 DIAGNOSIS — N1831 Chronic kidney disease, stage 3a: Secondary | ICD-10-CM | POA: Diagnosis not present

## 2023-10-20 DIAGNOSIS — E119 Type 2 diabetes mellitus without complications: Secondary | ICD-10-CM

## 2023-10-20 DIAGNOSIS — R569 Unspecified convulsions: Secondary | ICD-10-CM | POA: Diagnosis present

## 2023-10-20 DIAGNOSIS — F0390 Unspecified dementia without behavioral disturbance: Secondary | ICD-10-CM | POA: Diagnosis present

## 2023-10-20 DIAGNOSIS — Z833 Family history of diabetes mellitus: Secondary | ICD-10-CM

## 2023-10-20 DIAGNOSIS — Z79890 Hormone replacement therapy (postmenopausal): Secondary | ICD-10-CM | POA: Diagnosis not present

## 2023-10-20 DIAGNOSIS — E1165 Type 2 diabetes mellitus with hyperglycemia: Secondary | ICD-10-CM | POA: Diagnosis not present

## 2023-10-20 DIAGNOSIS — F39 Unspecified mood [affective] disorder: Secondary | ICD-10-CM | POA: Diagnosis present

## 2023-10-20 DIAGNOSIS — I10 Essential (primary) hypertension: Secondary | ICD-10-CM | POA: Diagnosis present

## 2023-10-20 DIAGNOSIS — E039 Hypothyroidism, unspecified: Secondary | ICD-10-CM | POA: Diagnosis present

## 2023-10-20 DIAGNOSIS — Z880 Allergy status to penicillin: Secondary | ICD-10-CM

## 2023-10-20 DIAGNOSIS — F419 Anxiety disorder, unspecified: Secondary | ICD-10-CM | POA: Diagnosis present

## 2023-10-20 DIAGNOSIS — Z7982 Long term (current) use of aspirin: Secondary | ICD-10-CM

## 2023-10-20 DIAGNOSIS — I2510 Atherosclerotic heart disease of native coronary artery without angina pectoris: Secondary | ICD-10-CM | POA: Diagnosis present

## 2023-10-20 DIAGNOSIS — Z888 Allergy status to other drugs, medicaments and biological substances status: Secondary | ICD-10-CM

## 2023-10-20 DIAGNOSIS — Z7985 Long-term (current) use of injectable non-insulin antidiabetic drugs: Secondary | ICD-10-CM

## 2023-10-20 DIAGNOSIS — G9341 Metabolic encephalopathy: Secondary | ICD-10-CM | POA: Diagnosis present

## 2023-10-20 DIAGNOSIS — Z91010 Allergy to peanuts: Secondary | ICD-10-CM

## 2023-10-20 DIAGNOSIS — R4189 Other symptoms and signs involving cognitive functions and awareness: Secondary | ICD-10-CM | POA: Diagnosis not present

## 2023-10-20 LAB — COMPREHENSIVE METABOLIC PANEL WITH GFR
ALT: 16 U/L (ref 0–44)
AST: 19 U/L (ref 15–41)
Albumin: 3.5 g/dL (ref 3.5–5.0)
Alkaline Phosphatase: 107 U/L (ref 38–126)
Anion gap: 12 (ref 5–15)
BUN: 30 mg/dL — ABNORMAL HIGH (ref 8–23)
CO2: 23 mmol/L (ref 22–32)
Calcium: 9.1 mg/dL (ref 8.9–10.3)
Chloride: 107 mmol/L (ref 98–111)
Creatinine, Ser: 1.84 mg/dL — ABNORMAL HIGH (ref 0.44–1.00)
GFR, Estimated: 28 mL/min — ABNORMAL LOW (ref >60)
Glucose, Bld: 99 mg/dL (ref 70–99)
Potassium: 4.4 mmol/L (ref 3.5–5.1)
Sodium: 142 mmol/L (ref 135–145)
Total Bilirubin: 0.5 mg/dL (ref 0.0–1.2)
Total Protein: 6.7 g/dL (ref 6.5–8.1)

## 2023-10-20 LAB — URINALYSIS, ROUTINE W REFLEX MICROSCOPIC
Bilirubin Urine: NEGATIVE
Glucose, UA: NEGATIVE mg/dL
Hgb urine dipstick: NEGATIVE
Ketones, ur: NEGATIVE mg/dL
Leukocytes,Ua: NEGATIVE
Nitrite: NEGATIVE
Protein, ur: NEGATIVE mg/dL
Specific Gravity, Urine: 1.016 (ref 1.005–1.030)
pH: 5 (ref 5.0–8.0)

## 2023-10-20 LAB — CBC WITH DIFFERENTIAL/PLATELET
Abs Immature Granulocytes: 0.04 10*3/uL (ref 0.00–0.07)
Basophils Absolute: 0 10*3/uL (ref 0.0–0.1)
Basophils Relative: 1 %
Eosinophils Absolute: 0.3 10*3/uL (ref 0.0–0.5)
Eosinophils Relative: 4 %
HCT: 33 % — ABNORMAL LOW (ref 36.0–46.0)
Hemoglobin: 10.3 g/dL — ABNORMAL LOW (ref 12.0–15.0)
Immature Granulocytes: 1 %
Lymphocytes Relative: 28 %
Lymphs Abs: 2.4 10*3/uL (ref 0.7–4.0)
MCH: 26.3 pg (ref 26.0–34.0)
MCHC: 31.2 g/dL (ref 30.0–36.0)
MCV: 84.2 fL (ref 80.0–100.0)
Monocytes Absolute: 0.8 10*3/uL (ref 0.1–1.0)
Monocytes Relative: 9 %
Neutro Abs: 5 10*3/uL (ref 1.7–7.7)
Neutrophils Relative %: 57 %
Platelets: 271 10*3/uL (ref 150–400)
RBC: 3.92 MIL/uL (ref 3.87–5.11)
RDW: 14.8 % (ref 11.5–15.5)
WBC: 8.6 10*3/uL (ref 4.0–10.5)
nRBC: 0 % (ref 0.0–0.2)

## 2023-10-20 LAB — TSH: TSH: 1.256 u[IU]/mL (ref 0.350–4.500)

## 2023-10-20 LAB — GLUCOSE, CAPILLARY: Glucose-Capillary: 94 mg/dL (ref 70–99)

## 2023-10-20 LAB — CBG MONITORING, ED: Glucose-Capillary: 106 mg/dL — ABNORMAL HIGH (ref 70–99)

## 2023-10-20 MED ORDER — MELATONIN 3 MG PO TABS: 6.0000 mg | ORAL_TABLET | Freq: Every evening | ORAL | Status: DC | PRN

## 2023-10-20 MED ORDER — ALBUTEROL SULFATE (2.5 MG/3ML) 0.083% IN NEBU: 2.5000 mg | INHALATION_SOLUTION | RESPIRATORY_TRACT | Status: DC | PRN

## 2023-10-20 MED ORDER — PANTOPRAZOLE SODIUM 40 MG PO TBEC
40.0000 mg | DELAYED_RELEASE_TABLET | Freq: Every day | ORAL | Status: DC
  Administered 2023-10-21 – 2023-10-26 (×6): 40 mg via ORAL
  Filled 2023-10-20 (×6): qty 1

## 2023-10-20 MED ORDER — INSULIN ASPART 100 UNIT/ML IJ SOLN
0.0000 [IU] | Freq: Three times a day (TID) | INTRAMUSCULAR | Status: DC
  Administered 2023-10-22: 3 [IU] via SUBCUTANEOUS
  Administered 2023-10-22: 2 [IU] via SUBCUTANEOUS
  Administered 2023-10-23: 3 [IU] via SUBCUTANEOUS
  Administered 2023-10-23 (×2): 2 [IU] via SUBCUTANEOUS
  Administered 2023-10-24 (×3): 3 [IU] via SUBCUTANEOUS
  Administered 2023-10-25: 2 [IU] via SUBCUTANEOUS
  Administered 2023-10-25: 5 [IU] via SUBCUTANEOUS
  Administered 2023-10-25: 3 [IU] via SUBCUTANEOUS
  Administered 2023-10-26 (×2): 5 [IU] via SUBCUTANEOUS

## 2023-10-20 MED ORDER — INSULIN GLARGINE-YFGN 100 UNIT/ML ~~LOC~~ SOLN
28.0000 [IU] | Freq: Every day | SUBCUTANEOUS | Status: DC
  Administered 2023-10-21: 28 [IU] via SUBCUTANEOUS
  Filled 2023-10-20 (×2): qty 0.28

## 2023-10-20 MED ORDER — ACETAMINOPHEN 500 MG PO TABS
1000.0000 mg | ORAL_TABLET | Freq: Four times a day (QID) | ORAL | Status: DC | PRN
  Administered 2023-10-24 – 2023-10-25 (×2): 1000 mg via ORAL
  Filled 2023-10-20 (×2): qty 2

## 2023-10-20 MED ORDER — LEVOTHYROXINE SODIUM 88 MCG PO TABS
88.0000 ug | ORAL_TABLET | Freq: Every day | ORAL | Status: DC
  Administered 2023-10-21 – 2023-10-26 (×6): 88 ug via ORAL
  Filled 2023-10-20 (×6): qty 1

## 2023-10-20 MED ORDER — SODIUM CHLORIDE 0.9% FLUSH
3.0000 mL | Freq: Two times a day (BID) | INTRAVENOUS | Status: DC
  Administered 2023-10-21 – 2023-10-26 (×12): 3 mL via INTRAVENOUS

## 2023-10-20 MED ORDER — ONDANSETRON HCL 4 MG/2ML IJ SOLN: 4.0000 mg | Freq: Four times a day (QID) | INTRAMUSCULAR | Status: DC | PRN

## 2023-10-20 MED ORDER — POLYETHYLENE GLYCOL 3350 17 G PO PACK: 17.0000 g | PACK | Freq: Every day | ORAL | Status: DC | PRN

## 2023-10-20 MED ORDER — LEVETIRACETAM 250 MG PO TABS
250.0000 mg | ORAL_TABLET | Freq: Two times a day (BID) | ORAL | Status: DC
  Administered 2023-10-20 – 2023-10-26 (×12): 250 mg via ORAL
  Filled 2023-10-20 (×12): qty 1

## 2023-10-20 MED ORDER — SIMVASTATIN 20 MG PO TABS
20.0000 mg | ORAL_TABLET | Freq: Every day | ORAL | Status: DC
  Administered 2023-10-21 – 2023-10-25 (×6): 20 mg via ORAL
  Filled 2023-10-20 (×6): qty 1

## 2023-10-20 NOTE — ED Triage Notes (Signed)
 Pt had episode of possible LOC around 1530 today and husband stated taht since the episode, pt has been confused and does not recognize her family. Pt had stroke in 2007. Pt does have memory loss from previous stroke. Pt is only alert to place at this time.

## 2023-10-20 NOTE — Progress Notes (Signed)
 Per ED physician Dr. Dean: Pt is a 78 yo female with pmhx significant for CVA, DM2, CAD, HLD, HTN, Asthma, depression, obesity and hypothyroidism [and aneurysm clipping in 2007]. Pt had an episode today around 1530 with confusion. Her husband said her face twitched a little and she said she could not see well. She did not recognize her husband. Her daughter said she is not acting normally. Family concerned with a stroke. Pt has no complaints and said she can see fine. She has been improving but  not back to baseline yet  CT head 1. No acute intracranial abnormality. 2. Left frontal and temporal lobe encephalomalacia. 3. Old bilateral cerebellar infarcts and old anterior left thalamic infarct.   Basic Metabolic Panel: Recent Labs  Lab 10/15/23 1102 10/20/23 2047  NA 141 142  K 5.2 4.4  CL 106 107  CO2 17* 23  GLUCOSE 150* 99  BUN 29* 30*  CREATININE 1.89* 1.84*  CALCIUM  9.3 9.1    CBC: Recent Labs  Lab 10/15/23 1102 10/20/23 2047  WBC 8.9 8.6  NEUTROABS 5.4 5.0  HGB 10.7* 10.3*  HCT 36.0 33.0*  MCV 85 84.2  PLT 282 271    Coagulation Studies: No results for input(s): LABPROT, INR in the last 72 hours.   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 May not be able to get MRI brain due to aneurysm clipping (old clips, no clear records I see and no prior MRIs I see)  Recommendations:  - Admit for observation to [facility] - Keppra  250 mg BID without loading dose  - MRI brain w/o to confirm no new acute process, if unable to obtain, would repeat head CT at approximately 24 hours since symptom onset or STAT for acute neurological change - If returns to baseline and imaging remains negative for acute process, outpatient neurology follow-up. If continuing to have episodes / not returning to baseline, please reach back out to neurologist on call for consideration of transfer to [facility] for further workup / inpatient neurology  evaluation   Lola Jernigan MD-PhD [facility] [phone] Available 7 PM to 7 AM, outside of these hours please call Neurologist on call as listed on Amion.   These are curbside recommendations based upon the information readily available in the chart on brief review as well as history and examination information provided to me by requesting provider and do not replace a full detailed consult   Discussed with Dr. Dean who will notify patient/family of interprofessional consultation. 12 min of care ***

## 2023-10-20 NOTE — ED Notes (Signed)
 Pt given ice water to drink

## 2023-10-20 NOTE — ED Notes (Signed)
 ED Provider at bedside.

## 2023-10-20 NOTE — ED Notes (Signed)
 Provider at bedside

## 2023-10-20 NOTE — ED Notes (Signed)
 Pt ambulated to the bathroom with assist. Urine sample provided

## 2023-10-20 NOTE — ED Provider Notes (Signed)
 [place] [facility] AT [facility] Provider Note   CSN: [id number] Arrival date & time: 10/20/23  1932     Patient presents with: Altered Mental Status   Jessica Strong is a 79 y.o. female.   Pt is a 78 yo female with pmhx significant for CVA, DM2, CAD, HLD, HTN, Asthma, depression, obesity and hypothyroidism.  Pt had an episode today around 1530 with confusion.  Her husband said her face twitched a little and she thought she could not see well because her eyes were twitching.  She did not recognize her husband.  Her daughter said she is not acting normally.  Family concerned with a stroke.  Pt has no complaints and said she can see fine.        Prior to Admission medications   Medication Sig Start Date End Date Taking? Authorizing Provider  ACCU-CHEK AVIVA PLUS test strip Inject 1 strip as directed daily. 10/15/15   [provider]  acetaminophen  (TYLENOL ) 500 MG tablet Take 500 mg by mouth every 6 (six) hours as needed for mild pain or headache. As needed.    [provider]  albuterol  (VENTOLIN  HFA) 108 (90 Base) MCG/ACT inhaler Inhale 2 puffs into the lungs every 6 (six) hours as needed for wheezing or shortness of breath. 05/24/22   Zarwolo, Gloria, FNP  aspirin  325 MG tablet Take 325 mg by mouth daily.    [provider]  bisacodyl (DULCOLAX) 5 MG EC tablet Take 5 mg by mouth. One every other day    [provider]  calcitRIOL (ROCALTROL) 0.25 MCG capsule Take 0.25 mcg by mouth daily. 10/08/23   [provider]  Calcium  Carb-Cholecalciferol (CALCIUM  600-D PO) Take 1 tablet by mouth daily.    [provider]  Continuous Glucose Sensor (DEXCOM G7 SENSOR) MISC by Does not apply route.    [provider]  EPINEPHrine 0.3 mg/0.3 mL IJ SOAJ injection Inject 0.3 mg into the muscle as needed for anaphylaxis.    [provider]  ferrous sulfate 325 (65 FE) MG tablet Take 325 mg by mouth daily with  breakfast.    [provider]  furosemide  (LASIX ) 20 MG tablet TAKE 1 TABLET BY MOUTH DAILY.  (MAY TAKE EXTRA 1 TABLET AS  NEEDED FOR SEVERE SWELLING) 06/04/23   Alvan Dorn FALCON, MD  Lancets Thin MISC Inject 1 Stick into the skin daily. 01/27/10   [provider]  levothyroxine  (SYNTHROID ) 88 MCG tablet Take 1 tablet (88 mcg total) by mouth daily before breakfast. 03/13/23   Therisa Benton PARAS, NP  Multiple Vitamin (QUINTABS) TABS Take 1 tablet by mouth daily. 12/26/17   [provider]  Multiple Vitamins-Minerals (MULTIVITAMIN ADULT) CHEW Chew 1 tablet by mouth daily at 12 noon.    [provider]  NOVOFINE PEN NEEDLE 32G X 6 MM MISC Use to inject insulin  4 times daily 11/27/22   Zarwolo, Gloria, FNP  olmesartan  (BENICAR ) 20 MG tablet TAKE 1 TABLET BY MOUTH DAILY 08/14/23   Zarwolo, Gloria, FNP  omeprazole  (PRILOSEC) 40 MG capsule Take 1 capsule (40 mg total) by mouth daily. 12/13/22   Eartha Angelia Sieving, MD  potassium chloride  (KLOR-CON  M) 10 MEQ tablet TAKE 1 TABLET BY MOUTH DAILY Patient taking differently: Take 10 mEq by mouth daily. Patient is taking 5 mg daily 06/27/23   Zarwolo, Gloria, FNP  simvastatin  (ZOCOR ) 20 MG tablet Take 1 tablet (20 mg total) by mouth at bedtime. 03/13/23   Therisa Benton  J, NP  terbinafine  (LAMISIL  AT) 1 % cream Apply 1 Application topically 2 (two) times daily. Patient not taking: Reported on 10/18/2023 06/11/23   Zarwolo, Gloria, FNP  tirzepatide  (MOUNJARO ) 15 MG/0.5ML Pen Inject 15 mg into the skin once a week. 03/13/23   Therisa Benton PARAS, NP  TRESIBA  FLEXTOUCH 200 UNIT/ML FlexTouch Pen Inject 66 Units into the skin daily at 10 pm. 07/10/23   Therisa Benton PARAS, NP  Vibegron  (GEMTESA ) 75 MG TABS Take 1 tablet (75 mg total) by mouth daily. 07/02/23   Nieves Cough, MD    Allergies: Corn oil, Other, Peanut allergen powder-dnfp, Peanut-containing drug products, and Penicillins    Review of Systems  All other systems  reviewed and are negative.   Updated Vital Signs BP (!) 121/59   Pulse 64   Temp 98.2 F (36.8 C) (Oral)   Resp (!) 21   Ht 5' (1.524 m)   Wt 121.8 kg   SpO2 96%   BMI 52.44 kg/m   Physical Exam Vitals and nursing note reviewed.  Constitutional:      Appearance: Normal appearance. She is obese.  HENT:     Head: Normocephalic and atraumatic.     Right Ear: External ear normal.     Left Ear: External ear normal.     Nose: Nose normal.     Mouth/Throat:     Mouth: Mucous membranes are moist.     Pharynx: Oropharynx is clear.   Eyes:     Extraocular Movements: Extraocular movements intact.     Conjunctiva/sclera: Conjunctivae normal.     Pupils: Pupils are equal, round, and reactive to light.    Cardiovascular:     Rate and Rhythm: Normal rate and regular rhythm.     Pulses: Normal pulses.     Heart sounds: Normal heart sounds.  Pulmonary:     Effort: Pulmonary effort is normal.     Breath sounds: Normal breath sounds.  Abdominal:     General: Abdomen is flat.     Palpations: Abdomen is soft.   Musculoskeletal:     Cervical back: Normal range of motion and neck supple.     Right lower leg: Edema present.     Left lower leg: Edema present.   Skin:    General: Skin is warm and dry.     Capillary Refill: Capillary refill takes less than 2 seconds.   Neurological:     Mental Status: She is alert.     Comments: Pt is slow to respond; She is oriented to person and place; she keeps crying when questions are asked.  Psychiatric:        Mood and Affect: Mood normal.        Behavior: Behavior normal.     (all labs ordered are listed, but only abnormal results are displayed) Labs Reviewed  CBC WITH DIFFERENTIAL/PLATELET - Abnormal; Notable for the following components:      Result Value   Hemoglobin 10.3 (*)    HCT 33.0 (*)    All other components within normal limits  COMPREHENSIVE METABOLIC PANEL WITH GFR - Abnormal; Notable for the following components:    BUN 30 (*)    Creatinine, Ser 1.84 (*)    GFR, Estimated 28 (*)    All other components within normal limits  CBG MONITORING, ED - Abnormal; Notable for the following components:   Glucose-Capillary 106 (*)    All other components within normal limits  URINALYSIS, ROUTINE W REFLEX MICROSCOPIC  TSH    EKG: EKG Interpretation Date/Time:  Saturday October 20 2023 19:44:42 EDT Ventricular Rate:  68 PR Interval:  217 QRS Duration:  82 QT Interval:  390 QTC Calculation: 415 R Axis:   15  Text Interpretation: Sinus rhythm Atrial premature complex Borderline prolonged PR interval Low voltage, precordial leads No significant change since last tracing Confirmed by Dean Clarity [phone]) on 10/20/2023 8:00:38 PM  Radiology: DG Chest 1 View Result Date: 10/20/2023 CLINICAL DATA:  Altered mental status EXAM: CHEST  1 VIEW COMPARISON:  Chest x-ray 08/10/2023 FINDINGS: The heart size and mediastinal contours are within normal limits. Both lungs are clear. The visualized skeletal structures are unremarkable. IMPRESSION: No active disease. Electronically Signed   By: Greig Pique M.D.   On: 10/20/2023 20:28   CT Head Wo Contrast Result Date: 10/20/2023 CLINICAL DATA:  Altered mental status EXAM: CT HEAD WITHOUT CONTRAST TECHNIQUE: Contiguous axial images were obtained from the base of the skull through the vertex without intravenous contrast. RADIATION DOSE REDUCTION: This exam was performed according to the departmental dose-optimization program which includes automated exposure control, adjustment of the mA and/or kV according to patient size and/or use of iterative reconstruction technique. COMPARISON:  None Available. FINDINGS: Brain: Left frontal and temporal lobe encephalomalacia. Old bilateral cerebellar infarcts and old anterior left thalamic infarct. No mass, hemorrhage or extra-axial collection. 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 Skull: Remote left pterional craniotomy.  Sinuses/Orbits: No fluid levels or advanced mucosal thickening of the visualized paranasal sinuses. No mastoid or middle ear effusion. Normal orbits. Other: None. IMPRESSION: 1. No acute intracranial abnormality. 2. Left frontal and temporal lobe encephalomalacia. 3. Old bilateral cerebellar infarcts and old anterior left thalamic infarct. Electronically Signed   By: Franky Stanford M.D.   On: 10/20/2023 20:18     Procedures   Medications Ordered in the ED  levETIRAcetam  (KEPPRA ) tablet 250 mg (has no administration in time range)                                    Medical Decision Making Amount and/or Complexity of Data Reviewed Labs: ordered. Radiology: ordered.  Risk Prescription drug management. Decision regarding hospitalization.   This patient presents to the ED for concern of ams, this involves an extensive number of treatment options, and is a complaint that carries with it a high risk of complications and morbidity.  The differential diagnosis includes cva, infection, electrolyte abn, hypoglycemia   Co morbidities that complicate the patient evaluation  CVA, DM2, CAD, HLD, HTN, Asthma, depression, obesity and hypothyroidism   Additional history obtained:  Additional history obtained from epic chart review External records from outside source obtained and reviewed including family   Lab Tests:  I Ordered, and personally interpreted labs.  The pertinent results include:  ua nl, cmp nl other than cr elevated at 1.84 (stable)   Imaging Studies ordered:  I ordered imaging studies including cxr and ct head  I independently visualized and interpreted imaging which showed  CXR: No active disease.  CT head: . No acute intracranial abnormality.  2. Left frontal and temporal lobe encephalomalacia.  3. Old bilateral cerebellar infarcts and old anterior left thalamic  infarct.   I agree with the radiologist interpretation   Cardiac Monitoring:  The patient was  maintained on a cardiac monitor.  I personally viewed and interpreted the cardiac monitored which showed an underlying rhythm of:  nsr   Medicines ordered and prescription drug management:  I ordered medication including keppra   for sx  Reevaluation of the patient after these medicines showed that the patient improved I have reviewed the patients home medicines and have made adjustments as needed   Test Considered:  Ct/mri   Critical Interventions:  labs   Consultations Obtained:  I requested consultation with the neurologist (Dr. Jerrie),  and discussed lab and imaging findings as well as pertinent plan -she recommends keppra  250 mg bid and observe.  She can stay here unless she gets worse.  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 Keturah (triad) for admission.   Problem List / ED Course:  AMS:  Pt does not meed code stroke criteria.  She had a possible seizure.  Pt started on keppra  250 bid.  Neurology recommends mri or repeat ct in 24 hours if unable to get MRI.   Visual Acuity  Right Eye Distance: 20/20 Left Eye Distance: 20/20 Bilateral Distance: 20/20  Right Eye Near: R Near: 20/10 Left Eye Near:  L Near: 20/10 Bilateral Near:  20/10    Reevaluation:  After the interventions noted above, I reevaluated the patient and found that they have :improved   Social Determinants of Health:  Lives at home   Dispostion:  After consideration of the diagnostic results and the patients response to treatment, I feel that the patent would benefit from admission.       Final diagnoses:  Altered mental status, unspecified altered mental status type    ED Discharge Orders     None          Dean Clarity, MD 10/20/23 2224

## 2023-10-20 NOTE — ED Notes (Signed)
 AC called to bring pt's Semglee  to the floor

## 2023-10-20 NOTE — ED Notes (Signed)
 Pt is tearful at this time. Family is at bedside talking with pt. Pt denies pain. Family reports pt stated they were uncomfortable. Pt was repositioned in bed with family assistance.

## 2023-10-21 ENCOUNTER — Inpatient Hospital Stay (HOSPITAL_COMMUNITY)

## 2023-10-21 DIAGNOSIS — G9340 Encephalopathy, unspecified: Secondary | ICD-10-CM | POA: Diagnosis not present

## 2023-10-21 DIAGNOSIS — R569 Unspecified convulsions: Secondary | ICD-10-CM | POA: Diagnosis not present

## 2023-10-21 LAB — VITAMIN B12: Vitamin B-12: 717 pg/mL (ref 180–914)

## 2023-10-21 LAB — BLOOD GAS, VENOUS
Acid-base deficit: 0.9 mmol/L (ref 0.0–2.0)
Bicarbonate: 21.3 mmol/L (ref 20.0–28.0)
Drawn by: 7049
O2 Saturation: 98.2 %
Patient temperature: 36.9
pCO2, Ven: 28 mmHg — ABNORMAL LOW (ref 44–60)
pH, Ven: 7.49 — ABNORMAL HIGH (ref 7.25–7.43)
pO2, Ven: 119 mmHg — ABNORMAL HIGH (ref 32–45)

## 2023-10-21 LAB — BASIC METABOLIC PANEL WITH GFR
Anion gap: 7 (ref 5–15)
BUN: 28 mg/dL — ABNORMAL HIGH (ref 8–23)
CO2: 21 mmol/L — ABNORMAL LOW (ref 22–32)
Calcium: 8.5 mg/dL — ABNORMAL LOW (ref 8.9–10.3)
Chloride: 110 mmol/L (ref 98–111)
Creatinine, Ser: 1.59 mg/dL — ABNORMAL HIGH (ref 0.44–1.00)
GFR, Estimated: 33 mL/min — ABNORMAL LOW (ref >60)
Glucose, Bld: 82 mg/dL (ref 70–99)
Potassium: 4.1 mmol/L (ref 3.5–5.1)
Sodium: 138 mmol/L (ref 135–145)

## 2023-10-21 LAB — CBC
HCT: 29.8 % — ABNORMAL LOW (ref 36.0–46.0)
Hemoglobin: 9.5 g/dL — ABNORMAL LOW (ref 12.0–15.0)
MCH: 26.5 pg (ref 26.0–34.0)
MCHC: 31.9 g/dL (ref 30.0–36.0)
MCV: 83.2 fL (ref 80.0–100.0)
Platelets: 258 10*3/uL (ref 150–400)
RBC: 3.58 MIL/uL — ABNORMAL LOW (ref 3.87–5.11)
RDW: 14.7 % (ref 11.5–15.5)
WBC: 8.5 10*3/uL (ref 4.0–10.5)
nRBC: 0 % (ref 0.0–0.2)

## 2023-10-21 LAB — GLUCOSE, CAPILLARY
Glucose-Capillary: 76 mg/dL (ref 70–99)
Glucose-Capillary: 81 mg/dL (ref 70–99)
Glucose-Capillary: 119 mg/dL — ABNORMAL HIGH (ref 70–99)
Glucose-Capillary: 164 mg/dL — ABNORMAL HIGH (ref 70–99)

## 2023-10-21 LAB — PHOSPHORUS: Phosphorus: 3.8 mg/dL (ref 2.5–4.6)

## 2023-10-21 LAB — MAGNESIUM: Magnesium: 2 mg/dL (ref 1.7–2.4)

## 2023-10-21 LAB — AMMONIA: Ammonia: 33 umol/L (ref 9–35)

## 2023-10-21 MED ORDER — ASPIRIN 325 MG PO TABS
325.0000 mg | ORAL_TABLET | Freq: Every day | ORAL | Status: DC
  Administered 2023-10-21 – 2023-10-26 (×6): 325 mg via ORAL
  Filled 2023-10-21 (×6): qty 1

## 2023-10-21 MED ORDER — INSULIN GLARGINE-YFGN 100 UNIT/ML ~~LOC~~ SOLN
18.0000 [IU] | Freq: Every day | SUBCUTANEOUS | Status: DC
  Administered 2023-10-21 – 2023-10-25 (×5): 18 [IU] via SUBCUTANEOUS
  Filled 2023-10-21 (×7): qty 0.18

## 2023-10-21 NOTE — Progress Notes (Signed)
 Patient refused CPAP.

## 2023-10-21 NOTE — Hospital Course (Signed)
 78 y.o. 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 They note at baseline since her prior stroke that she has memory deficits, but normally is oriented, conversant, independent with ADLs.  Has baseline right-sided hemiparesis.  Today family were all sitting together and around 3:45 PM they noted that patient was not interacting with them normally.  They realize that she was disoriented, did not recognize family members, did not know where she was, did not recall her birthdate.  Needing more assistance around the house.  She seems to be squinting and looking around abnormally, possible eyelid twitching bilaterally.  Later her husband noted a twisting of her face describes her jaw pulling to the left side.  Reports similar symptoms with stroke in the past.  Episode was transient.  She maintained consciousness.  No additional abnormal movements.  No known history of seizure disorder.  No other recent illness.  No falls or head injury. Reports that think she has had prior MRI scan after her aneurysm clip; however no records available in system.  Pt was admitted for further management and observation.   Neurology was consulted.  Metabolic work up has been negative.  20 min EEG was neg for seizure.  Repeat CT brain was neg.  Mental status slowly improving, but pt not back to baseline.  As a result, neurology requested transfer to [facility] for LTM.

## 2023-10-21 NOTE — Progress Notes (Signed)
   10/21/23 1608  TOC Brief Assessment  Insurance and Status Reviewed  Patient has primary care physician Yes  Home environment has been reviewed From home c/husband  Prior level of function: Min assist  Prior/Current Home Services No current home services  Social Drivers of Health Review SDOH reviewed no interventions necessary  Readmission risk has been reviewed Yes  Transition of care needs no transition of care needs at this time   Transition of Care Department [facility]) has reviewed patient and no TOC needs have been identified at this time. We will continue to monitor patient advancement through interdisciplinary progression rounds. If new patient transition needs arise, please place a TOC consult.

## 2023-10-21 NOTE — H&P (Addendum)
 History and Physical    Jessica  ZIPPORAH Strong FMW:[id number] DOB: February 23, 1946 DOA: 10/20/2023  PCP: Strong, Gloria, FNP   Patient coming from: Home   Chief Complaint:  Chief Complaint  Patient presents with   Altered Mental Status    HPI: History limited due to patients confusion. Provided by family at bedside  Jessica  SHAWNI Strong is a 78 y.o. 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 They note at baseline since her prior stroke that she has memory deficits, but normally is oriented, conversant, independent with ADLs.  Has baseline right-sided hemiparesis.  Today family were all sitting together and around 3:45 PM they noted that patient was not interacting with them normally.  They realize that she was disoriented, did not recognize family members, did not know where she was, did not recall her birthdate.  Needing more assistance around the house.  She seems to be squinting and looking around abnormally, possible eyelid twitching bilaterally.  Later her husband noted a twisting of her face describes her jaw pulling to the left side.  Reports similar symptoms with stroke in the past.  Episode was transient.  She maintained consciousness.  No additional abnormal movements.  No known history of seizure disorder.  No other recent illness.  No falls or head injury. Reports that think she has had prior MRI scan after her aneurysm clip; however no records available in system. They will try to locate manufacturer infor.    Review of Systems:  + chronic DOE and LE swelling.  ROS complete and negative except as marked above   Allergies  Allergen Reactions   Corn Oil Rash   Other Rash    PT STATES SHE IS ALLERGIC TO MOST FOODS-STATES IT WAS FOUND IN ALLERGY TESTING   Peanut Allergen Powder-Dnfp Rash   Peanut-Containing Drug Products Rash    Penicillins Rash    Has patient had a PCN reaction causing immediate rash, facial/tongue/throat swelling, SOB or lightheadedness with hypotension: Yes Has patient had a PCN reaction causing severe rash involving mucus membranes or skin necrosis: No Has patient had a PCN reaction that required hospitalization No Has patient had a PCN reaction occurring within the last 10 years: No. Has not had in 40-50 years If all of the above answers are NO, then may proceed with Cephalosporin use.    Prior to Admission medications   Medication Sig Start Date End Date Taking? Authorizing Provider  ACCU-CHEK AVIVA PLUS test strip Inject 1 strip as directed daily. 10/15/15   [provider]  acetaminophen  (TYLENOL ) 500 MG tablet Take 500 mg by mouth every 6 (six) hours as needed for mild pain or headache. As needed.    [provider]  albuterol  (VENTOLIN  HFA) 108 (90 Base) MCG/ACT inhaler Inhale 2 puffs into the lungs every 6 (six) hours as needed for wheezing or shortness of breath. 05/24/22   Strong, Gloria, FNP  aspirin  325 MG tablet Take 325 mg by mouth daily.    [provider]  bisacodyl (DULCOLAX) 5 MG EC tablet Take 5 mg by mouth. One every other day    [provider]  calcitRIOL (ROCALTROL) 0.25 MCG capsule Take 0.25 mcg by mouth daily. 10/08/23   [provider]  Calcium  Carb-Cholecalciferol (CALCIUM  600-D PO) Take 1 tablet by mouth daily.    [provider]  Continuous Glucose Sensor (DEXCOM  G7 SENSOR) MISC by Does not apply route.    [provider]  EPINEPHrine 0.3 mg/0.3 mL IJ SOAJ injection Inject 0.3 mg into the muscle as needed for anaphylaxis.    [provider]  ferrous sulfate 325 (65 FE) MG tablet Take 325 mg by mouth daily with breakfast.    [provider]  furosemide  (LASIX ) 20 MG tablet TAKE 1 TABLET BY MOUTH DAILY.  (MAY TAKE EXTRA 1 TABLET AS  NEEDED FOR SEVERE SWELLING) 06/04/23   Jessica Dorn FALCON, MD   Lancets Thin MISC Inject 1 Stick into the skin daily. 01/27/10   [provider]  levothyroxine  (SYNTHROID ) 88 MCG tablet Take 1 tablet (88 mcg total) by mouth daily before breakfast. 03/13/23   Jessica Benton PARAS, NP  Multiple Vitamin (QUINTABS) TABS Take 1 tablet by mouth daily. 12/26/17   [provider]  Multiple Vitamins-Minerals (MULTIVITAMIN ADULT) CHEW Chew 1 tablet by mouth daily at 12 noon.    [provider]  NOVOFINE PEN NEEDLE 32G X 6 MM MISC Use to inject insulin  4 times daily 11/27/22   Strong, Gloria, FNP  olmesartan  (BENICAR ) 20 MG tablet TAKE 1 TABLET BY MOUTH DAILY 08/14/23   Strong, Gloria, FNP  omeprazole  (PRILOSEC) 40 MG capsule Take 1 capsule (40 mg total) by mouth daily. 12/13/22   Jessica Angelia Sieving, MD  potassium chloride  (KLOR-CON  M) 10 MEQ tablet TAKE 1 TABLET BY MOUTH DAILY Patient taking differently: Take 10 mEq by mouth daily. Patient is taking 5 mg daily 06/27/23   Strong, Gloria, FNP  simvastatin  (ZOCOR ) 20 MG tablet Take 1 tablet (20 mg total) by mouth at bedtime. 03/13/23   Jessica Benton PARAS, NP  terbinafine  (LAMISIL  AT) 1 % cream Apply 1 Application topically 2 (two) times daily. Patient not taking: Reported on 10/18/2023 06/11/23   Strong, Gloria, FNP  tirzepatide  (MOUNJARO ) 15 MG/0.5ML Pen Inject 15 mg into the skin once a week. 03/13/23   Jessica Benton PARAS, NP  TRESIBA  FLEXTOUCH 200 UNIT/ML FlexTouch Pen Inject 66 Units into the skin daily at 10 pm. 07/10/23   Jessica Benton PARAS, NP  Vibegron  (GEMTESA ) 75 MG TABS Take 1 tablet (75 mg total) by mouth daily. 07/02/23   Jessica Cough, MD    Past Medical History:  Diagnosis Date   Allergy    Anemia    Anxiety    Arthritis    Asthma    Cataract    cervical ca 1990   Depression    Diabetes (HCC)    Dyspnea    Hypercholesterolemia    Hypertension    Hypothyroidism    Myocardial infarction (HCC) 2005   Sleep apnea    on CPAP 14 mm PS since February 2017   Stroke  [facility]) 2007   weakness on R side   Vitamin D  deficiency     Past Surgical History:  Procedure Laterality Date   ABDOMINAL HYSTERECTOMY     BARIATRIC SURGERY     BIOPSY  09/17/2020   Procedure: BIOPSY;  Surgeon: Jessica Angelia Sieving, MD;  [facility]: [facility];  Service: Gastroenterology;;   BIOPSY  12/08/2022   Procedure: BIOPSY;  Surgeon: Jessica Angelia Sieving, MD;  Location: [facility];  Service: Gastroenterology;;   BREAST BIOPSY Right    beningn   CARDIAC CATHETERIZATION N/A 12/24/2015   Procedure: Right/Left Heart Cath and Coronary Angiography;  Surgeon: Candyce GORMAN Reek, MD;  Location: [facility] INVASIVE CV LAB;  Service: Cardiovascular;  Laterality: N/A;   COLONOSCOPY  2018   COLONOSCOPY WITH PROPOFOL  N/A 09/17/2020   Procedure: COLONOSCOPY WITH PROPOFOL ;  Surgeon: Jessica Angelia Sieving, MD;  Location: [facility];  Service: Gastroenterology;  Laterality: N/A;  9:00   ESOPHAGOGASTRODUODENOSCOPY (EGD) WITH PROPOFOL  N/A 09/17/2020   Procedure: ESOPHAGOGASTRODUODENOSCOPY (EGD) WITH PROPOFOL ;  Surgeon: Jessica Angelia Sieving, MD;  [facility]: [facility];  Service: Gastroenterology;  Laterality: N/A;   ESOPHAGOGASTRODUODENOSCOPY (EGD) WITH PROPOFOL  N/A 12/08/2022   Procedure: ESOPHAGOGASTRODUODENOSCOPY (EGD) WITH PROPOFOL ;  Surgeon: Jessica Angelia Sieving, MD;  Location: [facility];  Service: Gastroenterology;  Laterality: N/A;  2:30PM;ASA 3   HERNIA REPAIR     LEFT HEART CATH AND CORONARY ANGIOGRAPHY N/A 07/12/2017   Procedure: LEFT HEART CATH AND CORONARY ANGIOGRAPHY;  Surgeon: Claudene Victory ORN, MD;  Location: [facility] INVASIVE CV LAB;  Service: Cardiovascular;  Laterality: N/A;     reports that she has never smoked. She has never been exposed to tobacco smoke. She has never used smokeless tobacco. She reports that she does not currently use alcohol . She reports that she does not use drugs.  Family History  Problem Relation Age of Onset   Cancer Mother    Colon  cancer Mother    Diabetes Mother    Hypertension Father    Thyroid  disease Father    Thyroid  cancer Father    Diabetes Father    Esophageal cancer Father    Breast cancer Maternal Aunt    Colon cancer Maternal Aunt    Breast cancer Maternal Aunt    Colon cancer Maternal Grandmother    Stroke Maternal Grandfather    Stomach cancer Paternal Grandfather    Colon cancer Son    Colon polyps Neg Hx    Rectal cancer Neg Hx      Physical Exam: Vitals:   10/20/23 2200 10/20/23 2230 10/20/23 2300 10/20/23 2335  BP: (!) 121/59 133/74 123/64 (!) 153/73  Pulse: 64 65 63 65  Resp: (!) 21 17 (!) 23 (!) 24  Temp:    98 F (36.7 C)  TempSrc:    Oral  SpO2: 96% 98% 96% 95%  Weight:      Height:        Gen: Awake, alert, chronically ill-appearing HEENT: No tongue bite wound CV: Regular, normal S1, S2, no murmurs  Resp: Normal WOB, CTAB  Abd: Obese, normoactive, nontender MSK: Symmetric, there is trace LE edema  Skin: No rashes or lesions to exposed skin  Neuro: Alert and interactive, oriented to self, [facility] (not independent), not to time or situation, memory impaired to recent event.  No aphasia or dysarthria.  CN II through XII are intact.  Motor is 4 out of 5 and symmetric in the upper and lower extremities.  Sensation is intact and equal to fine touch. Psych: Affect is sad, appropriate    Data review:   Labs reviewed, notable for:   Chemistries and blood counts unremarkable WBC 8 TSH within normal limit UA not consistent with infection  616 LDL 67, A1c 8.2%  Micro:  Results for orders placed or performed in visit on 10/03/21  Microscopic Examination     Status: None   Collection Time: 10/03/21  2:21 PM   Urine  Result Value Ref Range Status   WBC, UA 0-5 0 - 5 /hpf Final   RBC, Urine 0-2 0 - 2 /hpf Final   Epithelial Cells (non renal) 0-10 0 - 10 /hpf Final   Renal Epithel, UA None seen None seen /hpf Final  Bacteria, UA Few None seen/Few Final     Imaging reviewed:  DG Chest 1 View Result Date: 10/20/2023 CLINICAL DATA:  Altered mental status EXAM: CHEST  1 VIEW COMPARISON:  Chest x-ray 08/10/2023 FINDINGS: The heart size and mediastinal contours are within normal limits. Both lungs are clear. The visualized skeletal structures are unremarkable. IMPRESSION: No active disease. Electronically Signed   By: Greig Pique M.D.   On: 10/20/2023 20:28   CT Head Wo Contrast Result Date: 10/20/2023 CLINICAL DATA:  Altered mental status EXAM: CT HEAD WITHOUT CONTRAST TECHNIQUE: Contiguous axial images were obtained from the base of the skull through the vertex without intravenous contrast. RADIATION DOSE REDUCTION: This exam was performed according to the departmental dose-optimization program which includes automated exposure control, adjustment of the mA and/or kV according to patient size and/or use of iterative reconstruction technique. COMPARISON:  None Available. FINDINGS: Brain: Left frontal and temporal lobe encephalomalacia. Old bilateral cerebellar infarcts and old anterior left thalamic infarct. No mass, hemorrhage or extra-axial collection. 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 Skull: Remote left pterional craniotomy. Sinuses/Orbits: No fluid levels or advanced mucosal thickening of the visualized paranasal sinuses. No mastoid or middle ear effusion. Normal orbits. Other: None. IMPRESSION: 1. No acute intracranial abnormality. 2. Left frontal and temporal lobe encephalomalacia. 3. Old bilateral cerebellar infarcts and old anterior left thalamic infarct. Electronically Signed   By: Franky Stanford M.D.   On: 10/20/2023 20:18    EKG:  Personally reviewed, sinus rhythm, borderline first-degree AV block, PRWP, no acute ischemic changes  ED Course:  EDP consulted with neurology Dr. Clayburn re: presentation, recommends starting on Keppra  250 mg twice daily, if unable to get MRI get CT head at 24 hours.  If not returning to  baseline re: consultation with neurology possible transfer to [facility].  For now okay for observation at [facility].   Assessment/Plan:  78 y.o. 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   Encephalopathy, disorientation, acute onset.  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  LKWT 6/21 ~ 1545. Acute AMS with disorientation. + twitching of eyelids bilateral, and transient twisting of face. On my evaluation family reports improving mental status. She is A+O x self, [place] [facility], and recognizes family. Not oriented to time or situation. Memory impaired to short term. No focal deficits noted, 4/5 symmetric strength.  Lab evaluation unrevealing for underlying cause of encephalopathy or trigger for seizure.  CT head negative for acute finding, does show left frontal and temporal encephalomalacia as well as old bilateral cerebellar and left anterior thalamic strokes.  - EDP consulted with neurology Dr. Clayburn re: presentation, recommends starting on Keppra  250 mg twice daily, if unable to get MRI get CT head at 24 hours.  If not returning to baseline re: consultation with neurology possible transfer to [facility].  For now okay for observation at [facility]. -Tentatively have ordered CT head for 6/22 at 2200; asked family to try to locate manufacturing info for aneurysm clip if they are able. - Seizure precautions, neurochecks  - Swallow screen then clear liquid diet for now - Check B12, VBG, ammonia.  - PT/OT/SLP ordered -Telemonitoring - Needs additional instruction re: activity restriction/driving restriction in setting  of possible seizures  Chronic  medical problems: History of prior CVA: 6/16 LDL 67, A1c 8.2%. continue home aspirin  325 mg daily. Continue home simvastatin   Prior MI: Antiplatelet/statin per above Remote aneurysm clip '07: noted, see above unclear if MRI safe  Diabetes type 2: Home insulin  regimen is Tresiba  66 units nightly, also on Mounjaro .  With encephalopathy anticipate decreased oral intake.  Conservative reduction and transition to Semglee  28 units nightly, SSI for moderate for now, titrate as needed. Hypertension: Hold home olmesartan  for permissive hypertension in case symptoms represents stroke CKD stage III: Baseline creatinine near 1.8 Morbid obesity: Would benefit from weight loss outpatient History of Roux-en-Y bypass: Noted OSA: CPAP at bedtime  Mood disorder: Noted not currently on medication  Body mass index is 52.44 kg/m.     DVT prophylaxis:  SCDs Code Status:  Full Code Diet:  Diet Orders (From admission, onward)     Start     Ordered   10/21/23 0038  Diet clear liquid Room service appropriate? Yes; Fluid consistency: Thin  Diet effective now       Question Answer Comment  Room service appropriate? Yes   Fluid consistency: Thin      10/21/23 0037           Family Communication:  Yes discussed with husband and daughter at bedside Consults: Neurology Admission status:   Inpatient, Telemetry bed  Severity of Illness: The appropriate patient status for this patient is INPATIENT. Inpatient status is judged to be reasonable and necessary in order to provide the required intensity of service to ensure the patient's safety. The patient's presenting symptoms, physical exam findings, and initial radiographic and laboratory data in the context of their chronic comorbidities is felt to place them at high risk for further clinical deterioration. Furthermore, it is not anticipated that the patient will be medically stable for discharge from the hospital within 2 midnights of admission.   * I certify  that at the point of admission it is my clinical judgment that the patient will require inpatient hospital care spanning beyond 2 midnights from the point of admission due to high intensity of service, high risk for further deterioration and high frequency of surveillance required.*   Dorn Dawson, MD [facility]  How to contact the [facility] Attending or Consulting provider 7A - 7P or covering provider during after hours 7P -7A, for this patient.  Check the care team in [facility] and look for a) attending/consulting [facility] provider listed and b) the [facility] team listed Log into [email or web address] and use [facility] universal password to access. If you do not have the password, please contact the hospital operator. Locate the [facility] provider you are looking for under [facility] and page to a number that you can be directly reached. If you still have difficulty reaching the provider, please page the [facility] (Director on Call) for the Hospitalists listed on amion for assistance.  10/21/2023, 12:40 AM

## 2023-10-21 NOTE — Progress Notes (Signed)
 PROGRESS NOTE   Jessica  BRYTTANY Strong  FMW:[id number] DOB: 1946-01-25 DOA: 10/20/2023 PCP: Zarwolo, Gloria, FNP   Chief Complaint  Patient presents with   Altered Mental Status   Level of care: Telemetry  Brief Admission History:  78 y.o. 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 They note at baseline since her prior stroke that she has memory deficits, but normally is oriented, conversant, independent with ADLs.  Has baseline right-sided hemiparesis.  Today family were all sitting together and around 3:45 PM they noted that patient was not interacting with them normally.  They realize that she was disoriented, did not recognize family members, did not know where she was, did not recall her birthdate.  Needing more assistance around the house.  She seems to be squinting and looking around abnormally, possible eyelid twitching bilaterally.  Later her husband noted a twisting of her face describes her jaw pulling to the left side.  Reports similar symptoms with stroke in the past.  Episode was transient.  She maintained consciousness.  No additional abnormal movements.  No known history of seizure disorder.  No other recent illness.  No falls or head injury. Reports that think she has had prior MRI scan after her aneurysm clip; however no records available in system.  Pt was admitted for further management and observation.    Assessment and Plan:  Encephalopathy, disorientation, acute onset.  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  LKWT 6/21 ~ 1545. Acute AMS with disorientation. + twitching of eyelids bilateral, and transient twisting of face. On my evaluation family reports improving mental status. She is A+O x self, [place] [facility], and recognizes  family. Not oriented to time or situation. Memory impaired to short term. No focal deficits noted, 4/5 symmetric strength.  Lab evaluation unrevealing for underlying cause of encephalopathy or trigger for seizure.  CT head negative for acute finding, does show left frontal and temporal encephalomalacia as well as old bilateral cerebellar and left anterior thalamic strokes.  - EDP consulted with neurology Dr. Clayburn re: presentation, recommends starting on Keppra  250 mg twice daily, if unable to get MRI get CT head at 24 hours.  If not returning to baseline re: consultation with neurology possible transfer to [facility].  For now okay for observation at [facility]. -Tentatively have ordered CT head for 6/22 at 2200; asked family to try to locate manufacturing info for aneurysm clip if they are able. - Seizure precautions, neurochecks  - Swallow screen then clear liquid diet for now - Check B12, VBG, ammonia.  - PT/OT/SLP ordered -Telemonitoring - Needs additional instruction re: activity restriction/driving restriction in setting of possible seizures   History of prior CVA: 6/16 LDL 67, A1c 8.2%. continue home aspirin  325 mg daily. Continue home simvastatin    Prior MI: Antiplatelet/statin per above  Remote aneurysm clip '07: noted, see above unclear if MRI safe   Diabetes type 2: Home insulin  regimen is Tresiba  66 units nightly, also on Mounjaro .  With encephalopathy anticipate decreased oral intake.  Conservative reduction and transition to Semglee  28 units nightly, SSI for moderate for now, titrate as needed.  Hypertension: Hold home olmesartan  for permissive hypertension in case symptoms represents stroke  CKD stage III: Baseline creatinine near 1.8 Morbid obesity: Would benefit from weight loss outpatient History of Roux-en-Y bypass:  Noted OSA: CPAP at bedtime  Mood disorder: Noted not currently on medication   DVT prophylaxis: SCDs Code Status: FULL  Family Communication: husband  bedside update 6/22 Disposition: Status is: Inpatient   Consultants:   Procedures:   Antimicrobials:    Subjective: Pt reports that she doesn't feel back to her baseline but symptoms are not worse and she has not had any obvious seizure activity.    Objective: Vitals:   10/20/23 2335 10/21/23 0412 10/21/23 0730 10/21/23 1123  BP: (!) 153/73 133/65 (!) 122/58 137/68  Pulse: 65 70 63 65  Resp: (!) 24 18 17 18   Temp: 98 F (36.7 C) 98.5 F (36.9 C) 98.2 F (36.8 C)   TempSrc: Oral Oral Oral   SpO2: 95% 94% 92% 96%  Weight: 121.4 kg     Height: 5' (1.524 m)       Intake/Output Summary (Last 24 hours) at 10/21/2023 1618 Last data filed at 10/21/2023 0900 Gross per 24 hour  Intake 720 ml  Output --  Net 720 ml   Filed Weights   10/20/23 1944 10/20/23 2335  Weight: 121.8 kg 121.4 kg   Examination:  General exam: Appears calm and comfortable  Respiratory system: Clear to auscultation. Respiratory effort normal. Cardiovascular system: normal S1 & S2 heard. No JVD, murmurs, rubs, gallops or clicks. No pedal edema. Gastrointestinal system: Abdomen is nondistended, soft and nontender. No organomegaly or masses felt. Normal bowel sounds heard. Central nervous system: Alert and oriented. No focal neurological deficits. Extremities: Symmetric 5 x 5 power. Skin: No rashes, lesions or ulcers. Psychiatry: Judgement and insight appear normal. Mood & affect appropriate.   Data Reviewed: I have personally reviewed following labs and imaging studies  CBC: Recent Labs  Lab 10/15/23 1102 10/20/23 2047 10/21/23 0438  WBC 8.9 8.6 8.5  NEUTROABS 5.4 5.0  --   HGB 10.7* 10.3* 9.5*  HCT 36.0 33.0* 29.8*  MCV 85 84.2 83.2  PLT 282 271 258    Basic Metabolic Panel: Recent Labs  Lab 10/15/23 1102 10/20/23 2047 10/21/23 0438  NA 141 142 138  K 5.2 4.4 4.1  CL 106 107 110  CO2 17* 23 21*  GLUCOSE 150* 99 82  BUN 29* 30* 28*  CREATININE 1.89* 1.84* 1.59*  CALCIUM  9.3 9.1  8.5*  MG  --   --  2.0  PHOS  --   --  3.8    CBG: Recent Labs  Lab 10/20/23 1945 10/20/23 2339 10/21/23 0758 10/21/23 1106 10/21/23 1556  GLUCAP 106* 94 76 81 119*    No results found for this or any previous visit (from the past 240 hours).   Radiology Studies: DG Chest 1 View Result Date: 10/20/2023 CLINICAL DATA:  Altered mental status EXAM: CHEST  1 VIEW COMPARISON:  Chest x-ray 08/10/2023 FINDINGS: The heart size and mediastinal contours are within normal limits. Both lungs are clear. The visualized skeletal structures are unremarkable. IMPRESSION: No active disease. Electronically Signed   By: Greig Pique M.D.   On: 10/20/2023 20:28   CT Head Wo Contrast Result Date: 10/20/2023 CLINICAL DATA:  Altered mental status EXAM: CT HEAD WITHOUT CONTRAST TECHNIQUE: Contiguous axial images were obtained from the base of the skull through the vertex without intravenous contrast. RADIATION DOSE REDUCTION: This exam was performed according to the departmental dose-optimization program which includes automated exposure control, adjustment of the mA and/or kV according to patient size and/or use of iterative reconstruction technique. COMPARISON:  None Available. FINDINGS: Brain: Left  frontal and temporal lobe encephalomalacia. Old bilateral cerebellar infarcts and old anterior left thalamic infarct. No mass, hemorrhage or extra-axial collection. 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 Skull: Remote left pterional craniotomy. Sinuses/Orbits: No fluid levels or advanced mucosal thickening of the visualized paranasal sinuses. No mastoid or middle ear effusion. Normal orbits. Other: None. IMPRESSION: 1. No acute intracranial abnormality. 2. Left frontal and temporal lobe encephalomalacia. 3. Old bilateral cerebellar infarcts and old anterior left thalamic infarct. Electronically Signed   By: Franky Stanford M.D.   On: 10/20/2023 20:18    Scheduled Meds:  aspirin   325 mg Oral Daily    insulin  aspart  0-15 Units Subcutaneous TID WC   insulin  glargine-yfgn  18 Units Subcutaneous QHS   levETIRAcetam   250 mg Oral BID   levothyroxine   88 mcg Oral Q0600   pantoprazole   40 mg Oral Daily   simvastatin   20 mg Oral QHS   sodium chloride  flush  3 mL Intravenous Q12H   Continuous Infusions:   LOS: 1 day   Time spent: 38 mins  Jessica Dudenhoeffer Vicci, MD How to contact the [facility] Attending or Consulting provider 7A - 7P or covering provider during after hours 7P -7A, for this patient?  Check the care team in [facility] and look for a) attending/consulting [facility] provider listed and b) the TR[facility] team listed Log into [email or web address] to find provider on call.  Locate the [facility] provider you are looking for under [facility] and page to a number that you can be directly reached. If you still have difficulty reaching the provider, please page the [facility] (Director on Call) for the Hospitalists listed on amion for assistance.  10/21/2023, 4:18 PM

## 2023-10-22 ENCOUNTER — Encounter (HOSPITAL_COMMUNITY): Payer: Self-pay | Admitting: Internal Medicine

## 2023-10-22 DIAGNOSIS — G9340 Encephalopathy, unspecified: Secondary | ICD-10-CM | POA: Diagnosis not present

## 2023-10-22 DIAGNOSIS — R4189 Other symptoms and signs involving cognitive functions and awareness: Secondary | ICD-10-CM

## 2023-10-22 DIAGNOSIS — R569 Unspecified convulsions: Secondary | ICD-10-CM | POA: Diagnosis not present

## 2023-10-22 LAB — GLUCOSE, CAPILLARY
Glucose-Capillary: 124 mg/dL — ABNORMAL HIGH (ref 70–99)
Glucose-Capillary: 111 mg/dL — ABNORMAL HIGH (ref 70–99)
Glucose-Capillary: 183 mg/dL — ABNORMAL HIGH (ref 70–99)
Glucose-Capillary: 124 mg/dL — ABNORMAL HIGH (ref 70–99)
Glucose-Capillary: 211 mg/dL — ABNORMAL HIGH (ref 70–99)

## 2023-10-22 NOTE — Consult Note (Signed)
 I connected with  Jessica  ADALEENA Strong on 10/22/23 by a video enabled telemedicine application and verified that I am speaking with the correct person using two identifiers.   I discussed the limitations of evaluation and management by telemedicine. The patient expressed understanding and agreed to proceed.  Location of patient: [facility] Location physician: [facility]  Neurology Consultation Reason for Consult: Seizure Referring Physician: Dr. Mliss Boyers  CC: Altered mental status, twitching  History is obtained from: Patient, chart review  HPI: Jessica  [name] is a 78 y.o. 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 Per husband at bedside, patient was eating with family at around 1530 on 10/20/2023 and suddenly wasn't able to answer simple questions and couldn't recognize family. There is documentation of twitching of face in ED notes and H&PP but husband denies that now. Denies stiffening, jerking, incontinence, gaze deviation. Denies h/o seizure. Denies recent medication changes, illness. States at baseline patient is able to manage all basic ADLs. Since being in the hospital per husband her mentation has improved but not back to baseline.   ROS: All other systems reviewed and negative except as noted in the HPI.   Past Medical History:  Diagnosis Date   Allergy    Anemia    Anxiety    Arthritis    Asthma    Cataract    cervical ca 1990   Depression    Diabetes (HCC)    Dyspnea    Hypercholesterolemia    Hypertension    Hypothyroidism    Myocardial infarction (HCC) 2005   Sleep apnea    on CPAP 14 mm PS since February 2017   Stroke [facility]) 2007   weakness on R side   Vitamin D  deficiency     Family History  Problem Relation Age of Onset   Cancer  Mother    Colon cancer Mother    Diabetes Mother    Hypertension Father    Thyroid  disease Father    Thyroid  cancer Father    Diabetes Father    Esophageal cancer Father    Breast cancer Maternal Aunt    Colon cancer Maternal Aunt    Breast cancer Maternal Aunt    Colon cancer Maternal Grandmother    Stroke Maternal Grandfather    Stomach cancer Paternal Grandfather    Colon cancer Son    Colon polyps Neg Hx    Rectal cancer Neg Hx     Social History:  reports that she has never smoked. She has never been exposed to tobacco smoke. She has never used smokeless tobacco. She reports that she does not currently use alcohol . She reports that she does not use drugs.   Medications Prior to Admission  Medication Sig Dispense Refill Last Dose/Taking   acetaminophen  (TYLENOL ) 500 MG tablet Take 500 mg by mouth every 6 (six) hours as needed for mild pain or headache. As needed.   Unknown   albuterol  (VENTOLIN  HFA) 108 (90 Base) MCG/ACT inhaler Inhale 2 puffs into the lungs every 6 (six) hours as needed for wheezing or shortness of breath. 8 g 2 Unknown   calcitRIOL (ROCALTROL) 0.25 MCG capsule Take 0.25 mcg by mouth daily.   10/19/2023   EPINEPHrine 0.3 mg/0.3 mL IJ SOAJ injection Inject 0.3 mg into the muscle as needed for anaphylaxis.   Unknown  furosemide  (LASIX ) 20 MG tablet TAKE 1 TABLET BY MOUTH DAILY.  (MAY TAKE EXTRA 1 TABLET AS  NEEDED FOR SEVERE SWELLING) 200 tablet 2 10/19/2023   levothyroxine  (SYNTHROID ) 88 MCG tablet Take 1 tablet (88 mcg total) by mouth daily before breakfast. 90 tablet 3 10/19/2023   Multiple Vitamins-Minerals (MULTIVITAMIN ADULT) CHEW Chew 1 tablet by mouth daily.   10/19/2023   olmesartan  (BENICAR ) 20 MG tablet TAKE 1 TABLET BY MOUTH DAILY 100 tablet 2 10/19/2023   omeprazole  (PRILOSEC) 40 MG capsule Take 1 capsule (40 mg total) by mouth daily. (Patient taking differently: Take 40 mg by mouth daily as needed.) 90 capsule 3 Unknown   potassium chloride  (KLOR-CON  M)  10 MEQ tablet TAKE 1 TABLET BY MOUTH DAILY (Patient taking differently: Take 5 mEq by mouth daily.) 100 tablet 2 10/19/2023   simvastatin  (ZOCOR ) 20 MG tablet Take 1 tablet (20 mg total) by mouth at bedtime. 90 tablet 3 10/19/2023   tirzepatide  (MOUNJARO ) 15 MG/0.5ML Pen Inject 15 mg into the skin once a week. (Patient taking differently: Inject 15 mg into the skin every Friday.) 6 mL 3 10/19/2023   TRESIBA  FLEXTOUCH 200 UNIT/ML FlexTouch Pen Inject 66 Units into the skin daily at 10 pm. (Patient taking differently: Inject 60 Units into the skin at bedtime.) 45 mL 3 10/19/2023   Vibegron  (GEMTESA ) 75 MG TABS Take 1 tablet (75 mg total) by mouth daily. 30 tablet 11 10/19/2023   ACCU-CHEK AVIVA PLUS test strip Inject 1 strip as directed daily.  4    aspirin  325 MG tablet Take 325 mg by mouth daily. (Patient not taking: Reported on 10/21/2023)   Not Taking   bisacodyl (DULCOLAX) 5 MG EC tablet Take 5 mg by mouth. One every other day (Patient not taking: Reported on 10/21/2023)   Not Taking   Calcium  Carb-Cholecalciferol (CALCIUM  600-D PO) Take 1 tablet by mouth daily. (Patient not taking: Reported on 10/21/2023)   Not Taking   Continuous Glucose Sensor (DEXCOM G7 SENSOR) MISC by Does not apply route.      ferrous sulfate 325 (65 FE) MG tablet Take 325 mg by mouth daily with breakfast. (Patient not taking: Reported on 10/21/2023)   Not Taking   Lancets Thin MISC Inject 1 Stick into the skin daily.      NOVOFINE PEN NEEDLE 32G X 6 MM MISC Use to inject insulin  4 times daily 200 each 2    terbinafine  (LAMISIL  AT) 1 % cream Apply 1 Application topically 2 (two) times daily. (Patient not taking: No sig reported) 42 g 1 Not Taking      Exam: Current vital signs: BP (!) 113/55 (BP Location: Right Arm)   Pulse 68   Temp 98.3 F (36.8 C) (Oral)   Resp 18   Ht 5' (1.524 m)   Wt 121.4 kg   SpO2 93%   BMI 52.26 kg/m  Vital signs in last 24 hours: Temp:  [98.2 F (36.8 C)-98.3 F (36.8 C)] 98.3 F (36.8 C)  (06/23 0300) Pulse Rate:  [65-69] 68 (06/23 0300) Resp:  [18-20] 18 (06/23 0300) BP: (113-137)/(55-68) 113/55 (06/23 0300) SpO2:  [93 %-96 %] 93 % (06/23 0300)   Physical Exam  Constitutional: Appears well-developed and well-nourished.  Psych: Affect appropriate to situation Neuro: AOx2, not oriented to time, no aphasia, CN grossly intact, antigravity strength in all extremities without drift, sensory intact, FTN intact  I have reviewed labs in epic and the results pertinent to this consultation are: CBC:  Recent  Labs  Lab 10/15/23 1102 10/20/23 2047 10/21/23 0438  WBC 8.9 8.6 8.5  NEUTROABS 5.4 5.0  --   HGB 10.7* 10.3* 9.5*  HCT 36.0 33.0* 29.8*  MCV 85 84.2 83.2  PLT 282 271 258    Basic Metabolic Panel:  Lab Results  Component Value Date   NA 138 10/21/2023   K 4.1 10/21/2023   CO2 21 (L) 10/21/2023   GLUCOSE 82 10/21/2023   BUN 28 (H) 10/21/2023   CREATININE 1.59 (H) 10/21/2023   CALCIUM  8.5 (L) 10/21/2023   GFRNONAA 33 (L) 10/21/2023   GFRAA 48 (L) 07/09/2017   Lipid Panel:  Lab Results  Component Value Date   LDLCALC 67 10/15/2023   HgbA1c:  Lab Results  Component Value Date   HGBA1C 8.2 (H) 10/15/2023   Urine Drug Screen: No results found for: LABOPIA, COCAINSCRNUR, LABBENZ, AMPHETMU, THCU, LABBARB  Alcohol  Level No results found for: ETH   I have reviewed the images obtained:  CT head without contrast 10/21/2023: No acute intracranial abnormality. Unchanged appearance of left frontal and temporal encephalomalacia and multiple old cerebellar infarcts.  ASSESSMENT/PLAN:78yo F with transient ams which is gradually improving  Transient alteration of awareness - ddx include stroke vs seizure vs toxic-metabolic causes vs cognitive impairment  Recommendations: - MR brain wo contrast to look for acute abnormality if able to  - EEG to evaluate for epileptogenicity - Agree with Keppra  250mg  BID as patient has underlying cortical  abnormality and increased risk of seizure - If mentation doesn't improve, may need to transfer to [facility] for [facility] term eeg - check folate, thiamine, RPR although low suspicion - Discussed plan with husband at bedside and Dr Vicci via secure chat  Thank you for allowing us  to participate in the care of this patient. If you have any further questions, please contact  me or neurohospitalist.   Arlin Krebs Epilepsy [facility]

## 2023-10-22 NOTE — Plan of Care (Signed)

## 2023-10-22 NOTE — Evaluation (Signed)
 Physical Therapy Evaluation Patient Details Name: Jessica  TOSHIE Strong MRN: [id number] DOB: Mar 03, 1946 Today's Date: 10/22/2023  History of Present Illness  History limited due to patients confusion. Provided by family at bedside   Jessica  [name] is a 78 y.o. 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 They note at baseline since her prior stroke that she has memory deficits, but normally is oriented, conversant, independent with ADLs.  Has baseline right-sided hemiparesis.  Today family were all sitting together and around 3:45 PM they noted that patient was not interacting with them normally.  They realize that she was disoriented, did not recognize family members, did not know where she was, did not recall her birthdate.  Needing more assistance around the house.  She seems to be squinting and looking around abnormally, possible eyelid twitching bilaterally.  Later her husband noted a twisting of her face describes her jaw pulling to the left side.  Reports similar symptoms with stroke in the past.  Episode was transient.  She maintained consciousness.  No additional abnormal movements.  No known history of seizure disorder.  No other recent illness.  No falls or head injury. Reports that think she has had prior MRI scan after her aneurysm clip; however no records available in system. They will try to locate manufacturer infor.   Clinical Impression  Patient had difficulty sitting up at bedside from flat bed, once seated had good sitting balance and able to ambulate safely using her quad-cane for short distances, once fatigued had to frequently lean on side rails and wall in hallway. Patient tolerated sitting up in chair after therapy with her spouse present. Patient will benefit from continued skilled physical therapy in hospital and recommended venue  below to increase strength, balance, endurance for safe ADLs and gait.          If plan is discharge home, recommend the following: A little help with walking and/or transfers;Help with stairs or ramp for entrance;A little help with bathing/dressing/bathroom;Assistance with cooking/housework   Can travel by private vehicle        Equipment Recommendations None recommended by PT  Recommendations for Other Services       Functional Status Assessment Patient has had a recent decline in their functional status and demonstrates the ability to make significant improvements in function in a reasonable and predictable amount of time.     Precautions / Restrictions Precautions Precautions: Fall Restrictions Weight Bearing Restrictions Per Provider Order: No      Mobility  Bed Mobility Overal bed mobility: Needs Assistance Bed Mobility: Sit to Supine       Sit to supine: Min assist, Mod assist   General bed mobility comments: slow labored movement, had difficulty with HOB flat, (has adjustable bed at home and usually has head raised)    Transfers Overall transfer level: Needs assistance Equipment used: Quad cane Transfers: Sit to/from Stand, Bed to chair/wheelchair/BSC Sit to Stand: Supervision, Contact guard assist   Step pivot transfers: Supervision, Contact guard assist       General transfer comment: slightly labored movement without loss of balance    Ambulation/Gait Ambulation/Gait assistance: Supervision, Contact guard assist Gait Distance (Feet): 65 Feet Assistive device: Quad cane Gait Pattern/deviations: Step-to pattern, Decreased step length - left, Decreased stance time - right, Decreased stride length Gait velocity: decreased     General Gait  Details: slightly labored movement with mostly step-to pattern using her quad-cane, once fatigued had to frequently lean on wall, side rails in hallway  Stairs            Wheelchair Mobility     Tilt  Bed    Modified Rankin (Stroke Patients Only)       Balance Overall balance assessment: Needs assistance Sitting-balance support: Feet supported, No upper extremity supported Sitting balance-Leahy Scale: Good Sitting balance - Comments: seated at EOB   Standing balance support: Reliant on assistive device for balance, During functional activity, Single extremity supported Standing balance-Leahy Scale: Fair Standing balance comment: fair/good using quad-cane                             Pertinent Vitals/Pain Pain Assessment Pain Assessment: No/denies pain    Home Living Family/patient expects to be discharged to:: Private residence Living Arrangements: Spouse/significant other Available Help at Discharge: Family Type of Home: House Home Access: Stairs to enter Entrance Stairs-Rails: None Entrance Stairs-Number of Steps: 1   Home Layout: One level Home Equipment: Agricultural consultant (2 wheels);BSC/3in1;Shower seat      Prior Function Prior Level of Function : Needs assist       Physical Assist : Mobility (physical);ADLs (physical) Mobility (physical): Bed mobility;Transfers;Gait;Stairs   Mobility Comments: household and short distanced community ambulation using SPC ADLs Comments: Independent with most household ADLs, assisted by family     Extremity/Trunk Assessment   Upper Extremity Assessment Upper Extremity Assessment: Defer to OT evaluation    Lower Extremity Assessment Lower Extremity Assessment: Generalized weakness    Cervical / Trunk Assessment Cervical / Trunk Assessment: Normal  Communication   Communication Communication: No apparent difficulties    Cognition Arousal: Alert Behavior During Therapy: WFL for tasks assessed/performed   PT - Cognitive impairments: No apparent impairments                         Following commands: Intact       Cueing Cueing Techniques: Verbal cues, Tactile cues     General Comments       Exercises     Assessment/Plan    PT Assessment Patient needs continued PT services  PT Problem List Decreased strength;Decreased activity tolerance;Decreased balance;Decreased mobility       PT Treatment Interventions DME instruction;Gait training;Stair training;Functional mobility training;Therapeutic activities;Therapeutic exercise;Balance training;Patient/family education    PT Goals (Current goals can be found in the Care Plan section)  Acute Rehab PT Goals Patient Stated Goal: return home with family to assist PT Goal Formulation: With patient/family Time For Goal Achievement: 10/26/23 Potential to Achieve Goals: Good    Frequency Min 3X/week     Co-evaluation               AM-PAC PT 6 Clicks Mobility  Outcome Measure Help needed turning from your back to your side while in a flat bed without using bedrails?: A Little Help needed moving from lying on your back to sitting on the side of a flat bed without using bedrails?: A Lot Help needed moving to and from a bed to a chair (including a wheelchair)?: A Little Help needed standing up from a chair using your arms (e.g., wheelchair or bedside chair)?: A Little Help needed to walk in hospital room?: A Little Help needed climbing 3-5 steps with a railing? : A Little 6 Click Score: 17    End of  Session   Activity Tolerance: Patient tolerated treatment well;Patient limited by fatigue Patient left: in chair;with call bell/phone within reach;with family/visitor present Nurse Communication: Mobility status PT Visit Diagnosis: Unsteadiness on feet (R26.81);Other abnormalities of gait and mobility (R26.89)    Time: 1011-1030 PT Time Calculation (min) (ACUTE ONLY): 19 min   Charges:   PT Evaluation $PT Eval Low Complexity: 1 Low PT Treatments $Therapeutic Activity: 8-22 mins PT General Charges $$ ACUTE PT VISIT: 1 Visit         3:31 PM, 10/22/23 Lynwood Music, MPT Physical Therapist with  [facility] [phone] office [phone] mobile phone

## 2023-10-22 NOTE — Discharge Instructions (Addendum)
 [name] will be contacting you within 2 days of your discharge to discuss home physical therapy. If you do not hear from [facility], call [phone].   Seizure precautions: Per [place]  DMV statutes, patients with seizures are not allowed to drive until they have been seizure-free for six months and cleared by a physician    Use caution when using heavy equipment or power tools. Avoid working on ladders or at heights. Take showers instead of baths. Ensure the water temperature is not too high on the home water heater. Do not go swimming alone. Do not lock yourself in a room alone (i.e. bathroom). When caring for infants or small children, sit down when holding, feeding, or changing them to minimize risk of injury to the child in the event you have a seizure. Maintain good sleep hygiene. Avoid alcohol .    If patient has another seizure, call 911 and bring them back to the ED if: A.  The seizure lasts longer than 5 minutes.      B.  The patient doesn't wake shortly after the seizure or has new problems such as difficulty seeing, speaking or moving following the seizure C.  The patient was injured during the seizure D.  The patient has a temperature over 102 F (39C) E.  The patient vomited during the seizure and now is having trouble breathing    During the Seizure   - First, ensure adequate ventilation and place patients on the floor on their left side  Loosen clothing around the neck and ensure the airway is patent. If the patient is clenching the teeth, do not force the mouth open with any object as this can cause severe damage - Remove all items from the surrounding that can be hazardous. The patient may be oblivious to what's happening and may not even know what he or she is doing. If the patient is confused and wandering, either gently guide him/her away and block access to outside areas - Reassure the individual and be comforting - Call 911. In most  cases, the seizure ends before EMS arrives. However, there are cases when seizures may last over 3 to 5 minutes. Or the individual may have developed breathing difficulties or severe injuries. If a pregnant patient or a person with diabetes develops a seizure, it is prudent to call an ambulance.      After the Seizure (Postictal Stage)   After a seizure, most patients experience confusion, fatigue, muscle pain and/or a headache. Thus, one should permit the individual to sleep. For the next few days, reassurance is essential. Being calm and helping reorient the person is also of importance.   Most seizures are painless and end spontaneously. Seizures are not harmful to others but can lead to complications such as stress on the lungs, brain and the heart. Individuals with prior lung problems may develop labored breathing and respiratory distress.

## 2023-10-22 NOTE — Evaluation (Signed)
 Speech Language Pathology Evaluation Patient Details Name: Jessica Strong MRN: [id number] DOB: 1945-10-14 Today's Date: 10/22/2023 Time: 1211-1228 SLP Time Calculation (min) (ACUTE ONLY): 17 min  Problem List:  Patient Active Problem List   Diagnosis Date Noted   Acute encephalopathy 10/21/2023   Seizure-like activity (HCC) 10/20/2023   Lumbar pain 05/25/2022   Hyperlipidemia 02/21/2022   Hypothyroidism 11/25/2021   Onychomycosis of right great toe 09/20/2021   Weight gain 09/20/2021   Peripheral edema 09/20/2021   Constipation 07/20/2021   Gastritis and gastroduodenitis 10/13/2020   Tinea pedis of both feet 10/13/2020   Penicillin  allergy 10/13/2020   History of colonic polyps 08/26/2020   OSA on CPAP 12/27/2017   Other chest pain    CVA (cerebral vascular accident) (HCC) 11/30/2016   Morbid obesity (HCC) 11/30/2016   Essential hypertension 11/30/2016   H/O gastric bypass 11/30/2016   DOE (dyspnea on exertion)    Diastolic dysfunction 03/20/2007   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   DM2 (diabetes mellitus, type 2) (HCC) 04/28/2003   Depression 05/08/2001   Past Medical History:  Past Medical History:  Diagnosis Date   Allergy    Anemia    Anxiety    Arthritis    Asthma    Cataract    cervical ca 1990   Depression    Diabetes (HCC)    Dyspnea    Hypercholesterolemia    Hypertension    Hypothyroidism    Myocardial infarction (HCC) 2005   Sleep apnea    on CPAP 14 mm PS since February 2017   Stroke [facility]) 2007   weakness on R side   Vitamin D  deficiency    Past Surgical History:  Past Surgical History:  Procedure Laterality Date   ABDOMINAL HYSTERECTOMY     BARIATRIC SURGERY     BIOPSY  09/17/2020   Procedure: BIOPSY;  Surgeon: Eartha Angelia Sieving, MD;  Location: [facility];  Service: Gastroenterology;;   BIOPSY  12/08/2022   Procedure: BIOPSY;  Surgeon: Eartha Angelia Sieving, MD;  Location: [facility];  Service: Gastroenterology;;    BREAST BIOPSY Right    beningn   CARDIAC CATHETERIZATION N/A 12/24/2015   Procedure: Right/Left Heart Cath and Coronary Angiography;  Surgeon: Candyce GORMAN Reek, MD;  Location: [facility] INVASIVE CV LAB;  Service: Cardiovascular;  Laterality: N/A;   COLONOSCOPY  2018   COLONOSCOPY WITH PROPOFOL  N/A 09/17/2020   Procedure: COLONOSCOPY WITH PROPOFOL ;  Surgeon: Eartha Angelia Sieving, MD;  Location: [facility];  Service: Gastroenterology;  Laterality: N/A;  9:00   ESOPHAGOGASTRODUODENOSCOPY (EGD) WITH PROPOFOL  N/A 09/17/2020   Procedure: ESOPHAGOGASTRODUODENOSCOPY (EGD) WITH PROPOFOL ;  Surgeon: Eartha Angelia Sieving, MD;  [facility]: [facility];  Service: Gastroenterology;  Laterality: N/A;   ESOPHAGOGASTRODUODENOSCOPY (EGD) WITH PROPOFOL  N/A 12/08/2022   Procedure: ESOPHAGOGASTRODUODENOSCOPY (EGD) WITH PROPOFOL ;  Surgeon: Eartha Angelia Sieving, MD;  Location: [facility];  Service: Gastroenterology;  Laterality: N/A;  2:30PM;ASA 3   HERNIA REPAIR     LEFT HEART CATH AND CORONARY ANGIOGRAPHY N/A 07/12/2017   Procedure: LEFT HEART CATH AND CORONARY ANGIOGRAPHY;  Surgeon: Claudene Victory ORN, MD;  Location: [facility] INVASIVE CV LAB;  Service: Cardiovascular;  Laterality: N/A;   HPI:  Pt is a 78 y.o. female with hx of prior CVA (2007), MI, remote aneurysm clip ' 07, CKD stage III, diabetes type 2, hypertension, hypothyroidism, morbid obesity, Roux-en-Y gastric bypass, OSA, mood d/o, who presented with AMS on 6/21. Family reports baseline memory deficits since prior CVA, but normally  is oriented and independent with ADLs. Has baseline right-sided hemiparesis. ST ordered for cog/ling evaluation to determine current level of function.   CT head 10/20/23:  1. No acute intracranial abnormality.   2. Left frontal and temporal lobe encephalomalacia.   3. Old bilateral cerebellar infarcts and old anterior left thalamic infarct.   Assessment / Plan / Recommendation Clinical Impression  Pt seen for SLE. Pt's  husband present and reports pt is not at baseline level of functioning. He states she had some residual STM deficits after her CVA in 2007 but that she was able to manage iADLs independently prior to admission. Pt was lethargic but was able to participate. She was able to answer questions verbally with short sentences but was delayed in her processing speed and responses. She was oriented to place but not situation. Not oriented to month or year (and was unable to guess). She immediately recalled 5/5 words, decreased to 0/5 with 5 minute delayed recall. 67% acc for working memory task with serial backward numbers. 75% acc for auditory comprehension tasks. 100% acc for following 1-step commands. 100% acc for simple calculations.   Pt's voice, resonance, and motor speech skills WNL. Pt presents with cognitive linguistic deficits in STM (storage/retrieval), working memory, and information processing. She would likely benefit from ST services at the next level of care. Provided education to her husband on options for OP/HH/STR Speech Therapy and he verbalized understanding. No further ST services warranted at this level of care.       SLP Assessment  SLP Recommendation/Assessment: All further Speech Language Pathology needs can be addressed in the next venue of care SLP Visit Diagnosis: Cognitive communication deficit (R41.841)     Assistance Recommended at Discharge  Intermittent Supervision/Assistance  Functional Status Assessment Patient has had a recent decline in their functional status and demonstrates the ability to make significant improvements in function in a reasonable and predictable amount of time.             SLP Evaluation Cognition  Overall Cognitive Status: Impaired/Different from baseline Arousal/Alertness: Awake/alert Orientation Level: Oriented to person;Oriented to place Year: Other (Comment) (stated she did not know) Attention: Sustained Sustained Attention:  Impaired Memory: Impaired Memory Impairment: Storage deficit;Retrieval deficit;Decreased recall of new information Awareness: Impaired Awareness Impairment: Intellectual impairment Problem Solving: Appears intact Safety/Judgment: Appears intact       Comprehension  Auditory Comprehension Overall Auditory Comprehension: Appears within functional limits for tasks assessed Yes/No Questions: Within Functional Limits Commands: Within Functional Limits Interfering Components: Processing speed EffectiveTechniques: Extra processing time Visual Recognition/Discrimination Discrimination: Not tested Reading Comprehension Reading Status: Not tested    Expression Expression Primary Mode of Expression: Verbal Verbal Expression Overall Verbal Expression: Appears within functional limits for tasks assessed Initiation: Impaired Repetition: No impairment Naming: No impairment Pragmatics: No impairment Interfering Components: Premorbid deficit   Oral / Motor  Oral Motor/Sensory Function Overall Oral Motor/Sensory Function: Within functional limits Motor Speech Overall Motor Speech: Appears within functional limits for tasks assessed Respiration: Within functional limits Phonation: Normal Resonance: Within functional limits Articulation: Within functional limitis Intelligibility: Intelligible Motor Planning: Within functional limits Motor Speech Errors: Not applicable            Waddell JONETTA Novak, MA CCC-SLP 10/22/2023, 2:23 PM

## 2023-10-22 NOTE — Plan of Care (Signed)
  Problem: Acute Rehab PT Goals(only PT should resolve) Goal: Pt Will Go Supine/Side To Sit Outcome: Progressing Flowsheets (Taken 10/22/2023 1532) Pt will go Supine/Side to Sit:  with contact guard assist  with minimal assist Goal: Patient Will Transfer Sit To/From Stand Outcome: Progressing Flowsheets (Taken 10/22/2023 1532) Patient will transfer sit to/from stand:  with supervision  with modified independence Goal: Pt Will Transfer Bed To Chair/Chair To Bed Outcome: Progressing Flowsheets (Taken 10/22/2023 1532) Pt will Transfer Bed to Chair/Chair to Bed:  with modified independence  with supervision Goal: Pt Will Ambulate Outcome: Progressing Flowsheets (Taken 10/22/2023 1532) Pt will Ambulate:  75 feet  with supervision  with modified independence Note: Quad-cane   3:33 PM, 10/22/23 Lynwood Music, MPT Physical Therapist with [facility] [phone] office [phone] mobile phone

## 2023-10-22 NOTE — Progress Notes (Signed)
 PROGRESS NOTE   Jessica  [place] Strong  FMW:[id number] DOB: 1945/07/09 DOA: 10/20/2023 PCP: Zarwolo, Gloria, FNP   Chief Complaint  Patient presents with   Altered Mental Status   Level of care: Telemetry  Brief Admission History:  78 y.o. 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 They note at baseline since her prior stroke that she has memory deficits, but normally is oriented, conversant, independent with ADLs.  Has baseline right-sided hemiparesis.  Today family were all sitting together and around 3:45 PM they noted that patient was not interacting with them normally.  They realize that she was disoriented, did not recognize family members, did not know where she was, did not recall her birthdate.  Needing more assistance around the house.  She seems to be squinting and looking around abnormally, possible eyelid twitching bilaterally.  Later her husband noted a twisting of her face describes her jaw pulling to the left side.  Reports similar symptoms with stroke in the past.  Episode was transient.  She maintained consciousness.  No additional abnormal movements.  No known history of seizure disorder.  No other recent illness.  No falls or head injury. Reports that think she has had prior MRI scan after her aneurysm clip; however no records available in system.  Pt was admitted for further management and observation.    Assessment and Plan:  Encephalopathy, disorientation, acute onset.  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  LKWT 6/21 ~ 1545. Acute AMS with disorientation. + twitching of eyelids bilateral, and transient twisting of face. On my evaluation family reports improving mental status. She is A+O x self, [place] [facility], and recognizes  family. Not oriented to time or situation. Memory impaired to short term. No focal deficits noted, 4/5 symmetric strength.  Lab evaluation unrevealing for underlying cause of encephalopathy or trigger for seizure.  CT head negative for acute finding, does show left frontal and temporal encephalomalacia as well as old bilateral cerebellar and left anterior thalamic strokes.  - EDP consulted with neurology Dr. Clayburn re: presentation, recommends starting on Keppra  250 mg twice daily, if unable to get MRI get CT head at 24 hours.  If not returning to baseline re: consultation with neurology possible transfer to [facility].  For now okay for observation at [facility]. -Tentatively have ordered CT head for 6/22 at 2200; asked family to try to locate manufacturing info for aneurysm clip if they are able. - Seizure precautions, neurochecks  - Swallow screen then clear liquid diet for now - Check B12, VBG, ammonia.   - PT/OT/SLP ordered -Telemonitoring - Needs additional instruction re: activity restriction/driving restriction in setting of possible seizures -- appreciate teleneuro consult today Dr. Shelton ordered MRI brain and EEG;  ordered folate, thiamine, RPR   History of prior CVA: 6/16 LDL 67, A1c 8.2%. continue home aspirin  325 mg daily. Continue home simvastatin    Prior MI: Antiplatelet/statin per above  Remote aneurysm clip '07: noted, see above unclear if MRI safe   Diabetes type 2: Home insulin  regimen is Tresiba  66 units nightly, also on Mounjaro .  With encephalopathy anticipate decreased oral intake.  Conservative reduction and transition to Semglee  28 units nightly, SSI for moderate for now, titrate as needed.  Hypertension: Hold home olmesartan  for permissive hypertension in case symptoms represents stroke  CKD  stage III: Baseline creatinine near 1.8 Morbid obesity: Would benefit from weight loss outpatient History of Roux-en-Y bypass: Noted OSA: CPAP at bedtime  Mood disorder: Noted  not currently on medication   DVT prophylaxis: SCDs Code Status: FULL  Family Communication: husband bedside update 6/22 Disposition: Status is: Inpatient   Consultants:  Teleneurology  Procedures:   Antimicrobials:    Subjective: Husband says she is not at her baseline, she is not speaking to him now but she had been much more conversant yesterday evening.  No further seizures per husband. Pt is not speaking or communicating with me.    Objective: Vitals:   10/21/23 1123 10/21/23 2009 10/22/23 0300 10/22/23 1328  BP: 137/68 125/61 (!) 113/55 (!) 144/83  Pulse: 65 69 68 79  Resp: 18 20 18 18   Temp:  98.2 F (36.8 C) 98.3 F (36.8 C) 98 F (36.7 C)  TempSrc:  Oral Oral Oral  SpO2: 96% 95% 93% 95%  Weight:      Height:        Intake/Output Summary (Last 24 hours) at 10/22/2023 1636 Last data filed at 10/22/2023 0441 Gross per 24 hour  Intake 720 ml  Output --  Net 720 ml   Filed Weights   10/20/23 1944 10/20/23 2335  Weight: 121.8 kg 121.4 kg   Examination:  General exam: Appears calm and comfortable she is not verbalizing.  Respiratory system: Clear to auscultation. Respiratory effort normal. Cardiovascular system: normal S1 & S2 heard. No JVD, murmurs, rubs, gallops or clicks. No pedal edema. Gastrointestinal system: Abdomen is nondistended, soft and nontender. No organomegaly or masses felt. Normal bowel sounds heard. Central nervous system: Alert but not speaking. No focal neurological deficits. Extremities: Symmetric 5 x 5 power. Skin: No rashes, lesions or ulcers. Psychiatry: unable to determine  Data Reviewed: I have personally reviewed following labs and imaging studies  CBC: Recent Labs  Lab 10/20/23 2047 10/21/23 0438  WBC 8.6 8.5  NEUTROABS 5.0  --   HGB 10.3* 9.5*  HCT 33.0* 29.8*  MCV 84.2 83.2  PLT 271 258    Basic Metabolic Panel: Recent Labs  Lab 10/20/23 2047 10/21/23 0438  NA 142 138  K 4.4 4.1  CL 107 110  CO2 23 21*   GLUCOSE 99 82  BUN 30* 28*  CREATININE 1.84* 1.59*  CALCIUM  9.1 8.5*  MG  --  2.0  PHOS  --  3.8    CBG: Recent Labs  Lab 10/21/23 2011 10/22/23 0253 10/22/23 0726 10/22/23 1117 10/22/23 1605  GLUCAP 164* 124* 111* 183* 124*    No results found for this or any previous visit (from the past 240 hours).   Radiology Studies: CT HEAD WO CONTRAST ( ) Result Date: 10/21/2023 CLINICAL DATA:  Stroke follow-up EXAM: CT HEAD WITHOUT CONTRAST TECHNIQUE: Contiguous axial images were obtained from the base of the skull through the vertex without intravenous contrast. RADIATION DOSE REDUCTION: This exam was performed according to the departmental dose-optimization program which includes automated exposure control, adjustment of the mA and/or kV according to patient size and/or use of iterative reconstruction technique. COMPARISON:  10/20/2023 FINDINGS: Brain: No mass, hemorrhage or extra-axial collection. Unchanged appearance of left frontal and temporal encephalomalacia and multiple old cerebellar infarcts. Vascular: Aneurysm clips at the left carotid terminus. Skull: Old left pterional craniotomy Sinuses/Orbits: No fluid levels or advanced mucosal thickening of the visualized paranasal sinuses. No mastoid or middle ear effusion. Normal orbits. Other: None. IMPRESSION: 1. No acute intracranial abnormality. 2. Unchanged  appearance of left frontal and temporal encephalomalacia and multiple old cerebellar infarcts. Electronically Signed   By: Franky Stanford M.D.   On: 10/21/2023 20:45   DG Chest 1 View Result Date: 10/20/2023 CLINICAL DATA:  Altered mental status EXAM: CHEST  1 VIEW COMPARISON:  Chest x-ray 08/10/2023 FINDINGS: The heart size and mediastinal contours are within normal limits. Both lungs are clear. The visualized skeletal structures are unremarkable. IMPRESSION: No active disease. Electronically Signed   By: Greig Pique M.D.   On: 10/20/2023 20:28   CT Head Wo Contrast Result Date:  10/20/2023 CLINICAL DATA:  Altered mental status EXAM: CT HEAD WITHOUT CONTRAST TECHNIQUE: Contiguous axial images were obtained from the base of the skull through the vertex without intravenous contrast. RADIATION DOSE REDUCTION: This exam was performed according to the departmental dose-optimization program which includes automated exposure control, adjustment of the mA and/or kV according to patient size and/or use of iterative reconstruction technique. COMPARISON:  None Available. FINDINGS: Brain: Left frontal and temporal lobe encephalomalacia. Old bilateral cerebellar infarcts and old anterior left thalamic infarct. No mass, hemorrhage or extra-axial collection. 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 Skull: Remote left pterional craniotomy. Sinuses/Orbits: No fluid levels or advanced mucosal thickening of the visualized paranasal sinuses. No mastoid or middle ear effusion. Normal orbits. Other: None. IMPRESSION: 1. No acute intracranial abnormality. 2. Left frontal and temporal lobe encephalomalacia. 3. Old bilateral cerebellar infarcts and old anterior left thalamic infarct. Electronically Signed   By: Franky Stanford M.D.   On: 10/20/2023 20:18    Scheduled Meds:  aspirin   325 mg Oral Daily   insulin  aspart  0-15 Units Subcutaneous TID WC   insulin  glargine-yfgn  18 Units Subcutaneous QHS   levETIRAcetam   250 mg Oral BID   levothyroxine   88 mcg Oral Q0600   pantoprazole   40 mg Oral Daily   simvastatin   20 mg Oral QHS   sodium chloride  flush  3 mL Intravenous Q12H   Continuous Infusions:   LOS: 2 days   Time spent: 50 mins  Kylinn Shropshire Vicci, MD How to contact the [facility] Attending or Consulting provider 7A - 7P or covering provider during after hours 7P -7A, for this patient?  Check the care team in [facility] and look for a) attending/consulting TR[facility] provider listed and b) the TRH team listed Log into [email or web address] to find provider on call.  Locate the TRH provider you are looking  for under [facility] and page to a number that you can be directly reached. If you still have difficulty reaching the provider, please page the [facility] (Director on Call) for the Hospitalists listed on amion for assistance.  10/22/2023, 4:36 PM

## 2023-10-23 ENCOUNTER — Inpatient Hospital Stay (HOSPITAL_COMMUNITY)
Admit: 2023-10-23 | Discharge: 2023-10-23 | Disposition: A | Discharge status: Home / Self Care | Attending: Family Medicine | Admitting: Family Medicine

## 2023-10-23 DIAGNOSIS — R4182 Altered mental status, unspecified: Secondary | ICD-10-CM | POA: Diagnosis not present

## 2023-10-23 DIAGNOSIS — R569 Unspecified convulsions: Secondary | ICD-10-CM | POA: Diagnosis not present

## 2023-10-23 DIAGNOSIS — G9340 Encephalopathy, unspecified: Secondary | ICD-10-CM | POA: Diagnosis not present

## 2023-10-23 LAB — GLUCOSE, CAPILLARY
Glucose-Capillary: 137 mg/dL — ABNORMAL HIGH (ref 70–99)
Glucose-Capillary: 137 mg/dL — ABNORMAL HIGH (ref 70–99)
Glucose-Capillary: 133 mg/dL — ABNORMAL HIGH (ref 70–99)
Glucose-Capillary: 169 mg/dL — ABNORMAL HIGH (ref 70–99)
Glucose-Capillary: 197 mg/dL — ABNORMAL HIGH (ref 70–99)

## 2023-10-23 LAB — RPR: RPR Ser Ql: NONREACTIVE

## 2023-10-23 LAB — FOLATE: Folate: 26.7 ng/mL (ref >5.9)

## 2023-10-23 MED ORDER — IRBESARTAN 150 MG PO TABS
150.0000 mg | ORAL_TABLET | Freq: Every day | ORAL | Status: DC
  Administered 2023-10-23 – 2023-10-26 (×4): 150 mg via ORAL
  Filled 2023-10-23 (×4): qty 1

## 2023-10-23 NOTE — Evaluation (Signed)
 Occupational Therapy Evaluation Patient Details Name: Jessica Strong  CHARLE MCLAURIN MRN: [id number] DOB: [birth year] Today's Date: 10/23/2023   History of Present Illness   Jessica  KISSA Strong is a 78 y.o. 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 They note at baseline since her prior stroke that she has memory deficits, but normally is oriented, conversant, independent with ADLs.  Has baseline right-sided hemiparesis.  Family were all sitting together when they noted that patient was not interacting with them normally.  They realize that she was disoriented, did not recognize family members, did not know where she was, did not recall her birthdate.  Needing more assistance around the house.  She seems to be squinting and looking around abnormally, possible eyelid twitching bilaterally.  Later her husband noted a twisting of her face describes her jaw pulling to the left side.  Reports similar symptoms with stroke in the past.  Episode was transient.  She maintained consciousness.  No additional abnormal movements.  No known history of seizure disorder.  No other recent illness.  No falls or head injury. Reports that think she has had prior MRI scan after her aneurysm clip; however no records available in system. They will try to locate manufacturer infor.     Clinical Impressions Pt agreeable to OT evaluation. Pt reports she is feeling better, able to give PLOF information. Pt requiring min/mod assist for bed mobility, once in sitting able to perform mobility tasks with supervision. Pt performing ADLs while seated due to fatigue in standing. Reports family does not usually need to assist with ADLs, but can if needed. Recommend continued skilled OT services during acute stay to further assess and monitor ADL participation and mobility.      If plan is discharge  home, recommend the following:   A little help with walking and/or transfers;A little help with bathing/dressing/bathroom;Assistance with cooking/housework     Functional Status Assessment   Patient has had a recent decline in their functional status and demonstrates the ability to make significant improvements in function in a reasonable and predictable amount of time.     Equipment Recommendations   Tub/shower seat      Precautions/Restrictions   Precautions Precautions: Fall Restrictions Weight Bearing Restrictions Per Provider Order: No     Mobility Bed Mobility Overal bed mobility: Needs Assistance Bed Mobility: Sit to Supine       Sit to supine: Min assist, Mod assist, HOB elevated        Transfers Overall transfer level: Needs assistance Equipment used: Rolling walker (2 wheels) Transfers: Sit to/from Stand, Bed to chair/wheelchair/BSC Sit to Stand: Supervision     Step pivot transfers: Supervision                ADL either performed or assessed with clinical judgement   ADL Overall ADL's : Needs assistance/impaired Eating/Feeding: Modified independent;Sitting   Grooming: Set up;Sitting Grooming Details (indicate cue type and reason): pt completed in sitting, OT provided items required                 Toilet Transfer: Supervision/safety;Stand-pivot Toilet Transfer Details (indicate cue type and reason): simulated with bed to chair transfer           General ADL Comments: Appears close to baseline, requiring increased time for tasks due to mild confusion     Vision Baseline Vision/History: 0  No visual deficits Vision Assessment?: No apparent visual deficits            Pertinent Vitals/Pain Pain Assessment Pain Assessment: No/denies pain     Extremity/Trunk Assessment     Lower Extremity Assessment Lower Extremity Assessment: Generalized weakness   Cervical / Trunk Assessment Cervical / Trunk Assessment: Normal    Communication Communication Communication: No apparent difficulties   Cognition Arousal: Alert Behavior During Therapy: WFL for tasks assessed/performed                                 Following commands: Intact       Cueing  General Comments   Cueing Techniques: Verbal cues;Tactile cues              Home Living Family/patient expects to be discharged to:: Private residence Living Arrangements: Spouse/significant other Available Help at Discharge: Family;Available 24 hours/day Type of Home: House Home Access: Stairs to enter [employer] of Steps: 2 Entrance Stairs-Rails: None Home Layout: One level     Bathroom Shower/Tub: Producer, television/film/video: Handicapped height     Home Equipment: BSC/3in1;Cane - quad      Lives With: Spouse    Prior Functioning/Environment Prior Level of Function : Needs assist       Physical Assist : Mobility (physical);ADLs (physical)     Mobility Comments: household and short distanced community ambulation using quad cane ADLs Comments: reports independence in ADLs    OT Problem List: Decreased strength;Decreased activity tolerance;Impaired balance (sitting and/or standing)   OT Treatment/Interventions: Self-care/ADL training;Therapeutic exercise;DME and/or AE instruction;Therapeutic activities;Patient/family education      OT Goals(Current goals can be found in the care plan section)   Acute Rehab OT Goals Patient Stated Goal: To be independent OT Goal Formulation: With patient Time For Goal Achievement: 11/06/23 Potential to Achieve Goals: Good   OT Frequency:  Min 1X/week       AM-PAC OT 6 Clicks Daily Activity     Outcome Measure Help from another person eating meals?: A Little Help from another person taking care of personal grooming?: A Little Help from another person toileting, which includes using toliet, bedpan, or urinal?: A Little Help from another person bathing  (including washing, rinsing, drying)?: A Lot Help from another person to put on and taking off regular upper body clothing?: A Little Help from another person to put on and taking off regular lower body clothing?: A Lot 6 Click Score: 16   End of Session Equipment Utilized During Treatment: Rolling walker (2 wheels)  Activity Tolerance: Patient tolerated treatment well Patient left: in chair;with call bell/phone within reach;with chair alarm set  OT Visit Diagnosis: Muscle weakness (generalized) (M62.81)                Time: 9141-9074 OT Time Calculation (min): 27 min Charges:  OT General Charges $OT Visit: 1 Visit OT Evaluation $OT Eval Low Complexity: 1 Low  Sonny Cory, OTR/L  [phone] 10/23/2023, 9:59 AM

## 2023-10-23 NOTE — Care Plan (Signed)
 More awake and close to baseline this morning but again having trouble recognizing husband. DDX include intermittent sz vs dementia vs sundowning./delirium. Recommend transfer to [facility] for LTM.  Jessica Strong O Jerrett Baldinger

## 2023-10-23 NOTE — Plan of Care (Signed)
   Problem: Activity: Goal: Risk for activity intolerance will decrease Outcome: Progressing   Problem: Coping: Goal: Level of anxiety will decrease Outcome: Progressing   Problem: Pain Managment: Goal: General experience of comfort will improve and/or be controlled Outcome: Progressing

## 2023-10-23 NOTE — Progress Notes (Addendum)
 PROGRESS NOTE   Jessica  DEMIYA Strong  FMW:[id number] DOB: 11-15-1945 DOA: 10/20/2023 PCP: Zarwolo, Gloria, FNP   Chief Complaint  Patient presents with   Altered Mental Status   Level of care: Telemetry  Brief Admission History:  78 y.o. 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 They note at baseline since her prior stroke that she has memory deficits, but normally is oriented, conversant, independent with ADLs.  Has baseline right-sided hemiparesis.  Today family were all sitting together and around 3:45 PM they noted that patient was not interacting with them normally.  They realize that she was disoriented, did not recognize family members, did not know where she was, did not recall her birthdate.  Needing more assistance around the house.  She seems to be squinting and looking around abnormally, possible eyelid twitching bilaterally.  Later her husband noted a twisting of her face describes her jaw pulling to the left side.  Reports similar symptoms with stroke in the past.  Episode was transient.  She maintained consciousness.  No additional abnormal movements.  No known history of seizure disorder.  No other recent illness.  No falls or head injury. Reports that think she has had prior MRI scan after her aneurysm clip; however no records available in system.  Pt was admitted for further management and observation.    Assessment and Plan:  Encephalopathy, disorientation, acute onset.  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  LKWT 6/21 ~ 1545. Acute AMS with disorientation. + twitching of eyelids bilateral, and transient twisting of face. On my evaluation family reports improving mental status. She is A+O x self, [place] [facility], and recognizes  family. Not oriented to time or situation. Memory impaired to short term. No focal deficits noted, 4/5 symmetric strength.  Lab evaluation unrevealing for underlying cause of encephalopathy or trigger for seizure.  CT head negative for acute finding, does show left frontal and temporal encephalomalacia as well as old bilateral cerebellar and left anterior thalamic strokes.  - EDP consulted with neurology Dr. Clayburn re: presentation, recommends starting on Keppra  250 mg twice daily, if unable to get MRI get CT head at 24 hours.  If not returning to baseline re: consultation with neurology possible transfer to [facility].  For now okay for observation at [facility]. -repeated CT head 6/22 was reassuring no acute findings; MRI was not able to be obtained - Seizure precautions, neurochecks  - Swallow screen then clear liquid diet for now - B12, VBG, ammonia reassuring   - PT/OT/SLP ordered -Telemonitoring; EEG ordered and no epileptiform activity reported - Needs additional instruction re: activity restriction/driving restriction in setting of possible seizures -- appreciate teleneuro consult Dr. Shelton ordered MRI brain and EEG;  MRI unable to be done due to aneurysm clips in place; ordered folate, thiamine, RPR -- discussed with husband and Dr. Shelton, pt still not back to baseline. Dr. Shelton requested transfer to [facility] for continuous EEG monitoring.  Dr. Shelton requested to notify neurology when patient arrives at [facility].  Care Order placed to notify neuro when arrives at [facility].     History of prior CVA: 6/16 LDL 67, A1c 8.2%. continue home aspirin  325 mg daily. Continue home simvastatin    Prior MI: Antiplatelet/statin per above  Remote aneurysm clip '07: noted, MRI unable to be done  Diabetes type 2: Home insulin  regimen is Tresiba  66 units nightly, also on Mounjaro .  With encephalopathy anticipate decreased oral intake.  Conservative reduction and transition to Semglee  28 units nightly, SSI for moderate for  now, titrate as needed. CBG (last 3)  Recent Labs    10/22/23 1959 10/23/23 0304 10/23/23 0741  GLUCAP 211* 137* 137*   Hypertension: resumed home BP meds   CKD stage III: Baseline creatinine near 1.8  Morbid obesity: Would benefit from weight loss outpatient  History of Roux-en-Y bypass: she is at risk for vitamin deficiencies, thiamine level pending (B1)  OSA: CPAP at bedtime   Mood disorder: Noted not currently on medication   DVT prophylaxis: SCDs Code Status: FULL  Family Communication: husband bedside update 6/22 Disposition: Status is: Inpatient   Consultants:  Teleneurology  Procedures:   Antimicrobials:    Subjective: Pt is eating breakfast and able to answer questions, denies recurrent seizure   Objective: Vitals:   10/22/23 1328 10/22/23 1956 10/23/23 0010 10/23/23 0302  BP: (!) 144/83 (!) 140/68 (!) 158/60 (!) 152/65  Pulse: 79 76 71 70  Resp: 18 18 16 16   Temp: 98 F (36.7 C) 98.5 F (36.9 C) (!) 97.4 F (36.3 C) 97.8 F (36.6 C)  TempSrc: Oral Oral Oral Oral  SpO2: 95% 94% 96% 96%  Weight:      Height:        Intake/Output Summary (Last 24 hours) at 10/23/2023 0945 Last data filed at 10/23/2023 0800 Gross per 24 hour  Intake 480 ml  Output --  Net 480 ml   Filed Weights   10/20/23 1944 10/20/23 2335  Weight: 121.8 kg 121.4 kg   Examination:  General exam: Appears calm and comfortable she is communicating better.  Respiratory system: Clear to auscultation. Respiratory effort normal. Cardiovascular system: normal S1 & S2 heard. No JVD, murmurs, rubs, gallops or clicks. No pedal edema. Gastrointestinal system: Abdomen is nondistended, soft and nontender. No organomegaly or masses felt. Normal bowel sounds heard. Central nervous system: Alert and speaking better today. No focal neurological deficits. Extremities: Symmetric 5 x 5 power. Skin: No rashes, lesions or ulcers. Psychiatry: flat affect.   Data Reviewed: I have personally  reviewed following labs and imaging studies  CBC: Recent Labs  Lab 10/20/23 2047 10/21/23 0438  WBC 8.6 8.5  NEUTROABS 5.0  --   HGB 10.3* 9.5*  HCT 33.0* 29.8*  MCV 84.2 83.2  PLT 271 258    Basic Metabolic Panel: Recent Labs  Lab 10/20/23 2047 10/21/23 0438  NA 142 138  K 4.4 4.1  CL 107 110  CO2 23 21*  GLUCOSE 99 82  BUN 30* 28*  CREATININE 1.84* 1.59*  CALCIUM  9.1 8.5*  MG  --  2.0  PHOS  --  3.8    CBG: Recent Labs  Lab 10/22/23 1117 10/22/23 1605 10/22/23 1959 10/23/23 0304 10/23/23 0741  GLUCAP 183* 124* 211* 137* 137*    No results found for this or any previous visit (from the past 240 hours).   Radiology Studies: CT HEAD WO CONTRAST ( ) Result Date: 10/21/2023 CLINICAL DATA:  Stroke follow-up EXAM: CT HEAD WITHOUT CONTRAST TECHNIQUE: Contiguous axial images were obtained from the base of the skull through the vertex without intravenous contrast. RADIATION DOSE REDUCTION: This exam was performed according to the departmental dose-optimization program which includes automated exposure control, adjustment of the mA and/or kV according to patient size and/or use of iterative reconstruction technique. COMPARISON:  10/20/2023 FINDINGS: Brain:  No mass, hemorrhage or extra-axial collection. Unchanged appearance of left frontal and temporal encephalomalacia and multiple old cerebellar infarcts. Vascular: Aneurysm clips at the left carotid terminus. Skull: Old left pterional craniotomy Sinuses/Orbits: No fluid levels or advanced mucosal thickening of the visualized paranasal sinuses. No mastoid or middle ear effusion. Normal orbits. Other: None. IMPRESSION: 1. No acute intracranial abnormality. 2. Unchanged appearance of left frontal and temporal encephalomalacia and multiple old cerebellar infarcts. Electronically Signed   By: Franky Stanford M.D.   On: 10/21/2023 20:45   Scheduled Meds:  aspirin   325 mg Oral Daily   insulin  aspart  0-15 Units Subcutaneous TID WC    insulin  glargine-yfgn  18 Units Subcutaneous QHS   levETIRAcetam   250 mg Oral BID   levothyroxine   88 mcg Oral Q0600   pantoprazole   40 mg Oral Daily   simvastatin   20 mg Oral QHS   sodium chloride  flush  3 mL Intravenous Q12H   Continuous Infusions:   LOS: 3 days   Time spent: 50 mins  Aylla Huffine Vicci, MD How to contact the [facility] Attending or Consulting provider 7A - 7P or covering provider during after hours 7P -7A, for this patient?  Check the care team in [facility] and look for a) attending/consulting TR[facility] provider listed and b) the TRH team listed Log into [email or web address] to find provider on call.  Locate the TRH provider you are looking for under [facility] and page to a number that you can be directly reached. If you still have difficulty reaching the provider, please page the [facility] (Director on Call) for the Hospitalists listed on amion for assistance.  10/23/2023, 9:45 AM

## 2023-10-23 NOTE — Procedures (Signed)
 Patient Name: Tericka  SHAMON LOBO  MRN: [id number]  Epilepsy Attending: Arlin MALVA Krebs  Referring Physician/Provider: Vicci Afton CROME, MD  Date: 10/23/2023 Duration: 22.18 mins  Patient history: 78yo F with ams. EEG to evaluate for seizure  Level of alertness: Awake, asleep  AEDs during EEG study: LEV  Technical aspects: This EEG study was done with scalp electrodes positioned according to the 10-20 International system of electrode placement. Electrical activity was reviewed with band pass filter of 1-70Hz , sensitivity of 7 uV/mm, display speed of 40mm/sec with a 60Hz  notched filter applied as appropriate. EEG data were recorded continuously and digitally stored.  Video monitoring was available and reviewed as appropriate.  Description: The posterior dominant rhythm consists of 9-10 Hz activity of moderate voltage (25-35 uV) seen predominantly in posterior head regions, symmetric and reactive to eye opening and eye closing. Sleep was characterized by vertex waves, sleep spindles (12 to 14 Hz), maximal frontocentral region. Hyperventilation and photic stimulation were not performed.     IMPRESSION: This study is within normal limits. No seizures or epileptiform discharges were seen throughout the recording.  A normal interictal EEG does not exclude the diagnosis of epilepsy.  Merin Borjon O Madisson Kulaga

## 2023-10-23 NOTE — Progress Notes (Signed)
 EEG complete - results pending

## 2023-10-23 NOTE — Plan of Care (Signed)
  Problem: Acute Rehab OT Goals (only OT should resolve) Goal: Pt. Will Perform Grooming Flowsheets (Taken 10/23/2023 1002) Pt Will Perform Grooming:  with supervision  standing Goal: Pt. Will Perform Upper Body Dressing Flowsheets (Taken 10/23/2023 1002) Pt Will Perform Upper Body Dressing:  with supervision  sitting Goal: Pt. Will Perform Lower Body Dressing Flowsheets (Taken 10/23/2023 1002) Pt Will Perform Lower Body Dressing:  with supervision  sitting/lateral leans  sit to/from stand Goal: Pt. Will Transfer To Toilet Flowsheets (Taken 10/23/2023 1002) Pt Will Transfer to Toilet:  with supervision  ambulating  regular height toilet Goal: Pt. Will Perform Toileting-Clothing Manipulation Flowsheets (Taken 10/23/2023 1002) Pt Will Perform Toileting - Clothing Manipulation and hygiene:  with supervision  sitting/lateral leans  sit to/from stand Goal: OT Additional ADL Goal #1 Flowsheets (Taken 10/23/2023 1002) Additional ADL Goal #1: Pt will be educated on HEP to improve BUE strength required for ADL participation and mobility tasks.

## 2023-10-24 ENCOUNTER — Ambulatory Visit

## 2023-10-24 DIAGNOSIS — N1831 Chronic kidney disease, stage 3a: Secondary | ICD-10-CM | POA: Diagnosis not present

## 2023-10-24 DIAGNOSIS — E66813 Obesity, class 3: Secondary | ICD-10-CM

## 2023-10-24 DIAGNOSIS — G9340 Encephalopathy, unspecified: Secondary | ICD-10-CM | POA: Diagnosis not present

## 2023-10-24 DIAGNOSIS — R569 Unspecified convulsions: Secondary | ICD-10-CM | POA: Diagnosis not present

## 2023-10-24 DIAGNOSIS — N184 Chronic kidney disease, stage 4 (severe): Secondary | ICD-10-CM | POA: Insufficient documentation

## 2023-10-24 LAB — GLUCOSE, CAPILLARY
Glucose-Capillary: 179 mg/dL — ABNORMAL HIGH (ref 70–99)
Glucose-Capillary: 153 mg/dL — ABNORMAL HIGH (ref 70–99)
Glucose-Capillary: 175 mg/dL — ABNORMAL HIGH (ref 70–99)
Glucose-Capillary: 174 mg/dL — ABNORMAL HIGH (ref 70–99)

## 2023-10-24 NOTE — Plan of Care (Signed)

## 2023-10-24 NOTE — Progress Notes (Addendum)
 PROGRESS NOTE  Jessica  MALAI LADY Strong DOB: Dec 02, 1945 DOA: 10/20/2023 PCP: Zarwolo, Gloria, FNP  Brief History:  78 y.o. 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 They note at baseline since her prior stroke that she has memory deficits, but normally is oriented, conversant, independent with ADLs.  Has baseline right-sided hemiparesis.  Today family were all sitting together and around 3:45 PM they noted that patient was not interacting with them normally.  They realize that she was disoriented, did not recognize family members, did not know where she was, did not recall her birthdate.  Needing more assistance around the house.  She seems to be squinting and looking around abnormally, possible eyelid twitching bilaterally.  Later her husband noted a twisting of her face describes her jaw pulling to the left side.  Reports similar symptoms with stroke in the past.  Episode was transient.  She maintained consciousness.  No additional abnormal movements.  No known history of seizure disorder.  No other recent illness.  No falls or head injury. Reports that think she has had prior MRI scan after her aneurysm clip; however no records available in system.  Pt was admitted for further management and observation.   Neurology was consulted.  Metabolic work up has been negative.  20 min EEG was neg for seizure.  Repeat CT brain was neg.  Mental status slowly improving, but pt not back to baseline.  As a result, neurology requested transfer to [facility] for LTM.   Assessment/Plan:  Encephalopathy, disorientation, acute onset.  Abnormal twisting of face, question seizure activity Hx chronic memory deficit LKWT 6/21 ~ 1545. Acute AMS with disorientation. + twitching of eyelids bilateral, and transient twisting of face.  -Initially A+O x  self, [facility], and recognizes family. Not oriented to time or situation. Memory impaired to short term. No focal deficits noted, 4/5 symmetric strength.  Lab evaluation unrevealing for underlying cause of encephalopathy or trigger for seizure.   -10/20/23 CT head negative for acute finding, does show left frontal and temporal encephalomalacia as well as old bilateral cerebellar and left anterior thalamic strokes.  - EDP consulted with neurology Dr. Clayburn re: presentation, recommends starting on Keppra  250 mg twice daily, if unable to get MRI get CT head at 24 hours.   -repeated CT head 6/22 was reassuring no acute findings; MRI was not able to be obtained - Seizure precautions, neurochecks  - UA no pyuria - B12, VBG, ammonia, RPR reassuring   - PT/OT/SLP ordered -Telemonitoring; EEG ordered and no epileptiform activity reported -- appreciate teleneuro consult Dr. Yadav -MRI unable to be done due to aneurysm clips in place; ordered folate, thiamine, RPR -- discussed with husband and Dr. Shelton, pt still not back to baseline. Dr. Shelton requested transfer to [facility] for continuous EEG monitoring.  Dr. Shelton requested to notify neurology when patient arrives at [facility].  Care Order placed to notify neuro when arrives at [facility].     History of prior CVA: 6/16 LDL 67, A1c 8.2%. continue home aspirin  325 mg daily. Continue home simvastatin     CAD Prior MI:  -Antiplatelet/statin per above -no chest pain -continuestatin   Remote aneurysm clip '07:  -noted, MRI unable to be done    Diabetes type 2 with hyperglycemia:  -Home insulin  regimen is Tresiba  66  units nightly, also on Mounjaro .   -continue reduced dose semglee  -novolog sliding scale  Hypertension: resumed home BP meds    CKD stage IIIa:  -baseline creatinine 1.6-1.8   Morbid obesity:- -BMI 52.26 -lifestyle modification   History of Roux-en-Y bypass: - she is at risk for vitamin deficiencies, thiamine level pending (B1)   OSA: CPAP  at bedtime        Family Communication:   no Family at bedside  Consultants:  neurology  Code Status:  FULL   DVT Prophylaxis:  SCDs   Procedures: As Listed in Progress Note Above  Antibiotics: None       Subjective: Patient denies fevers, chills, headache, chest pain, dyspnea, nausea, vomiting, diarrhea, abdominal pain,    Objective: Vitals:   10/23/23 0302 10/23/23 1402 10/23/23 1958 10/24/23 0314  BP: (!) 152/65 (!) 129/59 (!) 126/56 (!) 136/56  Pulse: 70 65 70 68  Resp: 16 17 16 16   Temp: 97.8 F (36.6 C) 97.9 F (36.6 C) (!) 97.5 F (36.4 C) 98 F (36.7 C)  TempSrc: Oral  Oral Oral  SpO2: 96% 94% 97% 95%  Weight:      Height:        Intake/Output Summary (Last 24 hours) at 10/24/2023 0704 Last data filed at 10/23/2023 1817 Gross per 24 hour  Intake 840 ml  Output 850 ml  Net -10 ml   Weight change:  Exam:  General:  Pt is alert, follows commands appropriately, not in acute distress HEENT: No icterus, No thrush, No neck mass, [place]/AT Cardiovascular: RRR, S1/S2, no rubs, no gallops Respiratory: CTA bilaterally, no wheezing, no crackles, no rhonchi Abdomen: Soft/+BS, non tender, non distended, no guarding Extremities: No edema, No lymphangitis, No petechiae, No rashes, no synovitis Neuro:  CN II-XII intact, strength 4/5 in RUE, RLE, strength 4/5 LUE, LLE; sensation intact bilateral; no dysmetria; babinski equivocal    Data Reviewed: I have personally reviewed following labs and imaging studies Basic Metabolic Panel: Recent Labs  Lab 10/20/23 2047 10/21/23 0438  NA 142 138  K 4.4 4.1  CL 107 110  CO2 23 21*  GLUCOSE 99 82  BUN 30* 28*  CREATININE 1.84* 1.59*  CALCIUM  9.1 8.5*  MG  --  2.0  PHOS  --  3.8   Liver Function Tests: Recent Labs  Lab 10/20/23 2047  AST 19  ALT 16  ALKPHOS 107  BILITOT 0.5  PROT 6.7  ALBUMIN  3.5   No results for input(s): LIPASE, AMYLASE in the last 168 hours. Recent Labs  Lab 10/21/23 0438   AMMONIA 33   Coagulation Profile: No results for input(s): INR, PROTIME in the last 168 hours. CBC: Recent Labs  Lab 10/20/23 2047 10/21/23 0438  WBC 8.6 8.5  NEUTROABS 5.0  --   HGB 10.3* 9.5*  HCT 33.0* 29.8*  MCV 84.2 83.2  PLT 271 258   Cardiac Enzymes: No results for input(s): CKTOTAL, CKMB, CKMBINDEX, TROPONINI in the last 168 hours. BNP: Invalid input(s): POCBNP CBG: Recent Labs  Lab 10/23/23 0741 10/23/23 1113 10/23/23 1631 10/23/23 2001 10/24/23 0310  GLUCAP 137* 133* 169* 197* 179*   HbA1C: No results for input(s): HGBA1C in the last 72 hours. Urine analysis:    Component Value Date/Time   COLORURINE YELLOW 10/20/2023 2031   APPEARANCEUR CLEAR 10/20/2023 2031   APPEARANCEUR Clear 07/02/2023 1415   LABSPEC 1.016 10/20/2023 2031   PHURINE 5.0 10/20/2023 2031   GLUCOSEU NEGATIVE 10/20/2023 2031   HGBUR NEGATIVE 10/20/2023 2031  BILIRUBINUR NEGATIVE 10/20/2023 2031   BILIRUBINUR Negative 07/02/2023 1415   KETONESUR NEGATIVE 10/20/2023 2031   PROTEINUR NEGATIVE 10/20/2023 2031   UROBILINOGEN 0.2 03/06/2022 1416   NITRITE NEGATIVE 10/20/2023 2031   LEUKOCYTESUR NEGATIVE 10/20/2023 2031   Sepsis Labs: @LABRCNTIP (procalcitonin:4,lacticidven:4) )No results found for this or any previous visit (from the past 240 hours).   Scheduled Meds:  aspirin   325 mg Oral Daily   insulin  aspart  0-15 Units Subcutaneous TID WC   insulin  glargine-yfgn  18 Units Subcutaneous QHS   irbesartan  150 mg Oral Daily   levETIRAcetam   250 mg Oral BID   levothyroxine   88 mcg Oral Q0600   pantoprazole   40 mg Oral Daily   simvastatin   20 mg Oral QHS   sodium chloride  flush  3 mL Intravenous Q12H   Continuous Infusions:  Procedures/Studies: EEG adult Result Date: 10/23/2023 Shelton Arlin KIDD, MD     10/23/2023  4:10 PM Patient Name: Jessica Strong MRN: [id number] Epilepsy Attending: Arlin KIDD Shelton Referring Physician/Provider: Vicci Afton CROME, MD Date:  10/23/2023 Duration: 22.18 mins Patient history: 78yo F with ams. EEG to evaluate for seizure Level of alertness: Awake, asleep AEDs during EEG study: LEV Technical aspects: This EEG study was done with scalp electrodes positioned according to the 10-20 International system of electrode placement. Electrical activity was reviewed with band pass filter of 1-70Hz , sensitivity of 7 uV/mm, display speed of 100mm/sec with a 60Hz  notched filter applied as appropriate. EEG data were recorded continuously and digitally stored.  Video monitoring was available and reviewed as appropriate. Description: The posterior dominant rhythm consists of 9-10 Hz activity of moderate voltage (25-35 uV) seen predominantly in posterior head regions, symmetric and reactive to eye opening and eye closing. Sleep was characterized by vertex waves, sleep spindles (12 to 14 Hz), maximal frontocentral region. Hyperventilation and photic stimulation were not performed.   IMPRESSION: This study is within normal limits. No seizures or epileptiform discharges were seen throughout the recording. A normal interictal EEG does not exclude the diagnosis of epilepsy. Arlin KIDD Shelton   MM 3D SCREENING MAMMOGRAM BILATERAL BREAST Result Date: 10/23/2023 CLINICAL DATA:  Screening. EXAM: DIGITAL SCREENING BILATERAL MAMMOGRAM WITH TOMOSYNTHESIS AND CAD TECHNIQUE: Bilateral screening digital craniocaudal and mediolateral oblique mammograms were obtained. Bilateral screening digital breast tomosynthesis was performed. The images were evaluated with computer-aided detection. COMPARISON:  Previous exam(s). ACR Breast Density Category b: There are scattered areas of fibroglandular density. FINDINGS: There are no findings suspicious for malignancy. IMPRESSION: No mammographic evidence of malignancy. A result letter of this screening mammogram will be mailed directly to the patient. RECOMMENDATION: Screening mammogram in one year. (Code:SM-B-01Y) BI-RADS CATEGORY  1:  Negative. Electronically Signed   By: Toribio Agreste M.D.   On: 10/23/2023 13:35   CT HEAD WO CONTRAST ( ) Result Date: 10/21/2023 CLINICAL DATA:  Stroke follow-up EXAM: CT HEAD WITHOUT CONTRAST TECHNIQUE: Contiguous axial images were obtained from the base of the skull through the vertex without intravenous contrast. RADIATION DOSE REDUCTION: This exam was performed according to the departmental dose-optimization program which includes automated exposure control, adjustment of the mA and/or kV according to patient size and/or use of iterative reconstruction technique. COMPARISON:  10/20/2023 FINDINGS: Brain: No mass, hemorrhage or extra-axial collection. Unchanged appearance of left frontal and temporal encephalomalacia and multiple old cerebellar infarcts. Vascular: Aneurysm clips at the left carotid terminus. Skull: Old left pterional craniotomy Sinuses/Orbits: No fluid levels or advanced mucosal thickening of the visualized paranasal sinuses. No mastoid or  middle ear effusion. Normal orbits. Other: None. IMPRESSION: 1. No acute intracranial abnormality. 2. Unchanged appearance of left frontal and temporal encephalomalacia and multiple old cerebellar infarcts. Electronically Signed   By: Franky Stanford M.D.   On: 10/21/2023 20:45   DG Chest 1 View Result Date: 10/20/2023 CLINICAL DATA:  Altered mental status EXAM: CHEST  1 VIEW COMPARISON:  Chest x-ray 08/10/2023 FINDINGS: The heart size and mediastinal contours are within normal limits. Both lungs are clear. The visualized skeletal structures are unremarkable. IMPRESSION: No active disease. Electronically Signed   By: Greig Pique M.D.   On: 10/20/2023 20:28   CT Head Wo Contrast Result Date: 10/20/2023 CLINICAL DATA:  Altered mental status EXAM: CT HEAD WITHOUT CONTRAST TECHNIQUE: Contiguous axial images were obtained from the base of the skull through the vertex without intravenous contrast. RADIATION DOSE REDUCTION: This exam was performed according  to the departmental dose-optimization program which includes automated exposure control, adjustment of the mA and/or kV according to patient size and/or use of iterative reconstruction technique. COMPARISON:  None Available. FINDINGS: Brain: Left frontal and temporal lobe encephalomalacia. Old bilateral cerebellar infarcts and old anterior left thalamic infarct. No mass, hemorrhage or extra-axial collection. 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 Skull: Remote left pterional craniotomy. Sinuses/Orbits: No fluid levels or advanced mucosal thickening of the visualized paranasal sinuses. No mastoid or middle ear effusion. Normal orbits. Other: None. IMPRESSION: 1. No acute intracranial abnormality. 2. Left frontal and temporal lobe encephalomalacia. 3. Old bilateral cerebellar infarcts and old anterior left thalamic infarct. Electronically Signed   By: Franky Stanford M.D.   On: 10/20/2023 20:18    Alm Schneider, DO  [facility]  If 7PM-7AM, please contact night-coverage [email or web address] Password [email or web address] 10/24/2023, 7:04 AM   LOS: 4 days

## 2023-10-24 NOTE — TOC Transition Note (Signed)
 Transition of Care [facility]) - Discharge Note   Patient Details  Name: Jessica Strong MRN: [id number] Date of Birth: Sep 11, 1945  Transition of Care [facility]) CM/SW Contact:  Noreen KATHEE Pinal, LCSWA Phone Number: 10/24/2023, 10:50 AM   Clinical Narrative:     Patient is scheduled to go to [facility] today, per MD, neurology requested transfer to [facility] for LTM . Patient was setup with [facility] for HHPT they will need DC summary and update on when patient will be home.   Final next level of care: Home w Home Health Services Barriers to Discharge: Other (must enter comment) (DC to [facility])   Patient Goals and CMS Choice Patient states their goals for this hospitalization and ongoing recovery are:: return home CMS Medicare.gov Compare Post Acute Care list provided to:: Patient        Discharge Placement                  Name of family member notified: Genisis  Patient and family notified of of transfer: 10/24/23  Discharge Plan and Services Additional resources added to the After Visit Summary for                  DME Arranged: N/A DME Agency: NA       HH Arranged: PT HH Agency: [facility] Date [facility] Agency Contacted: 10/22/23   Representative spoke with at [facility] Agency: Engineering geologist  Social Drivers of Health (SDOH) Interventions SDOH Screenings   Food Insecurity: No Food Insecurity (10/21/2023)  Housing: Low Risk  (10/21/2023)  Transportation Needs: No Transportation Needs (10/21/2023)  Utilities: Not At Risk (10/21/2023)  Alcohol  Screen: Low Risk  (11/30/2022)  Depression (PHQ2-9): High Risk (10/11/2023)  Financial Resource Strain: Low Risk  (11/30/2022)  Physical Activity: Inactive (11/30/2022)  Social Connections: Moderately Isolated (10/21/2023)  Stress: No Stress Concern Present (11/30/2022)  Tobacco Use: Low Risk  (10/20/2023)  Health Literacy: Adequate Health Literacy (11/30/2022)     Readmission Risk Interventions    10/24/2023   10:48 AM  10/23/2023   11:26 AM 10/21/2023    4:08 PM  Readmission Risk Prevention Plan  Post Dischage Appt   Complete  Medication Screening Complete  Complete  Transportation Screening Complete Complete Complete  Home Care Screening  Complete   Medication Review (RN CM)  Complete

## 2023-10-24 NOTE — Plan of Care (Signed)

## 2023-10-25 ENCOUNTER — Inpatient Hospital Stay (HOSPITAL_COMMUNITY)

## 2023-10-25 DIAGNOSIS — D631 Anemia in chronic kidney disease: Secondary | ICD-10-CM | POA: Insufficient documentation

## 2023-10-25 DIAGNOSIS — R569 Unspecified convulsions: Secondary | ICD-10-CM | POA: Diagnosis not present

## 2023-10-25 DIAGNOSIS — R4189 Other symptoms and signs involving cognitive functions and awareness: Secondary | ICD-10-CM

## 2023-10-25 LAB — COMPREHENSIVE METABOLIC PANEL WITH GFR
ALT: 14 U/L (ref 0–44)
AST: 20 U/L (ref 15–41)
Albumin: 3 g/dL — ABNORMAL LOW (ref 3.5–5.0)
Alkaline Phosphatase: 82 U/L (ref 38–126)
Anion gap: 9 (ref 5–15)
BUN: 28 mg/dL — ABNORMAL HIGH (ref 8–23)
CO2: 21 mmol/L — ABNORMAL LOW (ref 22–32)
Calcium: 9.1 mg/dL (ref 8.9–10.3)
Chloride: 107 mmol/L (ref 98–111)
Creatinine, Ser: 1.89 mg/dL — ABNORMAL HIGH (ref 0.44–1.00)
GFR, Estimated: 27 mL/min — ABNORMAL LOW (ref >60)
Glucose, Bld: 215 mg/dL — ABNORMAL HIGH (ref 70–99)
Potassium: 4.4 mmol/L (ref 3.5–5.1)
Sodium: 137 mmol/L (ref 135–145)
Total Bilirubin: 0.3 mg/dL (ref 0.0–1.2)
Total Protein: 6.1 g/dL — ABNORMAL LOW (ref 6.5–8.1)

## 2023-10-25 LAB — GLUCOSE, CAPILLARY
Glucose-Capillary: 175 mg/dL — ABNORMAL HIGH (ref 70–99)
Glucose-Capillary: 233 mg/dL — ABNORMAL HIGH (ref 70–99)
Glucose-Capillary: 140 mg/dL — ABNORMAL HIGH (ref 70–99)
Glucose-Capillary: 220 mg/dL — ABNORMAL HIGH (ref 70–99)
Glucose-Capillary: 199 mg/dL — ABNORMAL HIGH (ref 70–99)

## 2023-10-25 LAB — CBC
HCT: 32.3 % — ABNORMAL LOW (ref 36.0–46.0)
Hemoglobin: 10.3 g/dL — ABNORMAL LOW (ref 12.0–15.0)
MCH: 26.3 pg (ref 26.0–34.0)
MCHC: 31.9 g/dL (ref 30.0–36.0)
MCV: 82.6 fL (ref 80.0–100.0)
Platelets: 280 10*3/uL (ref 150–400)
RBC: 3.91 MIL/uL (ref 3.87–5.11)
RDW: 14.7 % (ref 11.5–15.5)
WBC: 10 10*3/uL (ref 4.0–10.5)
nRBC: 0 % (ref 0.0–0.2)

## 2023-10-25 LAB — VITAMIN B1: Vitamin B1 (Thiamine): 96.1 nmol/L (ref 66.5–200.0)

## 2023-10-25 MED ORDER — THIAMINE MONONITRATE 100 MG PO TABS: 100.0000 mg | ORAL_TABLET | Freq: Every day | ORAL | Status: DC

## 2023-10-25 MED ORDER — THIAMINE HCL 100 MG/ML IJ SOLN
500.0000 mg | Freq: Every day | INTRAVENOUS | Status: DC
  Administered 2023-10-25: 500 mg via INTRAVENOUS
  Filled 2023-10-25 (×2): qty 5

## 2023-10-25 NOTE — Progress Notes (Signed)
 Physical Therapy Treatment Patient Details Name: Jessica Strong  PELAGIA IACOBUCCI MRN: [id number] DOB: 1945/10/13 Today's Date: 10/25/2023   History of Present Illness Jessica  Jessica Strong is a 78 y.o. female who presents with AMS.  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 Pt reports R foot pain that began yesterday and limited mobility tolerance this session. Pt requires increased assist to stand from EOB and attempted lateral steps, but only able to minimally advance L foot with increased assist. Pt able to perform BLE exercise sitting EOB with increased difficulty with RLE. Pt continues to benefit from PT services to progress toward functional mobility goals.     If plan is discharge home, recommend the following: A little help with walking and/or transfers;Help with stairs or ramp for entrance;A little help with bathing/dressing/bathroom;Assistance with cooking/housework   Can travel by private vehicle        Equipment Recommendations  None recommended by PT    Recommendations for Other Services       Precautions / Restrictions Precautions Precautions: Fall Precaution/Restrictions Comments: LTM EEG Restrictions Weight Bearing Restrictions Per Provider Order: No     Mobility  Bed Mobility Overal bed mobility: Needs Assistance Bed Mobility: Sit to Supine, Supine to Sit     Supine to sit: Contact guard, Used rails, HOB elevated Sit to supine: Min assist, Used rails, HOB elevated   General bed mobility comments: increased time and effort for RLE advancement. min A for BLE elevation back to EOB    Transfers Overall transfer level: Needs assistance Equipment used: None Transfers: Sit to/from Stand Sit to Stand: Mod assist           General transfer comment: From EOB with mod A for  power up. Attempted lateral steps with mod A via HHA for balance and weight shifting. Pt able to take minimal steps towards HOB due to R foot pain and decreased WB tolerance to step with L foot    Ambulation/Gait               General Gait Details: unable   Stairs             Wheelchair Mobility     Tilt Bed    Modified Rankin (Stroke Patients Only)       Balance Overall balance assessment: Needs assistance Sitting-balance support: Feet supported, No upper extremity supported Sitting balance-Leahy Scale: Good Sitting balance - Comments: seated at EOB   Standing balance support: Single extremity supported Standing balance-Leahy Scale: Poor Standing balance comment: requires increased support due to R foot pain                            Communication Communication Communication: No apparent difficulties  Cognition Arousal: Alert Behavior During Therapy: WFL for tasks assessed/performed   PT - Cognitive impairments: No apparent impairments                         Following commands: Intact      Cueing Cueing Techniques: Verbal cues, Tactile cues  Exercises General Exercises - Lower Extremity Long Arc Quad: AROM, Seated, Both, 10 reps    General Comments        Pertinent Vitals/Pain Pain Assessment Pain Assessment: 0-10 Pain  Score: 10-Worst pain ever Pain Location: R foot Pain Descriptors / Indicators: Aching, Grimacing, Guarding Pain Intervention(s): Limited activity within patient's tolerance     PT Goals (current goals can now be found in the care plan section) Acute Rehab PT Goals Patient Stated Goal: return home with family to assist PT Goal Formulation: With patient/family Time For Goal Achievement: 10/26/23 Progress towards PT goals: Progressing toward goals    Frequency    Min 3X/week       AM-PAC PT 6 Clicks Mobility   Outcome Measure  Help needed turning from your back to your side while in a  flat bed without using bedrails?: A Little Help needed moving from lying on your back to sitting on the side of a flat bed without using bedrails?: A Little Help needed moving to and from a bed to a chair (including a wheelchair)?: A Little Help needed standing up from a chair using your arms (e.g., wheelchair or bedside chair)?: A Lot Help needed to walk in hospital room?: Total Help needed climbing 3-5 steps with a railing? : Total 6 Click Score: 13    End of Session Equipment Utilized During Treatment: Gait belt Activity Tolerance: Patient tolerated treatment well;Patient limited by pain Patient left: in bed;with nursing/sitter in room;with call bell/phone within reach;with bed alarm set Nurse Communication: Mobility status PT Visit Diagnosis: Unsteadiness on feet (R26.81);Other abnormalities of gait and mobility (R26.89)     Time: 9153-9093 PT Time Calculation (min) (ACUTE ONLY): 20 min  Charges:    $Therapeutic Activity: 8-22 mins PT General Charges $$ ACUTE PT VISIT: 1 Visit                    Darryle George, PTA Acute Rehabilitation Services Secure Chat Preferred  [phone]    Darryle George 10/25/2023, 10:51 AM

## 2023-10-25 NOTE — Assessment & Plan Note (Signed)
TSH normal ?- Continue levothyroxine ?

## 2023-10-25 NOTE — Progress Notes (Signed)
 OT Cancellation Note  Patient Details Name: Jessica Strong  CAMERA KRIENKE MRN: [id number] DOB: 04/05/1946   Cancelled Treatment:    Reason Eval/Treat Not Completed: Patient at procedure or test/ unavailable. Pt on EEG, OT will follow up later as able  [name] 10/25/2023, 11:32 AM

## 2023-10-25 NOTE — Assessment & Plan Note (Signed)
 BMI 52, complicates care

## 2023-10-25 NOTE — Assessment & Plan Note (Addendum)
 Hyperlipidemia Hypertension No evidence of new stroke.  CTH here without new finding.  BP normal - Continue aspirin , Irbesartan, simvastatin 

## 2023-10-25 NOTE — Progress Notes (Signed)
 LTM EEG hooked up and recording with CT compatible leads. [facility] is monitoring.

## 2023-10-25 NOTE — Assessment & Plan Note (Signed)
 Cr stable relative to baseline 1.5-2.0

## 2023-10-25 NOTE — Plan of Care (Signed)
   Problem: Health Behavior/Discharge Planning: Goal: Ability to manage health-related needs will improve Outcome: Progressing

## 2023-10-25 NOTE — Assessment & Plan Note (Signed)
 -  CPAP at night

## 2023-10-25 NOTE — Assessment & Plan Note (Addendum)
 Prior to admission, family noted possible eyelid twitching bilaterally.  Later her husband noted a 'twisting' of her face describes her jaw pulling to the left side.  No seizure activity noted since arrival to the hospital.   Spot EEG here negative.  Started on Keppra  on presentation.  Aneurysm clips not MRI compatible, btu CT on admission and repeat CT after admission no acute finding. - Continue Keppra  per Neuro - cEEG pending - Consult Neurology, appreciate cares

## 2023-10-25 NOTE — Assessment & Plan Note (Addendum)
 At baseline has memory problems per family, but is normally oriented, conversant, independent with ADLs, certainly recognizes family.  Has baseline right-sided hemiparesis from old stroke.    Ona dmission, patient noted to have abrupt onset confusion, inability to recognize family, disoriented, could not recall her birthdate.    UA unremarkable, B12, ammonia, RPR, TSH, VBG unremarkable.  This has been slowly resolving since admission.  On my evaluation today appears completely resolved. - Follow thiamine level - PT/OT

## 2023-10-25 NOTE — Assessment & Plan Note (Signed)
 Some hyperglycemia noted here - Continue glargine.on SS corrections - Hold Mounjaro 

## 2023-10-25 NOTE — Plan of Care (Signed)
  Problem: Education: Goal: Knowledge of General Education information will improve Description: Including pain rating scale, medication(s)/side effects and non-pharmacologic comfort measures Outcome: Progressing   Problem: Health Behavior/Discharge Planning: Goal: Ability to manage health-related needs will improve 10/25/2023 1657 by Lynnette Cena CROME, RN Outcome: Progressing 10/25/2023 1634 by Lynnette Cena CROME, RN Outcome: Progressing

## 2023-10-25 NOTE — Progress Notes (Signed)
 Progress Note   Patient: Jessica Strong FMW:[id number] DOB: 1945-08-12 DOA: 10/20/2023     5 DOS: the patient was seen and examined on 10/25/2023 at 9:39AM      Brief hospital course: 78 y.o. F with MO, hx Roux-en-Y, hx CVA residual R hemiparesis, hx cerebral aneurysm s/p clip 2007, DM, HTN, hypothyroidism, OSA and CKD IV baseline 1.7-2 who presented with confusion.   Neurology was consulted.  Metabolic work up has been negative.  20 min EEG was neg for seizure.  Repeat CT brain was neg.  Mental status slowly improving, but pt not back to baseline.  As a result, neurology requested transfer to [facility] for LTM.     Assessment and Plan: * Seizure-like activity (HCC) Prior to admission, family noted possible eyelid twitching bilaterally.  Later her husband noted a 'twisting' of her face describes her jaw pulling to the left side.  No seizure activity noted since arrival to the hospital.   Spot EEG here negative.  Started on Keppra  on presentation.  Aneurysm clips not MRI compatible, btu CT on admission and repeat CT after admission no acute finding. - Continue Keppra  per Neuro - cEEG pending - Consult Neurology, appreciate cares   Anemia in chronic kidney disease (CKD) Hgb stable relative to baseline, no bleeding reportd or observed  CKD (chronic kidney disease), stage IV (HCC) Cr stable relative to baseline 1.5-2.0  Obesity, Class III, BMI 40-49.9 (morbid obesity) BMI 52, complicates care  Acute metabolic encephalopathy At baseline has memory problems per family, but is normally oriented, conversant, independent with ADLs, certainly recognizes family.  Has baseline right-sided hemiparesis from old stroke.    Ona dmission, patient noted to have abrupt onset confusion, inability to recognize family, disoriented, could not recall her birthdate.    UA unremarkable, B12, ammonia, RPR, TSH, VBG unremarkable.  This has been slowly resolving since admission.  On my evaluation today  appears completely resolved. - Follow thiamine level - PT/OT  Hypothyroidism TSH normal - Continue levothyroxine   OSA on CPAP - CPAP at night  Cerebrovascular disease Hyperlipidemia Hypertension No evidence of new stroke.  CTH here without new finding.  BP normal - Continue aspirin , Irbesartan, simvastatin    DM2 (diabetes mellitus, type 2) (HCC) Some hyperglycemia noted here - Continue glargine.on SS corrections - Hold Mounjaro           Subjective: Feeling not right and tired' and with right foot pain, on the top of foot, she is unsure when this started.  No fever, no respiratory symptoms, no other focal complaints.  No nursing concerns.  No seizures.     Physical Exam: BP 137/72 (BP Location: Left Wrist)   Pulse 73   Temp 98.9 F (37.2 C) (Oral)   Resp 16   Ht 5' (1.524 m)   Wt 121.4 kg   SpO2 93%   BMI 52.26 kg/m   Obese adult female, lying in bed, appears sleepy but responds to questions, oriented to situation RRR no murmrs, no LE edema Respiratory rate normal, lungs diminshed but clear Abdomen soft, no TTP, no distension or ascites Attentive to questions, oriented to self, [place], [place], and situation.  Short term memory seems intact to me.  No focal weakness, numbnses, or dysarthria.     Data Reviewed: BMP from earlier this week with stable renal function.  CBC today shows stable anemia.  BMP pending  Family Communication:     Disposition: Status is: Inpatient         Author:  Lonni SHAUNNA Dalton, MD 10/25/2023 12:16 PM  For on call review [email or web address].

## 2023-10-25 NOTE — Progress Notes (Signed)
 Subjective: NAEO. No new concerns  ROS: negative except above  Examination  Vital signs in last 24 hours: Temp:  [98 F (36.7 C)-100 F (37.8 C)] 98.7 F (37.1 C) (06/26 0832) Pulse Rate:  [62-77] 72 (06/26 0832) Resp:  [16-18] 16 (06/26 0832) BP: (123-142)/(58-79) 129/69 (06/26 0832) SpO2:  [95 %-100 %] 96 % (06/26 0832) FiO2 (%):  [21 %] 21 % (06/26 0003)  General: lying in bed, NAD Neuro: MS: Alert, oriented, follows commands CN: pupils equal and reactive,  EOMI, face symmetric, tongue midline, normal sensation over face, Motor: 5/5 strength in all 4 extremities Coordination: normal Gait: not tested  Basic Metabolic Panel: Recent Labs  Lab 10/20/23 2047 10/21/23 0438  NA 142 138  K 4.4 4.1  CL 107 110  CO2 23 21*  GLUCOSE 99 82  BUN 30* 28*  CREATININE 1.84* 1.59*  CALCIUM  9.1 8.5*  MG  --  2.0  PHOS  --  3.8    CBC: Recent Labs  Lab 10/20/23 2047 10/21/23 0438 10/25/23 1107  WBC 8.6 8.5 10.0  NEUTROABS 5.0  --   --   HGB 10.3* 9.5* 10.3*  HCT 33.0* 29.8* 32.3*  MCV 84.2 83.2 82.6  PLT 271 258 280     Coagulation Studies: No results for input(s): LABPROT, INR in the last 72 hours.  Imaging No new brain imaging   ASSESSMENT AND PLAN:78yo F with transient ams which is gradually improving   Transient alteration of awareness - ddx include stroke vs seizure vs toxic-metabolic causes vs cognitive impairment  - Unable to get MRI brain due to aneurysmal clips   Recommendations: - LTM eeg due to intermittent alteration of awareness - Continue Keppra  250mg  BID as patient has underlying cortical abnormality and increased risk of seizure - Thiamine replacement empirically - Delirium precautions - Neurology will follow   Thank you for allowing us  to participate in the care of this patient. If you have any further questions, please contact  me or neurohospitalist.   I have spent a total of   35 minutes with the patient reviewing hospital notes,   test results, labs and examining the patient as well as establishing an assessment and plan that was discussed personally with the patient.  > 50% of time was spent in direct patient care.        Arlin Krebs [facility] For questions after 5pm please refer to AMION to reach the Neurologist on call

## 2023-10-25 NOTE — Assessment & Plan Note (Signed)
 Hgb stable relative to baseline, no bleeding reportd or observed

## 2023-10-25 NOTE — Care Management Important Message (Signed)
 Important Message  Patient Details  Name: Jessica  JIMMA Strong MRN: [id number] Date of Birth: [birth year]   Important Message Given:  Yes - Medicare IM     Claretta Deed 10/25/2023, 4:05 PM

## 2023-10-26 ENCOUNTER — Inpatient Hospital Stay (HOSPITAL_COMMUNITY)

## 2023-10-26 ENCOUNTER — Other Ambulatory Visit (HOSPITAL_COMMUNITY): Payer: Self-pay

## 2023-10-26 DIAGNOSIS — R569 Unspecified convulsions: Secondary | ICD-10-CM | POA: Diagnosis not present

## 2023-10-26 LAB — CBC
HCT: 36 % (ref 36.0–46.0)
Hemoglobin: 11.3 g/dL — ABNORMAL LOW (ref 12.0–15.0)
MCH: 26.2 pg (ref 26.0–34.0)
MCHC: 31.4 g/dL (ref 30.0–36.0)
MCV: 83.3 fL (ref 80.0–100.0)
Platelets: 320 10*3/uL (ref 150–400)
RBC: 4.32 MIL/uL (ref 3.87–5.11)
RDW: 14.6 % (ref 11.5–15.5)
WBC: 12.4 10*3/uL — ABNORMAL HIGH (ref 4.0–10.5)
nRBC: 0 % (ref 0.0–0.2)

## 2023-10-26 LAB — GLUCOSE, CAPILLARY
Glucose-Capillary: 197 mg/dL — ABNORMAL HIGH (ref 70–99)
Glucose-Capillary: 211 mg/dL — ABNORMAL HIGH (ref 70–99)
Glucose-Capillary: 250 mg/dL — ABNORMAL HIGH (ref 70–99)

## 2023-10-26 LAB — COMPREHENSIVE METABOLIC PANEL WITH GFR
ALT: 13 U/L (ref 0–44)
AST: 18 U/L (ref 15–41)
Albumin: 3.2 g/dL — ABNORMAL LOW (ref 3.5–5.0)
Alkaline Phosphatase: 91 U/L (ref 38–126)
Anion gap: 14 (ref 5–15)
BUN: 34 mg/dL — ABNORMAL HIGH (ref 8–23)
CO2: 19 mmol/L — ABNORMAL LOW (ref 22–32)
Calcium: 9.1 mg/dL (ref 8.9–10.3)
Chloride: 105 mmol/L (ref 98–111)
Creatinine, Ser: 2.15 mg/dL — ABNORMAL HIGH (ref 0.44–1.00)
GFR, Estimated: 23 mL/min — ABNORMAL LOW (ref >60)
Glucose, Bld: 203 mg/dL — ABNORMAL HIGH (ref 70–99)
Potassium: 4.7 mmol/L (ref 3.5–5.1)
Sodium: 138 mmol/L (ref 135–145)
Total Bilirubin: 0.2 mg/dL (ref 0.0–1.2)
Total Protein: 6.6 g/dL (ref 6.5–8.1)

## 2023-10-26 MED ORDER — VITAMIN B-1 100 MG PO TABS
100.0000 mg | ORAL_TABLET | Freq: Every day | ORAL | Status: AC
Start: 2023-10-26 — End: ?

## 2023-10-26 MED ORDER — THIAMINE MONONITRATE 100 MG PO TABS
100.0000 mg | ORAL_TABLET | Freq: Every day | ORAL | Status: DC
  Administered 2023-10-26: 100 mg via ORAL
  Filled 2023-10-26: qty 1

## 2023-10-26 MED ORDER — LEVETIRACETAM 250 MG PO TABS
250.0000 mg | ORAL_TABLET | Freq: Two times a day (BID) | ORAL | 0 refills | Status: DC
  Filled 2023-10-26: qty 180, 90d supply, fill #0

## 2023-10-26 NOTE — Discharge Summary (Signed)
 Physician Discharge Summary   Patient: Jessica  AVIANA Strong MRN: [id number] DOB: 17-Jul-1945  Admit date:     10/20/2023  Discharge date: 10/26/23  Discharge Physician: Lonni SHAUNNA Dalton   PCP: Zarwolo, Gloria, FNP     Recommendations at discharge:  Follow up with PCP Gloria Zarwolo for seizure and suspected early dementia Follow up with Neurology for seizure and suspected early dementia, referral sent Gloria Zarwolo: Please obtain CBC and BMP in 1 week Please follow up thiamine  level     Discharge Diagnoses: Principal Problem:   Seizure-like activity (HCC) Active Problems:   DM2 (diabetes mellitus, type 2) (HCC)   Cerebrovascular disease   Essential hypertension   OSA on CPAP   Hypothyroidism   Hyperlipidemia   Acute metabolic encephalopathy   Obesity, Class III, BMI 40-49.9 (morbid obesity)   CKD (chronic kidney disease), stage IV (HCC)   Anemia in chronic kidney disease (CKD)      Hospital Course: 78 y.o. F with MO, hx Roux-en-Y, hx CVA residual R hemiparesis, hx cerebral aneurysm s/p clip 2007, DM, HTN, hypothyroidism, OSA and CKD IV baseline 1.7-2 who presented with confusion.   Neurology was consulted.  Metabolic work up has been negative.  20 min EEG was neg for seizure.  Repeat CT brain was neg.  Mental status slowly improving, but pt not back to baseline.  As a result, neurology requested transfer to [facility] for LTM.      * Seizure-like activity (HCC) Prior to admission, family noted possible eyelid twitching bilaterally.  Later her husband noted a 'twisting' of her face describes her jaw pulling to the left side.  No seizure activity noted since arrival to the hospital.   Spot EEG here negative.  Started on Keppra  on presentation.  Aneurysm clips not MRI compatible, but CT on admission and repeat CT after admission showed no acute finding.  Neurology consulted, patient placed on LTM EEG.  EEG showed no evidence of seizure, but given clinical description of  event and underlying cortical abnormality, patient started on Keppra .  Neurology referral sent, follow up in 3-6 weeks    Acute metabolic encephalopathy At baseline has memory problems per family, but is normally oriented, conversant, independent with ADLs, certainly recognizes family.  Has baseline right-sided hemiparesis from old stroke.    Ona dmission, patient noted to have abrupt onset confusion, inability to recognize family, disoriented, could not recall her birthdate.    UA unremarkable, B12, ammonia, RPR, TSH, VBG unremarkable.  She was treated with empiric high dose thiamine .  Level pending at discharge.    Mentation returned to normal.  Suspect some incipient dementia, referral to Neurology made.   Hypothyroidism TSH normal on LT4   Anemia in chronic kidney disease (CKD) Hgb stable relative to baseline, no bleeding reportd or observed  CKD (chronic kidney disease), stage IV (HCC) Cr stable relative to baseline 1.5-2.0  Obesity, Class III, BMI 40-49.9 (morbid obesity) BMI 52, complicates care  OSA on CPAP On CPAP  Cerebrovascular disease Hyperlipidemia Hypertension No evidence of new stroke.  CTH here without new finding.   BP normal   DM2 (diabetes mellitus, type 2) (HCC) Transient hyperglycemia.  No change to meds at discharge.            The [place]  Controlled Substances Registry was reviewed for this patient prior to discharge.   Consultants: Neurololgy Procedures performed: LTM EEG  Disposition: Home health Diet recommendation:  Discharge Diet Orders (From admission, onward)     Start  Ordered   10/26/23 0000  Diet - low sodium heart healthy        10/26/23 1437             DISCHARGE MEDICATION: Allergies as of 10/26/2023       Reactions   Chocolate Other (See Comments)   Pt and daughter report it causes dark discoloration on elbows.    Penicillins Rash   Has patient had a PCN reaction causing immediate rash,  facial/tongue/throat swelling, SOB or lightheadedness with hypotension: Yes Has patient had a PCN reaction causing severe rash involving mucus membranes or skin necrosis: No Has patient had a PCN reaction that required hospitalization No Has patient had a PCN reaction occurring within the last 10 years: No. Has not had in 40-50 years If all of the above answers are NO, then may proceed with Cephalosporin use.        Medication List     STOP taking these medications    bisacodyl 5 MG EC tablet Commonly known as: DULCOLAX   CALCIUM  600-D PO   ferrous sulfate 325 (65 FE) MG tablet       TAKE these medications    Accu-Chek Aviva Plus test strip Generic drug: glucose blood Inject 1 strip as directed daily.   acetaminophen  500 MG tablet Commonly known as: TYLENOL  Take 500 mg by mouth every 6 (six) hours as needed for mild pain or headache. As needed.   albuterol  108 (90 Base) MCG/ACT inhaler Commonly known as: VENTOLIN  HFA Inhale 2 puffs into the lungs every 6 (six) hours as needed for wheezing or shortness of breath.   aspirin  325 MG tablet Take 325 mg by mouth daily.   calcitRIOL 0.25 MCG capsule Commonly known as: ROCALTROL Take 0.25 mcg by mouth daily.   Dexcom G7 Sensor Misc by Does not apply route.   EPINEPHrine 0.3 mg/0.3 mL Soaj injection Commonly known as: EPI-PEN Inject 0.3 mg into the muscle as needed for anaphylaxis.   furosemide  20 MG tablet Commonly known as: LASIX  TAKE 1 TABLET BY MOUTH DAILY.  (MAY TAKE EXTRA 1 TABLET AS  NEEDED FOR SEVERE SWELLING)   Gemtesa  75 MG Tabs Generic drug: Vibegron  Take 1 tablet (75 mg total) by mouth daily.   Lancets Thin Misc Inject 1 Stick into the skin daily.   levETIRAcetam  250 MG tablet Commonly known as: KEPPRA  Take 1 tablet (250 mg total) by mouth 2 (two) times daily.   levothyroxine  88 MCG tablet Commonly known as: SYNTHROID  Take 1 tablet (88 mcg total) by mouth daily before breakfast.    Multivitamin Adult Chew Chew 1 tablet by mouth daily.   Novofine Pen Needle 32G X 6 MM Misc Generic drug: Insulin  Pen Needle Use to inject insulin  4 times daily   olmesartan  20 MG tablet Commonly known as: BENICAR  TAKE 1 TABLET BY MOUTH DAILY   omeprazole  40 MG capsule Commonly known as: PRILOSEC Take 1 capsule (40 mg total) by mouth daily. What changed:  when to take this reasons to take this   potassium chloride  10 MEQ tablet Commonly known as: KLOR-CON  M TAKE 1 TABLET BY MOUTH DAILY What changed: how much to take   simvastatin  20 MG tablet Commonly known as: ZOCOR  Take 1 tablet (20 mg total) by mouth at bedtime.   terbinafine  1 % cream Commonly known as: LamISIL  AT Apply 1 Application topically 2 (two) times daily.   thiamine  100 MG tablet Commonly known as: Vitamin B-1 Take 1 tablet (100 mg total) by  mouth daily.   tirzepatide  15 MG/0.5ML Pen Commonly known as: MOUNJARO  Inject 15 mg into the skin once a week. What changed: when to take this   Tresiba  FlexTouch 200 UNIT/ML FlexTouch Pen Generic drug: insulin  degludec Inject 66 Units into the skin daily at 10 pm. What changed:  how much to take when to take this        Follow-up Information     [facility] Follow up.   Why: [name] will be contacting you within 2 days of your discharge to discuss home physical therapy. If you do not hear from [facility], call [phone]. Contact information: [address] [place] [address] [phone]         Zarwolo, Gloria, FNP Follow up.   Specialty: Family Medicine Contact information: [address] [name] [address] [phone]         [facility] Follow up.   Contact information: [address] [place], [address] [place]  [address] [phone]                Discharge Instructions     Ambulatory referral to Neurology   Complete by: As directed     An appointment is requested in approximately: 8 weeks Possible seizure Possible dementia   Diet - low sodium heart healthy   Complete by: As directed    Discharge instructions   Complete by: As directed    **IMPORTANT DISCHARGE INSTRUCTIONS**   From Dr. Jonel: You were admitted for confusion  Here, you were evaluated by our neurologists, who ruled out stroke, brain bleeding.  They feel that you may have some early dementia.  They also feel that you may have had a small seizure.  You were monitored for seizures here for 36 hours and had none. This doesn't prove you DIDN'T have a seizure though, and your description sounds convincingly like a seizure.  You should start the seizure medicine Keppra /levetiracetam   Take levetiracetam /Keppra  250 mg twice daily Go see [facility] in 6-8 weeks for follow up They should evaluate you for epilepsy and also for cognitive testing for memory loss  Resume your other home medicines without change  Take thiamine  supplements for 6 weeks Have the Neurologist follow up your thiamine  level   Increase activity slowly   Complete by: As directed        Discharge Exam: Filed Weights   10/20/23 1944 10/20/23 2335  Weight: 121.8 kg 121.4 kg    General: Pt is alert, awake, not in acute distress, sitting up in recliner, interactive, appropriate Cardiovascular: RRR, nl S1-S2, no murmurs appreciated.   No LE edema.   Respiratory: Normal respiratory rate and rhythm.  CTAB without rales or wheezes. Abdominal: Abdomen soft and non-tender.  No distension or HSM.   MSK: Feet appear normal Neuro/Psych: Strength symmetric in upper and lower extremities.  Judgment and insight appear normal.   Condition at discharge: fair  The results of significant diagnostics from this hospitalization (including imaging, microbiology, ancillary and laboratory) are listed below for reference.   Imaging Studies: Overnight EEG with video Result Date:  10/26/2023 Shelton Arlin KIDD, MD     10/26/2023  8:32 AM Patient Name: Jessica  SORIYA Strong MRN: [id number] Epilepsy Attending: Arlin KIDD Shelton Referring Physician/Provider: Shelton Arlin KIDD, MD Duration: 10/25/2023 [phone] to 10/26/2023 0830  Patient history: 78yo F with ams. EEG to evaluate for seizure  Level of alertness: Awake, asleep  AEDs during EEG study: LEV  Technical aspects: This EEG study was done with scalp electrodes positioned according to the 10-20 International system of electrode placement. Electrical activity was reviewed with band pass filter of 1-70Hz , sensitivity of 7 uV/mm, display speed of 36mm/sec with a 60Hz  notched filter applied as appropriate. EEG data were recorded continuously and digitally stored.  Video monitoring was available and reviewed as appropriate.  Description: The posterior dominant rhythm consists of 9-10 Hz activity of moderate voltage (25-35 uV) seen predominantly in posterior head regions, symmetric and reactive to eye opening and eye closing. Sleep was characterized by vertex waves, sleep spindles (12 to 14 Hz), maximal frontocentral region. Hyperventilation and photic stimulation were not performed.   EEG was disconnected between 10/25/2023 1035 to 1412 due to technical issues  IMPRESSION: This study is within normal limits. No seizures or epileptiform discharges were seen throughout the recording.  A normal interictal EEG does not exclude the diagnosis of epilepsy.  Arlin MALVA Krebs    DG Foot Complete Right Result Date: 10/25/2023 CLINICAL DATA:  Right foot pain. EXAM: RIGHT FOOT COMPLETE - 3+ VIEW COMPARISON:  None Available. FINDINGS: No acute fracture or dislocation. The bones are osteopenic. The soft tissues are grossly unremarkable. IMPRESSION: 1. No acute fracture or dislocation. 2. Osteopenia. Electronically Signed   By: Vanetta Chou M.D.   On: 10/25/2023 14:39   EEG adult Result Date: 10/23/2023 Krebs Arlin MALVA, MD     10/23/2023  4:10 PM Patient Name:  Jessica  KINZE Strong MRN: [id number] Epilepsy Attending: Arlin MALVA Krebs Referring Physician/Provider: Vicci Afton CROME, MD Date: 10/23/2023 Duration: 22.18 mins Patient history: 78yo F with ams. EEG to evaluate for seizure Level of alertness: Awake, asleep AEDs during EEG study: LEV Technical aspects: This EEG study was done with scalp electrodes positioned according to the 10-20 International system of electrode placement. Electrical activity was reviewed with band pass filter of 1-70Hz , sensitivity of 7 uV/mm, display speed of 32mm/sec with a 60Hz  notched filter applied as appropriate. EEG data were recorded continuously and digitally stored.  Video monitoring was available and reviewed as appropriate. Description: The posterior dominant rhythm consists of 9-10 Hz activity of moderate voltage (25-35 uV) seen predominantly in posterior head regions, symmetric and reactive to eye opening and eye closing. Sleep was characterized by vertex waves, sleep spindles (12 to 14 Hz), maximal frontocentral region. Hyperventilation and photic stimulation were not performed.   IMPRESSION: This study is within normal limits. No seizures or epileptiform discharges were seen throughout the recording. A normal interictal EEG does not exclude the diagnosis of epilepsy. Arlin MALVA Krebs   MM 3D SCREENING MAMMOGRAM BILATERAL BREAST Result Date: 10/23/2023 CLINICAL DATA:  Screening. EXAM: DIGITAL SCREENING BILATERAL MAMMOGRAM WITH TOMOSYNTHESIS AND CAD TECHNIQUE: Bilateral screening digital craniocaudal and mediolateral oblique mammograms were obtained. Bilateral screening digital breast tomosynthesis was performed. The images were evaluated with computer-aided detection. COMPARISON:  Previous exam(s). ACR Breast Density Category b: There are scattered areas of fibroglandular density. FINDINGS: There are no findings suspicious for malignancy. IMPRESSION: No mammographic evidence of malignancy. A result letter of this screening  mammogram will be mailed directly to the patient. RECOMMENDATION: Screening mammogram in one year. (Code:SM-B-01Y) BI-RADS CATEGORY  1: Negative. Electronically Signed   By: Toribio Agreste M.D.   On: 10/23/2023 13:35   CT HEAD WO CONTRAST ( ) Result Date: 10/21/2023 CLINICAL DATA:  Stroke follow-up EXAM: CT HEAD WITHOUT CONTRAST TECHNIQUE: Contiguous axial images were obtained from the base of the skull through the vertex without intravenous contrast.  RADIATION DOSE REDUCTION: This exam was performed according to the departmental dose-optimization program which includes automated exposure control, adjustment of the mA and/or kV according to patient size and/or use of iterative reconstruction technique. COMPARISON:  10/20/2023 FINDINGS: Brain: No mass, hemorrhage or extra-axial collection. Unchanged appearance of left frontal and temporal encephalomalacia and multiple old cerebellar infarcts. Vascular: Aneurysm clips at the left carotid terminus. Skull: Old left pterional craniotomy Sinuses/Orbits: No fluid levels or advanced mucosal thickening of the visualized paranasal sinuses. No mastoid or middle ear effusion. Normal orbits. Other: None. IMPRESSION: 1. No acute intracranial abnormality. 2. Unchanged appearance of left frontal and temporal encephalomalacia and multiple old cerebellar infarcts. Electronically Signed   By: Franky Stanford M.D.   On: 10/21/2023 20:45   DG Chest 1 View Result Date: 10/20/2023 CLINICAL DATA:  Altered mental status EXAM: CHEST  1 VIEW COMPARISON:  Chest x-ray 08/10/2023 FINDINGS: The heart size and mediastinal contours are within normal limits. Both lungs are clear. The visualized skeletal structures are unremarkable. IMPRESSION: No active disease. Electronically Signed   By: Greig Pique M.D.   On: 10/20/2023 20:28   CT Head Wo Contrast Result Date: 10/20/2023 CLINICAL DATA:  Altered mental status EXAM: CT HEAD WITHOUT CONTRAST TECHNIQUE: Contiguous axial images were obtained  from the base of the skull through the vertex without intravenous contrast. RADIATION DOSE REDUCTION: This exam was performed according to the departmental dose-optimization program which includes automated exposure control, adjustment of the mA and/or kV according to patient size and/or use of iterative reconstruction technique. COMPARISON:  None Available. FINDINGS: Brain: Left frontal and temporal lobe encephalomalacia. Old bilateral cerebellar infarcts and old anterior left thalamic infarct. No mass, hemorrhage or extra-axial collection. 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 Skull: Remote left pterional craniotomy. Sinuses/Orbits: No fluid levels or advanced mucosal thickening of the visualized paranasal sinuses. No mastoid or middle ear effusion. Normal orbits. Other: None. IMPRESSION: 1. No acute intracranial abnormality. 2. Left frontal and temporal lobe encephalomalacia. 3. Old bilateral cerebellar infarcts and old anterior left thalamic infarct. Electronically Signed   By: Franky Stanford M.D.   On: 10/20/2023 20:18    Microbiology: Results for orders placed or performed in visit on 10/03/21  Microscopic Examination     Status: None   Collection Time: 10/03/21  2:21 PM   Urine  Result Value Ref Range Status   WBC, UA 0-5 0 - 5 /hpf Final   RBC, Urine 0-2 0 - 2 /hpf Final   Epithelial Cells (non renal) 0-10 0 - 10 /hpf Final   Renal Epithel, UA None seen None seen /hpf Final   Bacteria, UA Few None seen/Few Final    Labs: CBC: Recent Labs  Lab 10/20/23 2047 10/21/23 0438 10/25/23 1107 10/26/23 0423  WBC 8.6 8.5 10.0 12.4*  NEUTROABS 5.0  --   --   --   HGB 10.3* 9.5* 10.3* 11.3*  HCT 33.0* 29.8* 32.3* 36.0  MCV 84.2 83.2 82.6 83.3  PLT 271 258 280 320   Basic Metabolic Panel: Recent Labs  Lab 10/20/23 2047 10/21/23 0438 10/25/23 1107 10/26/23 0423  NA 142 138 137 138  K 4.4 4.1 4.4 4.7  CL 107 110 107 105  CO2 23 21* 21* 19*  GLUCOSE 99 82 215* 203*   BUN 30* 28* 28* 34*  CREATININE 1.84* 1.59* 1.89* 2.15*  CALCIUM  9.1 8.5* 9.1 9.1  MG  --  2.0  --   --   PHOS  --  3.8  --   --    Liver Function Tests: Recent Labs  Lab 10/20/23 2047 10/25/23 1107 10/26/23 0423  AST 19 20 18   ALT 16 14 13   ALKPHOS 107 82 91  BILITOT 0.5 0.3 0.2  PROT 6.7 6.1* 6.6  ALBUMIN  3.5 3.0* 3.2*   CBG: Recent Labs  Lab 10/25/23 2013 10/25/23 2256 10/26/23 0401 10/26/23 0749 10/26/23 1232  GLUCAP 220* 199* 197* 211* 250*    Discharge time spent: approximately 45 minutes spent on discharge counseling, evaluation of patient on day of discharge, and coordination of discharge planning with nursing, social work, pharmacy and case management  Signed: Lonni SHAUNNA Dalton, MD [facility] 10/26/2023

## 2023-10-26 NOTE — TOC Progression Note (Addendum)
 Transition of Care (TOC) - Progression Note   Patient discharging today. See previous TOC note. Called [name] [phone] left a message awaiting call back.   Will need to fax discharge summary once completed. Fax [phone]    Reached [name] at [name]. She confirmed they have accepted patient. Discharge summary will need to be faxed once completed.   1349 [name] with [name] aware PT to see patient again prior to discharge. When referral was accepted it was for HHPT only , not HHPT and HHOT . They currently do not have HHOT . Secure chatted attending . MD aware. Patient currently working with patient and MD believes she will go home today. [name] aware and has EPIC so she will pull DC summary when completed.  Patient Details  Name: Jessica Strong MRN: [id number] Date of Birth: 11-Jan-1946  Transition of Care [facility] CM/SW Contact  [name], RN Phone Number: 10/26/2023, 9:42 AM  Clinical Narrative:         Barriers to Discharge: Other (must enter comment) (DC to [facility] Today)  Expected Discharge Plan and Services         Expected Discharge Date: 10/26/23               DME Arranged: N/A DME Agency: NA       HH Arranged: PT HH Agency: [name] Date [name] Agency Contacted: 10/22/23   Representative spoke with at [facility] Agency: [facility] agency   Social Determinants of Health (SDOH) Interventions SDOH Screenings   Food Insecurity: No Food Insecurity (10/21/2023)  Housing: Low Risk  (10/21/2023)  Transportation Needs: No Transportation Needs (10/21/2023)  Utilities: Not At Risk (10/21/2023)  Alcohol  Screen: Low Risk  (11/30/2022)  Depression (PHQ2-9): High Risk (10/11/2023)  Financial Resource Strain: Low Risk  (11/30/2022)  Physical Activity: Inactive (11/30/2022)  Social Connections: Moderately Isolated (10/21/2023)  Stress: No Stress Concern Present (11/30/2022)  Tobacco Use: Low Risk  (10/20/2023)  Health  Literacy: Adequate Health Literacy (11/30/2022)    Readmission Risk Interventions    10/24/2023   10:48 AM 10/23/2023   11:26 AM 10/21/2023    4:08 PM  Readmission Risk Prevention Plan  Post Dischage Appt   Complete  Medication Screening Complete  Complete  Transportation Screening Complete Complete Complete  Home Care Screening  Complete   Medication Review (RN CM)  Complete

## 2023-10-26 NOTE — Plan of Care (Signed)

## 2023-10-26 NOTE — Progress Notes (Signed)
MB performed maintenance on electrodes. All impedances are below 10k ohms. No skin breakdown noted.  

## 2023-10-26 NOTE — Progress Notes (Signed)
 Occupational Therapy Treatment Patient Details Name: Rosalba  ZIAN MOHAMED MRN: [id number] DOB: 1946/04/22 Today's Date: 10/26/2023   History of present illness Dyamond  MAKALIA BARE is a 78 y.o. female who presents with AMS.  hx of prior CVA with R hemiparesis, MI, remote aneurysm clip ' 07, CKD stage III, diabetes type 2, hypertension, hypothyroidism, morbid obesity, Roux-en-Y gastric bypass, OSA, mood d/o   OT comments  Pt c/o L foot today which is evidently impacting overall balance and functional performance. Worked with pt and her spouse to ensure that he can provide level of assist needed at home. Pt needing min A to CGA for STS assist and mod A to setup A for ADLs. Pt's husband demonstrated ability to provide level of support needed at home. Provided pt with gait belt to use to aide pt in transfers. OT to continue following pt while in acute stay to promote independence. Patient would benefit from post acute Home OT services to help maximize functional independence in natural environment       If plan is discharge home, recommend the following:  A little help with walking and/or transfers;A little help with bathing/dressing/bathroom;Assistance with cooking/housework   Equipment Recommendations  Tub/shower seat    Recommendations for Other Services      Precautions / Restrictions Precautions Precautions: Fall Restrictions Weight Bearing Restrictions Per Provider Order: No       Mobility Bed Mobility Overal bed mobility: Needs Assistance Bed Mobility: Supine to Sit, Sit to Supine     Supine to sit: Min assist, Used rails, HOB elevated Sit to supine: Min assist, Used rails, HOB elevated   General bed mobility comments: Monitored pt and husband during bed mobility to ensure he can provide needed assist.    Transfers Overall transfer level: Needs assistance Equipment used: Rolling walker (2 wheels) Transfers: Sit to/from Stand Sit to Stand: Contact guard assist            General transfer comment: STS from recliner CGA, STS from EOB with spouse assisting minA. Educated pt spouse on use of gait belt and safest handling technique to reduce risk of injury.     Balance Overall balance assessment: Needs assistance Sitting-balance support: Feet supported, No upper extremity supported Sitting balance-Leahy Scale: Good Sitting balance - Comments: seated at EOB   Standing balance support: Bilateral upper extremity supported, Reliant on assistive device for balance, During functional activity Standing balance-Leahy Scale: Poor Standing balance comment: RW support.                           ADL either performed or assessed with clinical judgement   ADL Overall ADL's : Needs assistance/impaired     Grooming: Standing;Contact guard assist   Upper Body Bathing: Sitting;Set up Upper Body Bathing Details (indicate cue type and reason): discussed with pt/family a shower chair to use at home. Lower Body Bathing: Sitting/lateral leans;Moderate assistance Lower Body Bathing Details (indicate cue type and reason): husband can provide assist. Upper Body Dressing : Sitting;Minimal assistance   Lower Body Dressing: Sit to/from stand;Moderate assistance Lower Body Dressing Details (indicate cue type and reason): husband demonstrated ability to assist. Toilet Transfer: Contact guard assist;Rolling walker (2 wheels)   Toileting- Clothing Manipulation and Hygiene: Sitting/lateral lean;Contact guard assist       Functional mobility during ADLs: Contact guard assist;Rolling walker (2 wheels) General ADL Comments: Pt slowly ambulating in room but CGA. increased time to process commands.    Extremity/Trunk Assessment Upper Extremity  Assessment Upper Extremity Assessment: Overall WFL for tasks assessed   Lower Extremity Assessment Lower Extremity Assessment: Generalized weakness;LLE deficits/detail LLE Deficits / Details: pain top of foot   Cervical / Trunk  Assessment Cervical / Trunk Assessment: Normal    Vision   Vision Assessment?: No apparent visual deficits   Perception     Praxis     Communication Communication Communication: No apparent difficulties   Cognition Arousal: Alert Behavior During Therapy: WFL for tasks assessed/performed Cognition: Difficult to assess Difficult to assess due to:  (pt mostly not as verbal)           OT - Cognition Comments: follows simple commands, increased time to process                 Following commands: Intact        Cueing   Cueing Techniques: Verbal cues, Tactile cues  Exercises      Shoulder Instructions       General Comments Pt spouse present, demonstrated good understanding of education. Issued gait belt.    Pertinent Vitals/ Pain       Pain Assessment Pain Assessment: 0-10 Faces Pain Scale: Hurts whole lot Pain Location: L foot, top Pain Descriptors / Indicators: Aching, Grimacing, Guarding Pain Intervention(s): Limited activity within patient's tolerance, Monitored during session  Home Living                                          Prior Functioning/Environment              Frequency  Min 1X/week        Progress Toward Goals  OT Goals(current goals can now be found in the care plan section)  Progress towards OT goals: Progressing toward goals  Acute Rehab OT Goals Patient Stated Goal: To be independent OT Goal Formulation: With patient Time For Goal Achievement: 11/06/23 Potential to Achieve Goals: Good  Plan      Co-evaluation                 AM-PAC OT 6 Clicks Daily Activity     Outcome Measure   Help from another person eating meals?: None Help from another person taking care of personal grooming?: A Little Help from another person toileting, which includes using toliet, bedpan, or urinal?: A Little Help from another person bathing (including washing, rinsing, drying)?: A Lot Help from another  person to put on and taking off regular upper body clothing?: A Little Help from another person to put on and taking off regular lower body clothing?: A Lot 6 Click Score: 17    End of Session Equipment Utilized During Treatment: Rolling walker (2 wheels);Gait belt  OT Visit Diagnosis: Muscle weakness (generalized) (M62.81);Unsteadiness on feet (R26.81);Pain Pain - Right/Left: Left Pain - part of body: Ankle and joints of foot   Activity Tolerance Patient tolerated treatment well   Patient Left in bed;with call bell/phone within reach;with family/visitor present   Nurse Communication Mobility status        Time: 8599-8578 OT Time Calculation (min): 21 min  Charges: OT General Charges $OT Visit: 1 Visit OT Treatments $Therapeutic Activity: 8-22 mins  10/26/2023  AB, OTR/L  Acute Rehabilitation Services  Office: [phone]   Curtistine JONETTA Das 10/26/2023, 3:02 PM

## 2023-10-26 NOTE — Procedures (Signed)
 Patient Name: Jessica Strong  MRN: [id number]  Epilepsy Attending: Arlin MALVA Krebs  Referring Physician/Provider: Krebs Arlin MALVA, MD  Duration: 10/25/2023 [id number] to 10/26/2023 1004   Patient history: 78yo F with ams. EEG to evaluate for seizure   Level of alertness: Awake, asleep   AEDs during EEG study: LEV   Technical aspects: This EEG study was done with scalp electrodes positioned according to the 10-20 International system of electrode placement. Electrical activity was reviewed with band pass filter of 1-70Hz , sensitivity of 7 uV/mm, display speed of 33mm/sec with a 60Hz  notched filter applied as appropriate. EEG data were recorded continuously and digitally stored.  Video monitoring was available and reviewed as appropriate.   Description: The posterior dominant rhythm consists of 9-10 Hz activity of moderate voltage (25-35 uV) seen predominantly in posterior head regions, symmetric and reactive to eye opening and eye closing. Sleep was characterized by vertex waves, sleep spindles (12 to 14 Hz), maximal frontocentral region. Hyperventilation and photic stimulation were not performed.     EEG was disconnected between 10/25/2023 1035 to 1412 due to technical issues   IMPRESSION: This study is within normal limits. No seizures or epileptiform discharges were seen throughout the recording.   A normal interictal EEG does not exclude the diagnosis of epilepsy.   Tregan Read O Lorry Furber

## 2023-10-26 NOTE — Progress Notes (Signed)
 Physical Therapy Treatment Patient Details Name: Jessica Strong MRN: [id number] DOB: [birth year] Today's Date: 10/26/2023   History of Present Illness Jessica  [name] is a 78 y.o. female who presents with AMS.  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 Pt reports R foot feels better today, however L foot has the same pain now. Pt able to sit to EOB and bed linens noted to be soiled. Pt able to stand and requires total A for changing briefs. Pt requires mod-max to stand and min A to pivot to the recliner with increased time to complete all tasks due to increased pain. Pt continues to benefit from PT services to progress toward functional mobility goals.     If plan is discharge home, recommend the following: A little help with walking and/or transfers;Help with stairs or ramp for entrance;A little help with bathing/dressing/bathroom;Assistance with cooking/housework   Can travel by private vehicle        Equipment Recommendations  None recommended by PT    Recommendations for Other Services       Precautions / Restrictions Precautions Precautions: Fall Restrictions Weight Bearing Restrictions Per Provider Order: No     Mobility  Bed Mobility Overal bed mobility: Needs Assistance Bed Mobility: Supine to Sit     Supine to sit: Min assist, Used rails, HOB elevated     General bed mobility comments: increased time and min A for trunk elevation    Transfers Overall transfer level: Needs assistance Equipment used: Rolling walker (2 wheels) Transfers: Sit to/from Stand Sit to Stand: Mod assist, Max assist   Step pivot transfers: Min assist       General transfer comment: From EOB x2 and recliner x1 with max A progressing to mod A for power up and anterior weight shift.  Increased time and min A to step to recliner with min A for weight shifting and RW management. Cues for increased UE support on RW to offload LLE    Ambulation/Gait               General Gait Details: unable   Stairs             Wheelchair Mobility     Tilt Bed    Modified Rankin (Stroke Patients Only)       Balance Overall balance assessment: Needs assistance Sitting-balance support: Feet supported, No upper extremity supported Sitting balance-Leahy Scale: Good Sitting balance - Comments: seated at EOB   Standing balance support: Bilateral upper extremity supported, Reliant on assistive device for balance, During functional activity Standing balance-Leahy Scale: Poor Standing balance comment: RW support to offload LLE                            Communication Communication Communication: No apparent difficulties  Cognition Arousal: Alert Behavior During Therapy: WFL for tasks assessed/performed   PT - Cognitive impairments: No family/caregiver present to determine baseline                         Following commands: Intact      Cueing Cueing Techniques: Verbal cues, Tactile cues  Exercises      General Comments        Pertinent Vitals/Pain Pain Assessment  Pain Assessment: Faces Faces Pain Scale: Hurts whole lot Pain Location: L foot Pain Intervention(s): Limited activity within patient's tolerance, Monitored during session     PT Goals (current goals can now be found in the care plan section) Acute Rehab PT Goals Patient Stated Goal: return home with family to assist PT Goal Formulation: With patient/family Time For Goal Achievement: 10/26/23 Progress towards PT goals: Progressing toward goals (slowly)    Frequency    Min 3X/week       AM-PAC PT 6 Clicks Mobility   Outcome Measure  Help needed turning from your back to your side while in a flat bed without using bedrails?: A Little Help needed moving  from lying on your back to sitting on the side of a flat bed without using bedrails?: A Little Help needed moving to and from a bed to a chair (including a wheelchair)?: A Little Help needed standing up from a chair using your arms (e.g., wheelchair or bedside chair)?: A Lot Help needed to walk in hospital room?: Total Help needed climbing 3-5 steps with a railing? : Total 6 Click Score: 13    End of Session Equipment Utilized During Treatment: Gait belt Activity Tolerance: Patient tolerated treatment well;Patient limited by pain Patient left: in chair;with call bell/phone within reach Nurse Communication: Mobility status PT Visit Diagnosis: Unsteadiness on feet (R26.81);Other abnormalities of gait and mobility (R26.89)     Time: 9165-9090 PT Time Calculation (min) (ACUTE ONLY): 35 min  Charges:    $Therapeutic Activity: 23-37 mins PT General Charges $$ ACUTE PT VISIT: 1 Visit                     Darryle George, PTA Acute Rehabilitation Services Secure Chat Preferred  [phone]    Darryle George 10/26/2023, 10:41 AM

## 2023-10-26 NOTE — Progress Notes (Signed)
 Subjective: NAEO. No new concerns. Requesting to go home.  ROS: negative except above  Examination  Vital signs in last 24 hours: Temp:  [98 F (36.7 C)-99.3 F (37.4 C)] 99.1 F (37.3 C) (06/27 0748) Pulse Rate:  [70-82] 82 (06/27 0748) Resp:  [16-18] 18 (06/27 0402) BP: (127-156)/(55-77) 133/67 (06/27 0748) SpO2:  [93 %-100 %] 96 % (06/27 0748) FiO2 (%):  [21 %] 21 % (06/26 2308)  General: lying in bed, NAD Neuro: MS: Alert, oriented, follows commands CN: pupils equal and reactive,  EOMI, face symmetric, tongue midline, normal sensation over face, Motor: 5/5 strength in all 4 extremities Coordination: normal Gait: not tested  Basic Metabolic Panel: Recent Labs  Lab 10/20/23 2047 10/21/23 0438 10/25/23 1107 10/26/23 0423  NA 142 138 137 138  K 4.4 4.1 4.4 4.7  CL 107 110 107 105  CO2 23 21* 21* 19*  GLUCOSE 99 82 215* 203*  BUN 30* 28* 28* 34*  CREATININE 1.84* 1.59* 1.89* 2.15*  CALCIUM  9.1 8.5* 9.1 9.1  MG  --  2.0  --   --   PHOS  --  3.8  --   --     CBC: Recent Labs  Lab 10/20/23 2047 10/21/23 0438 10/25/23 1107 10/26/23 0423  WBC 8.6 8.5 10.0 12.4*  NEUTROABS 5.0  --   --   --   HGB 10.3* 9.5* 10.3* 11.3*  HCT 33.0* 29.8* 32.3* 36.0  MCV 84.2 83.2 82.6 83.3  PLT 271 258 280 320     Coagulation Studies: No results for input(s): LABPROT, INR in the last 72 hours.  Imaging No new brain imaging     ASSESSMENT AND PLAN:78yo F with transient ams which is gradually improving   Transient alteration of awareness - ddx include stroke vs seizure vs toxic-metabolic causes vs cognitive impairment  - Unable to get MRI brain due to aneurysmal clips   Recommendations: - DC LTM eeg as no seizures - Continue Keppra  250mg  BID as patient has underlying cortical abnormality and increased risk of seizure - Thiamine  replacement empirically - Delirium precautions - F/u with Neurology in 3-6 weeks  Seizure precautions: Per [place]  DMV  statutes, patients with seizures are not allowed to drive until they have been seizure-free for six months and cleared by a physician    Use caution when using heavy equipment or power tools. Avoid working on ladders or at heights. Take showers instead of baths. Ensure the water temperature is not too high on the home water heater. Do not go swimming alone. Do not lock yourself in a room alone (i.e. bathroom). When caring for infants or small children, sit down when holding, feeding, or changing them to minimize risk of injury to the child in the event you have a seizure. Maintain good sleep hygiene. Avoid alcohol .    If patient has another seizure, call 911 and bring them back to the ED if: A.  The seizure lasts longer than 5 minutes.      B.  The patient doesn't wake shortly after the seizure or has new problems such as difficulty seeing, speaking or moving following the seizure C.  The patient was injured during the seizure D.  The patient has a temperature over 102 F (39C) E.  The patient vomited during the seizure and now is having trouble breathing    During the Seizure   - First, ensure adequate ventilation and place patients on the floor on their left side  Loosen clothing  around the neck and ensure the airway is patent. If the patient is clenching the teeth, do not force the mouth open with any object as this can cause severe damage - Remove all items from the surrounding that can be hazardous. The patient may be oblivious to what's happening and may not even know what he or she is doing. If the patient is confused and wandering, either gently guide him/her away and block access to outside areas - Reassure the individual and be comforting - Call 911. In most cases, the seizure ends before EMS arrives. However, there are cases when seizures may last over 3 to 5 minutes. Or the individual may have developed breathing difficulties or severe injuries. If a pregnant patient or a person with  diabetes develops a seizure, it is prudent to call an ambulance.     After the Seizure (Postictal Stage)   After a seizure, most patients experience confusion, fatigue, muscle pain and/or a headache. Thus, one should permit the individual to sleep. For the next few days, reassurance is essential. Being calm and helping reorient the person is also of importance.   Most seizures are painless and end spontaneously. Seizures are not harmful to others but can lead to complications such as stress on the lungs, brain and the heart. Individuals with prior lung problems may develop labored breathing and respiratory distress.         Thank you for allowing us  to participate in the care of this patient. If you have any further questions, please contact  me or neurohospitalist.    I have spent a total of   35 minutes with the patient reviewing hospital notes,  test results, labs and examining the patient as well as establishing an assessment and plan that was discussed personally with the patient.  > 50% of time was spent in direct patient care.    Arlin Krebs Epilepsy [facility] For questions after 5pm please refer to AMION to reach the Neurologist on call

## 2023-10-26 NOTE — Progress Notes (Signed)
 LTM VIDEO EEG discontinued - no skin breakdown at [facility]

## 2023-10-29 ENCOUNTER — Telehealth: Payer: Self-pay

## 2023-10-29 NOTE — Transitions of Care (Post Inpatient/ED Visit) (Unsigned)
 10/29/2023  Name: Jessica Strong  LESHIA KOPE MRN: [id number] DOB: 12-31-1945  Today's TOC FU Call Status: Today's TOC FU Call Status:: Successful TOC FU Call Completed TOC FU Call Complete Date: 10/29/23 Patient's Name and Date of Birth confirmed.  Transition Care Management Follow-up Telephone Call Date of Discharge: 10/26/23 Discharge Facility: [facility]) Type of Discharge: Inpatient Admission Primary Inpatient Discharge Diagnosis:: Seizure like activity How have you been since you were released from the hospital?: Same Any questions or concerns?: No  Items Reviewed: Did you receive and understand the discharge instructions provided?: Yes Medications obtained,verified, and reconciled?: Yes (Medications Reviewed) Any new allergies since your discharge?: No Dietary orders reviewed?: NA Do you have support at home?: Yes People in Home [RPT]: spouse  Medications Reviewed Today: Medications Reviewed Today     Reviewed by Lavelle Charmaine NOVAK, LPN (Licensed Practical Nurse) on 10/29/23 at 1127  Med List Status: <None>   Medication Order Taking? Sig Documenting Provider Last Dose Status Informant  ACCU-CHEK AVIVA PLUS test strip [id number] Yes Inject 1 strip as directed daily. [provider]  Active Spouse/Significant Other, Child           Med Note JERALYN, FLORIDA A   Tue Jul 10, 2017  9:13 AM)    acetaminophen  (TYLENOL ) 500 MG tablet [id number] Yes Take 500 mg by mouth every 6 (six) hours as needed for mild pain or headache. As needed. [provider]  Active Spouse/Significant Other, Child  albuterol  (VENTOLIN  HFA) 108 (90 Base) MCG/ACT inhaler [id number] Yes Inhale 2 puffs into the lungs every 6 (six) hours as needed for wheezing or shortness of breath. Zarwolo, Gloria, FNP  Active Spouse/Significant Other, Child  aspirin  325 MG tablet [id number]  Take 325 mg by mouth daily.  Patient not taking: Reported on 10/29/2023   [provider]  Active Spouse/Significant  Other, Child           Med Note ZARA [facility] M   Mon Apr 16, 2023  2:53 PM)    calcitRIOL (ROCALTROL) 0.25 MCG capsule [id number] Yes Take 0.25 mcg by mouth daily. [provider]  Active Spouse/Significant Other, Child  Continuous Glucose Sensor (DEXCOM G7 SENSOR) MISC [id number] Yes by Does not apply route. [provider]  Active Spouse/Significant Other, Child  EPINEPHrine 0.3 mg/0.3 mL IJ SOAJ injection [id number] Yes Inject 0.3 mg into the muscle as needed for anaphylaxis. [provider]  Active Spouse/Significant Other, Child  furosemide  (LASIX ) 20 MG tablet [id number] Yes TAKE 1 TABLET BY MOUTH DAILY.  (MAY TAKE EXTRA 1 TABLET AS  NEEDED FOR SEVERE SWELLING) Branch, Dorn FALCON, MD  Active Spouse/Significant Other, Child  Lancets Thin MISC [id number] Yes Inject 1 Stick into the skin daily. [provider]  Active Spouse/Significant Other, Child           Med Note JERALYN, ROCKEY A   Tue Jul 10, 2017  9:13 AM)    levETIRAcetam  (KEPPRA ) 250 MG tablet [id number] Yes Take 1 tablet (250 mg total) by mouth 2 (two) times daily. Jonel Lonni SQUIBB, MD  Active   levothyroxine  (SYNTHROID ) 88 MCG tablet [id number] Yes Take 1 tablet (88 mcg total) by mouth daily before breakfast. Therisa Benton PARAS, NP  Active Spouse/Significant Other, Child  Multiple Vitamins-Minerals (MULTIVITAMIN ADULT) CHEW [id number] Yes Chew 1 tablet by mouth daily. [provider]  Active Spouse/Significant Other, Child  NOVOFINE PEN NEEDLE 32G X 6 MM MISC [id number] Yes Use to inject insulin  4 times daily Zarwolo, Gloria, FNP  Active  Spouse/Significant Other, Child  olmesartan  (BENICAR ) 20 MG tablet [id number] Yes TAKE 1 TABLET BY MOUTH DAILY Zarwolo, Gloria, FNP  Active Spouse/Significant Other, Child  omeprazole  (PRILOSEC) 40 MG capsule [id number] Yes Take 1 capsule (40 mg total) by mouth daily.  Patient taking differently: Take 40 mg by mouth daily as needed.   Castaneda [name],  Toribio, MD  Active Spouse/Significant Other, Child  potassium chloride  (KLOR-CON  M) 10 MEQ tablet [id number] Yes TAKE 1 TABLET BY MOUTH DAILY  Patient taking differently: Take 5 mEq by mouth daily.   Zarwolo, Gloria, FNP  Active Spouse/Significant Other, Child  simvastatin  (ZOCOR ) 20 MG tablet [id number] Yes Take 1 tablet (20 mg total) by mouth at bedtime. Therisa Benton PARAS, NP  Active Spouse/Significant Other, Child  terbinafine  (LAMISIL  AT) 1 % cream [id number] Yes Apply 1 Application topically 2 (two) times daily. Zarwolo, Gloria, FNP  Active Spouse/Significant Other, Child  thiamine  (VITAMIN B-1) 100 MG tablet [id number] Yes Take 1 tablet (100 mg total) by mouth daily. Jonel Lonni SQUIBB, MD  Active   tirzepatide  (MOUNJARO ) 15 MG/0.5ML Pen [id number] Yes Inject 15 mg into the skin once a week.  Patient taking differently: Inject 15 mg into the skin every Friday.   Therisa Benton PARAS, NP  Active Spouse/Significant Other, Child  TRESIBA  FLEXTOUCH 200 UNIT/ML FlexTouch Pen [id number] Yes Inject 66 Units into the skin daily at 10 pm.  Patient taking differently: Inject 60 Units into the skin at bedtime.   Therisa Benton PARAS, NP  Active Spouse/Significant Other, Child  Vibegron  (GEMTESA ) 75 MG TABS [id number] Yes Take 1 tablet (75 mg total) by mouth daily. Nieves Cough, MD  Active Spouse/Significant Other, Child            Home Care and Equipment/Supplies: Were Home Health Services Ordered?: Yes Name of Home Health Agency:: [facility] [place] Has Agency set up a time to come to your home?: No EMR reviewed for Home Health Orders: Orders present/patient has not received call (refer to CM for follow-up) Any new equipment or medical supplies ordered?: No  Functional Questionnaire: Do you need assistance with bathing/showering or dressing?: No Do you need assistance with meal preparation?: Yes Do you need assistance with eating?: No Do you have difficulty maintaining  continence: No Do you need assistance with getting out of bed/getting out of a chair/moving?: No Do you have difficulty managing or taking your medications?: Yes  Follow up appointments reviewed: PCP Follow-up appointment confirmed?: Yes Date of PCP follow-up appointment?: 11/09/23 Follow-up Provider: Gloria Zarwolo Specialist [facility] Follow-up appointment confirmed?: No Reason Specialist Follow-Up Not Confirmed: Patient has Specialist Provider Number and will Call for Appointment Do you need transportation to your follow-up appointment?: No Do you understand care options if your condition(s) worsen?: Yes-patient verbalized understanding    SIGNATURE Charmaine Bloodgood, LPN [facility] Health Advisor [facility] You Are. We Are. [facility] Direct Dial [phone]

## 2023-10-31 ENCOUNTER — Ambulatory Visit (INDEPENDENT_AMBULATORY_CARE_PROVIDER_SITE_OTHER)

## 2023-10-31 DIAGNOSIS — N3281 Overactive bladder: Secondary | ICD-10-CM | POA: Diagnosis not present

## 2023-10-31 NOTE — Progress Notes (Signed)
 PTNS  Session # 10 of 12  PTNS was prescribed for the patient's Overactive Bladder symptoms. The voiding diary and/or patients symptoms were reviewed prior to the start of treatment.   Patient Goals: Reduce urgency  Health & Social Factors: Patient was in hospital for a week Caffeine: coffee 1 /day Alcohol : no Daytime voids #per day: 5 Night-time voids #per night: 0 Urgency: strong Incontinence Episodes #per day: 2 Treatment Plan/Comments: Toilet every 3 hours  Ankle used: right Treatment Setting: 12 Feeling/ Response: positive sensation  PTNS Treatment The needle electrode was inserted into the lower, inner aspect of patient's leg. The surface electrode was placed on the inside arch of the foot on the treatment leg.The lead set was connected to the stimulator, and the needle electrode clip was connected to the needle electrode. The stimulator that produces an adjustable electrical pulse that travels to the sacral nerve plexus via the tibial nerve was increased until a patient response was observed.    Performed By: Exie LANES CMA  Follow Up: as scheduled

## 2023-11-02 ENCOUNTER — Ambulatory Visit: Payer: Self-pay | Admitting: Family Medicine

## 2023-11-05 ENCOUNTER — Encounter: Payer: Self-pay | Admitting: Neurology

## 2023-11-05 ENCOUNTER — Telehealth: Payer: Self-pay | Admitting: Family Medicine

## 2023-11-05 NOTE — Telephone Encounter (Signed)
 FMLA (daughter)  Noted  Copied Sleeved  Original in PCP box Copy front desk folder

## 2023-11-07 ENCOUNTER — Ambulatory Visit

## 2023-11-07 DIAGNOSIS — N3281 Overactive bladder: Secondary | ICD-10-CM

## 2023-11-07 NOTE — Progress Notes (Signed)
 PTNS  Session # 11 of 12  PTNS was prescribed for the patient's Overactive Bladder symptoms. The voiding diary and/or patients symptoms were reviewed prior to the start of treatment.   Patient Goals: sleep through night  Health & Social Factors:  no change Caffeine: 1 a day Alcohol : no Daytime voids #per day: 6 Night-time voids #per night: 0 Urgency: yes severe Incontinence Episodes #per day: 3 Treatment Plan/Comments: toliet every 2 hours  Ankle used: left Treatment Setting: 5 Feeling/ Response: positive sensation  PTNS Treatment The needle electrode was inserted into the lower, inner aspect of patient's leg. The surface electrode was placed on the inside arch of the foot on the treatment leg.The lead set was connected to the stimulator, and the needle electrode clip was connected to the needle electrode. The stimulator that produces an adjustable electrical pulse that travels to the sacral nerve plexus via the tibial nerve was increased until a patient response was observed.    Performed By: Carlos, CMA  Follow Up: Keep weekly visited

## 2023-11-07 NOTE — Patient Instructions (Signed)

## 2023-11-09 ENCOUNTER — Ambulatory Visit (INDEPENDENT_AMBULATORY_CARE_PROVIDER_SITE_OTHER): Admitting: Family Medicine

## 2023-11-09 ENCOUNTER — Encounter: Payer: Self-pay | Admitting: Family Medicine

## 2023-11-09 VITALS — BP 109/69 | HR 69 | Ht 60.0 in | Wt 269.0 lb

## 2023-11-09 DIAGNOSIS — E1143 Type 2 diabetes mellitus with diabetic autonomic (poly)neuropathy: Secondary | ICD-10-CM | POA: Diagnosis not present

## 2023-11-09 DIAGNOSIS — R569 Unspecified convulsions: Secondary | ICD-10-CM | POA: Diagnosis not present

## 2023-11-09 MED ORDER — VITAMIN B-6 50 MG PO TABS: 50.0000 mg | ORAL_TABLET | Freq: Every day | ORAL | 1 refills | Status: AC

## 2023-11-09 NOTE — Patient Instructions (Addendum)
 I appreciate the opportunity to provide care to you today!  Labs: please stop by the lab today to get your blood drawn (CBC, BMP,  vit B-1)  -Start taking Pyridoxine (Vitamin B6) 50 mg daily to help with numbness in your left lower extremity. -Follow up with Neurology as scheduled. -Continue your current treatment regimen as prescribed.  For a Healthier YOU, I Recommend: Reducing your intake of sugar, sodium, carbohydrates, and saturated fats. Increasing your fiber intake by incorporating more whole grains, fruits, and vegetables into your meals. Setting healthy goals with a focus on lowering your consumption of carbs, sugar, and unhealthy fats. Adding variety to your diet by including a wide range of fruits and vegetables. Cutting back on soda and limiting processed foods as much as possible. Staying active: In addition to taking your weight loss medication, aim for at least 150 minutes of moderate-intensity physical activity each week for optimal results.   Please continue to a heart-healthy diet and increase your physical activities. Try to exercise for at least five days a week.    It was a pleasure to see you and I look forward to continuing to work together on your health and well-being. Please do not hesitate to call the office if you need care or have questions about your care.  In case of emergency, please visit the Emergency Department for urgent care, or contact our clinic at [phone] to schedule an appointment. We're here to help you!   Have a wonderful day and week. With Gratitude, Abriana Saltos MSN, FNP-BC

## 2023-11-09 NOTE — Progress Notes (Signed)
 Established Patient Office Visit  Subjective:  Patient ID: Jessica  Jessica Strong, female    DOB: [birth year]  Age: 78 y.o. MRN: [id number]  CC:  Chief Complaint  Patient presents with   Hospitalization Follow-up    Hospital Follow up    HPI Brynlyn  [name] is a 78 y.o. female   presents for Hospital follow up.  Seizure-like activity: The patient is a 78 year old female with a history of morbid obesity, Roux-en-Y gastric bypass, cerebrovascular accident with residual right hemiparesis, cerebral aneurysm status post clipping in 2007, diabetes mellitus, hypertension, hypothyroidism, obstructive sleep apnea, and chronic kidney disease stage IV (baseline creatinine 1.7-2.0). She was recently hospitalized for acute confusion. During her admission, neurology was consulted, and a comprehensive metabolic workup was unremarkable. A 20-minute EEG was negative for seizure activity, and repeat head CT showed no acute abnormalities. Her mental status gradually improved, though she had not fully returned to baseline at discharge. She was subsequently transferred to [facility] for long-term monitoring (LTM) per neurology's recommendation. At today's follow-up, the patient reports feeling well and states she has continued to improve since her discharge. No new concerns reported.   Past Medical History:  Diagnosis Date   Allergy    Anemia    Anxiety    Arthritis    Asthma    Cataract    cervical ca 1990   Depression    Diabetes (HCC)    Dyspnea    Hypercholesterolemia    Hypertension    Hypothyroidism    Myocardial infarction (HCC) 2005   Sleep apnea    on CPAP 14 mm PS since February 2017   Stroke [facility]) 2007   weakness on R side   Vitamin D  deficiency     Past Surgical History:  Procedure Laterality Date   ABDOMINAL HYSTERECTOMY     BARIATRIC SURGERY     BIOPSY  09/17/2020   Procedure: BIOPSY;  Surgeon: Eartha Angelia Sieving, MD;  [facility]: [facility];  Service: Gastroenterology;;    BIOPSY  12/08/2022   Procedure: BIOPSY;  Surgeon: Eartha Angelia Sieving, MD;  Location: [facility];  Service: Gastroenterology;;   BREAST BIOPSY Right    beningn   CARDIAC CATHETERIZATION N/A 12/24/2015   Procedure: Right/Left Heart Cath and Coronary Angiography;  Surgeon: Candyce GORMAN Reek, MD;  Location: [facility] INVASIVE CV LAB;  Service: Cardiovascular;  Laterality: N/A;   COLONOSCOPY  2018   COLONOSCOPY WITH PROPOFOL  N/A 09/17/2020   Procedure: COLONOSCOPY WITH PROPOFOL ;  Surgeon: Eartha Angelia Sieving, MD;  Location: [facility];  Service: Gastroenterology;  Laterality: N/A;  9:00   ESOPHAGOGASTRODUODENOSCOPY (EGD) WITH PROPOFOL  N/A 09/17/2020   Procedure: ESOPHAGOGASTRODUODENOSCOPY (EGD) WITH PROPOFOL ;  Surgeon: Eartha Angelia Sieving, MD;  Location: [facility];  Service: Gastroenterology;  Laterality: N/A;   ESOPHAGOGASTRODUODENOSCOPY (EGD) WITH PROPOFOL  N/A 12/08/2022   Procedure: ESOPHAGOGASTRODUODENOSCOPY (EGD) WITH PROPOFOL ;  Surgeon: Eartha Angelia Sieving, MD;  Location: [facility];  Service: Gastroenterology;  Laterality: N/A;  2:30PM;ASA 3   HERNIA REPAIR     LEFT HEART CATH AND CORONARY ANGIOGRAPHY N/A 07/12/2017   Procedure: LEFT HEART CATH AND CORONARY ANGIOGRAPHY;  Surgeon: Claudene Victory ORN, MD;  Location: [facility] INVASIVE CV LAB;  Service: Cardiovascular;  Laterality: N/A;    Family History  Problem Relation Age of Onset   Cancer Mother    Colon cancer Mother    Diabetes Mother    Hypertension Father    Thyroid  disease Father    Thyroid  cancer Father  Diabetes Father    Esophageal cancer Father    Breast cancer Maternal Aunt    Colon cancer Maternal Aunt    Breast cancer Maternal Aunt    Colon cancer Maternal Grandmother    Stroke Maternal Grandfather    Stomach cancer Paternal Grandfather    Colon cancer Son    Colon polyps Neg Hx    Rectal cancer Neg Hx     Social History   Socioeconomic History   Marital status: Married    Spouse  name: Not on file   Number of children: 2   Years of education: Not on file   Highest education level: Not on file  Occupational History   Occupation: retired  Tobacco Use   Smoking status: Never    Passive exposure: Never   Smokeless tobacco: Never  Vaping Use   Vaping status: Never Used  Substance and Sexual Activity   Alcohol  use: Not Currently    Comment: occasionally   Drug use: No   Sexual activity: Never    Partners: Male  Other Topics Concern   Not on file  Social History Narrative   Not on file   Social Drivers of Health   Financial Resource Strain: Low Risk  (11/30/2022)   Overall Financial Resource Strain (CARDIA)    Difficulty of Paying Living Expenses: Not hard at all  Food Insecurity: No Food Insecurity (10/21/2023)   Hunger Vital Sign    Worried About Running Out of Food in the Last Year: Never true    Ran Out of Food in the Last Year: Never true  Transportation Needs: No Transportation Needs (10/21/2023)   PRAPARE - Administrator, Civil Service (Medical): No    Lack of Transportation (Non-Medical): No  Physical Activity: Inactive (11/30/2022)   Exercise Vital Sign    Days of Exercise per Week: 0 days    Minutes of Exercise per Session: 0 min  Stress: No Stress Concern Present (11/30/2022)   [employer] of Occupational Health - Occupational Stress Questionnaire    Feeling of Stress : Not at all  Social Connections: Moderately Isolated (10/21/2023)   Social Connection and Isolation Panel    Frequency of Communication with Friends and Family: More than three times a week    Frequency of Social Gatherings with Friends and Family: Three times a week    Attends Religious Services: Never    Active Member of Clubs or Organizations: No    Attends Banker Meetings: Never    Marital Status: Married  Catering manager Violence: Patient Unable To Answer (10/21/2023)   Humiliation, Afraid, Rape, and Kick questionnaire    Fear of Current or  Ex-Partner: Patient unable to answer    Emotionally Abused: Patient unable to answer    Physically Abused: Patient unable to answer    Sexually Abused: Patient unable to answer    Outpatient Medications Prior to Visit  Medication Sig Dispense Refill   ACCU-CHEK AVIVA PLUS test strip Inject 1 strip as directed daily.  4   acetaminophen  (TYLENOL ) 500 MG tablet Take 500 mg by mouth every 6 (six) hours as needed for mild pain or headache. As needed.     albuterol  (VENTOLIN  HFA) 108 (90 Base) MCG/ACT inhaler Inhale 2 puffs into the lungs every 6 (six) hours as needed for wheezing or shortness of breath. 8 g 2   aspirin  325 MG tablet Take 325 mg by mouth daily. (Patient not taking: Reported on 10/29/2023)  calcitRIOL (ROCALTROL) 0.25 MCG capsule Take 0.25 mcg by mouth daily.     Continuous Glucose Sensor (DEXCOM G7 SENSOR) MISC by Does not apply route.     EPINEPHrine 0.3 mg/0.3 mL IJ SOAJ injection Inject 0.3 mg into the muscle as needed for anaphylaxis.     furosemide  (LASIX ) 20 MG tablet TAKE 1 TABLET BY MOUTH DAILY.  (MAY TAKE EXTRA 1 TABLET AS  NEEDED FOR SEVERE SWELLING) 200 tablet 2   Lancets Thin MISC Inject 1 Stick into the skin daily.     levETIRAcetam  (KEPPRA ) 250 MG tablet Take 1 tablet (250 mg total) by mouth 2 (two) times daily. 180 tablet 0   levothyroxine  (SYNTHROID ) 88 MCG tablet Take 1 tablet (88 mcg total) by mouth daily before breakfast. 90 tablet 3   Multiple Vitamins-Minerals (MULTIVITAMIN ADULT) CHEW Chew 1 tablet by mouth daily.     NOVOFINE PEN NEEDLE 32G X 6 MM MISC Use to inject insulin  4 times daily 200 each 2   olmesartan  (BENICAR ) 20 MG tablet TAKE 1 TABLET BY MOUTH DAILY 100 tablet 2   omeprazole  (PRILOSEC) 40 MG capsule Take 1 capsule (40 mg total) by mouth daily. (Patient taking differently: Take 40 mg by mouth daily as needed.) 90 capsule 3   potassium chloride  (KLOR-CON  M) 10 MEQ tablet TAKE 1 TABLET BY MOUTH DAILY (Patient taking differently: Take 5 mEq by mouth  daily.) 100 tablet 2   simvastatin  (ZOCOR ) 20 MG tablet Take 1 tablet (20 mg total) by mouth at bedtime. 90 tablet 3   terbinafine  (LAMISIL  AT) 1 % cream Apply 1 Application topically 2 (two) times daily. 42 g 1   thiamine  (VITAMIN B-1) 100 MG tablet Take 1 tablet (100 mg total) by mouth daily.     tirzepatide  (MOUNJARO ) 15 MG/0.5ML Pen Inject 15 mg into the skin once a week. (Patient taking differently: Inject 15 mg into the skin every Friday.) 6 mL 3   TRESIBA  FLEXTOUCH 200 UNIT/ML FlexTouch Pen Inject 66 Units into the skin daily at 10 pm. (Patient taking differently: Inject 60 Units into the skin at bedtime.) 45 mL 3   Vibegron  (GEMTESA ) 75 MG TABS Take 1 tablet (75 mg total) by mouth daily. 30 tablet 11   No facility-administered medications prior to visit.    Allergies  Allergen Reactions   Chocolate Other (See Comments)    Pt and daughter report it causes dark discoloration on elbows.    Penicillins Rash    Has patient had a PCN reaction causing immediate rash, facial/tongue/throat swelling, SOB or lightheadedness with hypotension: Yes Has patient had a PCN reaction causing severe rash involving mucus membranes or skin necrosis: No Has patient had a PCN reaction that required hospitalization No Has patient had a PCN reaction occurring within the last 10 years: No. Has not had in 40-50 years If all of the above answers are NO, then may proceed with Cephalosporin use.    ROS Review of Systems  Constitutional:  Negative for chills and fever.  Eyes:  Negative for visual disturbance.  Respiratory:  Negative for chest tightness and shortness of breath.   Neurological:  Negative for dizziness and headaches.      Objective:    Physical Exam HENT:     Head: Normocephalic.     Mouth/Throat:     Mouth: Mucous membranes are moist.  Cardiovascular:     Rate and Rhythm: Normal rate.     Heart sounds: Normal heart sounds.  Pulmonary:  Effort: Pulmonary effort is normal.      Breath sounds: Normal breath sounds.  Neurological:     Mental Status: She is alert.     BP 109/69   Pulse 69   Ht 5' (1.524 m)   Wt 269 lb (122 kg)   SpO2 92%   BMI 52.54 kg/m  Wt Readings from Last 3 Encounters:  11/09/23 269 lb (122 kg)  10/20/23 267 lb 9.6 oz (121.4 kg)  10/18/23 268 lb 9.6 oz (121.8 kg)    Lab Results  Component Value Date   TSH 1.256 10/20/2023   Lab Results  Component Value Date   WBC 12.4 (H) 10/26/2023   HGB 11.3 (L) 10/26/2023   HCT 36.0 10/26/2023   MCV 83.3 10/26/2023   PLT 320 10/26/2023   Lab Results  Component Value Date   NA 138 10/26/2023   K 4.7 10/26/2023   CO2 19 (L) 10/26/2023   GLUCOSE 203 (H) 10/26/2023   BUN 34 (H) 10/26/2023   CREATININE 2.15 (H) 10/26/2023   BILITOT 0.2 10/26/2023   ALKPHOS 91 10/26/2023   AST 18 10/26/2023   ALT 13 10/26/2023   PROT 6.6 10/26/2023   ALBUMIN  3.2 (L) 10/26/2023   CALCIUM  9.1 10/26/2023   ANIONGAP 14 10/26/2023   EGFR 27 (L) 10/15/2023   Lab Results  Component Value Date   CHOL 147 10/15/2023   Lab Results  Component Value Date   HDL 45 10/15/2023   Lab Results  Component Value Date   LDLCALC 67 10/15/2023   Lab Results  Component Value Date   TRIG 214 (H) 10/15/2023   Lab Results  Component Value Date   CHOLHDL 3.3 10/15/2023   Lab Results  Component Value Date   HGBA1C 8.2 (H) 10/15/2023      Assessment & Plan:  Seizure-like activity [facility]) Assessment & Plan: Labs and imaging studies reviewed Encouraged to follow up with Neurology as scheduled. Encouraged to continue herr current treatment regimen as prescribed.   Orders: -     CBC with Differential/Platelet -     BMP8+EGFR -     Vitamin B1  Type 2 diabetes mellitus with diabetic autonomic neuropathy, without long-term current use of insulin  (HCC) Assessment & Plan: The patient reports numbness in the left upper femoral region. The symptom is non-radiating. Will initiate therapy with Pyridoxine (Vitamin  B6) 50 mg daily.   Orders: -     Vitamin B-6; Take 1 tablet (50 mg total) by mouth daily.  Dispense: 30 tablet; Refill: 1  Note: This chart has been completed using Engineer, civil (consulting) software, and while attempts have been made to ensure accuracy, certain words and phrases may not be transcribed as intended.    Follow-up: No follow-ups on file.   Veralyn Lopp, FNP

## 2023-11-09 NOTE — Assessment & Plan Note (Signed)
 The patient reports numbness in the left upper femoral region. The symptom is non-radiating. Will initiate therapy with Pyridoxine (Vitamin B6) 50 mg daily.

## 2023-11-09 NOTE — Assessment & Plan Note (Addendum)
 Labs and imaging studies reviewed Encouraged to follow up with Neurology as scheduled. Encouraged to continue herr current treatment regimen as prescribed.

## 2023-11-13 LAB — BMP8+EGFR
BUN/Creatinine Ratio: 17 (ref 12–28)
BUN: 36 mg/dL — ABNORMAL HIGH (ref 8–27)
CO2: 20 mmol/L (ref 20–29)
Calcium: 9.3 mg/dL (ref 8.7–10.3)
Chloride: 106 mmol/L (ref 96–106)
Creatinine, Ser: 2.06 mg/dL — ABNORMAL HIGH (ref 0.57–1.00)
Glucose: 140 mg/dL — ABNORMAL HIGH (ref 70–99)
Potassium: 5.2 mmol/L (ref 3.5–5.2)
Sodium: 142 mmol/L (ref 134–144)
eGFR: 25 mL/min/1.73 — ABNORMAL LOW (ref >59)

## 2023-11-13 LAB — CBC WITH DIFFERENTIAL/PLATELET
Basophils Absolute: 0.1 x10E3/uL (ref 0.0–0.2)
Basos: 1 %
EOS (ABSOLUTE): 0.3 x10E3/uL (ref 0.0–0.4)
Eos: 3 %
Hematocrit: 35.5 % (ref 34.0–46.6)
Hemoglobin: 10.6 g/dL — ABNORMAL LOW (ref 11.1–15.9)
Immature Grans (Abs): 0.1 x10E3/uL (ref 0.0–0.1)
Immature Granulocytes: 1 %
Lymphocytes Absolute: 2.2 x10E3/uL (ref 0.7–3.1)
Lymphs: 22 %
MCH: 25.7 pg — ABNORMAL LOW (ref 26.6–33.0)
MCHC: 29.9 g/dL — ABNORMAL LOW (ref 31.5–35.7)
MCV: 86 fL (ref 79–97)
Monocytes Absolute: 0.9 x10E3/uL (ref 0.1–0.9)
Monocytes: 9 %
Neutrophils Absolute: 6.3 x10E3/uL (ref 1.4–7.0)
Neutrophils: 64 %
Platelets: 277 x10E3/uL (ref 150–450)
RBC: 4.12 x10E6/uL (ref 3.77–5.28)
RDW: 14.4 % (ref 11.7–15.4)
WBC: 9.8 x10E3/uL (ref 3.4–10.8)

## 2023-11-13 LAB — VITAMIN B1: Thiamine: 192 nmol/L (ref 66.5–200.0)

## 2023-11-15 ENCOUNTER — Ambulatory Visit (INDEPENDENT_AMBULATORY_CARE_PROVIDER_SITE_OTHER)

## 2023-11-15 ENCOUNTER — Ambulatory Visit

## 2023-11-15 DIAGNOSIS — N3281 Overactive bladder: Secondary | ICD-10-CM | POA: Diagnosis not present

## 2023-11-15 NOTE — Patient Instructions (Signed)

## 2023-11-15 NOTE — Progress Notes (Signed)
 PTNS  Session # 12 of 12  PTNS was prescribed for the patient's Overactive Bladder symptoms. The voiding diary and/or patients symptoms were reviewed prior to the start of treatment.   Patient Goals: sleep thorugh the night and void every 3 hours  Health & Social Factors: No change  Caffeine: coffee 1 /day Alcohol : no Daytime voids #per day: 5 Night-time voids #per night: 0 Urgency: strong Incontinence Episodes #per day: 2 Treatment Plan/Comments: toilet every 2 hours  Ankle used: right Treatment Setting: 8 Feeling/ Response: positive sensation  PTNS Treatment The needle electrode was inserted into the lower, inner aspect of patient's leg. The surface electrode was placed on the inside arch of the foot on the treatment leg.The lead set was connected to the stimulator, and the needle electrode clip was connected to the needle electrode. The stimulator that produces an adjustable electrical pulse that travels to the sacral nerve plexus via the tibial nerve was increased until a patient response was observed.    Performed Ab:Jopdyzb T. CMA  Follow Up: as scheduled

## 2023-11-16 NOTE — Telephone Encounter (Signed)
In providers box 

## 2023-11-18 ENCOUNTER — Ambulatory Visit: Payer: Self-pay | Admitting: Family Medicine

## 2023-11-19 ENCOUNTER — Other Ambulatory Visit: Payer: Self-pay | Admitting: Nurse Practitioner

## 2023-11-19 DIAGNOSIS — E7849 Other hyperlipidemia: Secondary | ICD-10-CM

## 2023-11-19 NOTE — Telephone Encounter (Signed)
 No update- still in providers box

## 2023-11-26 ENCOUNTER — Telehealth: Payer: Self-pay

## 2023-11-26 NOTE — Telephone Encounter (Signed)
 Called Pt to inform her that her  lab results are stable. Meade recommended increasing her intake of iron-rich foods such as red meat, chicken, [place], spinach, kale, collard greens, legumes, fortified cereals, and egg yolks to support healthy iron levels.

## 2023-11-28 NOTE — Telephone Encounter (Signed)
 Patient's daughter came to the office to collect FMLA stating she was advised they would be ready for pick up Mon 7/28  Last seen by PCP on 7/11 for HFU.  Patient's daughter, Adolph, would like a call back at [phone] when paperwork is ready. States it is needed ASAP for her employer.

## 2023-11-29 NOTE — Telephone Encounter (Signed)
 Patient calling back on forms- Provider still has forms and hasn't completed. Message sent to provider that form is due

## 2023-12-05 NOTE — Telephone Encounter (Signed)
 Patient's daughter came by asking for paperwork. She is upset says it's been turned in for a month and everything after the 19th is pending and she turned it in with enough time to have it filled out before then. Patient requesting a call back tomorrow morning when Meade is back in office to let her know that paperwork is finished.

## 2023-12-06 NOTE — Telephone Encounter (Signed)
 LVM to let pt daughter know paperwork will be done latest tomorrow per Yosseline.

## 2023-12-07 NOTE — Telephone Encounter (Signed)
Daughter picked up the forms.

## 2023-12-19 DIAGNOSIS — I1310 Hypertensive heart and chronic kidney disease without heart failure, with stage 1 through stage 4 chronic kidney disease, or unspecified chronic kidney disease: Secondary | ICD-10-CM | POA: Diagnosis not present

## 2023-12-19 DIAGNOSIS — G9341 Metabolic encephalopathy: Secondary | ICD-10-CM

## 2023-12-19 DIAGNOSIS — F419 Anxiety disorder, unspecified: Secondary | ICD-10-CM

## 2023-12-19 DIAGNOSIS — E1122 Type 2 diabetes mellitus with diabetic chronic kidney disease: Secondary | ICD-10-CM | POA: Diagnosis not present

## 2023-12-19 DIAGNOSIS — D631 Anemia in chronic kidney disease: Secondary | ICD-10-CM

## 2023-12-19 DIAGNOSIS — J45909 Unspecified asthma, uncomplicated: Secondary | ICD-10-CM

## 2023-12-19 DIAGNOSIS — E039 Hypothyroidism, unspecified: Secondary | ICD-10-CM

## 2023-12-19 DIAGNOSIS — G40909 Epilepsy, unspecified, not intractable, without status epilepticus: Secondary | ICD-10-CM | POA: Diagnosis not present

## 2023-12-19 DIAGNOSIS — N184 Chronic kidney disease, stage 4 (severe): Secondary | ICD-10-CM | POA: Diagnosis not present

## 2023-12-19 DIAGNOSIS — F32A Depression, unspecified: Secondary | ICD-10-CM

## 2023-12-19 DIAGNOSIS — I69351 Hemiplegia and hemiparesis following cerebral infarction affecting right dominant side: Secondary | ICD-10-CM

## 2023-12-19 DIAGNOSIS — F39 Unspecified mood [affective] disorder: Secondary | ICD-10-CM

## 2023-12-28 ENCOUNTER — Other Ambulatory Visit (INDEPENDENT_AMBULATORY_CARE_PROVIDER_SITE_OTHER): Payer: Self-pay | Admitting: Gastroenterology

## 2023-12-28 DIAGNOSIS — K2970 Gastritis, unspecified, without bleeding: Secondary | ICD-10-CM

## 2023-12-28 DIAGNOSIS — R109 Unspecified abdominal pain: Secondary | ICD-10-CM

## 2024-01-13 ENCOUNTER — Other Ambulatory Visit: Payer: Self-pay | Admitting: Nurse Practitioner

## 2024-01-14 ENCOUNTER — Ambulatory Visit: Admitting: Urology

## 2024-01-14 VITALS — BP 143/78 | HR 89

## 2024-01-14 DIAGNOSIS — N3946 Mixed incontinence: Secondary | ICD-10-CM

## 2024-01-14 DIAGNOSIS — N3281 Overactive bladder: Secondary | ICD-10-CM | POA: Diagnosis not present

## 2024-01-14 DIAGNOSIS — R32 Unspecified urinary incontinence: Secondary | ICD-10-CM

## 2024-01-14 LAB — URINALYSIS, ROUTINE W REFLEX MICROSCOPIC
Bilirubin, UA: NEGATIVE
Glucose, UA: NEGATIVE
Ketones, UA: NEGATIVE
Leukocytes,UA: NEGATIVE
Nitrite, UA: NEGATIVE
Protein,UA: NEGATIVE
RBC, UA: NEGATIVE
Specific Gravity, UA: 1.025 (ref 1.005–1.030)
Urobilinogen, Ur: 0.2 mg/dL (ref 0.2–1.0)
pH, UA: 6 (ref 5.0–7.5)

## 2024-01-14 NOTE — Progress Notes (Unsigned)
 01/14/2024 2:26 PM   Jessica Strong  Jessica Strong April 28, 1946 [id number]  Referring provider: Zarwolo, Gloria, FNP [address] [place],  [name] [address]  No chief complaint on file.   HPI:  F/u -   1) urinary frequency-she has frequency and urgency. She has incontinence. She wears pads and goes through several per day and night. She s/p caldera sling on 01/05/2018 with Dr. Narvis. No change in incontinence. Maybe worse. She leaks with urgency. Not so much with stress. No gross hematuria. She tried Myrbetriq. 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 She does have constipation.  CT abdomen and pelvis March 2023 benign GU tract.  Punctate left renal stone.    PVR was 5 ml. Cystoscopy benign Jul 2023.She tried Gemtesa  which worked well for a week but then was less than effective. Standing triggers. She c/o urgency and leakage.    She underwent Jul 2023 UDS - pvr 50, C 100 ml, sens 55, desire 86. Unstable with severe leakage. No SUI. Normal voiding. She tried tolterodine  which seemed to work for a few weeks and then stopped.    She started combination therapy. Noc improved but still has continual leakage without awareness in the day. When she needs to go to the bathroom she is wet.    She underwent botox  injection 100 U x 20 sites 06/16/2022. LUTS and incontinence improved. Pad usage has gone down. Noc improved. Her frequency and nocturia has returned. Incontinence worse. Jul 2024 Abd US  - no hydro. Repeated BOTOX  100 U Oct 2024. F/u PVR 43 ml.    Today, seen for the above. Rx for Gemtesa  Oct 2024. Did not help. Started PTNS Mar 2025. No change in incontinence.    PMH: Past Medical History:  Diagnosis Date   Allergy    Anemia    Anxiety    Arthritis    Asthma    Cataract    cervical ca 1990   Depression    Diabetes (HCC)    Dyspnea    Hypercholesterolemia    Hypertension    Hypothyroidism    Myocardial infarction (HCC) 2005   Sleep apnea    on CPAP 14 mm PS since February 2017   Stroke [facility])  2007   weakness on R side   Vitamin D  deficiency     Surgical History: Past Surgical History:  Procedure Laterality Date   ABDOMINAL HYSTERECTOMY     BARIATRIC SURGERY     BIOPSY  09/17/2020   Procedure: BIOPSY;  Surgeon: Eartha Angelia Sieving, MD;  Location: [facility];  Service: Gastroenterology;;   BIOPSY  12/08/2022   Procedure: BIOPSY;  Surgeon: Eartha Angelia Sieving, MD;  Location: [facility];  Service: Gastroenterology;;   BREAST BIOPSY Right    beningn   CARDIAC CATHETERIZATION N/A 12/24/2015   Procedure: Right/Left Heart Cath and Coronary Angiography;  Surgeon: Candyce GORMAN Reek, MD;  Location: [facility] INVASIVE CV LAB;  Service: Cardiovascular;  Laterality: N/A;   COLONOSCOPY  2018   COLONOSCOPY WITH PROPOFOL  N/A 09/17/2020   Procedure: COLONOSCOPY WITH PROPOFOL ;  Surgeon: Eartha Angelia Sieving, MD;  [facility]: [facility];  Service: Gastroenterology;  Laterality: N/A;  9:00   ESOPHAGOGASTRODUODENOSCOPY (EGD) WITH PROPOFOL  N/A 09/17/2020   Procedure: ESOPHAGOGASTRODUODENOSCOPY (EGD) WITH PROPOFOL ;  Surgeon: Eartha Angelia Sieving, MD;  [facility];  Service: Gastroenterology;  Laterality: N/A;   ESOPHAGOGASTRODUODENOSCOPY (EGD) WITH PROPOFOL  N/A 12/08/2022   Procedure: ESOPHAGOGASTRODUODENOSCOPY (EGD) WITH PROPOFOL ;  Surgeon: Eartha Angelia Sieving, MD;  Location: [facility];  Service: Gastroenterology;  Laterality: N/A;  2:30PM;ASA 3   HERNIA REPAIR     LEFT HEART CATH AND CORONARY ANGIOGRAPHY N/A 07/12/2017   Procedure: LEFT HEART CATH AND CORONARY ANGIOGRAPHY;  Surgeon: Claudene Victory ORN, MD;  Location: [facility] INVASIVE CV LAB;  Service: Cardiovascular;  Laterality: N/A;    Home Medications:  Allergies as of 01/14/2024       Reactions   Chocolate Other (See Comments)   Pt and daughter report it causes dark discoloration on elbows.    Penicillins Rash   Has patient had a PCN reaction causing immediate rash, facial/tongue/throat swelling, SOB  or lightheadedness with hypotension: Yes Has patient had a PCN reaction causing severe rash involving mucus membranes or skin necrosis: No Has patient had a PCN reaction that required hospitalization No Has patient had a PCN reaction occurring within the last 10 years: No. Has not had in 40-50 years If all of the above answers are NO, then may proceed with Cephalosporin use.        Medication List        Accurate as of January 14, 2024  2:26 PM. If you have any questions, ask your nurse or doctor.          Accu-Chek Aviva Plus test strip Generic drug: glucose blood Inject 1 strip as directed daily.   acetaminophen  500 MG tablet Commonly known as: TYLENOL  Take 500 mg by mouth every 6 (six) hours as needed for mild pain or headache. As needed.   albuterol  108 (90 Base) MCG/ACT inhaler Commonly known as: VENTOLIN  HFA Inhale 2 puffs into the lungs every 6 (six) hours as needed for wheezing or shortness of breath.   aspirin  325 MG tablet Take 325 mg by mouth daily.   calcitRIOL 0.25 MCG capsule Commonly known as: ROCALTROL Take 0.25 mcg by mouth daily.   Dexcom G7 Sensor Misc by Does not apply route.   EPINEPHrine 0.3 mg/0.3 mL Soaj injection Commonly known as: EPI-PEN Inject 0.3 mg into the muscle as needed for anaphylaxis.   furosemide  20 MG tablet Commonly known as: LASIX  TAKE 1 TABLET BY MOUTH DAILY.  (MAY TAKE EXTRA 1 TABLET AS  NEEDED FOR SEVERE SWELLING)   Gemtesa  75 MG Tabs Generic drug: Vibegron  Take 1 tablet (75 mg total) by mouth daily.   Lancets Thin Misc Inject 1 Stick into the skin daily.   levETIRAcetam  250 MG tablet Commonly known as: KEPPRA  Take 1 tablet (250 mg total) by mouth 2 (two) times daily.   levothyroxine  88 MCG tablet Commonly known as: SYNTHROID  TAKE 1 TABLET BY MOUTH DAILY  BEFORE BREAKFAST   Multivitamin Adult Chew Chew 1 tablet by mouth daily.   Novofine Pen Needle 32G X 6 MM Misc Generic drug: Insulin  Pen Needle Use  to inject insulin  4 times daily   olmesartan  20 MG tablet Commonly known as: BENICAR  TAKE 1 TABLET BY MOUTH DAILY   omeprazole  40 MG capsule Commonly known as: PRILOSEC TAKE 1 CAPSULE (40 MG TOTAL) BY MOUTH DAILY.   potassium chloride  10 MEQ tablet Commonly known as: KLOR-CON  M TAKE 1 TABLET BY MOUTH DAILY What changed: how much to take   pyridOXINE 50 MG tablet Commonly known as: VITAMIN B6 Take 1 tablet (50 mg total) by mouth daily.   simvastatin  20 MG tablet Commonly known as: ZOCOR  TAKE 1 TABLET BY MOUTH AT  BEDTIME   terbinafine  1 % cream Commonly known as: LamISIL  AT Apply 1 Application topically 2 (two) times  daily.   thiamine  100 MG tablet Commonly known as: Vitamin B-1 Take 1 tablet (100 mg total) by mouth daily.   tirzepatide  15 MG/0.5ML Pen Commonly known as: MOUNJARO  Inject 15 mg into the skin once a week. What changed: when to take this   Tresiba  FlexTouch 200 UNIT/ML FlexTouch Pen Generic drug: insulin  degludec Inject 66 Units into the skin daily at 10 pm. What changed:  how much to take when to take this        Allergies:  Allergies  Allergen Reactions   Chocolate Other (See Comments)    Pt and daughter report it causes dark discoloration on elbows.    Penicillins Rash    Has patient had a PCN reaction causing immediate rash, facial/tongue/throat swelling, SOB or lightheadedness with hypotension: Yes Has patient had a PCN reaction causing severe rash involving mucus membranes or skin necrosis: No Has patient had a PCN reaction that required hospitalization No Has patient had a PCN reaction occurring within the last 10 years: No. Has not had in 40-50 years If all of the above answers are NO, then may proceed with Cephalosporin use.    Family History: Family History  Problem Relation Age of Onset   Cancer Mother    Colon cancer Mother    Diabetes Mother    Hypertension Father    Thyroid  disease Father    Thyroid  cancer Father     Diabetes Father    Esophageal cancer Father    Breast cancer Maternal Aunt    Colon cancer Maternal Aunt    Breast cancer Maternal Aunt    Colon cancer Maternal Grandmother    Stroke Maternal Grandfather    Stomach cancer Paternal Grandfather    Colon cancer Son    Colon polyps Neg Hx    Rectal cancer Neg Hx     Social History:  reports that she has never smoked. She has never been exposed to tobacco smoke. She has never used smokeless tobacco. She reports that she does not currently use alcohol . She reports that she does not use drugs.   Physical Exam: BP (!) 143/78   Pulse 89   Constitutional:  Alert and oriented, No acute distress. HEENT: [place] AT, moist mucus membranes.  Trachea midline, no masses. Cardiovascular: No clubbing, cyanosis, or edema. Respiratory: Normal respiratory effort, no increased work of breathing. GI: Abdomen is soft, nontender, nondistended, no abdominal masses GU: No CVA tenderness Lymph: No cervical or inguinal lymphadenopathy. Skin: No rashes, bruises or suspicious lesions. Neurologic: Grossly intact, no focal deficits, moving all 4 extremities. Psychiatric: Normal mood and affect.  Laboratory Data: Lab Results  Component Value Date   WBC 9.8 11/09/2023   HGB 10.6 (L) 11/09/2023   HCT 35.5 11/09/2023   MCV 86 11/09/2023   PLT 277 11/09/2023    Lab Results  Component Value Date   CREATININE 2.06 (H) 11/09/2023    No results found for: PSA  No results found for: TESTOSTERONE  Lab Results  Component Value Date   HGBA1C 8.2 (H) 10/15/2023    Urinalysis    Component Value Date/Time   COLORURINE YELLOW 10/20/2023 2031   APPEARANCEUR CLEAR 10/20/2023 2031   APPEARANCEUR Clear 07/02/2023 1415   LABSPEC 1.016 10/20/2023 2031   PHURINE 5.0 10/20/2023 2031   GLUCOSEU NEGATIVE 10/20/2023 2031   HGBUR NEGATIVE 10/20/2023 2031   BILIRUBINUR NEGATIVE 10/20/2023 2031   BILIRUBINUR Negative 07/02/2023 1415   KETONESUR NEGATIVE 10/20/2023  2031   PROTEINUR NEGATIVE 10/20/2023 2031  UROBILINOGEN 0.2 03/06/2022 1416   NITRITE NEGATIVE 10/20/2023 2031   LEUKOCYTESUR NEGATIVE 10/20/2023 2031    Lab Results  Component Value Date   LABMICR Comment 07/02/2023   WBCUA 0-5 10/03/2021   LABEPIT 0-10 10/03/2021   BACTERIA Few 10/03/2021    Pertinent Imaging: N/a   Results for orders placed during the hospital encounter of 07/30/23  US  RENAL  Narrative CLINICAL DATA:  Stage 4 chronic kidney disease.  EXAM: RENAL / URINARY TRACT ULTRASOUND COMPLETE  COMPARISON:  October 30, 2022 abdomen ultrasound  FINDINGS: Right Kidney:  Renal measurements: 9.8 x 4.1 x 4.6 cm = volume: 96.9 mL. Echogenicity within normal limits. No mass or hydronephrosis visualized.  Left Kidney:  Renal measurements: 10 x 5.1 x 4.3 cm = volume: 115.3 mL. Echogenicity within normal limits. No mass or hydronephrosis visualized.  Bladder:  Appears normal for degree of bladder distention. Prevoid volume of 61cc and postvoid volume of 28.4 cc.  Other:  None.  IMPRESSION: Normal renal ultrasound.   Electronically Signed By: Craig Farr M.D. On: 07/30/2023 14:27   Assessment & Plan:    1. OAB (overactive bladder) (Primary) With incontinence. Failed meds, botox , PTNS. Will refer to UroGYN at [facility] for a review.  - Urinalysis, Routine w reflex microscopic; Future   No follow-ups on file.  Donnice Brooks, MD  [facility]  [address] [place], [name] [address] [phone]

## 2024-01-30 ENCOUNTER — Ambulatory Visit: Admitting: Neurology

## 2024-01-30 ENCOUNTER — Encounter: Payer: Self-pay | Admitting: Neurology

## 2024-01-30 VITALS — BP 173/79 | HR 79 | Ht 60.0 in | Wt 265.0 lb

## 2024-01-30 DIAGNOSIS — R29898 Other symptoms and signs involving the musculoskeletal system: Secondary | ICD-10-CM

## 2024-01-30 DIAGNOSIS — Z8673 Personal history of transient ischemic attack (TIA), and cerebral infarction without residual deficits: Secondary | ICD-10-CM

## 2024-01-30 DIAGNOSIS — R569 Unspecified convulsions: Secondary | ICD-10-CM

## 2024-01-30 MED ORDER — LEVETIRACETAM 250 MG PO TABS: ORAL_TABLET | ORAL | 0 refills | Status: AC

## 2024-01-30 MED ORDER — LACOSAMIDE 50 MG PO TABS: ORAL_TABLET | ORAL | 0 refills | Status: DC

## 2024-01-30 MED ORDER — LACOSAMIDE 50 MG PO TABS: ORAL_TABLET | ORAL | 3 refills | Status: AC

## 2024-01-30 NOTE — Patient Instructions (Signed)
 Good to meet you.  Schedule head CT without contrast at [facility]   2. Start Lacosamide 50mg : take 1 tablet twice a day for 1 week, then increase to 1 tablet every morning, 2 tablets every evening  3. Continue Keppra  (Levetiracetam ) 500mg : take 1 tablet twice a day for another 2 weeks as you start the new medication. After 1 week of increasing dose of Lacosamide, reduce Keppra  to 1 tablet every night for 1 week, then afterwards stop Keppra   4. Follow-up in 3 months, call for any changes   Seizure Precautions: 1. If medication has been prescribed for you to prevent seizures, take it exactly as directed.  Do not stop taking the medicine without talking to your doctor first, even if you have not had a seizure in a long time.   2. Avoid activities in which a seizure would cause danger to yourself or to others.  Don't operate dangerous machinery, swim alone, or climb in high or dangerous places, such as on ladders, roofs, or girders.  Do not drive unless your doctor says you may.  3. If you have any warning that you may have a seizure, lay down in a safe place where you can't hurt yourself.    4.  No driving for 6 months from last seizure, as per [place]  state law.   Please refer to the following link on the [employer]ile[employer]'s website for more information: http://www.epilepsyfoundation.org/answerplace/Social/driving/drivingu.cfm   5.  Maintain good sleep hygiene. Avoid alcohol   6.  Contact your doctor if you have any problems that may be related to the medicine you are taking.  7.  Call 911 and bring the patient back to the ED if:        A.  The seizure lasts longer than 5 minutes.       B.  The patient doesn't awaken shortly after the seizure  C.  The patient has new problems such as difficulty seeing, speaking or moving  D.  The patient was injured during the seizure  E.  The patient has a temperature over 102 F (39C)  F.  The patient vomited and now is having  trouble breathing

## 2024-01-30 NOTE — Progress Notes (Unsigned)
 Jessica Strong NOTE  Alle  [name] MRN: [id number] DOB: May 13, 1945  Referring provider: Dr. Lonni Dalton Primary care provider: Meade Gerlach, FNP  Reason for consult:  seizure, memory loss  Dear Dr Dalton:  Thank you for your kind referral of Jessica  KAMYRAH Strong for Strong of the above symptoms. Although her history is well known to you, please allow me to reiterate it for the purpose of our medical record. The patient was accompanied to the clinic by her husband Seena who also provides collateral information. Records and images were personally reviewed where available.  HISTORY OF PRESENT ILLNESS: This is a pleasant 78 year old right-handed woman with a history of hypertension, DM2, hypothyroidism, CKD stage IV, OSA, stroke in 2007 with residual mild right-sided weakness, s/p clipping of cerebral aneurysm in 2007, presenting for evaluation of seizure and memory loss. She was in her usual state of health until 10/20/23, she recalls being at a cookout then does not have much recollection of events. She does not recall being in the hospital. Her husband recalls she was seasoning a steak talking when her head turned to the right, she started looking around, answering no but he could tell something was wrong. She said she wanted to lay down, when her daughter spoke to her, she did not recognize her or her husband. There was report of possible eyelid twitching, and twisting of her face with jaw pulling to the left side, but her husband later on denied this. There was no motor component witnessed. Aneurysm clips were not MRI compatible, serial CT head scans did not show any acute changes, there was left frontal and temporal encephalomalacia and multiple old cerebellar infarcts, aneurysm clips at the left carotid terminus. Her husband reports she did not recognize him until 2 days later. Routine and overnight EEG 6/26 to 6/27 were normal. However due to suspicion for seizure, she  was discharged home on Levetiracetam  250mg  BID.   No further similar episodes since 6/21, however her memory has been bad since hospital discharge. They report that prior to hospitalization, memory was not as bad. She reports that she continues to manage finances and medications, denies forgetting doses. She has not been driving, but denies getting lost prior to hospitalization. She states I forget everything. She denies leaving the stove on, she does not cook much. She is independent with dressing and bathing. Her husband notes her memory is not like it was. He notes a change in personality, she does not talk very much anymore. She used to laugh and joke around. She states mood has always been a mellow person, but her husband notes now it does not take much to get her upset. She does not notice this herself. No paranoia or hallucinations. She feels that since the episode in June, she has noticed her left leg is weaker, she can't get into the bed with her left leg. She denies any paresthesias but states there is no feeling in her left leg when walking. She uses a cane at home, no falls.   Her husband reports episodes of staring, sometimes not responding, occurring 1-2 times a week. It takes 10-15 minutes before she responds. She states I call it daydreaming. She denies any olfactory/gustatory hallucinations, deja vu, rising epigastric sensation,  myoclonic jerks. She denies any headaches, dizziness, diplopia, dysarthria/dysphagia, neck/back pain, bowel/bladder dysfunction. She gets 8-9 hours of sleep. No family history of memory loss.   Epilepsy Risk Factors:  left frontal and temporal encephalomalacia. She had a normal  birth and early development.  There is no history of febrile convulsions, CNS infections such as meningitis/encephalitis, significant traumatic brain injury, or family history of seizures.  Lab Results  Component Value Date   TSH 1.256 10/20/2023   Lab Results  Component Value Date    VITAMINB12 717 10/21/2023     PAST MEDICAL HISTORY: Past Medical History:  Diagnosis Date   Allergy    Anemia    Anxiety    Arthritis    Asthma    Cataract    cervical ca 1990   Depression    Diabetes (HCC)    Dyspnea    Hypercholesterolemia    Hypertension    Hypothyroidism    Myocardial infarction (HCC) 2005   Seizures (HCC)    Sleep apnea    on CPAP 14 mm PS since February 2017   Stroke [facility]) 2007   weakness on R side   Vitamin D  deficiency     PAST SURGICAL HISTORY: Past Surgical History:  Procedure Laterality Date   ABDOMINAL HYSTERECTOMY     BARIATRIC SURGERY     BIOPSY  09/17/2020   Procedure: BIOPSY;  Surgeon: Eartha Angelia Sieving, MD;  Location: [facility];  Service: Gastroenterology;;   BIOPSY  12/08/2022   Procedure: BIOPSY;  Surgeon: Eartha Angelia Sieving, MD;  Location: [facility];  Service: Gastroenterology;;   BREAST BIOPSY Right    beningn   CARDIAC CATHETERIZATION N/A 12/24/2015   Procedure: Right/Left Heart Cath and Coronary Angiography;  Surgeon: Candyce GORMAN Reek, MD;  Location: [facility] INVASIVE CV LAB;  Service: Cardiovascular;  Laterality: N/A;   COLONOSCOPY  2018   COLONOSCOPY WITH PROPOFOL  N/A 09/17/2020   Procedure: COLONOSCOPY WITH PROPOFOL ;  Surgeon: Eartha Angelia Sieving, MD;  Location: [facility];  Service: Gastroenterology;  Laterality: N/A;  9:00   ESOPHAGOGASTRODUODENOSCOPY (EGD) WITH PROPOFOL  N/A 09/17/2020   Procedure: ESOPHAGOGASTRODUODENOSCOPY (EGD) WITH PROPOFOL ;  Surgeon: Eartha Angelia Sieving, MD;  Location: [facility];  Service: Gastroenterology;  Laterality: N/A;   ESOPHAGOGASTRODUODENOSCOPY (EGD) WITH PROPOFOL  N/A 12/08/2022   Procedure: ESOPHAGOGASTRODUODENOSCOPY (EGD) WITH PROPOFOL ;  Surgeon: Eartha Angelia Sieving, MD;  Location: [facility];  Service: Gastroenterology;  Laterality: N/A;  2:30PM;ASA 3   HERNIA REPAIR     LEFT HEART CATH AND CORONARY ANGIOGRAPHY N/A 07/12/2017   Procedure:  LEFT HEART CATH AND CORONARY ANGIOGRAPHY;  Surgeon: Claudene Victory ORN, MD;  Location: [facility] INVASIVE CV LAB;  Service: Cardiovascular;  Laterality: N/A;    MEDICATIONS: Current Outpatient Medications on File Prior to Visit  Medication Sig Dispense Refill   ACCU-CHEK AVIVA PLUS test strip Inject 1 strip as directed daily.  4   acetaminophen  (TYLENOL ) 500 MG tablet Take 500 mg by mouth every 6 (six) hours as needed for mild pain or headache. As needed.     albuterol  (VENTOLIN  HFA) 108 (90 Base) MCG/ACT inhaler Inhale 2 puffs into the lungs every 6 (six) hours as needed for wheezing or shortness of breath. 8 g 2   aspirin  325 MG tablet Take 325 mg by mouth daily.     calcitRIOL (ROCALTROL) 0.25 MCG capsule Take 0.25 mcg by mouth daily.     Continuous Glucose Sensor (DEXCOM G7 SENSOR) MISC by Does not apply route.     EPINEPHrine 0.3 mg/0.3 mL IJ SOAJ injection Inject 0.3 mg into the muscle as needed for anaphylaxis.     furosemide  (LASIX ) 20 MG tablet TAKE 1 TABLET BY MOUTH DAILY.  (MAY TAKE EXTRA 1 TABLET AS  NEEDED FOR SEVERE SWELLING) 200 tablet 2   Lancets Thin MISC Inject 1 Stick into the skin daily.     levETIRAcetam  (KEPPRA ) 250 MG tablet Take 1 tablet (250 mg total) by mouth 2 (two) times daily. 180 tablet 0   levothyroxine  (SYNTHROID ) 88 MCG tablet TAKE 1 TABLET BY MOUTH DAILY  BEFORE BREAKFAST 100 tablet 2   MOUNJARO  15 MG/0.5ML Pen INJECT THE CONTENTS OF ONE PEN  SUBCUTANEOUSLY WEEKLY AS  DIRECTED 6 mL 3   Multiple Vitamins-Minerals (MULTIVITAMIN ADULT) CHEW Chew 1 tablet by mouth daily.     NOVOFINE PEN NEEDLE 32G X 6 MM MISC Use to inject insulin  4 times daily 200 each 2   olmesartan  (BENICAR ) 20 MG tablet TAKE 1 TABLET BY MOUTH DAILY 100 tablet 2   omeprazole  (PRILOSEC) 40 MG capsule TAKE 1 CAPSULE (40 MG TOTAL) BY MOUTH DAILY. 90 capsule 0   potassium chloride  (KLOR-CON  M) 10 MEQ tablet TAKE 1 TABLET BY MOUTH DAILY 100 tablet 2   pyridOXINE (VITAMIN B6) 50 MG tablet Take 1 tablet (50 mg  total) by mouth daily. 30 tablet 1   simvastatin  (ZOCOR ) 20 MG tablet TAKE 1 TABLET BY MOUTH AT  BEDTIME 100 tablet 2   terbinafine  (LAMISIL  AT) 1 % cream Apply 1 Application topically 2 (two) times daily. 42 g 1   thiamine  (VITAMIN B-1) 100 MG tablet Take 1 tablet (100 mg total) by mouth daily.     TRESIBA  FLEXTOUCH 200 UNIT/ML FlexTouch Pen Inject 66 Units into the skin daily at 10 pm. 45 mL 3   Vibegron  (GEMTESA ) 75 MG TABS Take 1 tablet (75 mg total) by mouth daily. 30 tablet 11   No current facility-administered medications on file prior to visit.    ALLERGIES: Allergies  Allergen Reactions   Chocolate Other (See Comments)    Pt and daughter report it causes dark discoloration on elbows.    Peanut-Containing Drug Products Rash and Dermatitis   Penicillins Rash    Has patient had a PCN reaction causing immediate rash, facial/tongue/throat swelling, SOB or lightheadedness with hypotension: Yes Has patient had a PCN reaction causing severe rash involving mucus membranes or skin necrosis: No Has patient had a PCN reaction that required hospitalization No Has patient had a PCN reaction occurring within the last 10 years: No. Has not had in 40-50 years If all of the above answers are NO, then may proceed with Cephalosporin use.    FAMILY HISTORY: Family History  Problem Relation Age of Onset   Cancer Mother    Colon cancer Mother    Diabetes Mother    Hypertension Father    Thyroid  disease Father    Thyroid  cancer Father    Diabetes Father    Esophageal cancer Father    Breast cancer Maternal Aunt    Colon cancer Maternal Aunt    Breast cancer Maternal Aunt    Colon cancer Maternal Grandmother    Stroke Maternal Grandfather    Stomach cancer Paternal Grandfather    Colon cancer Son    Colon polyps Neg Hx    Rectal cancer Neg Hx     SOCIAL HISTORY: Social History   Socioeconomic History   Marital status: Married    Spouse name: Not on file   Number of children: 2    Years of education: Not on file   Highest education level: Not on file  Occupational History   Occupation: retired  Tobacco Use   Smoking status: Never  Passive exposure: Never   Smokeless tobacco: Never  Vaping Use   Vaping status: Never Used  Substance and Sexual Activity   Alcohol  use: Not Currently    Comment: occasionally   Drug use: No   Sexual activity: Never    Partners: Male  Other Topics Concern   Not on file  Social History Narrative   Living with husband, 2 story home, 10 steps   Right hand   Coffee 1 cup a day   Social Drivers of Corporate investment banker Strain: Low Risk  (11/30/2022)   Overall Financial Resource Strain (CARDIA)    Difficulty of Paying Living Expenses: Not hard at all  Food Insecurity: No Food Insecurity (10/21/2023)   Hunger Vital Sign    Worried About Running Out of Food in the Last Year: Never true    Ran Out of Food in the Last Year: Never true  Transportation Needs: No Transportation Needs (10/21/2023)   PRAPARE - Transportation    Lack of Transportation (Medical): No    Lack of Transportation (Non-Medical): No  Physical Activity: Inactive (11/30/2022)   Exercise Vital Sign    Days of Exercise per Week: 0 days    Minutes of Exercise per Session: 0 min  Stress: No Stress Concern Present (11/30/2022)   [employer] of Occupational Health - Occupational Stress Questionnaire    Feeling of Stress : Not at all  Social Connections: Moderately Isolated (10/21/2023)   Social Connection and Isolation Panel    Frequency of Communication with Friends and Family: More than three times a week    Frequency of Social Gatherings with Friends and Family: Three times a week    Attends Religious Services: Never    Active Member of Clubs or Organizations: No    Attends Banker Meetings: Never    Marital Status: Married  Catering manager Violence: Patient Unable To Answer (10/21/2023)   Humiliation, Afraid, Rape, and Kick questionnaire     Fear of Current or Ex-Partner: Patient unable to answer    Emotionally Abused: Patient unable to answer    Physically Abused: Patient unable to answer    Sexually Abused: Patient unable to answer     PHYSICAL EXAM: Vitals:   01/30/24 1331  BP: (!) 173/79  Pulse: 79  SpO2: 97%   General: No acute distress Head:  Normocephalic/atraumatic Skin/Extremities: No rash, no edema Neurological Exam: Mental status: alert and oriented to person, place, and time, no dysarthria or aphasia, Fund of knowledge is appropriate.  Recent and remote memory are impaired. Attention and concentration are normal.    Able to name objects and repeat phrases. MMSE 26/30    01/30/2024    2:00 PM  MMSE - Mini Mental State Exam  Orientation to time 5  Orientation to Place 5  Registration 3  Attention/ Calculation 5  Recall 0  Language- name 2 objects 2  Language- repeat 1  Language- follow 3 step command 3  Language- read & follow direction 1  Write a sentence 1  Copy design 0  Total score 26   Cranial nerves: CN I: not tested CN II: pupils equal, round, visual fields intact CN III, IV, VI:  full range of motion, no nystagmus, no ptosis CN V: decreased temperature on right V1-3 CN VII: upper and lower face symmetric CN VIII: hearing intact to conversation Bulk & Tone: normal, no fasciculations. Motor: 5/5 throughout with no pronator drift. Sensation: decreased temperature on right UE, left LE. Intact pin  and vibration sense. Romberg test negative Deep Tendon Reflexes: unable to elicit throughout Cerebellar: no incoordination on finger to nose testing Gait: slow and cautious reporting she does not feel the left leg, no ataxia Tremor: none   IMPRESSION: This is a pleasant 78 year old right-handed woman with a history of hypertension, DM2, hypothyroidism, CKD stage IV, OSA, stroke in 2007 with residual mild right-sided weakness, s/p clipping of cerebral aneurysm in 2007, presenting for  evaluation of seizure and memory loss. She had an episode suggestive of focal impaired awareness seizure last 10/20/23. Head CT showed left temporal and frontal encephalomalacia, overnight EEG normal. She reports the left leg has been numb since the seizure, indicating right-sided pathology. Repeat head CT without contrast will be ordered. No seizures since 09/2023 however it appears she is having personality changes on low dose Levetiracetam . We discussed switching to Lacosamide, uptitration schedule discussed as well as side effects. She will continue Levetiracetam  for another 2 weeks, then weaning instructions provided. MMSE today 26/30, she denies any difficulties with complex tasks.  [place] driving laws were discussed with the patient, and she knows to stop driving after a seizure, until 6 months seizure-free. Follow-up in 3 months, call for any changes.   Thank you for allowing me to participate in the care of this patient. Please do not hesitate to call for any questions or concerns.   Darice Shivers, M.D.  CC: Dr. Jonel, Meade Gerlach, FNP

## 2024-01-31 ENCOUNTER — Other Ambulatory Visit (HOSPITAL_COMMUNITY): Payer: Self-pay

## 2024-01-31 ENCOUNTER — Telehealth: Payer: Self-pay | Admitting: Pharmacy Technician

## 2024-01-31 NOTE — Telephone Encounter (Signed)
 Pharmacy Patient Advocate Encounter   Received notification from CoverMyMeds that prior authorization for LACOSAMIDE 50MG  is required/requested.   Insurance verification completed.   The patient is insured through [facility].   Per test claim: PA required; PA submitted to above mentioned insurance via Latent Key/confirmation #/EOC [id number] Status is pending

## 2024-01-31 NOTE — Telephone Encounter (Signed)
 Pharmacy Patient Advocate Encounter  Received notification from [employer] that Prior Authorization for LACOSAMIDE 50MG  has been APPROVED from 10.2.25 to 12.31.26. Ran test claim, Copay is $0. This test claim was processed through [facility]- copay amounts may vary at other pharmacies due to pharmacy/plan contracts, or as the patient moves through the different stages of their insurance plan.   PA #/Case ID/Reference #: [id number]

## 2024-02-06 ENCOUNTER — Encounter: Payer: Self-pay | Admitting: Physician Assistant

## 2024-02-06 ENCOUNTER — Ambulatory Visit: Admitting: Physician Assistant

## 2024-02-06 VITALS — BP 119/73 | HR 71

## 2024-02-06 DIAGNOSIS — D489 Neoplasm of uncertain behavior, unspecified: Secondary | ICD-10-CM | POA: Diagnosis not present

## 2024-02-06 DIAGNOSIS — D2239 Melanocytic nevi of other parts of face: Secondary | ICD-10-CM | POA: Diagnosis not present

## 2024-02-06 DIAGNOSIS — B079 Viral wart, unspecified: Secondary | ICD-10-CM

## 2024-02-06 DIAGNOSIS — D239 Other benign neoplasm of skin, unspecified: Secondary | ICD-10-CM

## 2024-02-06 NOTE — Patient Instructions (Addendum)

## 2024-02-06 NOTE — Progress Notes (Signed)
   New Patient Visit   Subjective  Jessica  CHANEY Strong is a 78 y.o. female NEW PATIENT who presents for the following: skin lesions  Patient states she has two skin lesions located at the face that she would like to have examined. Patient reports the areas have been there for MANY years for the spot in the middle of her eyes and the one on her left cheek popped up about 6 months ago. She reports the areas are not bothersome. She states that the areas  spread. Patient reports she has not previously been treated for these areas. Patient denies Hx of bx. Patient denies family/self history of skin  cancer(s).  Accompanied by husband   The following portions of the chart were reviewed this encounter and updated as appropriate: medications, allergies, medical history  Review of Systems:  No other skin or systemic complaints except as noted in HPI or Assessment and Plan.  Objective  Well appearing patient in no apparent distress; mood and affect are within normal limits.  A focused examination was performed of the following areas: Face  Relevant exam findings are noted in the Assessment and Plan.       Left inferior cheek 0.5 cm cutaneous horn    Assessment & Plan   INTRADERMAL NEVUS - glabella  - reassurance that this is a benign lesion  - no treatment necessary     NEOPLASM OF UNCERTAIN BEHAVIOR Left inferior cheek Skin / nail biopsy Type of biopsy: tangential   Informed consent: discussed and consent obtained   Timeout: patient name, date of birth, surgical site, and procedure verified   Procedure prep:  Patient was prepped and draped in usual sterile fashion Prep type:  Isopropyl alcohol  Anesthesia: the lesion was anesthetized in a standard fashion   Anesthetic:  1% lidocaine  w/ epinephrine 1-100,000 buffered w/ 8.4% NaHCO3 Instrument used: flexible razor blade   Hemostasis achieved with: pressure, aluminum chloride and electrodesiccation   Outcome: patient tolerated  procedure well   Post-procedure details: sterile dressing applied and wound care instructions given   Dressing type: bandage and petrolatum    Specimen 1 - Surgical pathology Differential Diagnosis: R/O WART VS SCC VS ISK  Check Margins: No INTRADERMAL NEVUS    Return if symptoms worsen or fail to improve.  I, Doyce Pan, CMA, am acting as scribe for Shantoya Geurts K, PA-C.   Documentation: I have reviewed the above documentation for accuracy and completeness, and I agree with the above.  Caylan Schifano K, PA-C

## 2024-02-07 LAB — SURGICAL PATHOLOGY

## 2024-02-11 ENCOUNTER — Ambulatory Visit: Payer: Self-pay | Admitting: Physician Assistant

## 2024-02-11 ENCOUNTER — Encounter: Payer: Self-pay | Admitting: Family Medicine

## 2024-02-11 ENCOUNTER — Ambulatory Visit (INDEPENDENT_AMBULATORY_CARE_PROVIDER_SITE_OTHER): Admitting: Family Medicine

## 2024-02-11 VITALS — BP 150/85 | HR 76 | Resp 18 | Ht 60.0 in | Wt 263.1 lb

## 2024-02-11 DIAGNOSIS — E1143 Type 2 diabetes mellitus with diabetic autonomic (poly)neuropathy: Secondary | ICD-10-CM

## 2024-02-11 DIAGNOSIS — E559 Vitamin D deficiency, unspecified: Secondary | ICD-10-CM

## 2024-02-11 DIAGNOSIS — Z794 Long term (current) use of insulin: Secondary | ICD-10-CM

## 2024-02-11 DIAGNOSIS — E038 Other specified hypothyroidism: Secondary | ICD-10-CM

## 2024-02-11 DIAGNOSIS — B351 Tinea unguium: Secondary | ICD-10-CM | POA: Diagnosis not present

## 2024-02-11 DIAGNOSIS — E1122 Type 2 diabetes mellitus with diabetic chronic kidney disease: Secondary | ICD-10-CM | POA: Diagnosis not present

## 2024-02-11 DIAGNOSIS — R7301 Impaired fasting glucose: Secondary | ICD-10-CM

## 2024-02-11 DIAGNOSIS — I10 Essential (primary) hypertension: Secondary | ICD-10-CM

## 2024-02-11 DIAGNOSIS — E7849 Other hyperlipidemia: Secondary | ICD-10-CM

## 2024-02-11 DIAGNOSIS — N183 Chronic kidney disease, stage 3 unspecified: Secondary | ICD-10-CM

## 2024-02-11 MED ORDER — TERBINAFINE HCL 1 % EX CREA: 1.0000 | TOPICAL_CREAM | Freq: Two times a day (BID) | CUTANEOUS | 1 refills | Status: AC

## 2024-02-11 MED ORDER — AMLODIPINE BESYLATE 5 MG PO TABS: 5.0000 mg | ORAL_TABLET | Freq: Every day | ORAL | 1 refills | Status: AC

## 2024-02-11 NOTE — Progress Notes (Unsigned)
150/85  

## 2024-02-11 NOTE — Patient Instructions (Addendum)
 I appreciate the opportunity to provide care to you today!    Follow up:  3 months ( 4 weeks for nurse visit to reassess BP)  Labs: please stop by the lab today to get your blood drawn (CBC, CMP, TSH, Lipid profile, HgA1c, Vit D)  Hypertension Management -Your current blood pressure is above the target goal of <140/90 mmHg.  Continue taking Olmesartan  20 mg daily and Lasix  20 mg daily.  Amlodipine 5 mg daily has been added to your regimen to help achieve better blood pressure control.  Please monitor your blood pressure at home regularly, follow a low-sodium diet, and increase physical activity as tolerated. Report any symptoms such as dizziness, swelling in the legs, or lightheadedness to your provider.  Medication Instructions: Take your blood pressure medication at the same time each day. After taking your medication, check your blood pressure at least an hour later. If your first reading is >140/90 mmHg, wait at least 10 minutes and recheck your blood pressure. Side Effects: In the initial days of therapy, you may experience dizziness or lightheadedness as your body adjusts to the lower blood pressure; this is expected. Diet and Lifestyle: Adhere to a low-sodium diet, limiting intake to less than 1500 mg daily, and increase your physical activity. Avoid over-the-counter NSAIDs such as ibuprofen and naproxen while on this medication. Hydration and Nutrition: Stay well-hydrated by drinking at least 64 ounces of water daily. Increase your servings of fruits and vegetables and avoid excessive sodium in your diet. Long-Term Considerations: Uncontrolled hypertension can increase the risk of cardiovascular diseases, including stroke, coronary artery disease, and heart failure.  Please report to the emergency department if your blood pressure exceeds 180/120 and is accompanied by symptoms such as headaches, chest pain, palpitations, blurred vision, or dizziness.    Toenail Fungus A refill of  your prescription for Lamisil  (terbinafine ) 1% cream has been sent to your pharmacy. Please apply topically to the affected area  twice daily as directed until clears.  Please follow up if your symptoms worsen or fail to improve.   Attached with your AVS, you will find valuable resources for self-education. I highly recommend dedicating some time to thoroughly examine them.   Please continue to a heart-healthy diet and increase your physical activities. Try to exercise for at least five days a week.    It was a pleasure to see you and I look forward to continuing to work together on your health and well-being. Please do not hesitate to call the office if you need care or have questions about your care.  In case of emergency, please visit the Emergency Department for urgent care, or contact our clinic at [phone] to schedule an appointment. We're here to help you!   Have a wonderful day and week. With Gratitude, Meade JENEANE Gerlach MSN, FNP-BC, PMHNP-BC

## 2024-02-11 NOTE — Progress Notes (Unsigned)
 Established Patient Office Visit  Subjective:  Patient ID: Jessica Strong  Jessica Strong Strong, female    DOB: 04/25/1946  Age: 78 y.o. MRN: [id number]  CC:  Chief Complaint  Patient presents with   Hypertension    4 month follow up     HPI Jessica Strong  Jessica Strong Strong is a 77 y.o. female with past medical history of hypertension, type 2 diabetes, hyperlipidemia presents for f/u of  chronic medical conditions. For the details of today's visit, please refer to the assessment and plan.    Past Medical History:  Diagnosis Date   Allergy    Anemia    Anxiety    Arthritis    Asthma    Cataract    cervical ca 1990   Depression    Diabetes (HCC)    Dyspnea    Hypercholesterolemia    Hypertension    Hypothyroidism    Myocardial infarction (HCC) 2005   Seizures (HCC)    Sleep apnea    on CPAP 14 mm PS since February 2017   Stroke [facility]) 2007   weakness on R side   Vitamin D  deficiency     Past Surgical History:  Procedure Laterality Date   ABDOMINAL HYSTERECTOMY     BARIATRIC SURGERY     BIOPSY  09/17/2020   Procedure: BIOPSY;  Surgeon: Eartha Angelia Sieving, MD;  Location: [facility];  Service: Gastroenterology;;   BIOPSY  12/08/2022   Procedure: BIOPSY;  Surgeon: Eartha Angelia Sieving, MD;  Location: [facility];  Service: Gastroenterology;;   BREAST BIOPSY Right    beningn   CARDIAC CATHETERIZATION N/A 12/24/2015   Procedure: Right/Left Heart Cath and Coronary Angiography;  Surgeon: Candyce GORMAN Reek, MD;  Location: [facility] INVASIVE CV LAB;  Service: Cardiovascular;  Laterality: N/A;   COLONOSCOPY  2018   COLONOSCOPY WITH PROPOFOL  N/A 09/17/2020   Procedure: COLONOSCOPY WITH PROPOFOL ;  Surgeon: Eartha Angelia Sieving, MD;  Location: [facility];  Service: Gastroenterology;  Laterality: N/A;  9:00   ESOPHAGOGASTRODUODENOSCOPY (EGD) WITH PROPOFOL  N/A 09/17/2020   Procedure: ESOPHAGOGASTRODUODENOSCOPY (EGD) WITH PROPOFOL ;  Surgeon: Eartha Angelia Sieving, MD;  Location: [facility];   Service: Gastroenterology;  Laterality: N/A;   ESOPHAGOGASTRODUODENOSCOPY (EGD) WITH PROPOFOL  N/A 12/08/2022   Procedure: ESOPHAGOGASTRODUODENOSCOPY (EGD) WITH PROPOFOL ;  Surgeon: Eartha Angelia Sieving, MD;  Location: [facility];  Service: Gastroenterology;  Laterality: N/A;  2:30PM;ASA 3   HERNIA REPAIR     LEFT HEART CATH AND CORONARY ANGIOGRAPHY N/A 07/12/2017   Procedure: LEFT HEART CATH AND CORONARY ANGIOGRAPHY;  Surgeon: Claudene Victory ORN, MD;  Location: [facility] INVASIVE CV LAB;  Service: Cardiovascular;  Laterality: N/A;    Family History  Problem Relation Age of Onset   Cancer Mother    Colon cancer Mother    Diabetes Mother    Hypertension Father    Thyroid  disease Father    Thyroid  cancer Father    Diabetes Father    Esophageal cancer Father    Breast cancer Maternal Aunt    Colon cancer Maternal Aunt    Breast cancer Maternal Aunt    Colon cancer Maternal Grandmother    Stroke Maternal Grandfather    Stomach cancer Paternal Grandfather    Colon cancer Son    Colon polyps Neg Hx    Rectal cancer Neg Hx     Social History   Socioeconomic History   Marital status: Married    Spouse name: Not on file   Number of children: 2   Years of  education: Not on file   Highest education level: Not on file  Occupational History   Occupation: retired  Tobacco Use   Smoking status: Never    Passive exposure: Never   Smokeless tobacco: Never  Vaping Use   Vaping status: Never Used  Substance and Sexual Activity   Alcohol  use: Not Currently    Comment: occasionally   Drug use: No   Sexual activity: Never    Partners: Male  Other Topics Concern   Not on file  Social History Narrative   Living with husband, 2 story home, 10 steps   Right hand   Coffee 1 cup a day   Social Drivers of Corporate investment banker Strain: Low Risk  (11/30/2022)   Overall Financial Resource Strain (CARDIA)    Difficulty of Paying Living Expenses: Not hard at all  Food Insecurity: No Food  Insecurity (10/21/2023)   Hunger Vital Sign    Worried About Running Out of Food in the Last Year: Never true    Ran Out of Food in the Last Year: Never true  Transportation Needs: No Transportation Needs (10/21/2023)   PRAPARE - Transportation    Lack of Transportation (Medical): No    Lack of Transportation (Non-Medical): No  Physical Activity: Inactive (11/30/2022)   Exercise Vital Sign    Days of Exercise per Week: 0 days    Minutes of Exercise per Session: 0 min  Stress: No Stress Concern Present (11/30/2022)   [employer] of Occupational Health - Occupational Stress Questionnaire    Feeling of Stress : Not at all  Social Connections: Moderately Isolated (10/21/2023)   Social Connection and Isolation Panel    Frequency of Communication with Friends and Family: More than three times a week    Frequency of Social Gatherings with Friends and Family: Three times a week    Attends Religious Services: Never    Active Member of Clubs or Organizations: No    Attends Banker Meetings: Never    Marital Status: Married  Catering manager Violence: Patient Unable To Answer (10/21/2023)   Humiliation, Afraid, Rape, and Kick questionnaire    Fear of Current or Ex-Partner: Patient unable to answer    Emotionally Abused: Patient unable to answer    Physically Abused: Patient unable to answer    Sexually Abused: Patient unable to answer    Outpatient Medications Prior to Visit  Medication Sig Dispense Refill   ACCU-CHEK AVIVA PLUS test strip Inject 1 strip as directed daily.  4   acetaminophen  (TYLENOL ) 500 MG tablet Take 500 mg by mouth every 6 (six) hours as needed for mild pain or headache. As needed.     albuterol  (VENTOLIN  HFA) 108 (90 Base) MCG/ACT inhaler Inhale 2 puffs into the lungs every 6 (six) hours as needed for wheezing or shortness of breath. 8 g 2   aspirin  325 MG tablet Take 325 mg by mouth daily.     calcitRIOL (ROCALTROL) 0.25 MCG capsule Take 0.25 mcg by mouth  daily.     Continuous Glucose Sensor (DEXCOM G7 SENSOR) MISC by Does not apply route.     EPINEPHrine 0.3 mg/0.3 mL IJ SOAJ injection Inject 0.3 mg into the muscle as needed for anaphylaxis.     furosemide  (LASIX ) 20 MG tablet TAKE 1 TABLET BY MOUTH DAILY.  (MAY TAKE EXTRA 1 TABLET AS  NEEDED FOR SEVERE SWELLING) 200 tablet 2   lacosamide (VIMPAT) 50 MG TABS tablet After increasing dose as instructed, take  1 tablet in AM, 2 tablets in PM 270 tablet 3   Lancets Thin MISC Inject 1 Stick into the skin daily.     levETIRAcetam  (KEPPRA ) 250 MG tablet Take 1 tablet twice a day 60 tablet 0   levothyroxine  (SYNTHROID ) 88 MCG tablet TAKE 1 TABLET BY MOUTH DAILY  BEFORE BREAKFAST 100 tablet 2   MOUNJARO  15 MG/0.5ML Pen INJECT THE CONTENTS OF ONE PEN  SUBCUTANEOUSLY WEEKLY AS  DIRECTED 6 mL 3   Multiple Vitamins-Minerals (MULTIVITAMIN ADULT) CHEW Chew 1 tablet by mouth daily.     NOVOFINE PEN NEEDLE 32G X 6 MM MISC Use to inject insulin  4 times daily 200 each 2   olmesartan  (BENICAR ) 20 MG tablet TAKE 1 TABLET BY MOUTH DAILY 100 tablet 2   potassium chloride  (KLOR-CON  M) 10 MEQ tablet TAKE 1 TABLET BY MOUTH DAILY 100 tablet 2   pyridOXINE (VITAMIN B6) 50 MG tablet Take 1 tablet (50 mg total) by mouth daily. 30 tablet 1   simvastatin  (ZOCOR ) 20 MG tablet TAKE 1 TABLET BY MOUTH AT  BEDTIME 100 tablet 2   thiamine  (VITAMIN B-1) 100 MG tablet Take 1 tablet (100 mg total) by mouth daily.     TRESIBA  FLEXTOUCH 200 UNIT/ML FlexTouch Pen Inject 66 Units into the skin daily at 10 pm. 45 mL 3   Vibegron  (GEMTESA ) 75 MG TABS Take 1 tablet (75 mg total) by mouth daily. 30 tablet 11   omeprazole  (PRILOSEC) 40 MG capsule TAKE 1 CAPSULE (40 MG TOTAL) BY MOUTH DAILY. (Patient not taking: Reported on 02/11/2024) 90 capsule 0   terbinafine  (LAMISIL  AT) 1 % cream Apply 1 Application topically 2 (two) times daily. (Patient not taking: Reported on 02/11/2024) 42 g 1   No facility-administered medications prior to visit.     Allergies  Allergen Reactions   Chocolate Other (See Comments)    Pt and daughter report it causes dark discoloration on elbows.    Peanut-Containing Drug Products Rash and Dermatitis   Penicillins Rash    Has patient had a PCN reaction causing immediate rash, facial/tongue/throat swelling, SOB or lightheadedness with hypotension: Yes Has patient had a PCN reaction causing severe rash involving mucus membranes or skin necrosis: No Has patient had a PCN reaction that required hospitalization No Has patient had a PCN reaction occurring within the last 10 years: No. Has not had in 40-50 years If all of the above answers are NO, then may proceed with Cephalosporin use.    ROS Review of Systems  Constitutional:  Negative for chills and fever.  Eyes:  Negative for visual disturbance.  Respiratory:  Negative for chest tightness and shortness of breath.   Neurological:  Negative for dizziness and headaches.      Objective:    Physical Exam HENT:     Head: Normocephalic.     Mouth/Throat:     Mouth: Mucous membranes are moist.  Cardiovascular:     Rate and Rhythm: Normal rate.     Heart sounds: Normal heart sounds.  Pulmonary:     Effort: Pulmonary effort is normal.     Breath sounds: Normal breath sounds.  Neurological:     Mental Status: She is alert.     BP (!) 150/85   Pulse 76   Resp 18   Ht 5' (1.524 m)   Wt 263 lb 1.3 oz (119.3 kg)   SpO2 92%   BMI 51.38 kg/m  Wt Readings from Last 3 Encounters:  02/11/24 263 lb 1.3  oz (119.3 kg)  01/30/24 265 lb (120.2 kg)  11/09/23 269 lb (122 kg)    Lab Results  Component Value Date   TSH 1.256 10/20/2023   Lab Results  Component Value Date   WBC 9.8 11/09/2023   HGB 10.6 (L) 11/09/2023   HCT 35.5 11/09/2023   MCV 86 11/09/2023   PLT 277 11/09/2023   Lab Results  Component Value Date   NA 142 11/09/2023   K 5.2 11/09/2023   CO2 20 11/09/2023   GLUCOSE 140 (H) 11/09/2023   BUN 36 (H) 11/09/2023    CREATININE 2.06 (H) 11/09/2023   BILITOT 0.2 10/26/2023   ALKPHOS 91 10/26/2023   AST 18 10/26/2023   ALT 13 10/26/2023   PROT 6.6 10/26/2023   ALBUMIN  3.2 (L) 10/26/2023   CALCIUM  9.3 11/09/2023   ANIONGAP 14 10/26/2023   EGFR 25 (L) 11/09/2023   Lab Results  Component Value Date   CHOL 147 10/15/2023   Lab Results  Component Value Date   HDL 45 10/15/2023   Lab Results  Component Value Date   LDLCALC 67 10/15/2023   Lab Results  Component Value Date   TRIG 214 (H) 10/15/2023   Lab Results  Component Value Date   CHOLHDL 3.3 10/15/2023   Lab Results  Component Value Date   HGBA1C 8.2 (H) 10/15/2023      Assessment & Plan:  Essential hypertension Assessment & Plan: Uncontrolled Hypertension. The patient is asymptomatic in the clinic today. Continue Olmesartan  20 mg daily and Lasix  20 mg daily. Amlodipine 5 mg daily has been added to the regimen to help achieve better blood pressure control. Encourage a low-sodium diet and increased physical activity as tolerated. Follow up in 4 weeks for a nurse visit to reassess blood pressure and monitor treatment effectiveness. Reinforce adherence to medication and lifestyle modifications.   Orders: -     amLODIPine Besylate; Take 1 tablet (5 mg total) by mouth daily.  Dispense: 90 tablet; Refill: 1  Type 2 diabetes mellitus with diabetic autonomic neuropathy, without long-term current use of insulin  (HCC) Assessment & Plan: Encouraged to continue taking Pyridoxine (Vitamin B6) 50 mg daily.    Type 2 diabetes mellitus with stage 3 chronic kidney disease, with long-term current use of insulin , unspecified whether stage 3a or 3b CKD (HCC) Assessment & Plan: Encouraged to continue treatment regimen and follow up with endocrinology   Onychomycosis of right great toe -     Terbinafine  HCl; Apply 1 Application topically 2 (two) times daily.  Dispense: 42 g; Refill: 1  IFG (impaired fasting glucose) -     Hemoglobin  A1c  Vitamin D  deficiency -     VITAMIN D  25 Hydroxy (Vit-D Deficiency, Fractures)  TSH (thyroid -stimulating hormone deficiency) -     TSH + free T4  Other hyperlipidemia -     Lipid panel -     CMP14+EGFR -     CBC with Differential/Platelet  Note: This chart has been completed using Engineer, civil (consulting) software, and while attempts have been made to ensure accuracy, certain words and phrases may not be transcribed as intended.    Follow-up: No follow-ups on file.   Ziah Leandro  Z Bacchus, FNP

## 2024-02-12 NOTE — Assessment & Plan Note (Signed)
 Uncontrolled Hypertension. The patient is asymptomatic in the clinic today. Continue Olmesartan  20 mg daily and Lasix  20 mg daily. Amlodipine 5 mg daily has been added to the regimen to help achieve better blood pressure control. Encourage a low-sodium diet and increased physical activity as tolerated. Follow up in 4 weeks for a nurse visit to reassess blood pressure and monitor treatment effectiveness. Reinforce adherence to medication and lifestyle modifications.

## 2024-02-12 NOTE — Assessment & Plan Note (Signed)
 Encouraged to continue treatment regimen and follow up with endocrinology

## 2024-02-12 NOTE — Assessment & Plan Note (Signed)
 Encouraged to continue taking Pyridoxine (Vitamin B6) 50 mg daily.

## 2024-02-13 ENCOUNTER — Encounter (INDEPENDENT_AMBULATORY_CARE_PROVIDER_SITE_OTHER): Payer: Self-pay | Admitting: Gastroenterology

## 2024-02-21 ENCOUNTER — Ambulatory Visit: Admitting: Nurse Practitioner

## 2024-02-27 ENCOUNTER — Other Ambulatory Visit: Payer: Self-pay | Admitting: Neurology

## 2024-02-29 ENCOUNTER — Telehealth: Payer: Self-pay | Admitting: *Deleted

## 2024-02-29 NOTE — Telephone Encounter (Signed)
 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 They will cover the Toujeo  , Lantus . Currently she is injecting 66 units of Tresiba .  Patient has appointment 05/07/2024.

## 2024-03-03 ENCOUNTER — Other Ambulatory Visit: Payer: Self-pay | Admitting: Family Medicine

## 2024-03-03 DIAGNOSIS — E1101 Type 2 diabetes mellitus with hyperosmolarity with coma: Secondary | ICD-10-CM

## 2024-03-03 MED ORDER — TOUJEO MAX SOLOSTAR 300 UNIT/ML ~~LOC~~ SOPN: 66.0000 [IU] | PEN_INJECTOR | Freq: Every evening | SUBCUTANEOUS | 3 refills | Status: AC

## 2024-03-03 NOTE — Addendum Note (Signed)
 Addended by: Massimiliano Rohleder J on: 03/03/2024 06:55 AM   Modules accepted: Orders

## 2024-03-11 ENCOUNTER — Encounter: Payer: Self-pay | Admitting: Neurology

## 2024-03-12 ENCOUNTER — Ambulatory Visit (HOSPITAL_COMMUNITY)
Admission: RE | Admit: 2024-03-12 | Discharge: 2024-03-12 | Disposition: A | Discharge status: Home / Self Care | Source: Ambulatory Visit | Attending: Neurology | Admitting: Neurology

## 2024-03-12 DIAGNOSIS — Z8673 Personal history of transient ischemic attack (TIA), and cerebral infarction without residual deficits: Secondary | ICD-10-CM | POA: Insufficient documentation

## 2024-03-12 DIAGNOSIS — R569 Unspecified convulsions: Secondary | ICD-10-CM | POA: Diagnosis present

## 2024-03-12 DIAGNOSIS — R29898 Other symptoms and signs involving the musculoskeletal system: Secondary | ICD-10-CM | POA: Insufficient documentation

## 2024-03-17 ENCOUNTER — Ambulatory Visit: Payer: Self-pay | Admitting: Neurology

## 2024-03-20 ENCOUNTER — Telehealth: Payer: Self-pay | Admitting: Nurse Practitioner

## 2024-03-20 NOTE — Telephone Encounter (Signed)
 I sent request through [employer].

## 2024-03-20 NOTE — Telephone Encounter (Signed)
 Patient is requesting a refill on her sensors to [facility].

## 2024-03-21 LAB — CBC WITH DIFFERENTIAL/PLATELET
Basophils Absolute: 0.1 x10E3/uL (ref 0.0–0.2)
Basos: 1 %
EOS (ABSOLUTE): 0.3 x10E3/uL (ref 0.0–0.4)
Eos: 4 %
Hematocrit: 34.2 % (ref 34.0–46.6)
Hemoglobin: 10.7 g/dL — ABNORMAL LOW (ref 11.1–15.9)
Immature Grans (Abs): 0 x10E3/uL (ref 0.0–0.1)
Immature Granulocytes: 0 %
Lymphocytes Absolute: 2.4 x10E3/uL (ref 0.7–3.1)
Lymphs: 29 %
MCH: 26.1 pg — ABNORMAL LOW (ref 26.6–33.0)
MCHC: 31.3 g/dL — ABNORMAL LOW (ref 31.5–35.7)
MCV: 83 fL (ref 79–97)
Monocytes Absolute: 0.8 x10E3/uL (ref 0.1–0.9)
Monocytes: 10 %
Neutrophils Absolute: 4.8 x10E3/uL (ref 1.4–7.0)
Neutrophils: 55 %
Platelets: 299 x10E3/uL (ref 150–450)
RBC: 4.1 x10E6/uL (ref 3.77–5.28)
RDW: 14.1 % (ref 11.7–15.4)
WBC: 8.3 x10E3/uL (ref 3.4–10.8)

## 2024-03-21 LAB — TSH+FREE T4
Free T4: 1.32 ng/dL (ref 0.82–1.77)
TSH: 2.87 u[IU]/mL (ref 0.450–4.500)

## 2024-03-21 LAB — CMP14+EGFR
ALT: 13 IU/L (ref 0–32)
AST: 21 IU/L (ref 0–40)
Albumin: 4.3 g/dL (ref 3.8–4.8)
Alkaline Phosphatase: 108 IU/L (ref 49–135)
BUN/Creatinine Ratio: 15 (ref 12–28)
BUN: 31 mg/dL — ABNORMAL HIGH (ref 8–27)
Bilirubin Total: <0.2 mg/dL (ref 0.0–1.2)
CO2: 21 mmol/L (ref 20–29)
Calcium: 9.3 mg/dL (ref 8.7–10.3)
Chloride: 105 mmol/L (ref 96–106)
Creatinine, Ser: 2.07 mg/dL — ABNORMAL HIGH (ref 0.57–1.00)
Globulin, Total: 2.3 g/dL (ref 1.5–4.5)
Glucose: 83 mg/dL (ref 70–99)
Potassium: 5.2 mmol/L (ref 3.5–5.2)
Sodium: 142 mmol/L (ref 134–144)
Total Protein: 6.6 g/dL (ref 6.0–8.5)
eGFR: 24 mL/min/1.73 — ABNORMAL LOW (ref >59)

## 2024-03-21 LAB — LIPID PANEL
Chol/HDL Ratio: 3.2 ratio (ref 0.0–4.4)
Cholesterol, Total: 146 mg/dL (ref 100–199)
HDL: 46 mg/dL (ref >39)
LDL Chol Calc (NIH): 70 mg/dL (ref 0–99)
Triglycerides: 175 mg/dL — ABNORMAL HIGH (ref 0–149)
VLDL Cholesterol Cal: 30 mg/dL (ref 5–40)

## 2024-03-21 LAB — VITAMIN D 25 HYDROXY (VIT D DEFICIENCY, FRACTURES): Vit D, 25-Hydroxy: 55.8 ng/mL (ref 30.0–100.0)

## 2024-03-21 LAB — HEMOGLOBIN A1C
Est. average glucose Bld gHb Est-mCnc: 169 mg/dL
Hgb A1c MFr Bld: 7.5 % — ABNORMAL HIGH (ref 4.8–5.6)

## 2024-03-21 NOTE — Telephone Encounter (Signed)
 head CT did not show any new changes from her last scan in June. How is she feeling with the Lacosamide  and off Keppra ? Pt stated that its just another pill she doesn't fill anything

## 2024-03-21 NOTE — Telephone Encounter (Signed)
-----   Message from Darice CHRISTELLA Shivers sent at 03/17/2024  9:07 AM EST ----- Pls let her know the head CT did not show any new changes from her last scan in June. How is she feeling with the Lacosamide  and off Keppra ? Thanks ----- Message ----- From: Interface, Rad Results In Sent: 03/16/2024   1:49 AM EST To: Darice CHRISTELLA Shivers, MD

## 2024-03-24 ENCOUNTER — Other Ambulatory Visit: Payer: Self-pay | Admitting: Family Medicine

## 2024-03-24 DIAGNOSIS — E876 Hypokalemia: Secondary | ICD-10-CM

## 2024-04-01 ENCOUNTER — Telehealth: Payer: Self-pay | Admitting: *Deleted

## 2024-04-01 NOTE — Telephone Encounter (Signed)
 The patient left a voicemail that she was in need of her Decom G7 supplies. That her insurance company would not send her any more.  On 03/31/2024 [employer] left a message that they needed to confirm that she was still a patient to this practice. Also, they would need the recent office notes. I called and spoke with the representative. She states that they are going to send a form for us  to document and return back with the requested office notes.  I explained that the patient was last seen in the summer and that she had a follow up for 02/21/24 and had to cancel, currently she has appointment for January 7,2026. I ask that they document this information and that I would also document on the form that they are sending to us .  Attempted to call the patient to inform her and her voicemail has not been set  up.

## 2024-04-03 ENCOUNTER — Telehealth: Payer: Self-pay | Admitting: Nurse Practitioner

## 2024-04-03 ENCOUNTER — Other Ambulatory Visit: Payer: Self-pay | Admitting: Nurse Practitioner

## 2024-04-03 MED ORDER — ACCU-CHEK SOFTCLIX LANCETS MISC: 12 refills | Status: AC

## 2024-04-03 MED ORDER — ACCU-CHEK GUIDE ME W/DEVICE KIT: PACK | 0 refills | Status: AC

## 2024-04-03 MED ORDER — ACCU-CHEK GUIDE TEST VI STRP: ORAL_STRIP | 12 refills | Status: AC

## 2024-04-03 NOTE — Telephone Encounter (Signed)
 Opened to send to [facility], but pt stated she had a meter at home

## 2024-04-03 NOTE — Telephone Encounter (Signed)
 I did send in a new meter anyway to the [facility]

## 2024-04-26 ENCOUNTER — Ambulatory Visit: Payer: Self-pay | Admitting: Family Medicine

## 2024-04-27 ENCOUNTER — Other Ambulatory Visit: Payer: Self-pay | Admitting: Family Medicine

## 2024-04-27 DIAGNOSIS — I10 Essential (primary) hypertension: Secondary | ICD-10-CM

## 2024-05-07 ENCOUNTER — Encounter: Payer: Self-pay | Admitting: Nurse Practitioner

## 2024-05-07 ENCOUNTER — Ambulatory Visit: Admitting: Nurse Practitioner

## 2024-05-07 VITALS — BP 132/80 | HR 92 | Ht 60.0 in | Wt 263.2 lb

## 2024-05-07 DIAGNOSIS — Z7985 Long-term (current) use of injectable non-insulin antidiabetic drugs: Secondary | ICD-10-CM | POA: Diagnosis not present

## 2024-05-07 DIAGNOSIS — N1832 Chronic kidney disease, stage 3b: Secondary | ICD-10-CM

## 2024-05-07 DIAGNOSIS — I10 Essential (primary) hypertension: Secondary | ICD-10-CM | POA: Diagnosis not present

## 2024-05-07 DIAGNOSIS — E039 Hypothyroidism, unspecified: Secondary | ICD-10-CM | POA: Diagnosis not present

## 2024-05-07 DIAGNOSIS — Z808 Family history of malignant neoplasm of other organs or systems: Secondary | ICD-10-CM

## 2024-05-07 DIAGNOSIS — E7849 Other hyperlipidemia: Secondary | ICD-10-CM

## 2024-05-07 DIAGNOSIS — E1122 Type 2 diabetes mellitus with diabetic chronic kidney disease: Secondary | ICD-10-CM | POA: Diagnosis not present

## 2024-05-07 DIAGNOSIS — E782 Mixed hyperlipidemia: Secondary | ICD-10-CM

## 2024-05-07 DIAGNOSIS — Z794 Long term (current) use of insulin: Secondary | ICD-10-CM | POA: Diagnosis not present

## 2024-05-07 NOTE — Progress Notes (Signed)
 "                                                                        Endocrinology Follow Up Note       05/07/2024, 1:58 PM   Subjective:    Patient ID: Jessica  Jessica Strong, female    DOB: 1946/02/15.  Jessica  M Strong is being seen in follow up after being seen in consultation for management of currently uncontrolled symptomatic diabetes requested by  Edman Meade PEDLAR, FNP.   Past Medical History:  Diagnosis Date   Allergy    Anemia    Anxiety    Arthritis    Asthma    Cataract    cervical ca 1990   Depression    Diabetes (HCC)    Dyspnea    Hypercholesterolemia    Hypertension    Hypothyroidism    Myocardial infarction (HCC) 2005   Seizures (HCC)    Sleep apnea    on CPAP 14 mm PS since February 2017   Stroke [facility]) 2007   weakness on R side   Vitamin D  deficiency     Past Surgical History:  Procedure Laterality Date   ABDOMINAL HYSTERECTOMY     BARIATRIC SURGERY     BIOPSY  09/17/2020   Procedure: BIOPSY;  Surgeon: Eartha Angelia Sieving, MD;  Location: [facility];  Service: Gastroenterology;;   BIOPSY  12/08/2022   Procedure: BIOPSY;  Surgeon: Eartha Angelia Sieving, MD;  Location: [facility];  Service: Gastroenterology;;   BREAST BIOPSY Right    beningn   CARDIAC CATHETERIZATION N/A 12/24/2015   Procedure: Right/Left Heart Cath and Coronary Angiography;  Surgeon: Candyce GORMAN Reek, MD;  Location: [facility] INVASIVE CV LAB;  Service: Cardiovascular;  Laterality: N/A;   COLONOSCOPY  2018   COLONOSCOPY WITH PROPOFOL  N/A 09/17/2020   Procedure: COLONOSCOPY WITH PROPOFOL ;  Surgeon: Eartha Angelia Sieving, MD;  Location: [facility];  Service: Gastroenterology;  Laterality: N/A;  9:00   ESOPHAGOGASTRODUODENOSCOPY (EGD) WITH PROPOFOL  N/A 09/17/2020   Procedure: ESOPHAGOGASTRODUODENOSCOPY (EGD) WITH PROPOFOL ;  Surgeon: Eartha Angelia Sieving, MD;  [facility]: [facility];  Service: Gastroenterology;  Laterality: N/A;   ESOPHAGOGASTRODUODENOSCOPY (EGD) WITH  PROPOFOL  N/A 12/08/2022   Procedure: ESOPHAGOGASTRODUODENOSCOPY (EGD) WITH PROPOFOL ;  Surgeon: Eartha Angelia Sieving, MD;  Location: [facility];  Service: Gastroenterology;  Laterality: N/A;  2:30PM;ASA 3   HERNIA REPAIR     LEFT HEART CATH AND CORONARY ANGIOGRAPHY N/A 07/12/2017   Procedure: LEFT HEART CATH AND CORONARY ANGIOGRAPHY;  Surgeon: Claudene Victory ORN, MD;  Location: [facility] INVASIVE CV LAB;  Service: Cardiovascular;  Laterality: N/A;    Social History   Socioeconomic History   Marital status: Married    Spouse name: Not on file   Number of children: 2   Years of education: Not on file   Highest education level: Not on file  Occupational History   Occupation: retired  Tobacco Use   Smoking status: Never    Passive exposure: Never   Smokeless tobacco: Never  Vaping Use   Vaping status: Never Used  Substance and Sexual Activity   Alcohol  use: Not Currently    Comment: occasionally   Drug use: No   Sexual activity: Never  Partners: Male  Other Topics Concern   Not on file  Social History Narrative   Living with husband, 2 story home, 10 steps   Right hand   Coffee 1 cup a day   Social Drivers of Health   Tobacco Use: Low Risk (05/07/2024)   Patient History    Smoking Tobacco Use: Never    Smokeless Tobacco Use: Never    Passive Exposure: Never  Financial Resource Strain: Low Risk (11/30/2022)   Overall Financial Resource Strain (CARDIA)    Difficulty of Paying Living Expenses: Not hard at all  Food Insecurity: No Food Insecurity (10/21/2023)   Epic    Worried About Radiation Protection Practitioner of Food in the Last Year: Never true    Ran Out of Food in the Last Year: Never true  Transportation Needs: No Transportation Needs (10/21/2023)   Epic    Lack of Transportation (Medical): No    Lack of Transportation (Non-Medical): No  Physical Activity: Inactive (11/30/2022)   Exercise Vital Sign    Days of Exercise per Week: 0 days    Minutes of Exercise per Session: 0 min  Stress:  No Stress Concern Present (11/30/2022)   [employer] of Occupational Health - Occupational Stress Questionnaire    Feeling of Stress : Not at all  Social Connections: Moderately Isolated (10/21/2023)   Social Connection and Isolation Panel    Frequency of Communication with Friends and Family: More than three times a week    Frequency of Social Gatherings with Friends and Family: Three times a week    Attends Religious Services: Never    Active Member of Clubs or Organizations: No    Attends Banker Meetings: Never    Marital Status: Married  Depression (PHQ2-9): Medium Risk (02/11/2024)   Depression (PHQ2-9)    PHQ-2 Score: 10  Alcohol  Screen: Low Risk (11/30/2022)   Alcohol  Screen    Last Alcohol  Screening Score (AUDIT): 0  Housing: Low Risk (10/21/2023)   Epic    Unable to Pay for Housing in the Last Year: No    Number of Times Moved in the Last Year: 0    Homeless in the Last Year: No  Utilities: Not At Risk (10/21/2023)   Epic    Threatened with loss of utilities: No  Health Literacy: Adequate Health Literacy (11/30/2022)   B1300 Health Literacy    Frequency of need for help with medical instructions: Never    Family History  Problem Relation Age of Onset   Cancer Mother    Colon cancer Mother    Diabetes Mother    Hypertension Father    Thyroid  disease Father    Thyroid  cancer Father    Diabetes Father    Esophageal cancer Father    Breast cancer Maternal Aunt    Colon cancer Maternal Aunt    Breast cancer Maternal Aunt    Colon cancer Maternal Grandmother    Stroke Maternal Grandfather    Stomach cancer Paternal Grandfather    Colon cancer Son    Colon polyps Neg Hx    Rectal cancer Neg Hx     Outpatient Encounter Medications as of 05/07/2024  Medication Sig   ACCU-CHEK AVIVA PLUS test strip Inject 1 strip as directed daily.   Accu-Chek Softclix Lancets lancets Use as instructed to monitor glucose twice daily   acetaminophen  (TYLENOL ) 500 MG  tablet Take 500 mg by mouth every 6 (six) hours as needed for mild pain or headache. As needed.  albuterol  (VENTOLIN  HFA) 108 (90 Base) MCG/ACT inhaler Inhale 2 puffs into the lungs every 6 (six) hours as needed for wheezing or shortness of breath.   amLODipine  (NORVASC ) 5 MG tablet Take 1 tablet (5 mg total) by mouth daily.   aspirin  325 MG tablet Take 325 mg by mouth daily.   Blood Glucose Monitoring Suppl (ACCU-CHEK GUIDE ME) w/Device KIT Use to check glucose twice daily   calcitRIOL (ROCALTROL) 0.25 MCG capsule Take 0.25 mcg by mouth daily.   Continuous Glucose Sensor (DEXCOM G7 SENSOR) MISC by Does not apply route.   EPINEPHrine 0.3 mg/0.3 mL IJ SOAJ injection Inject 0.3 mg into the muscle as needed for anaphylaxis.   furosemide  (LASIX ) 20 MG tablet TAKE 1 TABLET BY MOUTH DAILY.  (MAY TAKE EXTRA 1 TABLET AS  NEEDED FOR SEVERE SWELLING)   glucose blood (ACCU-CHEK GUIDE TEST) test strip Use as instructed to monitor glucose twice daily   insulin  glargine, 2 Unit Dial, (TOUJEO  MAX SOLOSTAR) 300 UNIT/ML Solostar Pen Inject 66 Units into the skin at bedtime.   lacosamide  (VIMPAT ) 50 MG TABS tablet After increasing dose as instructed, take 1 tablet in AM, 2 tablets in PM   Lancets Thin MISC Inject 1 Stick into the skin daily.   levETIRAcetam  (KEPPRA ) 250 MG tablet Take 1 tablet twice a day   levothyroxine  (SYNTHROID ) 88 MCG tablet TAKE 1 TABLET BY MOUTH DAILY  BEFORE BREAKFAST   MOUNJARO  15 MG/0.5ML Pen INJECT THE CONTENTS OF ONE PEN  SUBCUTANEOUSLY WEEKLY AS  DIRECTED   Multiple Vitamins-Minerals (MULTIVITAMIN ADULT) CHEW Chew 1 tablet by mouth daily.   NOVOFINE PEN NEEDLE 32G X 6 MM MISC USE AS DIRECTED TO INJECT  INSULIN  4 TIMES DAILY   olmesartan  (BENICAR ) 20 MG tablet TAKE 1 TABLET BY MOUTH DAILY   potassium chloride  (KLOR-CON  M) 10 MEQ tablet TAKE 1 TABLET BY MOUTH DAILY   pyridOXINE (VITAMIN B6) 50 MG tablet Take 1 tablet (50 mg total) by mouth daily.   simvastatin  (ZOCOR ) 20 MG tablet  TAKE 1 TABLET BY MOUTH AT  BEDTIME   terbinafine  (LAMISIL  AT) 1 % cream Apply 1 Application topically 2 (two) times daily.   thiamine  (VITAMIN B-1) 100 MG tablet Take 1 tablet (100 mg total) by mouth daily.   Vibegron  (GEMTESA ) 75 MG TABS Take 1 tablet (75 mg total) by mouth daily.   omeprazole  (PRILOSEC) 40 MG capsule TAKE 1 CAPSULE (40 MG TOTAL) BY MOUTH DAILY. (Patient not taking: Reported on 05/07/2024)   No facility-administered encounter medications on file as of 05/07/2024.    ALLERGIES: Allergies  Allergen Reactions   Chocolate Other (See Comments)    Pt and daughter report it causes dark discoloration on elbows.    Peanut-Containing Drug Products Rash and Dermatitis   Penicillins Rash    Has patient had a PCN reaction causing immediate rash, facial/tongue/throat swelling, SOB or lightheadedness with hypotension: Yes Has patient had a PCN reaction causing severe rash involving mucus membranes or skin necrosis: No Has patient had a PCN reaction that required hospitalization No Has patient had a PCN reaction occurring within the last 10 years: No. Has not had in 40-50 years If all of the above answers are NO, then may proceed with Cephalosporin use.    VACCINATION STATUS: Immunization History  Administered Date(s) Administered   Moderna Sars-Covid-2 Vaccination 07/11/2019, 08/08/2019, 08/11/2020   PNEUMOCOCCAL CONJUGATE-20 02/21/2022   Zoster Recombinant(Shingrix) 08/17/2022, 10/02/2022    Diabetes She presents for her follow-up diabetic visit. She has  type 2 diabetes mellitus. Onset time: Diagnosed at approx age of 50. Her disease course has been improving. There are no hypoglycemic associated symptoms. Associated symptoms include fatigue and foot paresthesias. Pertinent negatives for diabetes include no polydipsia, no polyuria and no weight loss. There are no hypoglycemic complications. Symptoms are improving. Diabetic complications include a CVA, heart disease, nephropathy and  peripheral neuropathy. Risk factors for coronary artery disease include diabetes mellitus, dyslipidemia, family history, obesity, hypertension, post-menopausal and sedentary lifestyle. Current diabetic treatment includes insulin  injections (and Mounjaro ). She is compliant with treatment most of the time. Her weight is decreasing steadily. She is following a generally healthy diet. When asked about meal planning, she reported none. She has not had a previous visit with a dietitian. She rarely participates in exercise. Her home blood glucose trend is decreasing steadily. Her breakfast blood glucose range is generally 110-130 mg/dl. Her overall blood glucose range is 140-180 mg/dl. (She presents today, accompanied by her husband, with her CGM showing mostly at target glycemic profile.  Her most recent A1c on 11/20 was 7.5%, improving from last visit of 8.2%.  Analysis of her CGM shows TIR 66%, TAR 34%, TBR 0% with a GMI of 7.2%.  She tends to stay up late and eat a snack before bed to prevent glucose from dropping while she sleeps.) An ACE inhibitor/angiotensin II receptor blocker is being taken. She does not see a podiatrist.Eye exam is current.  Hypertension This is a chronic problem. The current episode started more than 1 year ago. The problem has been resolved since onset. The problem is controlled. Agents associated with hypertension include thyroid  hormones. Risk factors for coronary artery disease include sedentary lifestyle, obesity, post-menopausal state, dyslipidemia, diabetes mellitus and family history. Past treatments include angiotensin blockers and diuretics. The current treatment provides moderate improvement. Compliance problems include diet and exercise.  Hypertensive end-organ damage includes kidney disease, CAD/MI and CVA. Identifiable causes of hypertension include a thyroid  problem.     Review of systems  Constitutional: + stable body weight, current Body mass index is 51.4 kg/m., +  fatigue, no subjective hyperthermia, no subjective hypothermia Eyes: no blurry vision, no xerophthalmia ENT: no sore throat, no nodules palpated in throat, no dysphagia/odynophagia, no hoarseness Cardiovascular: no chest pain, no shortness of breath, no palpitations, + leg swelling Respiratory: no cough, + shortness of breath with exertion Gastrointestinal: no nausea/vomiting/diarrhea Musculoskeletal: no muscle/joint aches, right side weakness from previous CVA Skin: no rashes, no hyperemia Neurological: no tremors, no dizziness Psychiatric: + significant depression, no anxiety  Objective:     BP 132/80 (BP Location: Left Arm, Patient Position: Sitting, Cuff Size: Large)   Pulse 92   Ht 5' (1.524 m)   Wt 263 lb 3.2 oz (119.4 kg)   BMI 51.40 kg/m   Wt Readings from Last 3 Encounters:  05/07/24 263 lb 3.2 oz (119.4 kg)  02/11/24 263 lb 1.3 oz (119.3 kg)  01/30/24 265 lb (120.2 kg)     BP Readings from Last 3 Encounters:  05/07/24 132/80  02/11/24 (!) 150/85  02/06/24 119/73     Physical Exam- Limited  Constitutional:  Body mass index is 51.4 kg/m. , not in acute distress, normal state of mind Eyes:  EOMI, no exophthalmos Musculoskeletal: minimal right sided weakness- residual from previous stroke Skin:  no rashes, no hyperemia Neurological: no tremor with outstretched hands  Diabetic Foot Exam - Simple   No data filed     CMP ( most recent) CMP  Component Value Date/Time   NA 142 03/20/2024 1105   K 5.2 03/20/2024 1105   CL 105 03/20/2024 1105   CO2 21 03/20/2024 1105   GLUCOSE 83 03/20/2024 1105   GLUCOSE 203 (H) 10/26/2023 0423   BUN 31 (H) 03/20/2024 1105   CREATININE 2.07 (H) 03/20/2024 1105   CALCIUM  9.3 03/20/2024 1105   PROT 6.6 03/20/2024 1105   ALBUMIN  4.3 03/20/2024 1105   AST 21 03/20/2024 1105   ALT 13 03/20/2024 1105   ALKPHOS 108 03/20/2024 1105   BILITOT <0.2 03/20/2024 1105   GFRNONAA 23 (L) 10/26/2023 0423   GFRAA 48 (L) 07/09/2017  1112     Diabetic Labs (most recent): Lab Results  Component Value Date   HGBA1C 7.5 (H) 03/20/2024   HGBA1C 8.2 (H) 10/15/2023   HGBA1C 9.1 (H) 06/18/2023   MICROALBUR 150 07/11/2021     Lipid Panel ( most recent) Lipid Panel     Component Value Date/Time   CHOL 146 03/20/2024 1105   TRIG 175 (H) 03/20/2024 1105   HDL 46 03/20/2024 1105   CHOLHDL 3.2 03/20/2024 1105   LDLCALC 70 03/20/2024 1105   LABVLDL 30 03/20/2024 1105      Lab Results  Component Value Date   TSH 2.870 03/20/2024   TSH 1.256 10/20/2023   TSH 1.300 10/15/2023   TSH 1.600 06/18/2023   TSH 1.830 02/21/2023   TSH 3.020 11/27/2022   TSH 3.330 09/06/2022   TSH 3.880 04/17/2022   TSH 5.750 (H) 11/04/2021   TSH 1.533 09/29/2020   FREET4 1.32 03/20/2024   FREET4 1.25 10/15/2023   FREET4 1.37 06/18/2023   FREET4 1.35 02/21/2023   FREET4 1.29 11/27/2022   FREET4 1.23 09/06/2022   FREET4 1.50 04/17/2022   FREET4 1.06 11/04/2021           Assessment & Plan:   1) Type 2 diabetes mellitus with stage 3b chronic kidney disease, with long-term current use of insulin  (HCC)  She presents today, accompanied by her husband, with her CGM showing mostly at target glycemic profile.  Her most recent A1c on 11/20 was 7.5%, improving from last visit of 8.2%.  Analysis of her CGM shows TIR 66%, TAR 34%, TBR 0% with a GMI of 7.2%.  She tends to stay up late and eat a snack before bed to prevent glucose from dropping while she sleeps.  - Ritta  CHRISTELLA Strickland has currently uncontrolled symptomatic type 2 DM since 79 years of age.   -Recent labs reviewed.  - I had a long discussion with her about the progressive nature of diabetes and the pathology behind its complications. -her diabetes is complicated by CVA, MI, CAD, OSA, CKD and she remains at a high risk for more acute and chronic complications which include CAD, CVA, CKD, retinopathy, and neuropathy. These are all discussed in detail with her.  The following  Lifestyle Medicine recommendations according to American College of Lifestyle Medicine [facility]) were discussed and offered to patient and she agrees to start the journey:  A. Whole Foods, Plant-based plate comprising of fruits and vegetables, plant-based proteins, whole-grain carbohydrates was discussed in detail with the patient.   A list for source of those nutrients were also provided to the patient.  Patient will use only water or unsweetened tea for hydration. B.  The need to stay away from risky substances including alcohol , smoking; obtaining 7 to 9 hours of restorative sleep, at least 150 minutes of moderate intensity exercise weekly, the importance of healthy  social connections,  and stress reduction techniques were discussed. C.  A full color page of  Calorie density of various food groups per pound showing examples of each food groups was provided to the patient.  - Nutritional counseling repeated/built upon at each appointment.  - The patient admits there is a room for improvement in their diet and drink choices. -  Suggestion is made for the patient to avoid simple carbohydrates from their diet including Cakes, Sweet Desserts / Pastries, Ice Cream, Soda (diet and regular), Sweet Tea, Candies, Chips, Cookies, Sweet Pastries, Store Bought Juices, Alcohol  in Excess of 1-2 drinks a day, Artificial Sweeteners, Coffee Creamer, and Sugar-free Products. This will help patient to have stable blood glucose profile and potentially avoid unintended weight gain.   - I encouraged the patient to switch to unprocessed or minimally processed complex starch and increased protein intake (animal or plant source), fruits, and vegetables.   - Patient is advised to stick to a routine mealtimes to eat 3 meals a day and avoid unnecessary snacks (to snack only to correct hypoglycemia).  - she will be scheduled with Santana Duke, RDN, CDE for diabetes education.  - I have approached her with the following  individualized plan to manage her diabetes and patient agrees:   -She is advised to lower Tresiba /Toujeo  to 50 units SQ nightly and continue Mounjaro  15 mg SQ weekly.  Trying to prevent her from needing night time snack.  -she is encouraged to continue monitoring glucose 4 times daily (using her CGM), before meals and before bed, and to call the clinic if she has readings less than 70 or above 300 for 3 tests in a row.    - she is warned not to take insulin  without proper monitoring per orders. - Adjustment parameters are given to her for hypo and hyperglycemia in writing.  - her Metformin  was discontinued, risk outweighs benefit for this patient given recent renal decline.  - Specific targets for  A1c; LDL, HDL, and Triglycerides were discussed with the patient.  2) Blood Pressure /Hypertension:  her blood pressure is controlled to target for her age.   she is advised to continue her current medications as prescribed by her PCP.  3) Lipids/Hyperlipidemia:    Her most recent lipid panel from 03/20/24 shows controlled LDL of 70 and elevated triglycerides of 175 (improving).  She is advised to continue Zocor  20 mg po daily at bedtime.    4)  Weight/Diet:  her Body mass index is 51.4 kg/m.  -  clearly complicating her diabetes care.   she is a candidate for weight loss. I discussed with her the fact that loss of 5 - 10% of her  current body weight will have the most impact on her diabetes management.  Exercise, and detailed carbohydrates information provided  -  detailed on discharge instructions.  5) Hypothyroidism There are no recent TFTs to review.  She is advised to continue Levothyroxine  88 mcg po daily before breakfast.  Will recheck TFTs prior to next visit and adjust dose accordingly.  The correct intake of thyroid  hormone (Levothyroxine , Synthroid ), is on empty stomach first thing in the morning, with water, separated by at least 30 minutes from breakfast and other medications,  and  separated by more than 4 hours from calcium , iron, multivitamins, acid reflux medications (PPIs).  - This medication is a life-long medication and will be needed to correct thyroid  hormone imbalances for the rest of your life.  The dose may change  from time to time, based on thyroid  blood work.  - It is extremely important to be consistent taking this medication, near the same time each morning.  -AVOID TAKING PRODUCTS CONTAINING BIOTIN (commonly found in Hair, Skin, Nails vitamins) AS IT INTERFERES WITH THE VALIDITY OF THYROID  FUNCTION BLOOD TESTS.  6) Chronic Care/Health Maintenance: -she is on ACEI/ARB and not on Statin medications and is encouraged to initiate and continue to follow up with Ophthalmology, Dentist, Podiatrist at least yearly or according to recommendations, and advised to stay away from smoking. I have recommended yearly flu vaccine and pneumonia vaccine at least every 5 years; moderate intensity exercise for up to 150 minutes weekly; and sleep for at least 7 hours a day.  - she is advised to maintain close follow up with Bacchus, Meade PEDLAR, FNP for primary care needs, as well as her other providers for optimal and coordinated care.     I spent  48  minutes in the care of the patient today including review of labs from CMP, Lipids, Thyroid  Function, Hematology (current and previous including abstractions from other facilities); face-to-face time discussing  her blood glucose readings/logs, discussing hypoglycemia and hyperglycemia episodes and symptoms, medications doses, her options of short and long term treatment based on the latest standards of care / guidelines;  discussion about incorporating lifestyle medicine;  and documenting the encounter. Risk reduction counseling performed per USPSTF guidelines to reduce obesity and cardiovascular risk factors.     Please refer to Patient Instructions for Blood Glucose Monitoring and Insulin /Medications Dosing Guide  in media tab for  additional information. Please  also refer to  Patient Self Inventory in the Media  tab for reviewed elements of pertinent patient history.  Vinia  LINETTA REGNER participated in the discussions, expressed understanding, and voiced agreement with the above plans.  All questions were answered to her satisfaction. she is encouraged to contact clinic should she have any questions or concerns prior to her return visit.    Follow up plan: - Return in about 4 months (around 09/04/2024) for Diabetes F/U with A1c in office, Thyroid  follow up, Previsit labs, Bring meter and logs.  Benton Rio, [facility] [address] [place], [name] [address] Phone: [phone] Fax: [phone]  05/07/2024, 1:58 PM    "

## 2024-05-28 ENCOUNTER — Ambulatory Visit: Admitting: Neurology

## 2024-06-13 ENCOUNTER — Ambulatory Visit: Admitting: Family Medicine

## 2024-06-16 ENCOUNTER — Ambulatory Visit: Admitting: Family Medicine

## 2024-08-21 ENCOUNTER — Ambulatory Visit: Admitting: Neurology

## 2024-09-04 ENCOUNTER — Ambulatory Visit: Admitting: Nurse Practitioner

## 2024-12-01 ENCOUNTER — Ambulatory Visit: Payer: Self-pay | Admitting: Neurology
